# Patient Record
Sex: Male | Born: 1940
Health system: Southern US, Community
[De-identification: ages and names within clinical notes are randomized; demographics above are authoritative.]

## PROBLEM LIST (undated history)

## (undated) DIAGNOSIS — E7800 Pure hypercholesterolemia, unspecified: Secondary | ICD-10-CM

## (undated) DIAGNOSIS — F329 Major depressive disorder, single episode, unspecified: Secondary | ICD-10-CM

## (undated) DIAGNOSIS — E039 Hypothyroidism, unspecified: Secondary | ICD-10-CM

## (undated) DIAGNOSIS — I10 Essential (primary) hypertension: Secondary | ICD-10-CM

## (undated) DIAGNOSIS — K219 Gastro-esophageal reflux disease without esophagitis: Secondary | ICD-10-CM

## (undated) DIAGNOSIS — F32A Depression, unspecified: Secondary | ICD-10-CM

## (undated) HISTORY — PX: HEMORRHOID SURGERY: SHX153

---

## 2011-08-28 ENCOUNTER — Emergency Department (HOSPITAL_COMMUNITY): Payer: Medicare Other

## 2011-08-28 ENCOUNTER — Encounter: Payer: Self-pay | Admitting: Emergency Medicine

## 2011-08-28 ENCOUNTER — Emergency Department (HOSPITAL_COMMUNITY)
Admission: EM | Admit: 2011-08-28 | Discharge: 2011-08-29 | Disposition: A | Discharge status: Home / Self Care | Payer: Medicare Other | Attending: Emergency Medicine | Admitting: Emergency Medicine

## 2011-08-28 DIAGNOSIS — E7800 Pure hypercholesterolemia, unspecified: Secondary | ICD-10-CM | POA: Insufficient documentation

## 2011-08-28 DIAGNOSIS — S27329A Contusion of lung, unspecified, initial encounter: Secondary | ICD-10-CM

## 2011-08-28 DIAGNOSIS — F3289 Other specified depressive episodes: Secondary | ICD-10-CM | POA: Insufficient documentation

## 2011-08-28 DIAGNOSIS — W19XXXA Unspecified fall, initial encounter: Secondary | ICD-10-CM

## 2011-08-28 DIAGNOSIS — Z79899 Other long term (current) drug therapy: Secondary | ICD-10-CM | POA: Insufficient documentation

## 2011-08-28 DIAGNOSIS — I10 Essential (primary) hypertension: Secondary | ICD-10-CM | POA: Insufficient documentation

## 2011-08-28 DIAGNOSIS — F329 Major depressive disorder, single episode, unspecified: Secondary | ICD-10-CM | POA: Insufficient documentation

## 2011-08-28 DIAGNOSIS — R079 Chest pain, unspecified: Secondary | ICD-10-CM | POA: Insufficient documentation

## 2011-08-28 DIAGNOSIS — S0990XA Unspecified injury of head, initial encounter: Secondary | ICD-10-CM | POA: Insufficient documentation

## 2011-08-28 DIAGNOSIS — R61 Generalized hyperhidrosis: Secondary | ICD-10-CM | POA: Insufficient documentation

## 2011-08-28 DIAGNOSIS — M542 Cervicalgia: Secondary | ICD-10-CM | POA: Insufficient documentation

## 2011-08-28 DIAGNOSIS — W11XXXA Fall on and from ladder, initial encounter: Secondary | ICD-10-CM | POA: Insufficient documentation

## 2011-08-28 DIAGNOSIS — R109 Unspecified abdominal pain: Secondary | ICD-10-CM | POA: Insufficient documentation

## 2011-08-28 DIAGNOSIS — E039 Hypothyroidism, unspecified: Secondary | ICD-10-CM | POA: Insufficient documentation

## 2011-08-28 DIAGNOSIS — Y93H2 Activity, gardening and landscaping: Secondary | ICD-10-CM | POA: Insufficient documentation

## 2011-08-28 HISTORY — DX: Pure hypercholesterolemia, unspecified: E78.00

## 2011-08-28 HISTORY — DX: Essential (primary) hypertension: I10

## 2011-08-28 HISTORY — DX: Major depressive disorder, single episode, unspecified: F32.9

## 2011-08-28 HISTORY — DX: Depression, unspecified: F32.A

## 2011-08-28 HISTORY — DX: Hypothyroidism, unspecified: E03.9

## 2011-08-28 LAB — CBC
HCT: 42.8 % (ref 39.0–52.0)
Hemoglobin: 13.2 g/dL (ref 13.0–17.0)
MCH: 27.6 pg (ref 26.0–34.0)
MCHC: 30.8 g/dL (ref 30.0–36.0)
MCV: 89.4 fL (ref 78.0–100.0)
Platelets: 150 10*3/uL (ref 150–400)
RBC: 4.79 MIL/uL (ref 4.22–5.81)
RDW: 13.7 % (ref 11.5–15.5)
WBC: 12.1 10*3/uL — ABNORMAL HIGH (ref 4.0–10.5)

## 2011-08-28 LAB — POCT I-STAT, CHEM 8
BUN: 21 mg/dL (ref 6–23)
Calcium, Ion: 1.14 mmol/L (ref 1.12–1.32)
Chloride: 101 mEq/L (ref 96–112)
Creatinine, Ser: 1.2 mg/dL (ref 0.50–1.35)
Glucose, Bld: 98 mg/dL (ref 70–99)
HCT: 45 % (ref 39.0–52.0)
Hemoglobin: 15.3 g/dL (ref 13.0–17.0)
Potassium: 3.5 mEq/L (ref 3.5–5.1)
Sodium: 139 mEq/L (ref 135–145)
TCO2: 27 mmol/L (ref 0–100)

## 2011-08-28 IMAGING — CR DG CHEST 1V PORT
1 series · 1 of 1 positions shown · non-contrast
Comparison: None.

CLINICAL DATA: Fall.

PORTABLE CHEST - 1 VIEW

[AP]
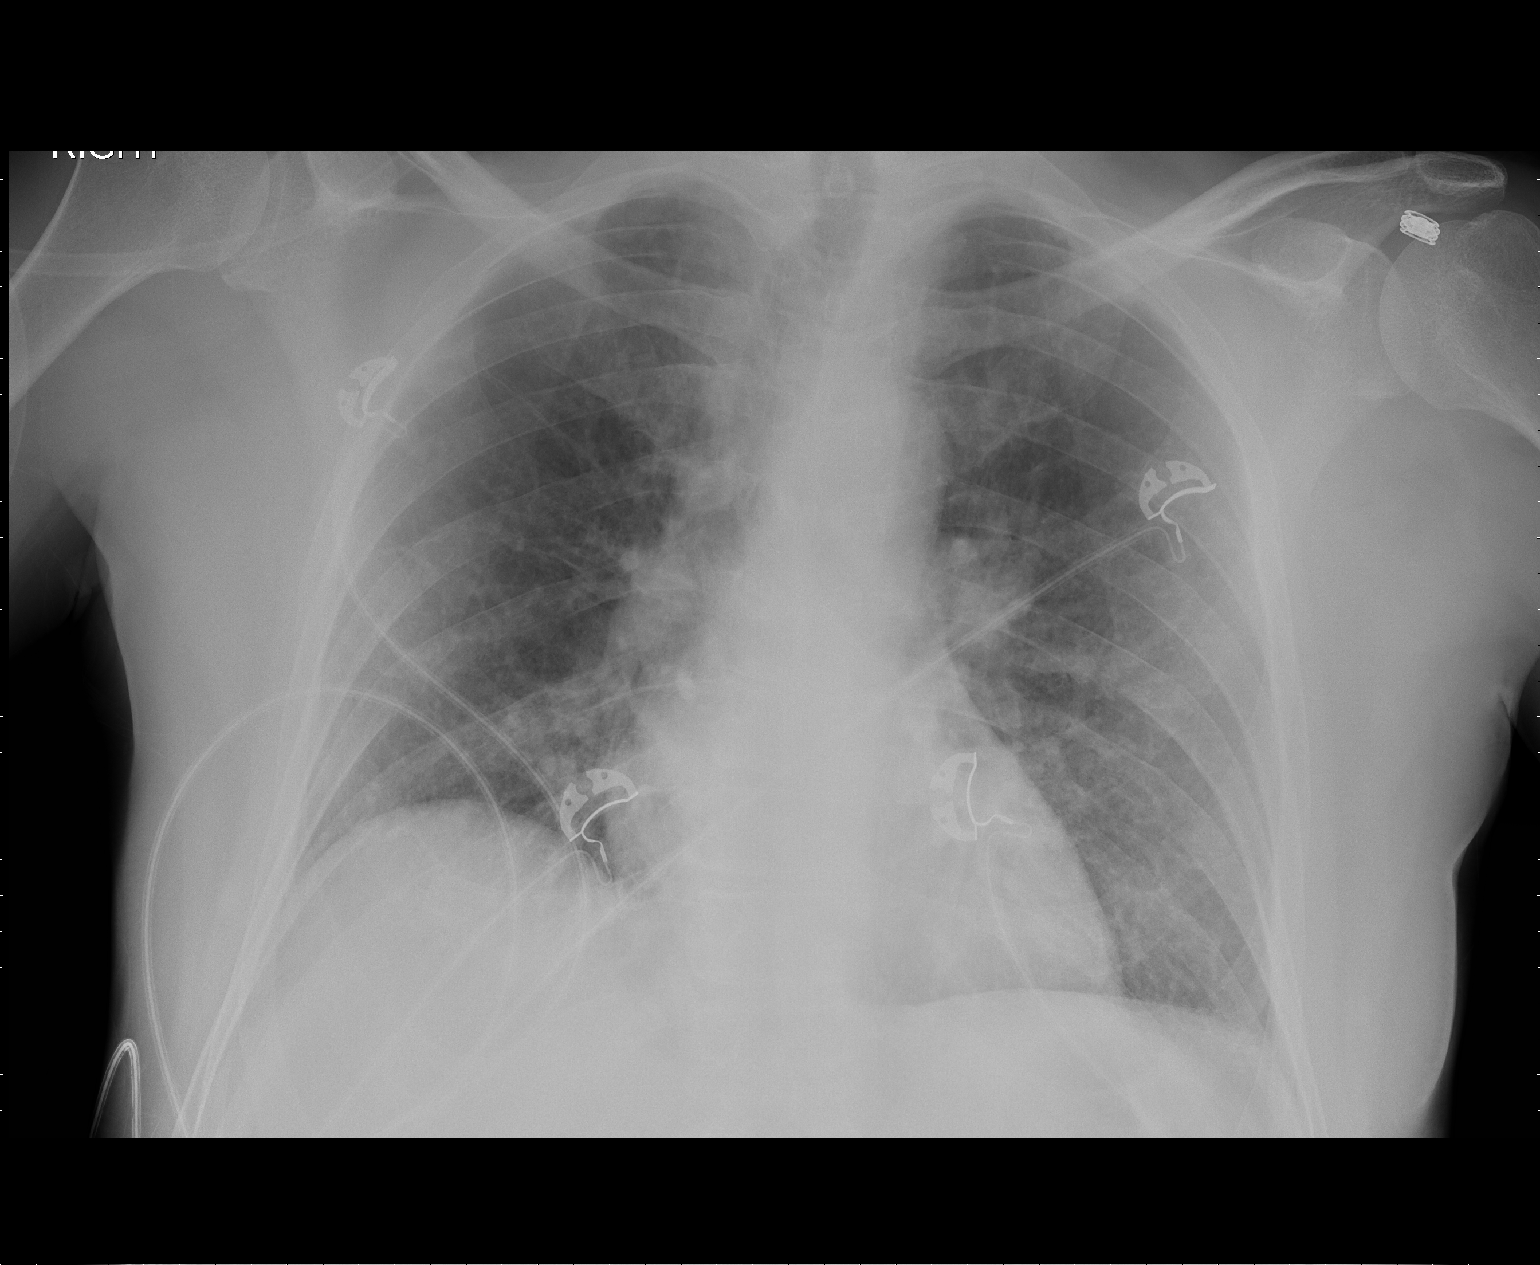

[1 of 1 positions shown; findings below may reference images not displayed]

FINDINGS: Lungs are clear.  Heart size is normal.  No pneumothorax
or effusion.
IMPRESSION: No acute disease.

## 2011-08-28 IMAGING — CT CT HEAD W/O CM
3 of 5 series · 15 of 47 positions shown, 18 images · non-contrast
Comparison: None

CT HEAD

CLINICAL DATA: The patient fell backwards, striking the back and
posterior head.  Possible loss of consciousness.  Back pain.

CT HEAD WITHOUT CONTRAST
CT CERVICAL SPINE WITHOUT CONTRAST
TECHNIQUE: Multidetector CT imaging of the head and cervical spine
was performed following the standard protocol without intravenous
contrast.  Multiplanar CT image reconstructions of the cervical
spine were also generated.

[Series 602: cor · coronal · 0.39mm/px · 3 of 58 slices shown]
[im 20/58  brain]
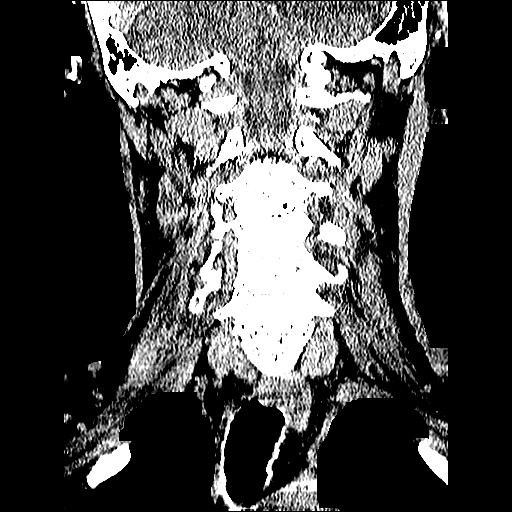
[im 26/58  brain]
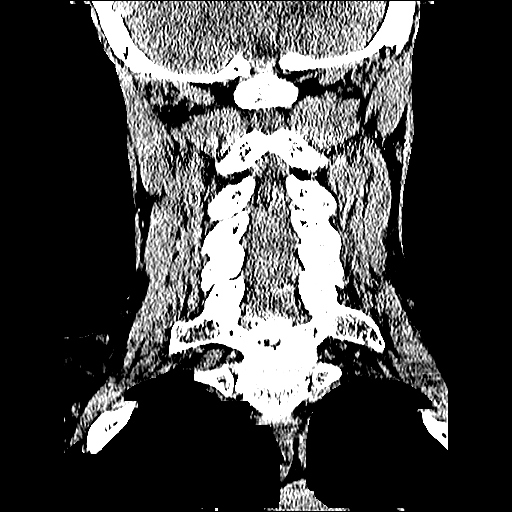
[im 32/58  brain]
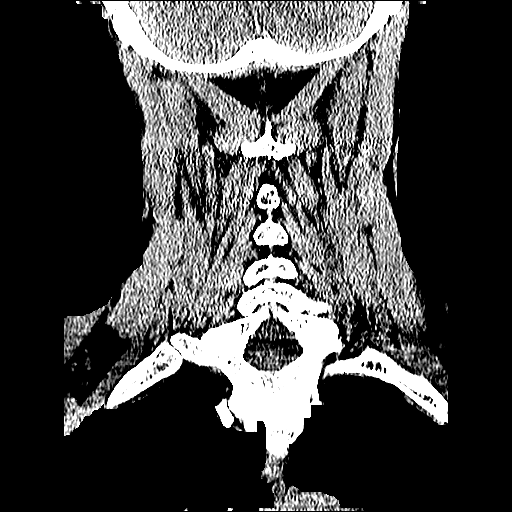

[Series 603: sag · sagittal · 0.39mm/px · 3 of 58 slices shown]
[im 20/58  brain]
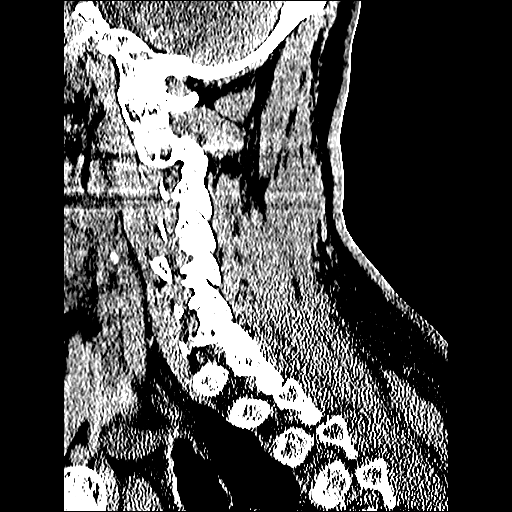
[im 29/58  brain]
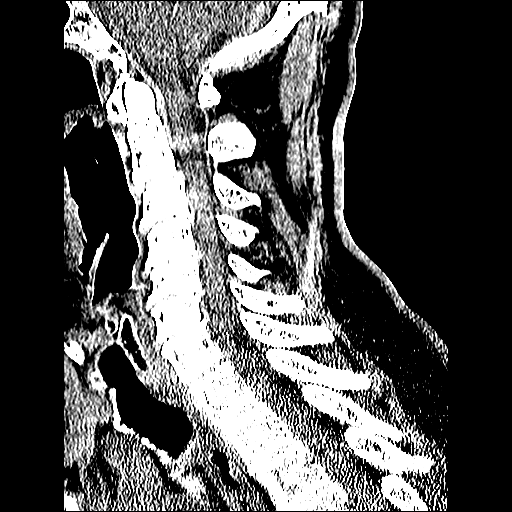
[im 39/58  brain]
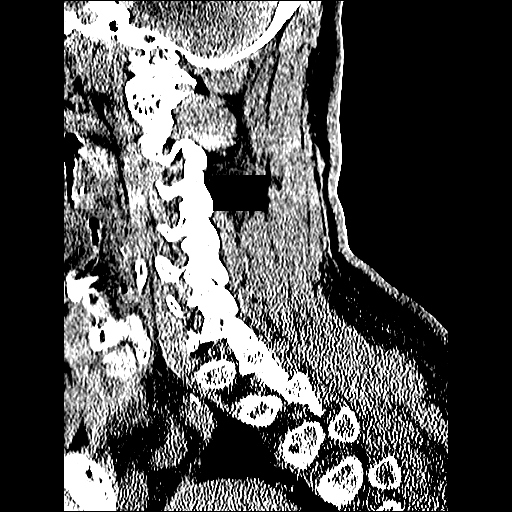

[Series 604: axial · axial · 0.39mm/px · z∈[-73,+66]mm · 9 of 91 slices shown, 12 images]
[im 9/91  brain]
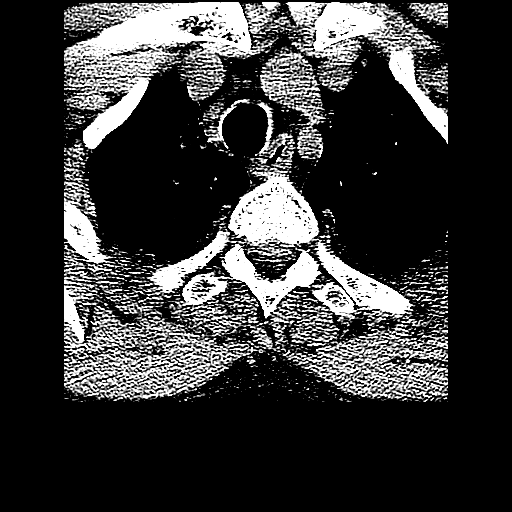
[im 9/91  bone]
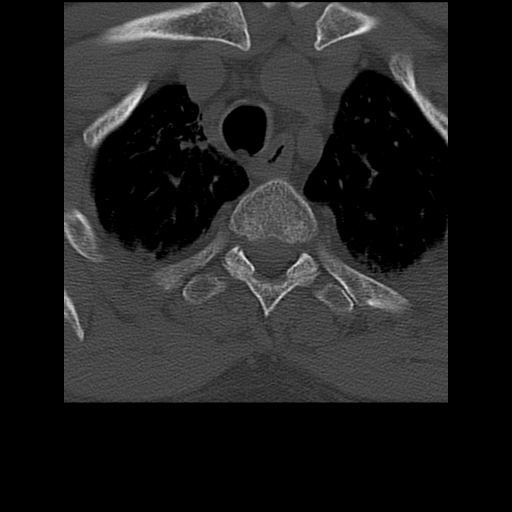
[im 17/91  brain]
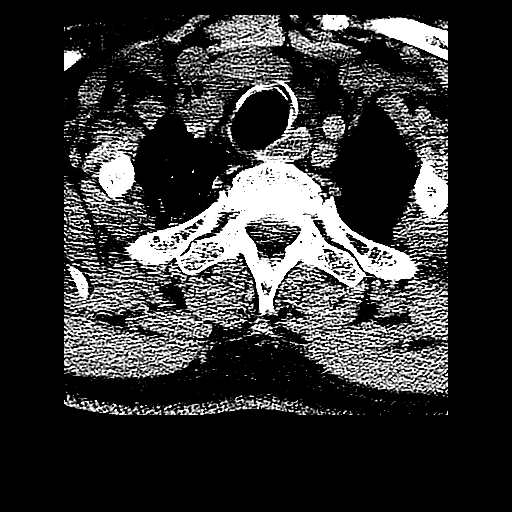
[im 25/91  brain]
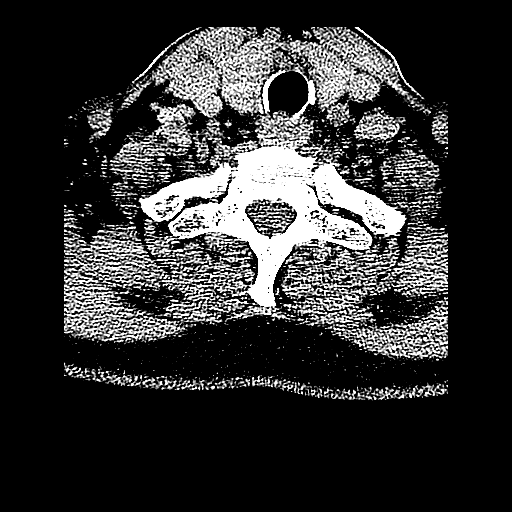
[im 33/91  brain]
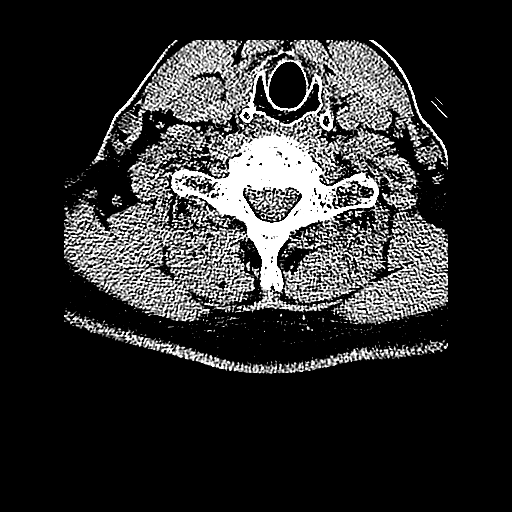
[im 50/91  brain]
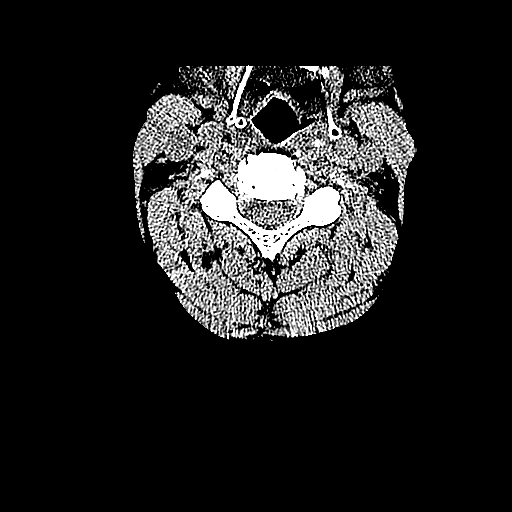
[im 50/91  bone]
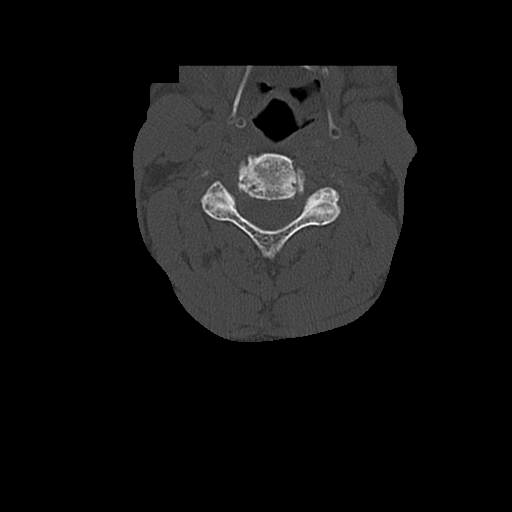
[im 58/91  brain]
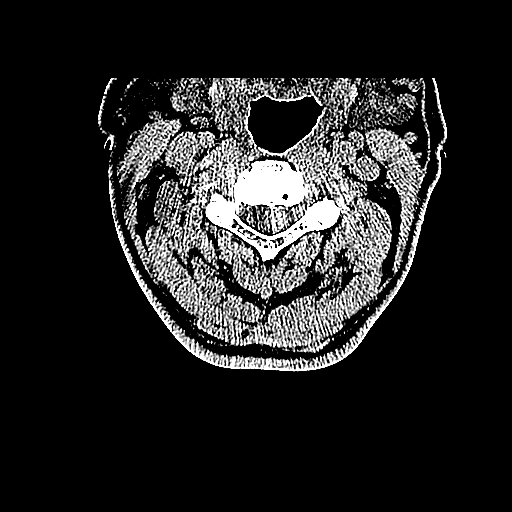
[im 66/91  brain]
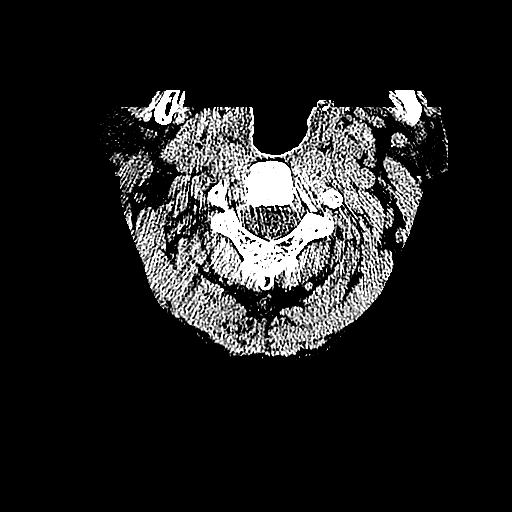
[im 74/91  brain]
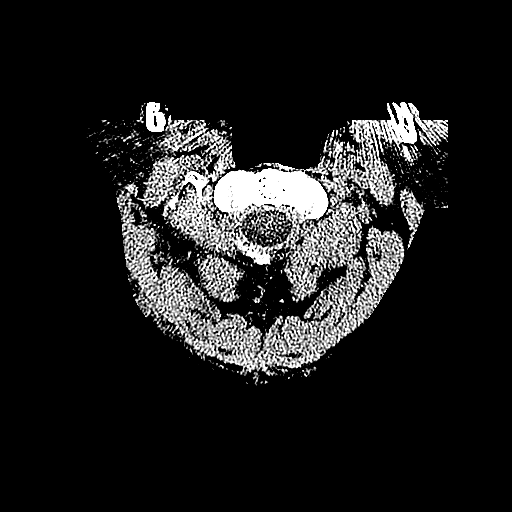
[im 82/91  brain]
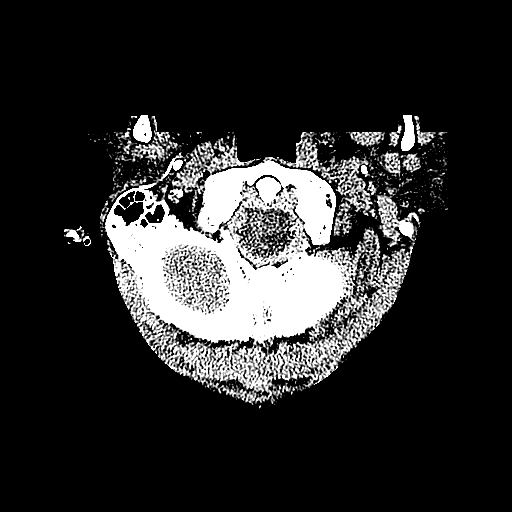
[im 82/91  bone]
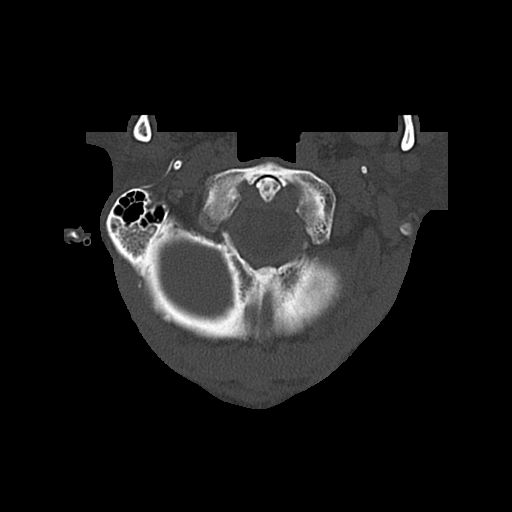

[15 of 47 positions shown; findings below may reference images not displayed]

FINDINGS: Mild cerebral atrophy.  Low attenuation changes in the
deep white matter suggesting small vessel ischemic change.  No
ventricular dilatation.  No mass effect or midline shift.  No
abnormal extra-axial fluid collections.  Gray-white matter
junctions are distinct.  Basal cisterns are not effaced.  No
evidence of acute intracranial hemorrhage.  Calcification in the
intracranial arteries.  No depressed skull fractures visualized.  5
mm calcification along the inner table of the left frontal skull
may represent a small meningioma or bone hypertroph.  Visualized
paranasal sinuses and mastoid air cells are not opacified.
IMPRESSION: Chronic atrophy and small vessel ischemic changes.  No evidence of
acute intracranial hemorrhage, mass lesion, or acute infarct.

CT CERVICAL SPINE
FINDINGS: Degenerative changes in the cervical spine with narrowed
cervical interspaces and endplate hypertrophy at C3-4, C4-5, C5-6,
and C6-7 levels.  Mild loss of cervical lordosis at these levels,
likely degenerative.  No abnormal anterior subluxation.  The
posterior elements and facet joints are well-aligned.  Lateral
masses of C1 are symmetrical.  The odontoid process appears intact.
No vertebral compression deformities.  No prevertebral soft tissue
swelling.  No significant paraspinal soft tissue infiltration.
Bone cortex and trabecular architecture appear intact. Vascular
calcifications in the carotid arteries. Interstitial changes in the
lung apices which may represent scarring or edema.  Prominent
thickening of the wall of the visualized upper esophagus which
might represent changes of esophagitis.
IMPRESSION: Degenerative changes in the cervical spine.  No displaced fractures
identified.  The interstitial fibrosis or edema in the lung apices.
Esophageal wall thickening suggesting esophagitis.

## 2011-08-28 IMAGING — CT CT CHEST W/ CM
2 of 5 series · 16 of 46 positions shown, 18 images · IV contrast (APPLIED)
Comparison: None

CT CHEST

CLINICAL DATA: Trauma.  The patient fell backwards, hitting his
back in the posterior head.  Complains of back pain.

CT CHEST, ABDOMEN AND PELVIS WITH CONTRAST
TECHNIQUE: Multidetector CT imaging of the chest, abdomen and
pelvis was performed following the standard protocol during bolus
administration of intravenous contrast.
Contrast:  1 year mL [RL]

[Series 2: c/a/p 5.0 b31f · axial · 0.70mm/px · z∈[-606,-61]mm · 13 of 123 slices shown, 15 images]
[im 7/123  soft-tissue]
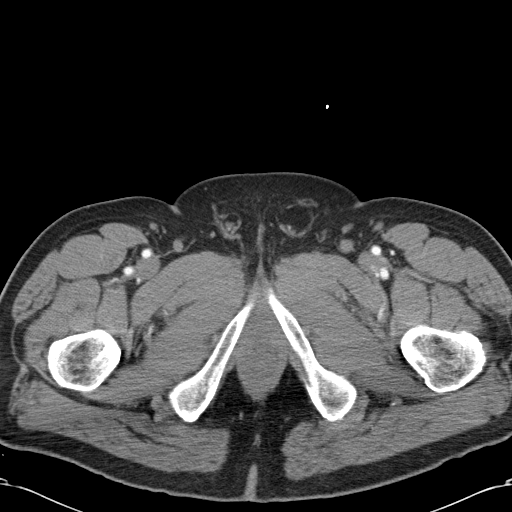
[im 7/123  bone]
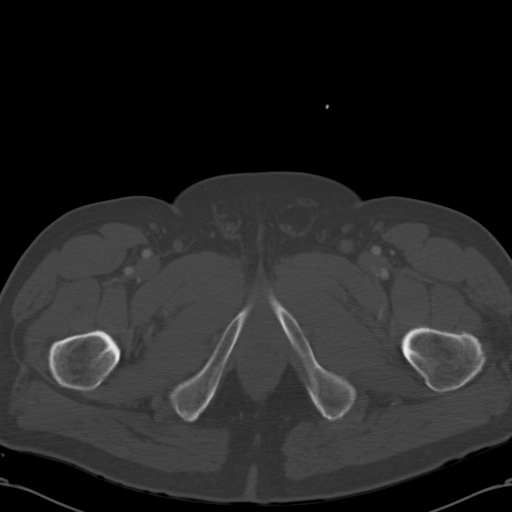
[im 14/123  soft-tissue]
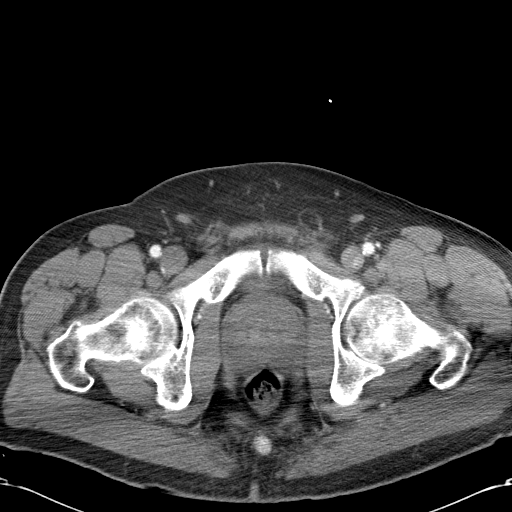
[im 28/123  soft-tissue]
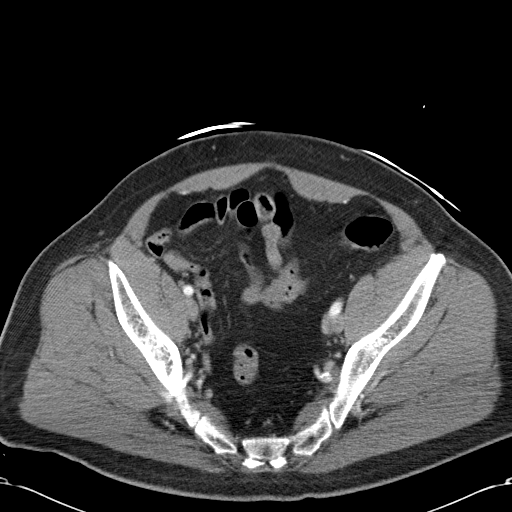
[im 34/123  soft-tissue]
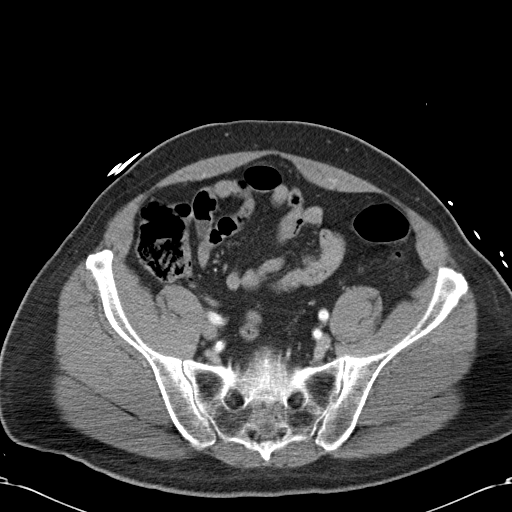
[im 41/123  soft-tissue]
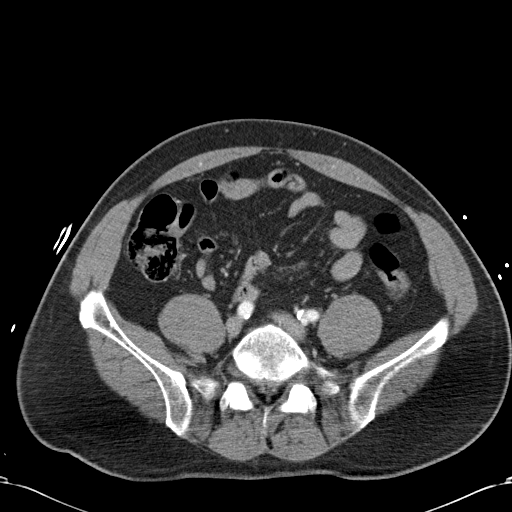
[im 55/123  soft-tissue]
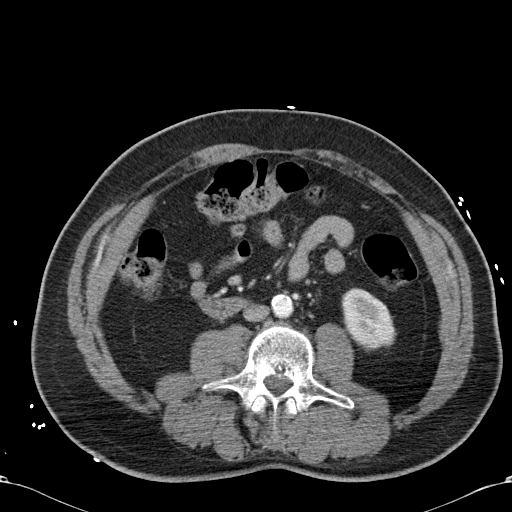
[im 62/123  soft-tissue]
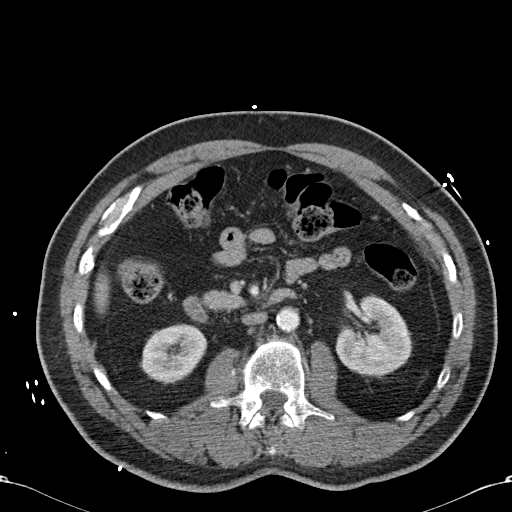
[im 68/123  soft-tissue]
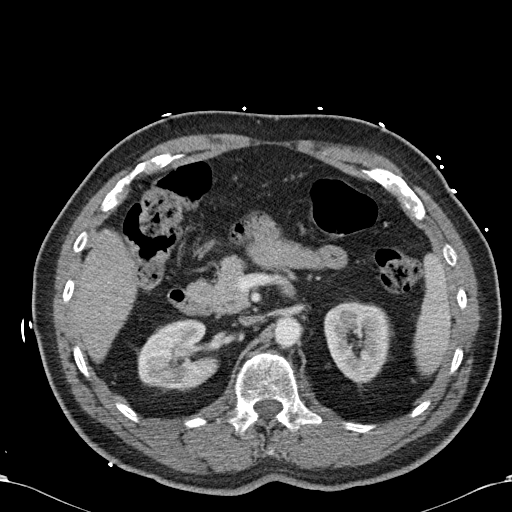
[im 82/123  soft-tissue]
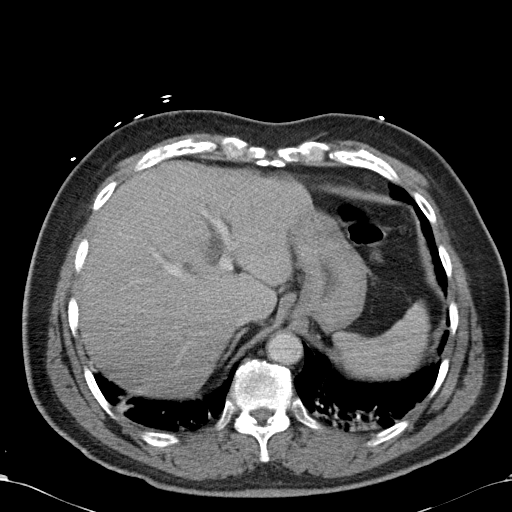
[im 82/123  bone]
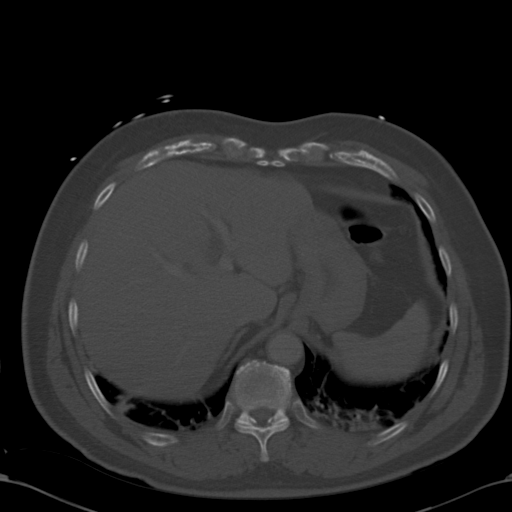
[im 89/123  soft-tissue]
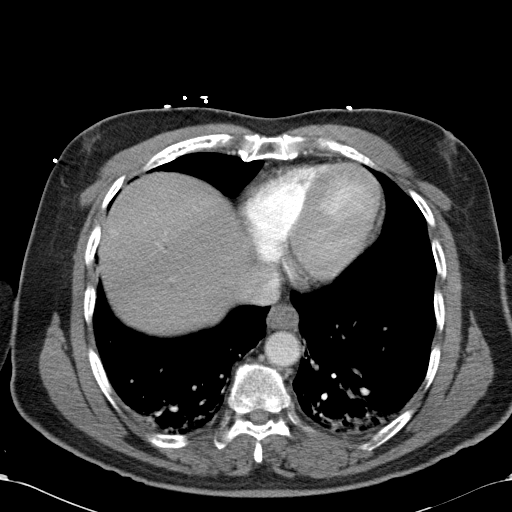
[im 95/123  soft-tissue]
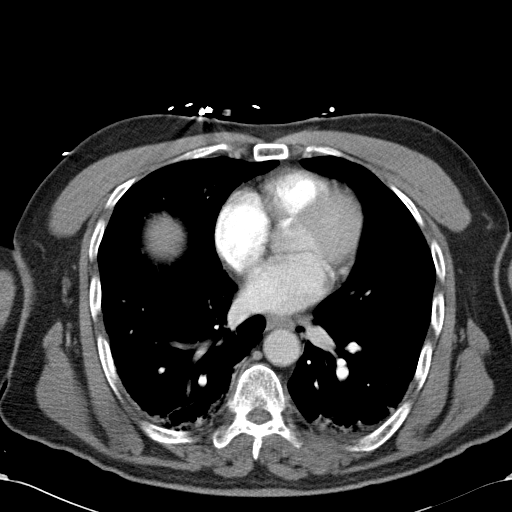
[im 109/123  soft-tissue]
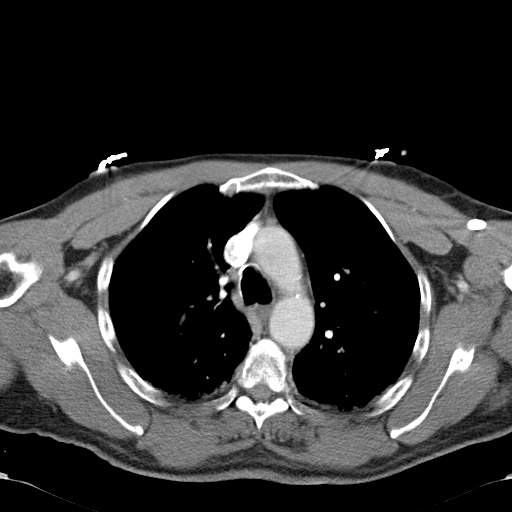
[im 116/123  soft-tissue]
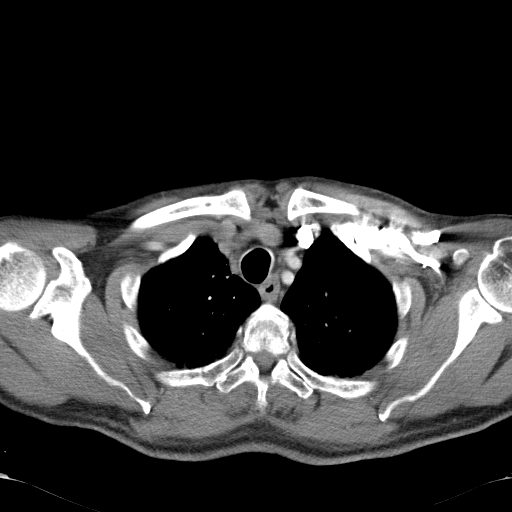

[Series 602: cor · coronal · 1.20mm/px · 3 of 128 slices shown]
[im 43/128  soft-tissue]
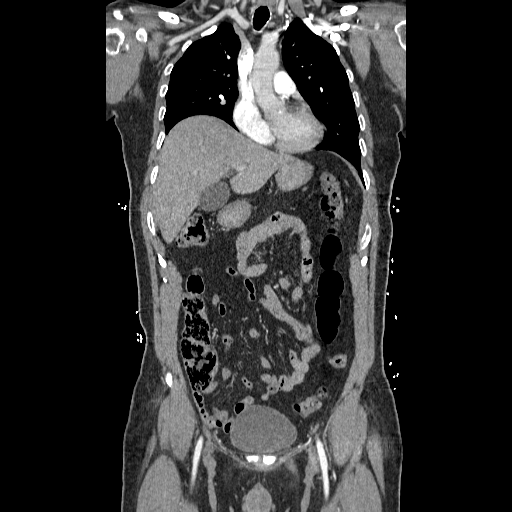
[im 57/128  soft-tissue]
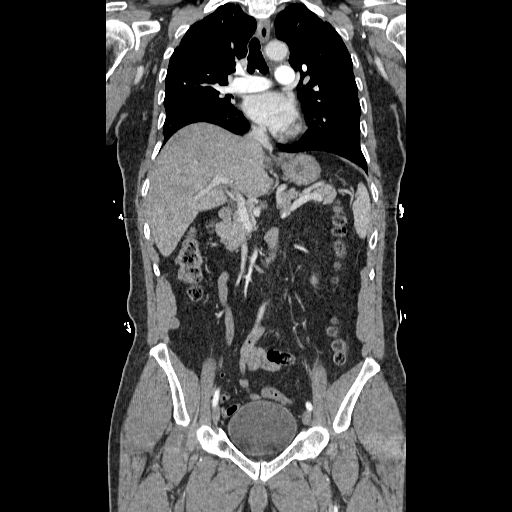
[im 71/128  soft-tissue]
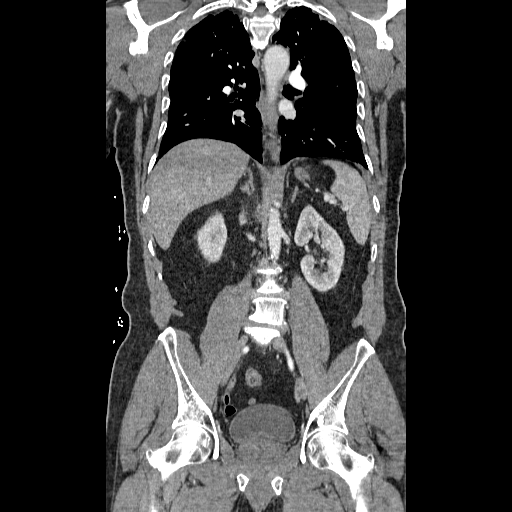

[16 of 46 positions shown; findings below may reference images not displayed]

FINDINGS: Normal caliber thoracic aorta with scattered
calcifications.  No evidence of dissection or aneurysm.  No
abnormal mediastinal fluid collections.  No significant
lymphadenopathy in the chest.  Calcification in the coronary
arteries.  No pleural effusions.  Dependent atelectasis versus
contusions in the posterior lungs.  Scattered emphysematous
changes.  Interstitial fibrosis in the apices and peripheral lungs.
Airways appear patent.  No focal airspace consolidation.  No
pneumothorax.  Normal alignment of the thoracic spine without
vertebral compression.  Mild degenerative hypertrophic changes.  No
sternal depression.  No displaced rib fractures identified. Diffuse
thickening of the esophageal wall, most prominent in the upper
esophagus suggesting esophagitis.
IMPRESSION: Contusion or atelectasis in the posterior lungs.  No pneumothorax.
Normal appearance of the aorta.  Thickening of the esophageal wall
suggesting inflammatory esophagitis.

CT ABDOMEN AND PELVIS
FINDINGS: Degenerative changes in the lumbar spine with normal
alignment.  No compression deformities.  The pelvis, sacrum, and
hips appear intact.

Mild intra and extrahepatic bile duct dilatation, with extrahepatic
bile ducts measuring up to about 12 mm diameter.  No radiopaque
stone or obstructing masses demonstrated.  Consider further
evaluation with non emergent MRCP if clinically indicated.  The
gallbladder is not significantly distended.  The liver, spleen,
pancreas, adrenal glands, kidneys, and retroperitoneal lymph nodes
are unremarkable.  Calcification of the aorta without aneurysm.  No
abnormal retroperitoneal fluid collections.  The stomach and small
bowel are decompressed.  Stool filled colon without distension or
wall thickening.  No free air or free fluid in the abdomen.

Pelvis:  Prostatic enlargement, measuring 4.8 x 5.8 cm.  No bladder
wall thickening.  Is at the appendix is normal.  No inflammatory
changes in the sigmoid colon.  No free or loculated pelvic fluid
collections.  Fat in the inguinal canals.
IMPRESSION: No acute post-traumatic changes demonstrated in the abdomen or
pelvis.  Mild bile duct dilatation of nonspecific etiology.
Consider follow-up with non emergent MRCP.  Prostatic enlargement.

## 2011-08-28 MED ORDER — IOHEXOL 300 MG/ML  SOLN
100.0000 mL | Freq: Once | INTRAMUSCULAR | Status: AC | PRN
  Administered 2011-08-28: 100 mL via INTRAVENOUS

## 2011-08-28 MED ORDER — HYDROMORPHONE HCL PF 1 MG/ML IJ SOLN
1.0000 mg | Freq: Once | INTRAMUSCULAR | Status: AC
  Administered 2011-08-28: 1 mg via INTRAVENOUS
  Filled 2011-08-28: qty 1

## 2011-08-28 MED ORDER — HYDROMORPHONE HCL 2 MG PO TABS: 1.0000 mg | ORAL_TABLET | ORAL | Status: AC | PRN

## 2011-08-28 NOTE — ED Notes (Signed)
Pt taking po contrast - advised to let nurse know when completed

## 2011-08-28 NOTE — ED Notes (Signed)
PO contrast completed -- CT notified

## 2011-08-28 NOTE — ED Notes (Signed)
Pt fell approx 10 feet off ladder while cutting branches from tree around 1630 today.  C/o pain to L side of back, L knee pain, L arm pain, pain to L side of head, and sob.

## 2011-08-28 NOTE — ED Provider Notes (Signed)
Medical screening examination/treatment/procedure(s) were conducted as a shared visit with non-physician practitioner(s) and myself.  I personally evaluated the patient during the encounter Patient with mechanical fall off a ladder and severe tenderness in the left posterior ribs and upper abdomen. Will get scan of head neck, chest abdomen and pelvis. All films negative other than mild pulmonary contusion however patient is taking deep breaths and oxygenating well. He lives with his wife who also to monitor him. He will return for any problems.  Gwyneth Sprout, MD 08/28/11 [phone]

## 2011-08-28 NOTE — ED Notes (Signed)
CT notified nurse that they require lab work for CT with contrast - MD notified and orders placed.

## 2011-08-28 NOTE — ED Provider Notes (Signed)
History     CSN: [id number] Arrival date & time: 08/28/2011  5:55 PM   First MD Initiated Contact with Patient 08/28/11 1828      Chief Complaint  Patient presents with  . Fall    (Consider location/radiation/quality/duration/timing/severity/associated sxs/prior treatment) The history is provided by the patient and the spouse.   the patient is a 70 year old male who presents status post a fall from a 10 foot tall ladder. He was cutting branches this afternoon and when he reached to his left he lost his footing and fell from the ladder, landing on his left side. [name] unsure of whether he lost consciousness at any point. His wife reports that when she came to them he was diaphoretic and groggy but quickly came around to his normal state. She complains of pain to the lower left side and left posterior ribs as well as pain in his neck. The pain does not radiate. It is severe and constant. It is worse with movement and palpation. There are no relieving factors. There has been no prior treatment.  Past Medical History  Diagnosis Date  . High cholesterol   . Hypothyroid   . Hypertension   . Depression     No past surgical history on file.  No family history on file.  History  Substance Use Topics  . Smoking status: Never Smoker   . Smokeless tobacco: Not on file  . Alcohol Use: No      Review of Systems  Constitutional: Positive for diaphoresis. Negative for fever and chills.  HENT: Positive for neck pain. Negative for ear pain, nosebleeds, facial swelling, neck stiffness, dental problem and tinnitus.   Respiratory: Negative for chest tightness and shortness of breath.   Cardiovascular: Negative for chest pain and palpitations.  Gastrointestinal: Negative for nausea, vomiting, abdominal pain and rectal pain.  Genitourinary: Negative for dysuria, hematuria and difficulty urinating.       No incontinence.   Musculoskeletal: Negative for back pain, joint swelling and gait  problem.       Positive left side pain.   Skin: Negative for rash and wound.  Neurological: Negative for dizziness, seizures, syncope, weakness and light-headedness.       Negative for perineum numbness  Hematological: Does not bruise/bleed easily.    Allergies  Oxycodone  Home Medications   Current Outpatient Rx  Name Route Sig Dispense Refill  . ATORVASTATIN CALCIUM 10 MG PO TABS Oral Take 10 mg by mouth daily.      . ENALAPRIL MALEATE PO Oral Take 1 tablet by mouth daily.      Marland Kitchen HYDROCHLOROTHIAZIDE PO Oral Take 1 tablet by mouth daily.      Marland Kitchen LEVOTHYROXINE SODIUM PO Oral Take 1 tablet by mouth daily.        BP 152/66  Pulse 75  Temp(Src) 97.5 F (36.4 C) (Oral)  Resp 16  SpO2 99%  Physical Exam  Nursing note and vitals reviewed. Constitutional: [name] oriented to person, place, and time. He appears well-developed and well-nourished.       Uncomfortable appearance  HENT:  Head: Normocephalic and atraumatic.  Right Ear: External ear normal.  Left Ear: External ear normal.  Mouth/Throat: Oropharynx is clear and moist.  Eyes: Conjunctivae and EOM are normal. Pupils are equal, round, and reactive to light.  Neck: Normal range of motion. Neck supple.       Mild paracervical TTP without bony TTP or deformity  Cardiovascular: Normal rate, regular rhythm, normal heart sounds  and intact distal pulses.   No murmur heard. Pulmonary/Chest: Effort normal and breath sounds normal. No respiratory distress. He has no wheezes. He exhibits tenderness.       Significant TTP to lower left lateral and posterior ribs  Abdominal: Soft. Bowel sounds are normal. He exhibits no distension. There is no guarding.       Mild TTP to upper left side/abdomen  Musculoskeletal: Normal range of motion. He exhibits no edema and no tenderness.       Tenderness to ribs/chest wall as noted above. Pelvis stable without tenderness. Calcaneus without TTP bilaterally.  Lymphadenopathy:    He has no cervical  adenopathy.  Neurological: [name] alert and oriented to person, place, and time. No cranial nerve deficit. Coordination normal.  Skin: Skin is warm and dry. No rash noted.       No laceration or abrasion, no ecchymosis  Psychiatric: His behavior is normal.    ED Course  Procedures (including critical care time)  Labs Reviewed  CBC - Abnormal; Notable for the following:    WBC 12.1 (*)    All other components within normal limits  POCT I-STAT, CHEM 8  I-STAT, CHEM 8   Ct Head Wo Contrast  08/28/2011  *RADIOLOGY REPORT*  Clinical Data:  The patient fell backwards, striking the back and posterior head.  Possible loss of consciousness.  Back pain.  CT HEAD WITHOUT CONTRAST CT CERVICAL SPINE WITHOUT CONTRAST  Technique:  Multidetector CT imaging of the head and cervical spine was performed following the standard protocol without intravenous contrast.  Multiplanar CT image reconstructions of the cervical spine were also generated.  Comparison:  None  CT HEAD  Findings: Mild cerebral atrophy.  Low attenuation changes in the deep white matter suggesting small vessel ischemic change.  No ventricular dilatation.  No mass effect or midline shift.  No abnormal extra-axial fluid collections.  Gray-white matter junctions are distinct.  Basal cisterns are not effaced.  No evidence of acute intracranial hemorrhage.  Calcification in the intracranial arteries.  No depressed skull fractures visualized.  5 mm calcification along the inner table of the left frontal skull may represent a small meningioma or bone hypertroph.  Visualized paranasal sinuses and mastoid air cells are not opacified.  IMPRESSION: Chronic atrophy and small vessel ischemic changes.  No evidence of acute intracranial hemorrhage, mass lesion, or acute infarct.  CT CERVICAL SPINE  Findings: Degenerative changes in the cervical spine with narrowed cervical interspaces and endplate hypertrophy at C3-4, C4-5, C5-6, and C6-7 levels.  Mild loss of  cervical lordosis at these levels, likely degenerative.  No abnormal anterior subluxation.  The posterior elements and facet joints are well-aligned.  Lateral masses of C1 are symmetrical.  The odontoid process appears intact. No vertebral compression deformities.  No prevertebral soft tissue swelling.  No significant paraspinal soft tissue infiltration. Bone cortex and trabecular architecture appear intact. Vascular calcifications in the carotid arteries. Interstitial changes in the lung apices which may represent scarring or edema.  Prominent thickening of the wall of the visualized upper esophagus which might represent changes of esophagitis.  IMPRESSION: Degenerative changes in the cervical spine.  No displaced fractures identified.  The interstitial fibrosis or edema in the lung apices. Esophageal wall thickening suggesting esophagitis.  Original Report Authenticated By: Marlon Pel, M.D.   Ct Chest W Contrast  08/28/2011  *RADIOLOGY REPORT*  Clinical Data:  Trauma.  The patient fell backwards, hitting his back in the posterior head.  Complains of  back pain.  CT CHEST, ABDOMEN AND PELVIS WITH CONTRAST  Technique:  Multidetector CT imaging of the chest, abdomen and pelvis was performed following the standard protocol during bolus administration of intravenous contrast.  Contrast:  1 year mL Omnipaque-300  Comparison:  None  CT CHEST  Findings:  Normal caliber thoracic aorta with scattered calcifications.  No evidence of dissection or aneurysm.  No abnormal mediastinal fluid collections.  No significant lymphadenopathy in the chest.  Calcification in the coronary arteries.  No pleural effusions.  Dependent atelectasis versus contusions in the posterior lungs.  Scattered emphysematous changes.  Interstitial fibrosis in the apices and peripheral lungs. Airways appear patent.  No focal airspace consolidation.  No pneumothorax.  Normal alignment of the thoracic spine without vertebral compression.  Mild  degenerative hypertrophic changes.  No sternal depression.  No displaced rib fractures identified. Diffuse thickening of the esophageal wall, most prominent in the upper esophagus suggesting esophagitis.  IMPRESSION: Contusion or atelectasis in the posterior lungs.  No pneumothorax. Normal appearance of the aorta.  Thickening of the esophageal wall suggesting inflammatory esophagitis.  CT ABDOMEN AND PELVIS  Findings:  Degenerative changes in the lumbar spine with normal alignment.  No compression deformities.  The pelvis, sacrum, and hips appear intact.  Mild intra and extrahepatic bile duct dilatation, with extrahepatic bile ducts measuring up to about 12 mm diameter.  No radiopaque stone or obstructing masses demonstrated.  Consider further evaluation with non emergent MRCP if clinically indicated.  The gallbladder is not significantly distended.  The liver, spleen, pancreas, adrenal glands, kidneys, and retroperitoneal lymph nodes are unremarkable.  Calcification of the aorta without aneurysm.  No abnormal retroperitoneal fluid collections.  The stomach and small bowel are decompressed.  Stool filled colon without distension or wall thickening.  No free air or free fluid in the abdomen.  Pelvis:  Prostatic enlargement, measuring 4.8 x 5.8 cm.  No bladder wall thickening.  Is at the appendix is normal.  No inflammatory changes in the sigmoid colon.  No free or loculated pelvic fluid collections.  Fat in the inguinal canals.  IMPRESSION: No acute post-traumatic changes demonstrated in the abdomen or pelvis.  Mild bile duct dilatation of nonspecific etiology. Consider follow-up with non emergent MRCP.  Prostatic enlargement.  Original Report Authenticated By: Marlon Pel, M.D.   Ct Cervical Spine Wo Contrast  08/28/2011  *RADIOLOGY REPORT*  Clinical Data:  The patient fell backwards, striking the back and posterior head.  Possible loss of consciousness.  Back pain.  CT HEAD WITHOUT CONTRAST CT CERVICAL  SPINE WITHOUT CONTRAST  Technique:  Multidetector CT imaging of the head and cervical spine was performed following the standard protocol without intravenous contrast.  Multiplanar CT image reconstructions of the cervical spine were also generated.  Comparison:  None  CT HEAD  Findings: Mild cerebral atrophy.  Low attenuation changes in the deep white matter suggesting small vessel ischemic change.  No ventricular dilatation.  No mass effect or midline shift.  No abnormal extra-axial fluid collections.  Gray-white matter junctions are distinct.  Basal cisterns are not effaced.  No evidence of acute intracranial hemorrhage.  Calcification in the intracranial arteries.  No depressed skull fractures visualized.  5 mm calcification along the inner table of the left frontal skull may represent a small meningioma or bone hypertroph.  Visualized paranasal sinuses and mastoid air cells are not opacified.  IMPRESSION: Chronic atrophy and small vessel ischemic changes.  No evidence of acute intracranial hemorrhage, mass lesion, or  acute infarct.  CT CERVICAL SPINE  Findings: Degenerative changes in the cervical spine with narrowed cervical interspaces and endplate hypertrophy at C3-4, C4-5, C5-6, and C6-7 levels.  Mild loss of cervical lordosis at these levels, likely degenerative.  No abnormal anterior subluxation.  The posterior elements and facet joints are well-aligned.  Lateral masses of C1 are symmetrical.  The odontoid process appears intact. No vertebral compression deformities.  No prevertebral soft tissue swelling.  No significant paraspinal soft tissue infiltration. Bone cortex and trabecular architecture appear intact. Vascular calcifications in the carotid arteries. Interstitial changes in the lung apices which may represent scarring or edema.  Prominent thickening of the wall of the visualized upper esophagus which might represent changes of esophagitis.  IMPRESSION: Degenerative changes in the cervical spine.   No displaced fractures identified.  The interstitial fibrosis or edema in the lung apices. Esophageal wall thickening suggesting esophagitis.  Original Report Authenticated By: Marlon Pel, M.D.   Ct Abdomen Pelvis W Contrast  08/28/2011  *RADIOLOGY REPORT*  Clinical Data:  Trauma.  The patient fell backwards, hitting his back in the posterior head.  Complains of back pain.  CT CHEST, ABDOMEN AND PELVIS WITH CONTRAST  Technique:  Multidetector CT imaging of the chest, abdomen and pelvis was performed following the standard protocol during bolus administration of intravenous contrast.  Contrast:  1 year mL Omnipaque-300  Comparison:  None  CT CHEST  Findings:  Normal caliber thoracic aorta with scattered calcifications.  No evidence of dissection or aneurysm.  No abnormal mediastinal fluid collections.  No significant lymphadenopathy in the chest.  Calcification in the coronary arteries.  No pleural effusions.  Dependent atelectasis versus contusions in the posterior lungs.  Scattered emphysematous changes.  Interstitial fibrosis in the apices and peripheral lungs. Airways appear patent.  No focal airspace consolidation.  No pneumothorax.  Normal alignment of the thoracic spine without vertebral compression.  Mild degenerative hypertrophic changes.  No sternal depression.  No displaced rib fractures identified. Diffuse thickening of the esophageal wall, most prominent in the upper esophagus suggesting esophagitis.  IMPRESSION: Contusion or atelectasis in the posterior lungs.  No pneumothorax. Normal appearance of the aorta.  Thickening of the esophageal wall suggesting inflammatory esophagitis.  CT ABDOMEN AND PELVIS  Findings:  Degenerative changes in the lumbar spine with normal alignment.  No compression deformities.  The pelvis, sacrum, and hips appear intact.  Mild intra and extrahepatic bile duct dilatation, with extrahepatic bile ducts measuring up to about 12 mm diameter.  No radiopaque stone or  obstructing masses demonstrated.  Consider further evaluation with non emergent MRCP if clinically indicated.  The gallbladder is not significantly distended.  The liver, spleen, pancreas, adrenal glands, kidneys, and retroperitoneal lymph nodes are unremarkable.  Calcification of the aorta without aneurysm.  No abnormal retroperitoneal fluid collections.  The stomach and small bowel are decompressed.  Stool filled colon without distension or wall thickening.  No free air or free fluid in the abdomen.  Pelvis:  Prostatic enlargement, measuring 4.8 x 5.8 cm.  No bladder wall thickening.  Is at the appendix is normal.  No inflammatory changes in the sigmoid colon.  No free or loculated pelvic fluid collections.  Fat in the inguinal canals.  IMPRESSION: No acute post-traumatic changes demonstrated in the abdomen or pelvis.  Mild bile duct dilatation of nonspecific etiology. Consider follow-up with non emergent MRCP.  Prostatic enlargement.  Original Report Authenticated By: Marlon Pel, M.D.   Dg Chest Portable 1 View  08/28/2011  *RADIOLOGY REPORT*  Clinical Data: Fall.  PORTABLE CHEST - 1 VIEW  Comparison: None.  Findings: Lungs are clear.  Heart size is normal.  No pneumothorax or effusion.  IMPRESSION: No acute disease.  Original Report Authenticated By: Bernadene Bell. Maricela Curet, M.D.     Diagnosis #1: Fall Diagnosis #2: Lung contusion   MDM  6:35 PM Patient seen and evaluated. Initial history and physical examination complete. Workup initiated. Will continue to follow closely.    11:38 PM Patient's radiographic studies have been reviewed and show only lung contusions. I have discussed the results with the patient and his wife. He will be discharged home with pain medication. The patient's wife notes that they recently moved to the area and did not have a primary Dr. for him yet; I will put the resource guide with his discharge paperwork and have discussed with him primary care options in  [place].    Elwyn Reach [place], [place] 08/28/11 [phone]

## 2011-08-28 NOTE — ED Notes (Signed)
Pt returned from CT. - no c/o pain or discomfort at this time.

## 2015-03-31 DIAGNOSIS — E291 Testicular hypofunction: Secondary | ICD-10-CM | POA: Diagnosis not present

## 2015-05-04 DIAGNOSIS — E291 Testicular hypofunction: Secondary | ICD-10-CM | POA: Diagnosis not present

## 2015-06-15 DIAGNOSIS — Z23 Encounter for immunization: Secondary | ICD-10-CM | POA: Diagnosis not present

## 2015-06-15 DIAGNOSIS — R05 Cough: Secondary | ICD-10-CM | POA: Diagnosis not present

## 2015-06-15 DIAGNOSIS — E782 Mixed hyperlipidemia: Secondary | ICD-10-CM | POA: Diagnosis not present

## 2015-06-15 DIAGNOSIS — I10 Essential (primary) hypertension: Secondary | ICD-10-CM | POA: Diagnosis not present

## 2015-06-15 DIAGNOSIS — E291 Testicular hypofunction: Secondary | ICD-10-CM | POA: Diagnosis not present

## 2015-07-15 DIAGNOSIS — E291 Testicular hypofunction: Secondary | ICD-10-CM | POA: Diagnosis not present

## 2015-08-17 DIAGNOSIS — E291 Testicular hypofunction: Secondary | ICD-10-CM | POA: Diagnosis not present

## 2015-09-16 DIAGNOSIS — E291 Testicular hypofunction: Secondary | ICD-10-CM | POA: Diagnosis not present

## 2015-10-19 DIAGNOSIS — E291 Testicular hypofunction: Secondary | ICD-10-CM | POA: Diagnosis not present

## 2015-11-17 DIAGNOSIS — K5900 Constipation, unspecified: Secondary | ICD-10-CM | POA: Diagnosis not present

## 2015-11-17 DIAGNOSIS — N419 Inflammatory disease of prostate, unspecified: Secondary | ICD-10-CM | POA: Diagnosis not present

## 2015-11-17 DIAGNOSIS — K649 Unspecified hemorrhoids: Secondary | ICD-10-CM | POA: Diagnosis not present

## 2015-11-17 DIAGNOSIS — E291 Testicular hypofunction: Secondary | ICD-10-CM | POA: Diagnosis not present

## 2015-11-17 DIAGNOSIS — N400 Enlarged prostate without lower urinary tract symptoms: Secondary | ICD-10-CM | POA: Diagnosis not present

## 2015-12-18 DIAGNOSIS — I10 Essential (primary) hypertension: Secondary | ICD-10-CM | POA: Diagnosis not present

## 2015-12-18 DIAGNOSIS — Z0000 Encounter for general adult medical examination without abnormal findings: Secondary | ICD-10-CM | POA: Diagnosis not present

## 2015-12-18 DIAGNOSIS — N400 Enlarged prostate without lower urinary tract symptoms: Secondary | ICD-10-CM | POA: Diagnosis not present

## 2015-12-18 DIAGNOSIS — Z1389 Encounter for screening for other disorder: Secondary | ICD-10-CM | POA: Diagnosis not present

## 2015-12-18 DIAGNOSIS — Z125 Encounter for screening for malignant neoplasm of prostate: Secondary | ICD-10-CM | POA: Diagnosis not present

## 2015-12-18 DIAGNOSIS — F329 Major depressive disorder, single episode, unspecified: Secondary | ICD-10-CM | POA: Diagnosis not present

## 2015-12-18 DIAGNOSIS — E7800 Pure hypercholesterolemia, unspecified: Secondary | ICD-10-CM | POA: Diagnosis not present

## 2015-12-18 DIAGNOSIS — E291 Testicular hypofunction: Secondary | ICD-10-CM | POA: Diagnosis not present

## 2016-01-18 DIAGNOSIS — E291 Testicular hypofunction: Secondary | ICD-10-CM | POA: Diagnosis not present

## 2016-02-13 ENCOUNTER — Emergency Department (HOSPITAL_COMMUNITY)
Admission: EM | Admit: 2016-02-13 | Discharge: 2016-02-13 | Disposition: A | Discharge status: Home / Self Care | Payer: Medicare PPO | Attending: Emergency Medicine | Admitting: Emergency Medicine

## 2016-02-13 ENCOUNTER — Encounter (HOSPITAL_COMMUNITY): Payer: Self-pay

## 2016-02-13 ENCOUNTER — Emergency Department (HOSPITAL_COMMUNITY): Payer: Medicare PPO

## 2016-02-13 DIAGNOSIS — S60141A Contusion of right ring finger with damage to nail, initial encounter: Secondary | ICD-10-CM | POA: Diagnosis not present

## 2016-02-13 DIAGNOSIS — Z8659 Personal history of other mental and behavioral disorders: Secondary | ICD-10-CM | POA: Insufficient documentation

## 2016-02-13 DIAGNOSIS — Y92094 Garage of other non-institutional residence as the place of occurrence of the external cause: Secondary | ICD-10-CM | POA: Insufficient documentation

## 2016-02-13 DIAGNOSIS — Z7982 Long term (current) use of aspirin: Secondary | ICD-10-CM | POA: Insufficient documentation

## 2016-02-13 DIAGNOSIS — S68613A Complete traumatic transphalangeal amputation of left middle finger, initial encounter: Secondary | ICD-10-CM | POA: Insufficient documentation

## 2016-02-13 DIAGNOSIS — S68115A Complete traumatic metacarpophalangeal amputation of left ring finger, initial encounter: Secondary | ICD-10-CM | POA: Diagnosis not present

## 2016-02-13 DIAGNOSIS — S60121A Contusion of right index finger with damage to nail, initial encounter: Secondary | ICD-10-CM | POA: Diagnosis not present

## 2016-02-13 DIAGNOSIS — Y9389 Activity, other specified: Secondary | ICD-10-CM | POA: Insufficient documentation

## 2016-02-13 DIAGNOSIS — S6992XA Unspecified injury of left wrist, hand and finger(s), initial encounter: Secondary | ICD-10-CM | POA: Diagnosis present

## 2016-02-13 DIAGNOSIS — W231XXA Caught, crushed, jammed, or pinched between stationary objects, initial encounter: Secondary | ICD-10-CM | POA: Diagnosis not present

## 2016-02-13 DIAGNOSIS — S60131A Contusion of right middle finger with damage to nail, initial encounter: Secondary | ICD-10-CM | POA: Insufficient documentation

## 2016-02-13 DIAGNOSIS — I10 Essential (primary) hypertension: Secondary | ICD-10-CM | POA: Insufficient documentation

## 2016-02-13 DIAGNOSIS — Y998 Other external cause status: Secondary | ICD-10-CM | POA: Insufficient documentation

## 2016-02-13 DIAGNOSIS — S67194A Crushing injury of right ring finger, initial encounter: Secondary | ICD-10-CM | POA: Diagnosis not present

## 2016-02-13 DIAGNOSIS — E7800 Pure hypercholesterolemia, unspecified: Secondary | ICD-10-CM | POA: Diagnosis not present

## 2016-02-13 DIAGNOSIS — E785 Hyperlipidemia, unspecified: Secondary | ICD-10-CM | POA: Diagnosis not present

## 2016-02-13 DIAGNOSIS — R011 Cardiac murmur, unspecified: Secondary | ICD-10-CM | POA: Diagnosis not present

## 2016-02-13 DIAGNOSIS — M79644 Pain in right finger(s): Secondary | ICD-10-CM | POA: Diagnosis not present

## 2016-02-13 DIAGNOSIS — S67190A Crushing injury of right index finger, initial encounter: Secondary | ICD-10-CM | POA: Insufficient documentation

## 2016-02-13 DIAGNOSIS — E039 Hypothyroidism, unspecified: Secondary | ICD-10-CM | POA: Insufficient documentation

## 2016-02-13 DIAGNOSIS — S68113A Complete traumatic metacarpophalangeal amputation of left middle finger, initial encounter: Secondary | ICD-10-CM | POA: Diagnosis not present

## 2016-02-13 DIAGNOSIS — Z79899 Other long term (current) drug therapy: Secondary | ICD-10-CM | POA: Diagnosis not present

## 2016-02-13 DIAGNOSIS — S68412A Complete traumatic amputation of left hand at wrist level, initial encounter: Secondary | ICD-10-CM | POA: Diagnosis not present

## 2016-02-13 DIAGNOSIS — S68615A Complete traumatic transphalangeal amputation of left ring finger, initial encounter: Secondary | ICD-10-CM | POA: Insufficient documentation

## 2016-02-13 DIAGNOSIS — S67195A Crushing injury of left ring finger, initial encounter: Secondary | ICD-10-CM | POA: Diagnosis not present

## 2016-02-13 DIAGNOSIS — S6720XA Crushing injury of unspecified hand, initial encounter: Secondary | ICD-10-CM

## 2016-02-13 DIAGNOSIS — S67193A Crushing injury of left middle finger, initial encounter: Secondary | ICD-10-CM | POA: Diagnosis not present

## 2016-02-13 IMAGING — CR DG HAND COMPLETE 3+V*L*
1 series · 1 of 1 positions shown · non-contrast
Comparison: None.

CLINICAL DATA: Fingers crushed in garage door, initial encounter

EXAM:
LEFT HAND - COMPLETE 3+ VIEW

[hand obl]
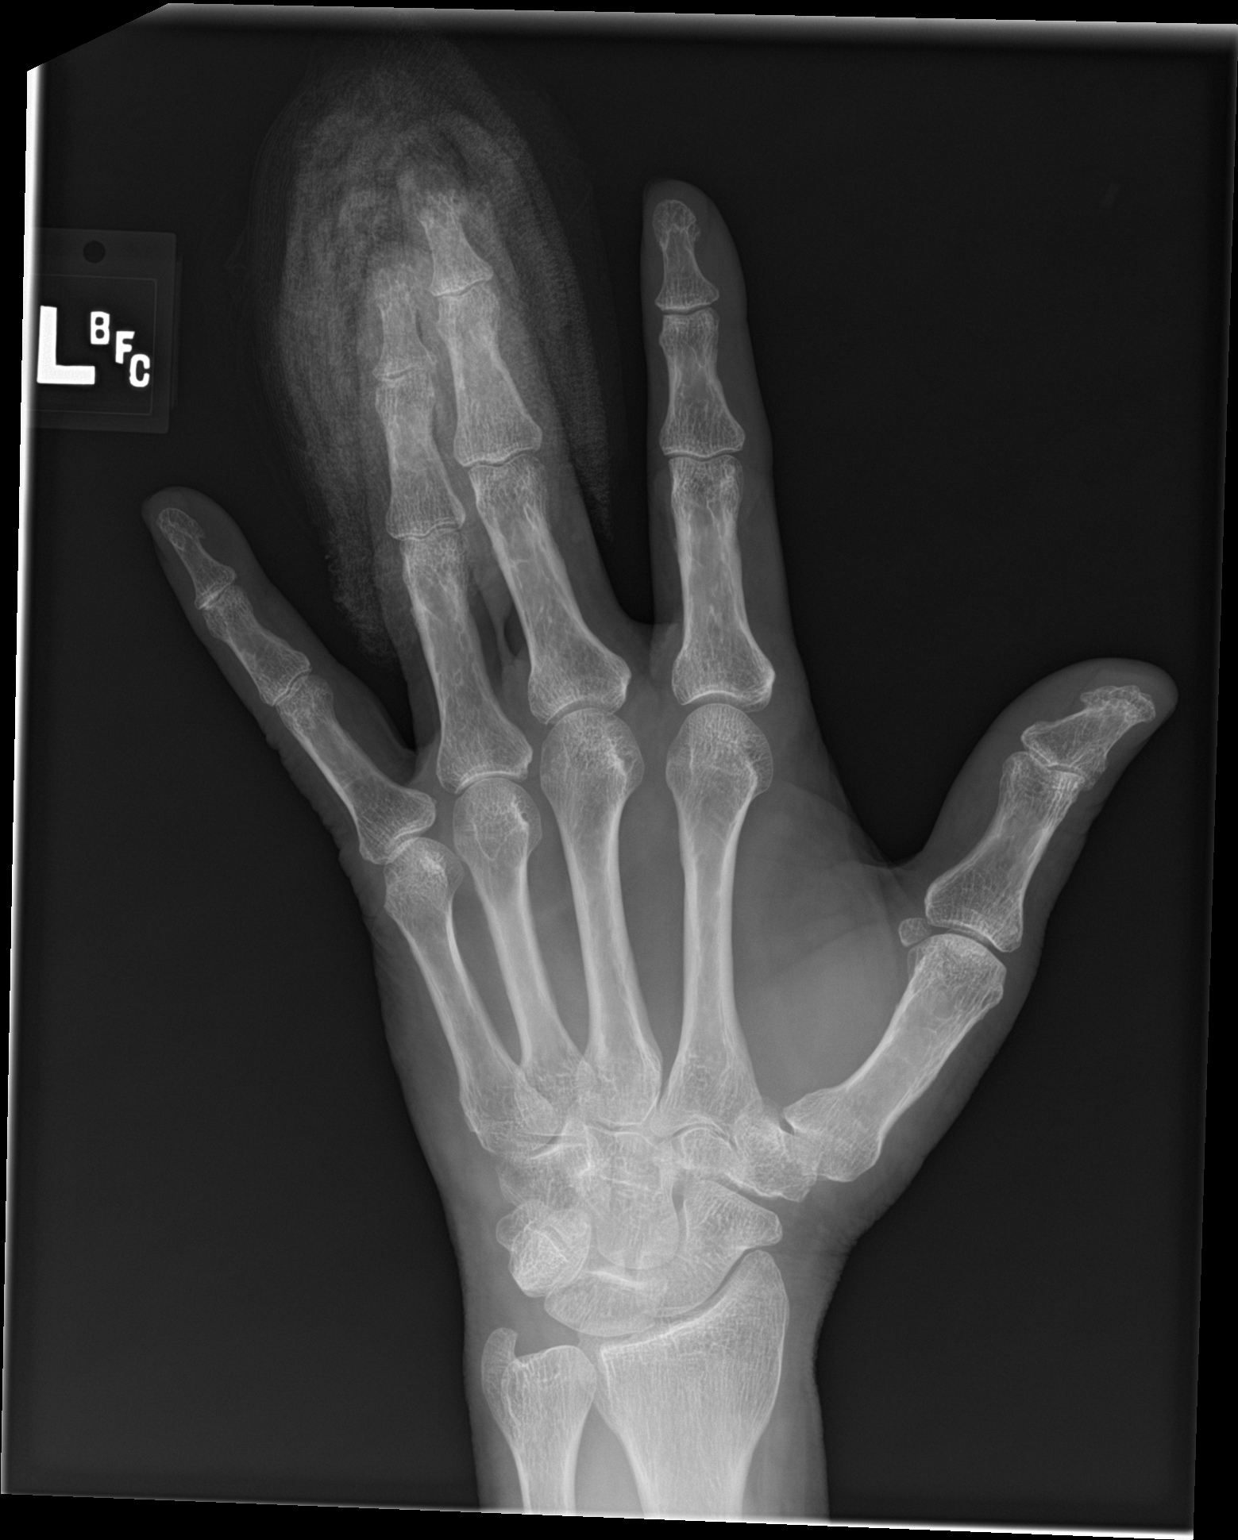

[1 of 1 positions shown; findings below may reference images not displayed]

FINDINGS: Soft tissue amputation is noted the distal aspects of the third and
fourth digits. Evaluation is somewhat limited due to overlying
dressing. Amputation of the third and fourth no other focal
abnormality is seen. Phalangeal [REDACTED] are noted as well
IMPRESSION: Distal amputation in the third and fourth digits.

## 2016-02-13 IMAGING — CR DG HAND COMPLETE 3+V*L*
1 series · 1 of 1 positions shown · non-contrast
Comparison: None.

CLINICAL DATA: Fingers crushed in garage door, initial encounter

EXAM:
LEFT HAND - COMPLETE 3+ VIEW

[hand pa]
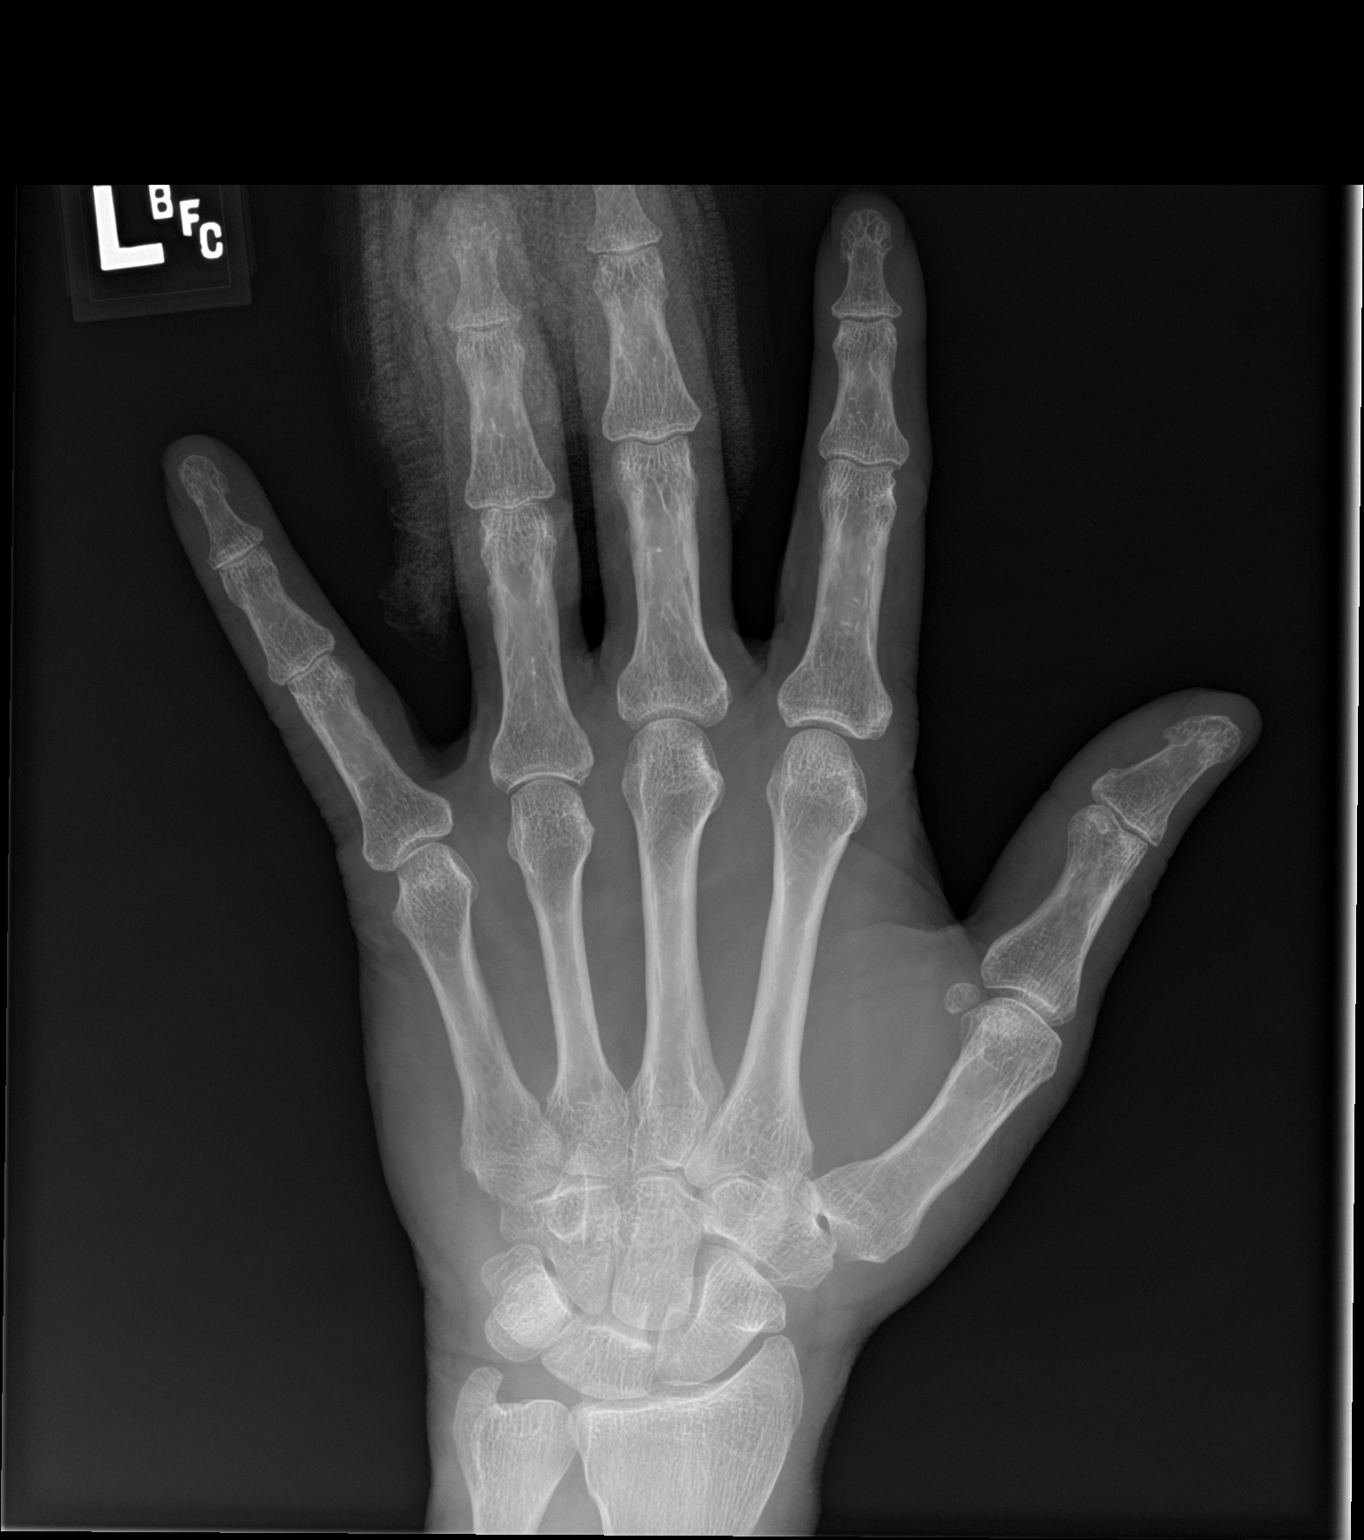

[1 of 1 positions shown; findings below may reference images not displayed]

FINDINGS: Soft tissue amputation is noted the distal aspects of the third and
fourth digits. Evaluation is somewhat limited due to overlying
dressing. Amputation of the third and fourth no other focal
abnormality is seen. Phalangeal [REDACTED] are noted as well
IMPRESSION: Distal amputation in the third and fourth digits.

## 2016-02-13 IMAGING — CR DG HAND COMPLETE 3+V*L*
1 series · 1 of 1 positions shown · non-contrast
Comparison: None.

CLINICAL DATA: Fingers crushed in garage door, initial encounter

EXAM:
LEFT HAND - COMPLETE 3+ VIEW

[hand lat]
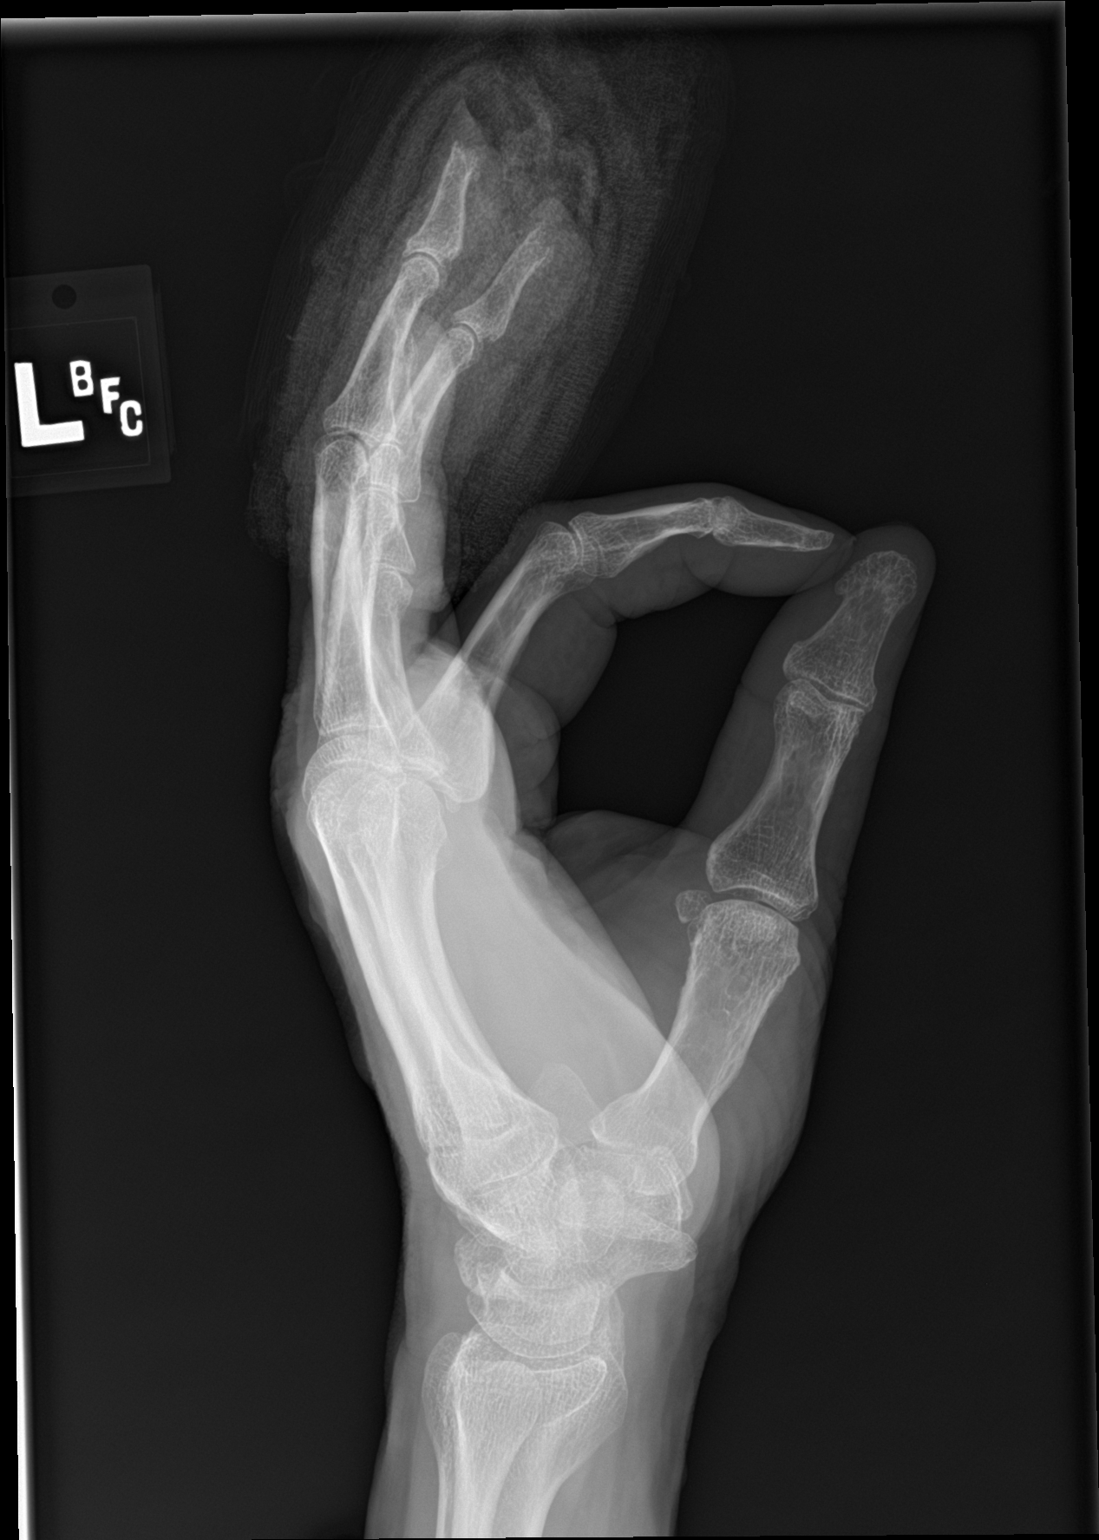

[1 of 1 positions shown; findings below may reference images not displayed]

FINDINGS: Soft tissue amputation is noted the distal aspects of the third and
fourth digits. Evaluation is somewhat limited due to overlying
dressing. Amputation of the third and fourth no other focal
abnormality is seen. Phalangeal [REDACTED] are noted as well
IMPRESSION: Distal amputation in the third and fourth digits.

## 2016-02-13 IMAGING — CR DG HAND COMPLETE 3+V*R*
3 series · 3 of 3 positions shown · non-contrast
Comparison: None.

CLINICAL DATA: Fingers crushing car door bilaterally, pain, initial
encounter

EXAM:
RIGHT HAND - COMPLETE 3+ VIEW

[hand pa]
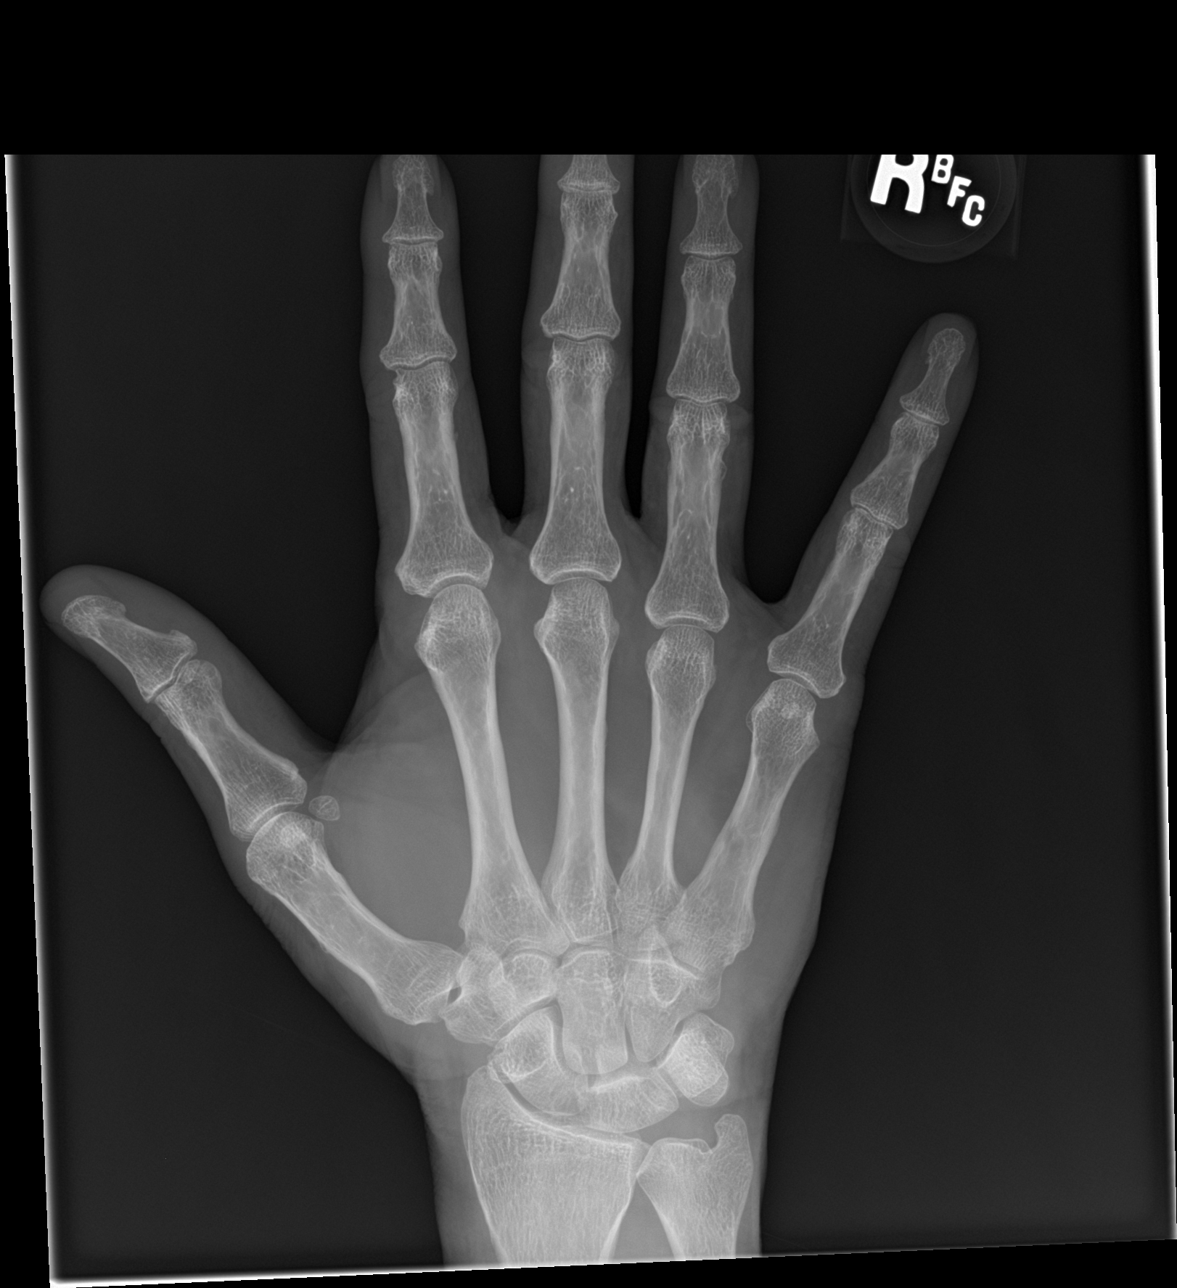

[hand obl]
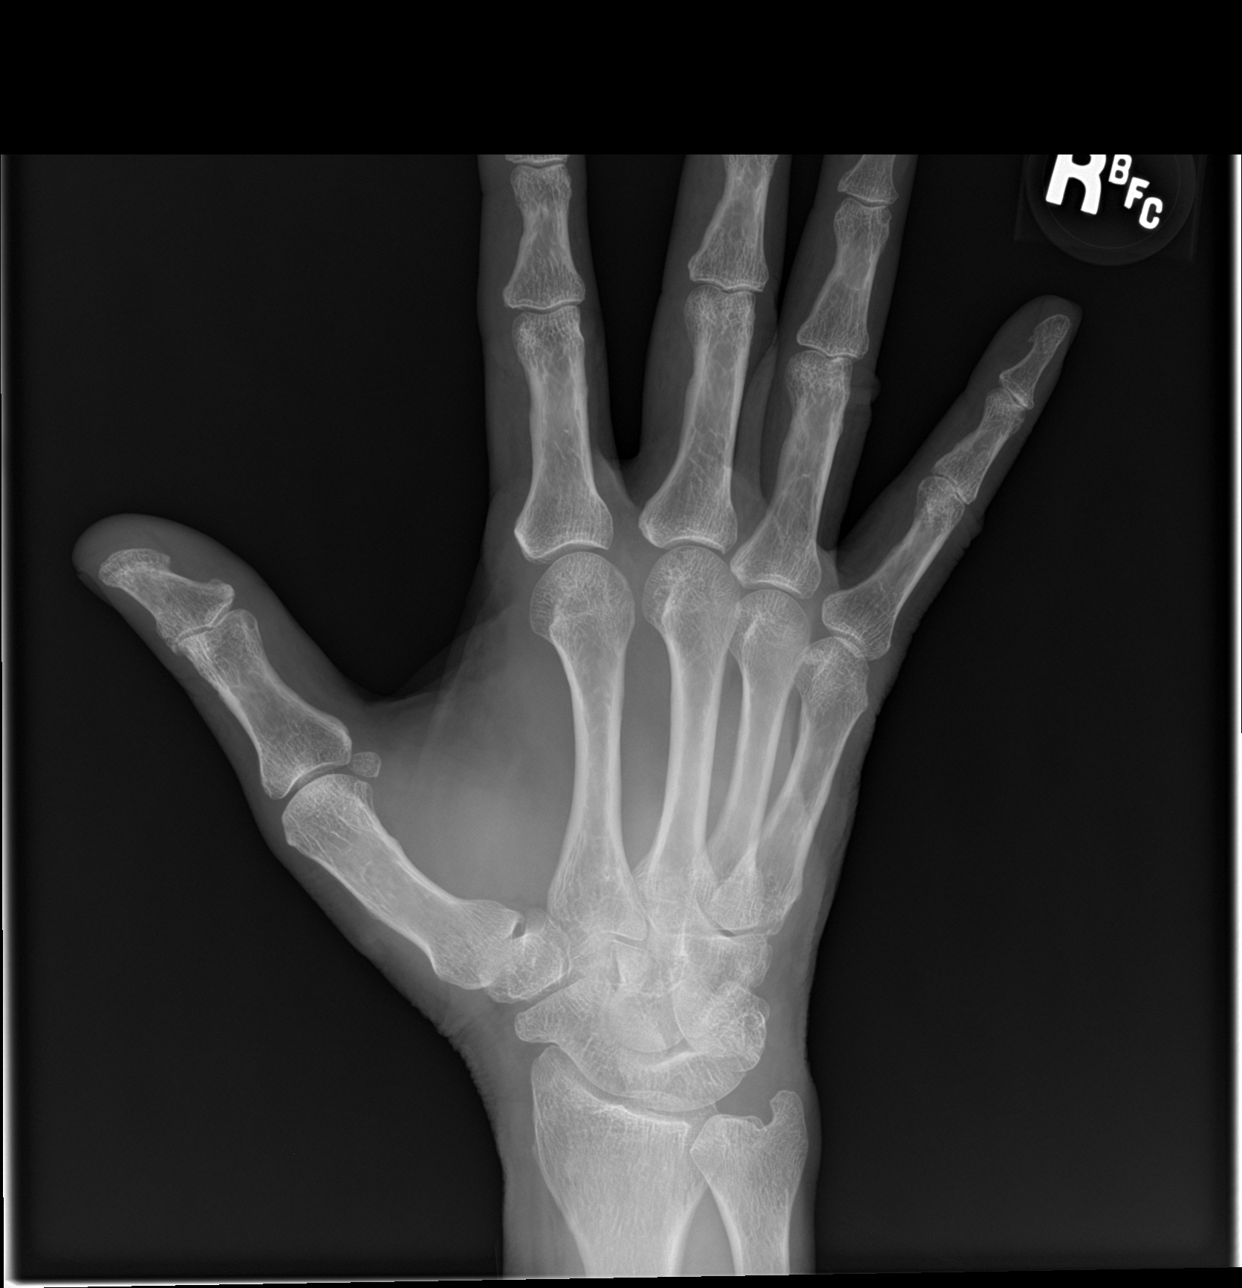

[hand lat]
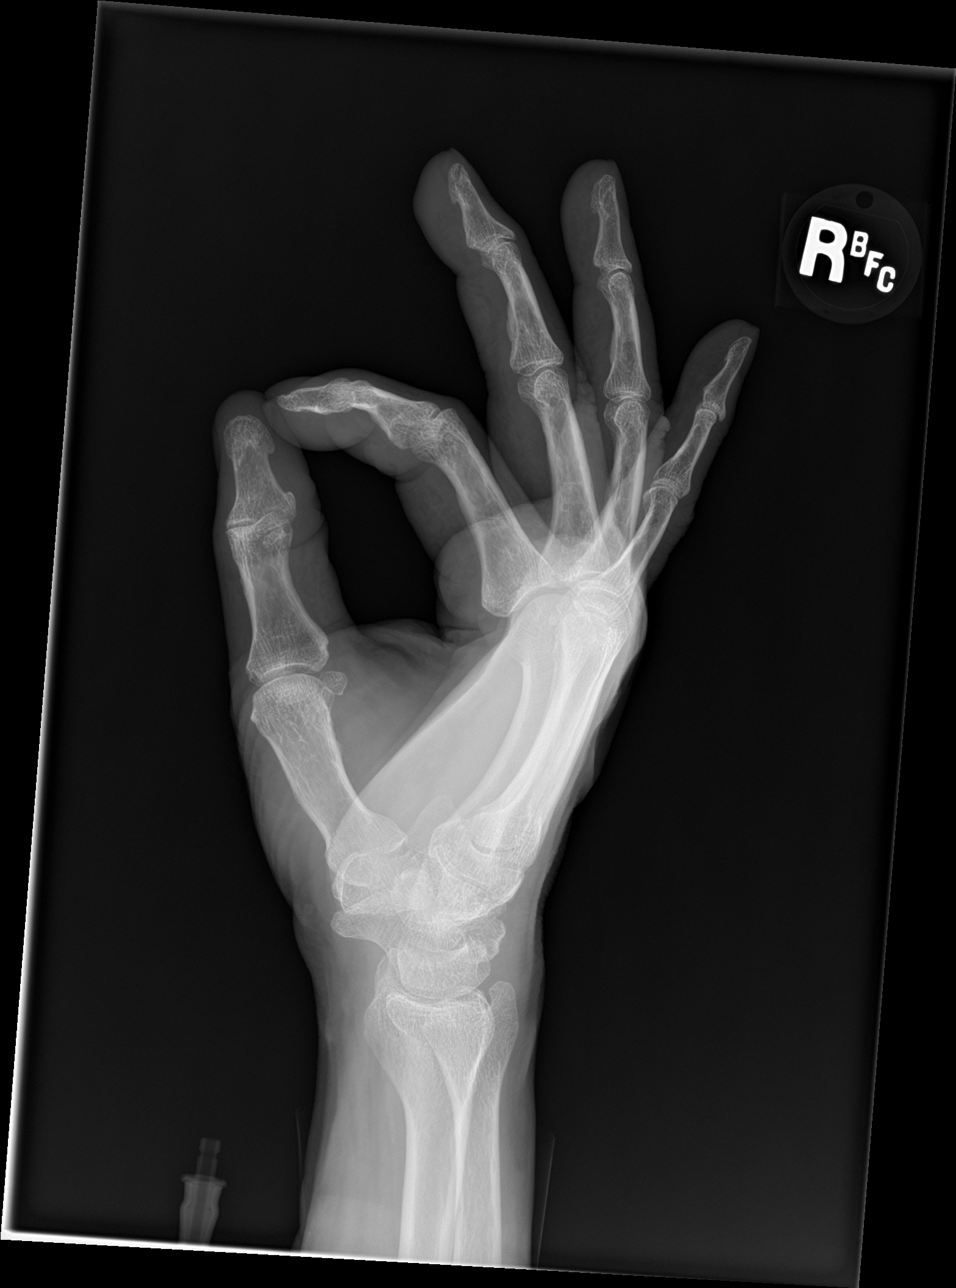

[3 of 3 positions shown; findings below may reference images not displayed]

FINDINGS: There is no evidence of fracture or dislocation. There is no
evidence of arthropathy or other focal bone abnormality. Soft
tissues are unremarkable.
IMPRESSION: No acute abnormality noted.

## 2016-02-13 MED ORDER — HYDROCODONE-ACETAMINOPHEN 5-325 MG PO TABS: 1.0000 | ORAL_TABLET | Freq: Four times a day (QID) | ORAL | Status: DC | PRN

## 2016-02-13 MED ORDER — DOXYCYCLINE HYCLATE 100 MG PO CAPS: 100.0000 mg | ORAL_CAPSULE | Freq: Two times a day (BID) | ORAL | Status: DC

## 2016-02-13 MED ORDER — LIDOCAINE HCL (PF) 1 % IJ SOLN
20.0000 mL | Freq: Once | INTRAMUSCULAR | Status: AC
  Administered 2016-02-13: 20 mL via INTRADERMAL
  Filled 2016-02-13: qty 20

## 2016-02-13 MED ORDER — HYDROCODONE-ACETAMINOPHEN 5-325 MG PO TABS
2.0000 | ORAL_TABLET | Freq: Once | ORAL | Status: AC
  Administered 2016-02-13: 2 via ORAL
  Filled 2016-02-13: qty 2

## 2016-02-13 MED ORDER — DOXYCYCLINE HYCLATE 100 MG PO TABS
100.0000 mg | ORAL_TABLET | Freq: Once | ORAL | Status: AC
  Administered 2016-02-13: 100 mg via ORAL
  Filled 2016-02-13: qty 1

## 2016-02-13 MED ORDER — BUPIVACAINE HCL (PF) 0.25 % IJ SOLN
5.0000 mL | Freq: Once | INTRAMUSCULAR | Status: AC
  Administered 2016-02-13: 5 mL
  Filled 2016-02-13: qty 5

## 2016-02-13 NOTE — Discharge Instructions (Signed)
Crush Injury, Fingers or Toes A crush injury to the fingers or toes means the tissues have been damaged by being squeezed (compressed). There will be bleeding into the tissues and swelling. Often, blood will collect under the skin. When this happens, the skin on the finger often dies and may slough off (shed) 1 week to 10 days later. Usually, new skin is growing underneath. If the injury has been too severe and the tissue does not survive, the damaged tissue may begin to turn black over several days.  Wounds which occur because of the crushing may be stitched (sutured) shut. However, crush injuries are more likely to become infected than other injuries.These wounds may not be closed as tightly as other types of cuts to prevent infection. Nails involved are often lost. These usually grow back over several weeks.  DIAGNOSIS X-rays may be taken to see if there is any injury to the bones. TREATMENT Broken bones (fractures) may be treated with splinting, depending on the fracture. Often, no treatment is required for fractures of the last bone in the fingers or toes. HOME CARE INSTRUCTIONS   The crushed part should be raised (elevated) above the heart or center of the chest as much as possible for the first several days or as directed. This helps with pain and lessens swelling. Less swelling increases the chances that the crushed part will survive.  Put ice on the injured area.  Put ice in a plastic bag.  Place a towel between your skin and the bag.  Leave the ice on for 15-20 minutes, 03-04 times a day for the first 2 days.  Only take over-the-counter or prescription medicines for pain, discomfort, or fever as directed by your caregiver.  Use your injured part only as directed.  Change your bandages (dressings) as directed.  Keep all follow-up appointments as directed by your caregiver. Not keeping your appointment could result in a chronic or permanent injury, pain, and disability. If there is  any problem keeping the appointment, you must call to reschedule. SEEK IMMEDIATE MEDICAL CARE IF:   There is redness, swelling, or increasing pain in the wound area.  Pus is coming from the wound.  You have a fever.  You notice a bad smell coming from the wound or dressing.  The edges of the wound do not stay together after the sutures have been removed.  You are unable to move the injured finger or toe. MAKE SURE YOU:   Understand these instructions.  Will watch your condition.  Will get help right away if you are not doing well or get worse.   This information is not intended to replace advice given to you by your health care provider. Make sure you discuss any questions you have with your health care provider.   Document Released: 09/05/2005 Document Revised: 11/28/2011 Document Reviewed: 01/21/2011 Elsevier Interactive Patient Education [employer].

## 2016-02-13 NOTE — ED Provider Notes (Signed)
CSN: [id number]     Arrival date & time 02/13/16  1732 History   First MD Initiated Contact with Patient 02/13/16 1809     Chief Complaint  Patient presents with  . finger lacerations      (Consider location/radiation/quality/duration/timing/severity/associated sxs/prior Treatment) HPI Comments: 75 year old male with hypertension, hyperlipidemia, hypothyroidism who presents with crush injuries to his fingers. Just prior to arrival, the patient smashed his fingers on both hands in a garage door. He sustained lacerations to the tips of his left third and fourth fingers. He endorses normal sensation in his hands and denies any other injuries. His pain is moderate and constant. [name] up-to-date on tetanus. No medications prior to arrival.  The history is provided by the patient.    Past Medical History  Diagnosis Date  . High cholesterol   . Hypothyroid   . Hypertension   . Depression    History reviewed. No pertinent past surgical history. No family history on file. Social History  Substance Use Topics  . Smoking status: Never Smoker   . Smokeless tobacco: None  . Alcohol Use: No    Review of Systems 10 Systems reviewed and are negative for acute change except as noted in the HPI.    Allergies  Codeine; Hydrocodone; and Oxycodone  Home Medications   Prior to Admission medications   Medication Sig Start Date End Date Taking? Authorizing Provider  aspirin 81 MG tablet Take 81 mg by mouth daily.   Yes Historical Provider, MD  atorvastatin (LIPITOR) 10 MG tablet Take 5 mg by mouth daily.    Yes Historical Provider, MD  BIOTIN PO Take 1 tablet by mouth daily.   Yes Historical Provider, MD  Cholecalciferol (VITAMIN D PO) Take 1 tablet by mouth daily.   Yes Historical Provider, MD  Coenzyme Q10 (COQ10 PO) Take 1 tablet by mouth daily.   Yes Historical Provider, MD  doxycycline (VIBRAMYCIN) 100 MG capsule Take 1 capsule (100 mg total) by mouth 2 (two) times daily. 02/13/16    Sharlett Iles, MD  HYDROcodone-acetaminophen (NORCO) 5-325 MG tablet Take 1-2 tablets by mouth every 6 (six) hours as needed for moderate pain. 02/13/16   Sharlett Iles, MD  levothyroxine (SYNTHROID, LEVOTHROID) 75 MCG tablet Take 75 mcg by mouth daily before breakfast.   Yes Historical Provider, MD   BP 145/64 mmHg  Pulse 60  Temp(Src) 97.5 F (36.4 C) (Oral)  Resp 14  SpO2 98% Physical Exam  Constitutional: [name] oriented to person, place, and time. He appears well-developed and well-nourished. No distress.  HENT:  Head: Normocephalic and atraumatic.  Eyes: Conjunctivae are normal.  Cardiovascular: Normal rate, regular rhythm and intact distal pulses.   Murmur heard.  Systolic murmur is present with a grade of 1/6  Pulmonary/Chest: Effort normal and breath sounds normal. No respiratory distress.  Musculoskeletal: He exhibits edema and tenderness.  Crush injuries to R 2nd-4th fingertips with ecchymoses on pads of fingers and small subungal hematoma 3rd fingernail; normal ROM R fingers; distal fingertip amputations of L 3rd and 4th fingers not involving nails with no obvious bone exposure, normal sensation fingers and normal ROM at DIP and PIP joints; 2+ radial pulses  Neurological: [name] alert and oriented to person, place, and time.  Skin: Skin is warm.  See photo  Psychiatric: He has a normal mood and affect. Judgment normal.        ED Course  Procedures (including critical care time) Labs Review Labs Reviewed - No  data to display  Imaging Review Dg Hand Complete Left  02/13/2016  CLINICAL DATA:  Fingers crushed in garage door, initial encounter EXAM: LEFT HAND - COMPLETE 3+ VIEW COMPARISON:  None. FINDINGS: Soft tissue amputation is noted the distal aspects of the third and fourth digits. Evaluation is somewhat limited due to overlying dressing. Amputation of the third and fourth no other focal abnormality is seen. Phalangeal tufts are noted as well IMPRESSION:  Distal amputation in the third and fourth digits. Electronically Signed   By: Inez Catalina M.D.   On: 02/13/2016 20:01   Dg Hand Complete Right  02/13/2016  CLINICAL DATA:  Fingers crushing car door bilaterally, pain, initial encounter EXAM: RIGHT HAND - COMPLETE 3+ VIEW COMPARISON:  None. FINDINGS: There is no evidence of fracture or dislocation. There is no evidence of arthropathy or other focal bone abnormality. Soft tissues are unremarkable. IMPRESSION: No acute abnormality noted. Electronically Signed   By: Inez Catalina M.D.   On: 02/13/2016 20:00     Medications  HYDROcodone-acetaminophen (NORCO/VICODIN) 5-325 MG per tablet 2 tablet (2 tablets Oral Given 02/13/16 1858)  bupivacaine (PF) (MARCAINE) 0.25 % injection 5 mL (5 mLs Infiltration Given 02/13/16 2317)  lidocaine (PF) (XYLOCAINE) 1 % injection 20 mL (20 mLs Intradermal Given 02/13/16 2318)  doxycycline (VIBRA-TABS) tablet 100 mg (100 mg Oral Given 02/13/16 2317)     MDM   Final diagnoses:  Crushing injury of hand and fingers, initial encounter   Pt p/w crush injuries to fingertips on b/l hands, with distal fingertip amputations involving L 3rd and 4th fingers. Normal ROM at joints. No arterial bleeding. Obtained plain films and gave norco for pain. Plain film showed no fracture of right hand, distal amputation of third and fourth digits. Consulted hand surgeon, Dr. Fredna Dow, appreciate his assistance. He repaired injuries at bedside and discussed home care as well as f/u in his clinic in 1 week. Per his instructions, gave course of doxycyline as well as pain medications to use at home. Supportive care as well as return precautions and patient discharged in satisfactory condition.   Sharlett Iles, MD 02/14/16 [phone]

## 2016-02-13 NOTE — ED Notes (Signed)
Patient here with lacerations to left hand middle and ring fingers after catching same in garage door this evening. Crush injuries to same. Also bruising to right hand middle finger

## 2016-02-13 NOTE — ED Notes (Signed)
Pt ambulates independently and with steady gait at time of discharge. Discharge instructions and follow up information reviewed with patient. No other questions or concerns voiced at this time.  

## 2016-02-13 NOTE — Consult Note (Signed)
Phillip Franklin is an 75 y.o. male.   Chief Complaint: left long and ring fingertip crush injury HPI: 75 yo rhd male present with wife states he pinched tips of left long and ring fingers between panels of garage door as he was closing it.  This occurred ~430 PM today.  Seen at [facility] where XR showed tuft fracture/bone loss.  I was consulted for management of the injury.  He reports an aching type pain of 2/10 severity.  Worsened with movement and relieved with resting.  Case discussed with Theotis Burrow, MD and her note from 02/13/16 reviewed. Xrays viewed and interpreted by me: 3 views left fingers show tuft fractures of long and ring fingertips with some bone loss.  3 views right fingers show no fractures, dislocations, radioopaque foreign bodies. Labs reviewed: none  Allergies:  Allergies  Allergen Reactions  . Codeine Other (See Comments)    hallucinations  . Hydrocodone Other (See Comments)    hallucinations  . Oxycodone Other (See Comments)    hallucinations    Past Medical History  Diagnosis Date  . High cholesterol   . Hypothyroid   . Hypertension   . Depression     History reviewed. No pertinent past surgical history.  Family History: No family history on file.  Social History:   reports that he has never smoked. He does not have any smokeless tobacco history on file. He reports that he does not drink alcohol or use illicit drugs.  Medications:  (Not in a hospital admission)  No results found for this or any previous visit (from the past 48 hour(s)).  Dg Hand Complete Left  02/13/2016  CLINICAL DATA:  Fingers crushed in garage door, initial encounter EXAM: LEFT HAND - COMPLETE 3+ VIEW COMPARISON:  None. FINDINGS: Soft tissue amputation is noted the distal aspects of the third and fourth digits. Evaluation is somewhat limited due to overlying dressing. Amputation of the third and fourth no other focal abnormality is seen. Phalangeal tufts are noted as well IMPRESSION:  Distal amputation in the third and fourth digits. Electronically Signed   By: Inez Catalina M.D.   On: 02/13/2016 20:01   Dg Hand Complete Right  02/13/2016  CLINICAL DATA:  Fingers crushing car door bilaterally, pain, initial encounter EXAM: RIGHT HAND - COMPLETE 3+ VIEW COMPARISON:  None. FINDINGS: There is no evidence of fracture or dislocation. There is no evidence of arthropathy or other focal bone abnormality. Soft tissues are unremarkable. IMPRESSION: No acute abnormality noted. Electronically Signed   By: Inez Catalina M.D.   On: 02/13/2016 20:00     A comprehensive review of systems was negative. Review of Systems: No fevers, chills, night sweats, chest pain, shortness of breath, nausea, vomiting, diarrhea, constipation, easy bleeding or bruising, headaches, dizziness, vision changes, fainting.   Blood pressure 125/68, pulse 59, temperature 97.5 F (36.4 C), temperature source Oral, resp. rate 14, SpO2 98 %.  General appearance: alert, cooperative and appears stated age Head: Normocephalic, without obvious abnormality, atraumatic Neck: supple, symmetrical, trachea midline Extremities: intact sensation and capillary refill all digits.  +epl/fpl/io.  Right long with small subungual hematoma.  No other wounds.  Left long and ring with tip amputations.  Distal aspect of nail bed involved on long, pad tissue and bone on ring.  No gross contamination.  Able to flex and extend at dip joints. Pulses: 2+ and symmetric Skin: Skin color, texture, turgor normal. No rashes or lesions Neurologic: Grossly normal Incision/Wound: As above  Assessment/Plan  Right long and ring fingertip crush injuries with tip amputations through tuft.  Recommend revision amputation of left long and ring fingertips in ED under digital block.  Risks, benefits, and alternatives of surgery were discussed and the patient and his wife voiced understanding and agreement with the plan of care.  PROCEDURE NOTE: PROCEDURE  NOTE:  Digital block to left long and ring fingers performed with 29m half and half 1% plain lidocaine and 0.25% plain marcaine.  Adequate to give digital anesthesia.  Wounds and open fractures copiously irrigated with 10055msterile saline and betadine solution.  Fingers prepped with betadine and sterilely draped.  Penrose used as a tourniquet and was up ~ 25 mintues per finger.  Ring finger addressed first.  There was loss of skin and bone at the tip of the finger with loss of bony support to distal nail bed.  Nail elevated and removed with freer.  Unsupported nail bed sharply removed with scissors.  Pulp of pad of finger mobilized sharply with the scissors and brought over end of bone.  5-0 monocryl suture was used to approximate the pad and nail bed tissues and provide coverage of the bone.  Penrose removed from ring finger and placed on long finger.  Nail elevated and removed with freer elevator.  Pad tissues sharply mobilized with the knife.  Damaged distal aspect of nail bed removed and bone rongeured back to allow soft tissue coverage of the end of the distal phalanx.  6-0 chromic suture used to approximate the pad and nail bed tissues.  Penrose drain removed.  Xeroform placed in nail folds and wounds dressed with sterile xeroform, 4x4s, and wrapped lightly with coban dressing.  Alumafoam splints placed and wrapped lightly with coban dressing.  Patient tolerated procedure well.  Follow up in 1 week.  Pain meds and antibiotic prescribed by ED.   Usha Slager R 02/13/2016, 10:35 PM

## 2016-02-17 DIAGNOSIS — M79645 Pain in left finger(s): Secondary | ICD-10-CM | POA: Diagnosis not present

## 2016-02-17 DIAGNOSIS — S68115A Complete traumatic metacarpophalangeal amputation of left ring finger, initial encounter: Secondary | ICD-10-CM | POA: Diagnosis not present

## 2016-02-17 DIAGNOSIS — S68113A Complete traumatic metacarpophalangeal amputation of left middle finger, initial encounter: Secondary | ICD-10-CM | POA: Diagnosis not present

## 2016-02-18 DIAGNOSIS — E291 Testicular hypofunction: Secondary | ICD-10-CM | POA: Diagnosis not present

## 2016-03-02 DIAGNOSIS — S68113A Complete traumatic metacarpophalangeal amputation of left middle finger, initial encounter: Secondary | ICD-10-CM | POA: Diagnosis not present

## 2016-03-02 DIAGNOSIS — S68115A Complete traumatic metacarpophalangeal amputation of left ring finger, initial encounter: Secondary | ICD-10-CM | POA: Diagnosis not present

## 2016-03-21 DIAGNOSIS — E291 Testicular hypofunction: Secondary | ICD-10-CM | POA: Diagnosis not present

## 2016-04-20 DIAGNOSIS — S68115A Complete traumatic metacarpophalangeal amputation of left ring finger, initial encounter: Secondary | ICD-10-CM | POA: Diagnosis not present

## 2016-04-20 DIAGNOSIS — S68113A Complete traumatic metacarpophalangeal amputation of left middle finger, initial encounter: Secondary | ICD-10-CM | POA: Diagnosis not present

## 2016-04-22 DIAGNOSIS — E291 Testicular hypofunction: Secondary | ICD-10-CM | POA: Diagnosis not present

## 2016-04-25 DIAGNOSIS — L905 Scar conditions and fibrosis of skin: Secondary | ICD-10-CM | POA: Diagnosis not present

## 2016-04-25 DIAGNOSIS — S68115A Complete traumatic metacarpophalangeal amputation of left ring finger, initial encounter: Secondary | ICD-10-CM | POA: Diagnosis not present

## 2016-04-25 DIAGNOSIS — M79645 Pain in left finger(s): Secondary | ICD-10-CM | POA: Diagnosis not present

## 2016-04-25 DIAGNOSIS — R203 Hyperesthesia: Secondary | ICD-10-CM | POA: Diagnosis not present

## 2016-04-25 DIAGNOSIS — S68113A Complete traumatic metacarpophalangeal amputation of left middle finger, initial encounter: Secondary | ICD-10-CM | POA: Diagnosis not present

## 2016-05-24 DIAGNOSIS — E291 Testicular hypofunction: Secondary | ICD-10-CM | POA: Diagnosis not present

## 2016-06-01 DIAGNOSIS — S68115A Complete traumatic metacarpophalangeal amputation of left ring finger, initial encounter: Secondary | ICD-10-CM | POA: Diagnosis not present

## 2016-06-01 DIAGNOSIS — S68113A Complete traumatic metacarpophalangeal amputation of left middle finger, initial encounter: Secondary | ICD-10-CM | POA: Diagnosis not present

## 2016-06-08 DIAGNOSIS — S68113A Complete traumatic metacarpophalangeal amputation of left middle finger, initial encounter: Secondary | ICD-10-CM | POA: Diagnosis not present

## 2016-06-08 DIAGNOSIS — R203 Hyperesthesia: Secondary | ICD-10-CM | POA: Diagnosis not present

## 2016-06-08 DIAGNOSIS — S68115A Complete traumatic metacarpophalangeal amputation of left ring finger, initial encounter: Secondary | ICD-10-CM | POA: Diagnosis not present

## 2016-06-08 DIAGNOSIS — R29898 Other symptoms and signs involving the musculoskeletal system: Secondary | ICD-10-CM | POA: Diagnosis not present

## 2016-06-24 DIAGNOSIS — E7800 Pure hypercholesterolemia, unspecified: Secondary | ICD-10-CM | POA: Diagnosis not present

## 2016-06-24 DIAGNOSIS — N400 Enlarged prostate without lower urinary tract symptoms: Secondary | ICD-10-CM | POA: Diagnosis not present

## 2016-06-24 DIAGNOSIS — E291 Testicular hypofunction: Secondary | ICD-10-CM | POA: Diagnosis not present

## 2016-06-24 DIAGNOSIS — I10 Essential (primary) hypertension: Secondary | ICD-10-CM | POA: Diagnosis not present

## 2016-06-24 DIAGNOSIS — E039 Hypothyroidism, unspecified: Secondary | ICD-10-CM | POA: Diagnosis not present

## 2016-06-24 DIAGNOSIS — Z23 Encounter for immunization: Secondary | ICD-10-CM | POA: Diagnosis not present

## 2016-06-24 DIAGNOSIS — F3341 Major depressive disorder, recurrent, in partial remission: Secondary | ICD-10-CM | POA: Diagnosis not present

## 2016-06-27 DIAGNOSIS — L905 Scar conditions and fibrosis of skin: Secondary | ICD-10-CM | POA: Diagnosis not present

## 2016-06-27 DIAGNOSIS — R29898 Other symptoms and signs involving the musculoskeletal system: Secondary | ICD-10-CM | POA: Diagnosis not present

## 2016-06-27 DIAGNOSIS — R203 Hyperesthesia: Secondary | ICD-10-CM | POA: Diagnosis not present

## 2016-06-27 DIAGNOSIS — S68113A Complete traumatic metacarpophalangeal amputation of left middle finger, initial encounter: Secondary | ICD-10-CM | POA: Diagnosis not present

## 2016-06-27 DIAGNOSIS — S68115A Complete traumatic metacarpophalangeal amputation of left ring finger, initial encounter: Secondary | ICD-10-CM | POA: Diagnosis not present

## 2016-06-27 DIAGNOSIS — M79645 Pain in left finger(s): Secondary | ICD-10-CM | POA: Diagnosis not present

## 2016-07-06 DIAGNOSIS — R29898 Other symptoms and signs involving the musculoskeletal system: Secondary | ICD-10-CM | POA: Diagnosis not present

## 2016-07-06 DIAGNOSIS — S68115A Complete traumatic metacarpophalangeal amputation of left ring finger, initial encounter: Secondary | ICD-10-CM | POA: Diagnosis not present

## 2016-07-06 DIAGNOSIS — S68113A Complete traumatic metacarpophalangeal amputation of left middle finger, initial encounter: Secondary | ICD-10-CM | POA: Diagnosis not present

## 2016-07-06 DIAGNOSIS — M79645 Pain in left finger(s): Secondary | ICD-10-CM | POA: Diagnosis not present

## 2016-07-06 DIAGNOSIS — R203 Hyperesthesia: Secondary | ICD-10-CM | POA: Diagnosis not present

## 2016-07-13 DIAGNOSIS — M79645 Pain in left finger(s): Secondary | ICD-10-CM | POA: Diagnosis not present

## 2016-07-13 DIAGNOSIS — S68115A Complete traumatic metacarpophalangeal amputation of left ring finger, initial encounter: Secondary | ICD-10-CM | POA: Diagnosis not present

## 2016-07-13 DIAGNOSIS — S68113A Complete traumatic metacarpophalangeal amputation of left middle finger, initial encounter: Secondary | ICD-10-CM | POA: Diagnosis not present

## 2016-07-13 DIAGNOSIS — L905 Scar conditions and fibrosis of skin: Secondary | ICD-10-CM | POA: Diagnosis not present

## 2016-07-13 DIAGNOSIS — R203 Hyperesthesia: Secondary | ICD-10-CM | POA: Diagnosis not present

## 2016-07-13 DIAGNOSIS — R29898 Other symptoms and signs involving the musculoskeletal system: Secondary | ICD-10-CM | POA: Diagnosis not present

## 2016-07-20 DIAGNOSIS — S68113A Complete traumatic metacarpophalangeal amputation of left middle finger, initial encounter: Secondary | ICD-10-CM | POA: Diagnosis not present

## 2016-07-20 DIAGNOSIS — S68115A Complete traumatic metacarpophalangeal amputation of left ring finger, initial encounter: Secondary | ICD-10-CM | POA: Diagnosis not present

## 2016-07-20 DIAGNOSIS — M79645 Pain in left finger(s): Secondary | ICD-10-CM | POA: Diagnosis not present

## 2016-07-20 DIAGNOSIS — R203 Hyperesthesia: Secondary | ICD-10-CM | POA: Diagnosis not present

## 2016-07-26 DIAGNOSIS — E291 Testicular hypofunction: Secondary | ICD-10-CM | POA: Diagnosis not present

## 2016-07-27 DIAGNOSIS — S68113A Complete traumatic metacarpophalangeal amputation of left middle finger, initial encounter: Secondary | ICD-10-CM | POA: Diagnosis not present

## 2016-07-27 DIAGNOSIS — S68115A Complete traumatic metacarpophalangeal amputation of left ring finger, initial encounter: Secondary | ICD-10-CM | POA: Diagnosis not present

## 2016-09-01 DIAGNOSIS — E291 Testicular hypofunction: Secondary | ICD-10-CM | POA: Diagnosis not present

## 2016-10-03 DIAGNOSIS — E291 Testicular hypofunction: Secondary | ICD-10-CM | POA: Diagnosis not present

## 2016-11-04 DIAGNOSIS — E291 Testicular hypofunction: Secondary | ICD-10-CM | POA: Diagnosis not present

## 2016-11-13 ENCOUNTER — Encounter (HOSPITAL_COMMUNITY): Payer: Self-pay | Admitting: Emergency Medicine

## 2016-11-13 ENCOUNTER — Emergency Department (HOSPITAL_COMMUNITY)
Admission: EM | Admit: 2016-11-13 | Discharge: 2016-11-13 | Disposition: A | Discharge status: Home / Self Care | Payer: Medicare PPO | Attending: Emergency Medicine | Admitting: Emergency Medicine

## 2016-11-13 ENCOUNTER — Emergency Department (HOSPITAL_COMMUNITY): Payer: Medicare PPO

## 2016-11-13 DIAGNOSIS — Y999 Unspecified external cause status: Secondary | ICD-10-CM | POA: Diagnosis not present

## 2016-11-13 DIAGNOSIS — W1839XA Other fall on same level, initial encounter: Secondary | ICD-10-CM | POA: Insufficient documentation

## 2016-11-13 DIAGNOSIS — Z79899 Other long term (current) drug therapy: Secondary | ICD-10-CM | POA: Diagnosis not present

## 2016-11-13 DIAGNOSIS — R0781 Pleurodynia: Secondary | ICD-10-CM | POA: Insufficient documentation

## 2016-11-13 DIAGNOSIS — Z7982 Long term (current) use of aspirin: Secondary | ICD-10-CM | POA: Insufficient documentation

## 2016-11-13 DIAGNOSIS — R0789 Other chest pain: Secondary | ICD-10-CM

## 2016-11-13 DIAGNOSIS — Y929 Unspecified place or not applicable: Secondary | ICD-10-CM | POA: Insufficient documentation

## 2016-11-13 DIAGNOSIS — S299XXA Unspecified injury of thorax, initial encounter: Secondary | ICD-10-CM | POA: Diagnosis not present

## 2016-11-13 DIAGNOSIS — W19XXXA Unspecified fall, initial encounter: Secondary | ICD-10-CM

## 2016-11-13 DIAGNOSIS — I10 Essential (primary) hypertension: Secondary | ICD-10-CM | POA: Insufficient documentation

## 2016-11-13 DIAGNOSIS — E039 Hypothyroidism, unspecified: Secondary | ICD-10-CM | POA: Insufficient documentation

## 2016-11-13 DIAGNOSIS — Y939 Activity, unspecified: Secondary | ICD-10-CM | POA: Insufficient documentation

## 2016-11-13 LAB — BASIC METABOLIC PANEL
Anion gap: 8 (ref 5–15)
BUN: 12 mg/dL (ref 6–20)
CO2: 26 mmol/L (ref 22–32)
Calcium: 9.6 mg/dL (ref 8.9–10.3)
Chloride: 101 mmol/L (ref 101–111)
Creatinine, Ser: 1.51 mg/dL — ABNORMAL HIGH (ref 0.61–1.24)
GFR calc Af Amer: 50 mL/min — ABNORMAL LOW (ref >60)
GFR calc non Af Amer: 43 mL/min — ABNORMAL LOW (ref >60)
Glucose, Bld: 150 mg/dL — ABNORMAL HIGH (ref 65–99)
Potassium: 3.3 mmol/L — ABNORMAL LOW (ref 3.5–5.1)
Sodium: 135 mmol/L (ref 135–145)

## 2016-11-13 LAB — CBC
HCT: 43.3 % (ref 39.0–52.0)
Hemoglobin: 14.1 g/dL (ref 13.0–17.0)
MCH: 29.3 pg (ref 26.0–34.0)
MCHC: 32.6 g/dL (ref 30.0–36.0)
MCV: 89.8 fL (ref 78.0–100.0)
Platelets: 214 10*3/uL (ref 150–400)
RBC: 4.82 MIL/uL (ref 4.22–5.81)
RDW: 14.4 % (ref 11.5–15.5)
WBC: 9.1 10*3/uL (ref 4.0–10.5)

## 2016-11-13 IMAGING — CR DG RIBS W/ CHEST 3+V*R*
4 series · 4 of 4 positions shown · non-contrast
Comparison: [DATE]

CLINICAL DATA: Syncope.  Fall.

EXAM:
RIGHT RIBS AND CHEST - 3+ VIEW

[chest pa]
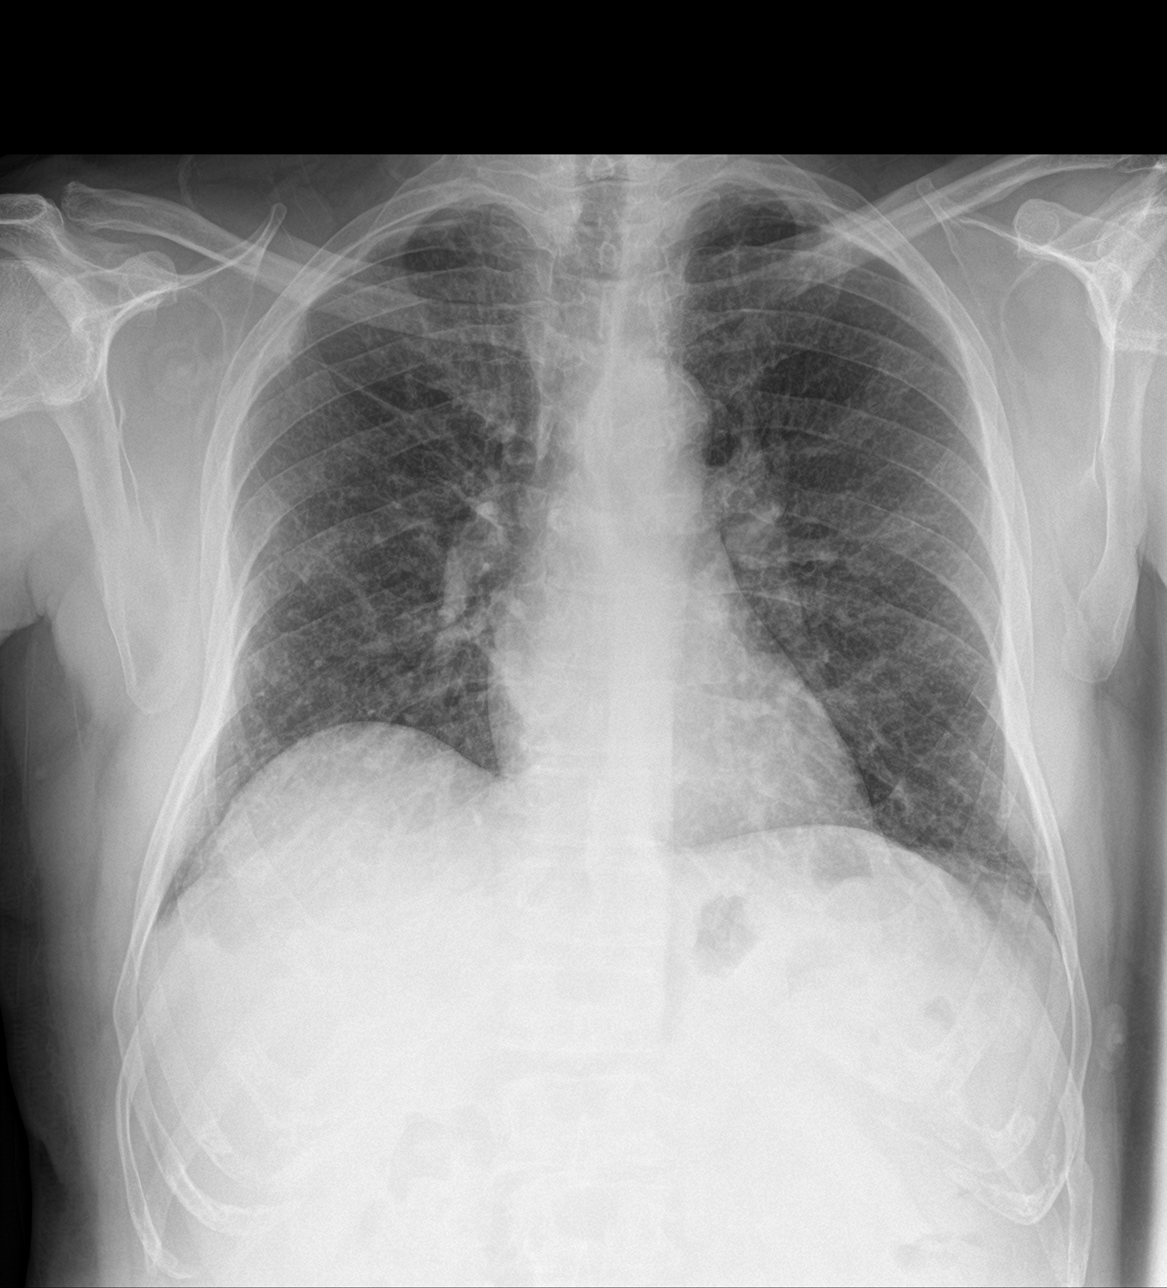

[rib pa]
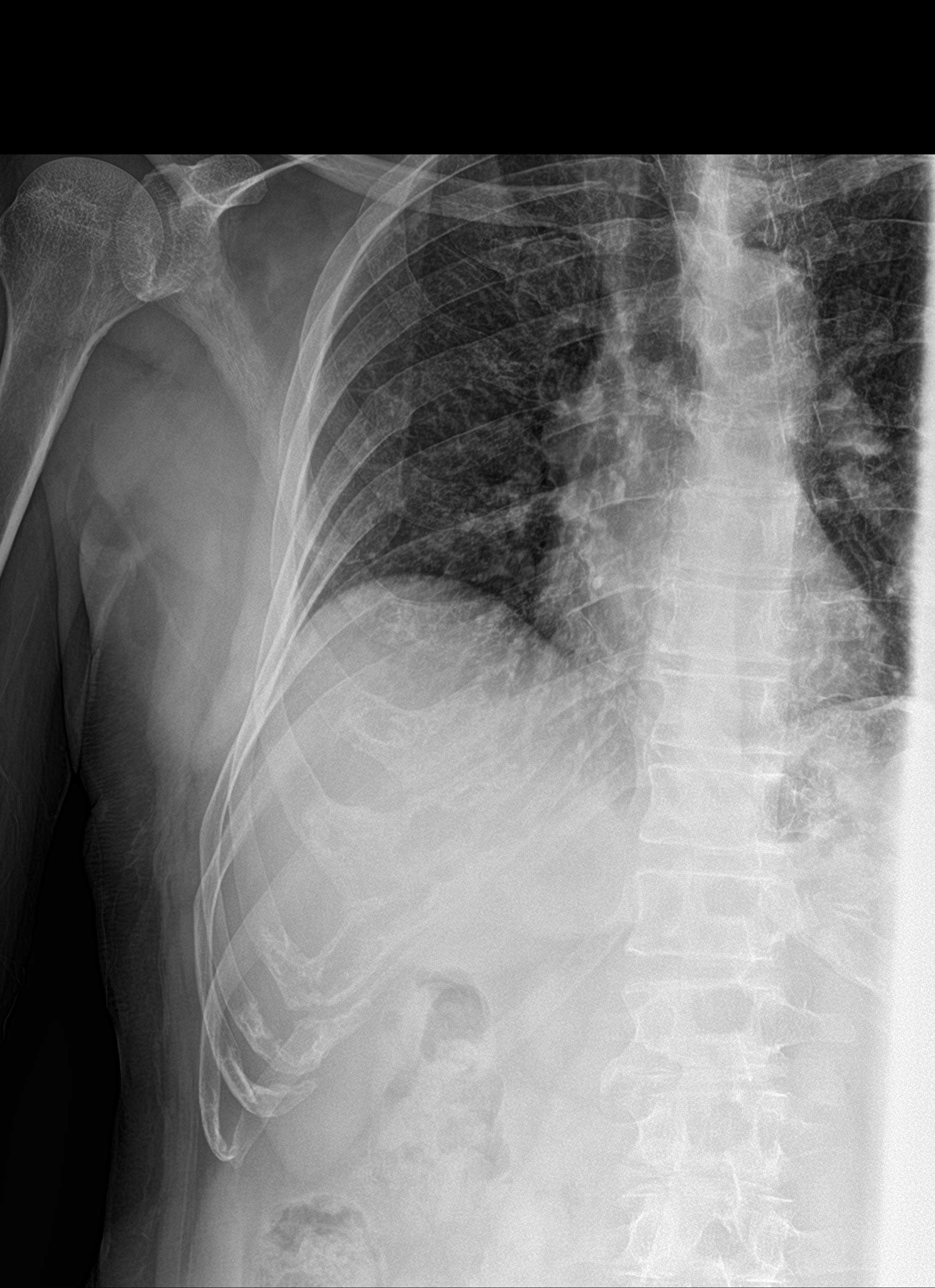

[rib pa obl (1 of 2)]
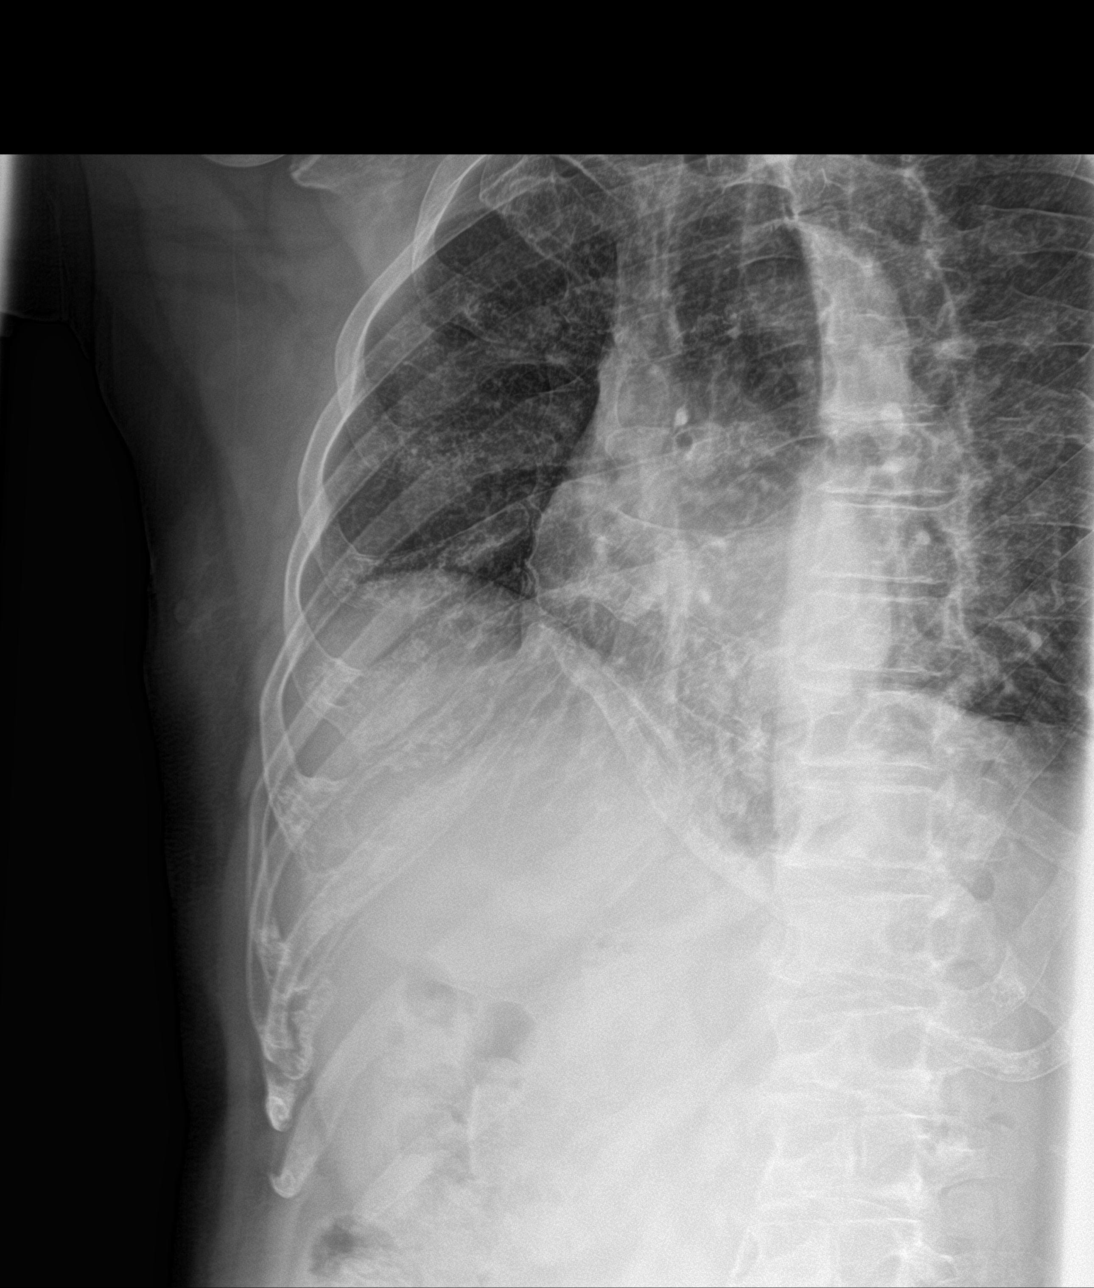

[rib pa obl (2 of 2)]
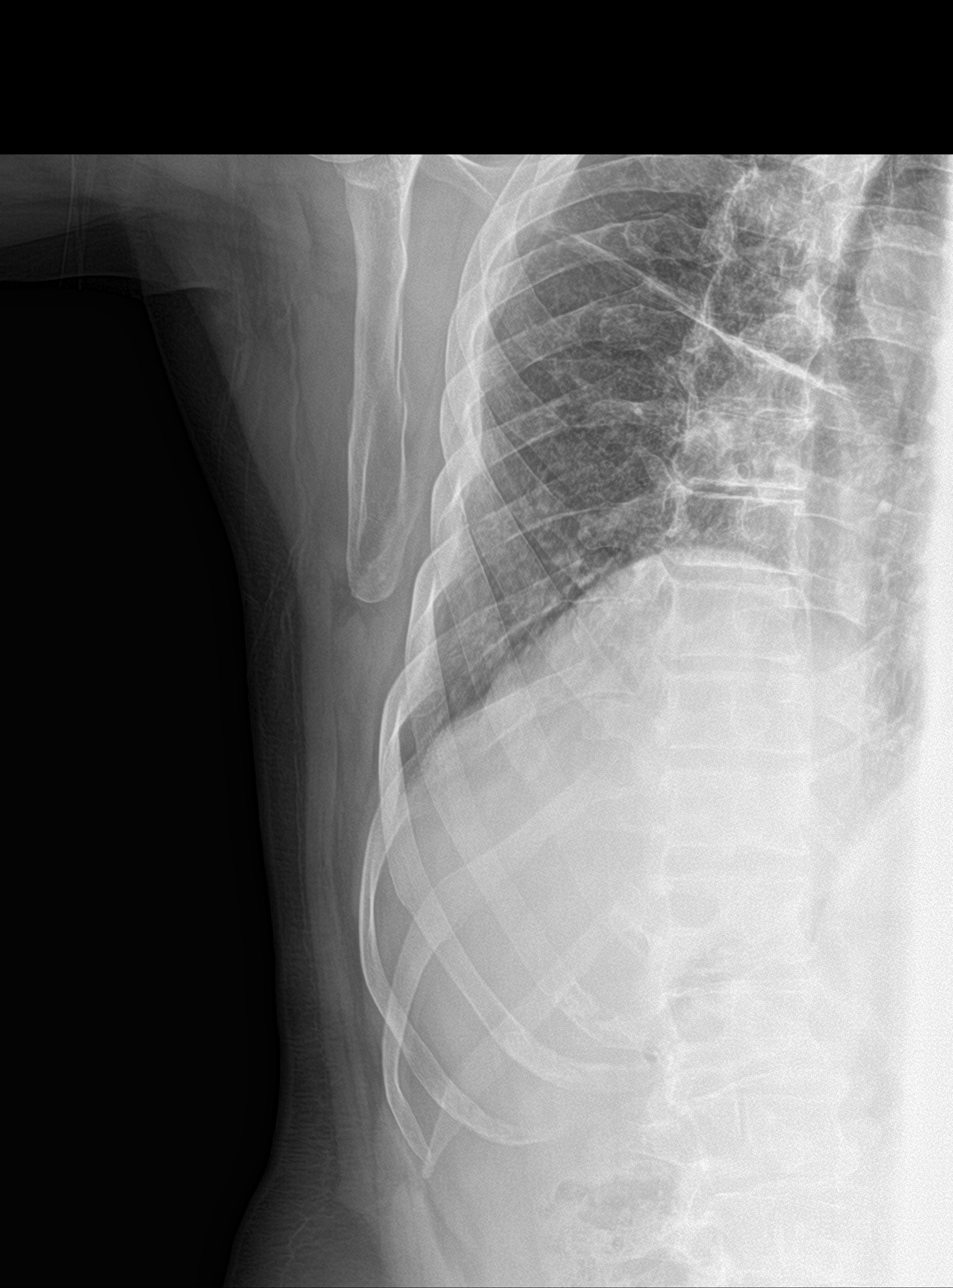

[4 of 4 positions shown; findings below may reference images not displayed]

FINDINGS: No acute fracture or other bone lesions are seen involving the ribs.
There is no evidence of pneumothorax or pleural effusion. Diffuse
chronic interstitial coarsening identified throughout both lungs.
Heart size and mediastinal contours are within normal limits.
IMPRESSION: 1. No displaced rib fractures identified.
2. Chronic interstitial coarsening identified in both lungs.

## 2016-11-13 MED ORDER — SODIUM CHLORIDE 0.9 % IV BOLUS (SEPSIS)
1000.0000 mL | Freq: Once | INTRAVENOUS | Status: AC
  Administered 2016-11-13: 1000 mL via INTRAVENOUS

## 2016-11-13 MED ORDER — POLYETHYLENE GLYCOL 3350 17 G PO PACK: 17.0000 g | PACK | Freq: Every day | ORAL | 0 refills | Status: DC

## 2016-11-13 NOTE — ED Notes (Signed)
Pt. Ambulated with EDP. Pt. Became dizzy. Pt. Returned to his bed.

## 2016-11-13 NOTE — ED Triage Notes (Signed)
Pt sts syncope last week with fall; pt c/o right side pain and nausea; pt sts constipation also per family

## 2016-11-13 NOTE — Discharge Instructions (Signed)
Please use Tylenol or ibuprofen as needed for discomfort.  Please follow-up with primary care for reassessment.  Please return to the emergency room immediately if you have any new or worsening signs or symptoms.

## 2016-11-13 NOTE — ED Notes (Signed)
Pt verbalized understanding discharge instructions and denies any further needs or questions at this time. VS stable, ambulatory and steady gait.   

## 2016-11-13 NOTE — ED Provider Notes (Signed)
[facility] DEPT Provider Note   CSN: [id number] Arrival date & time: 11/13/16  1228     History   Chief Complaint Chief Complaint  Patient presents with  . Loss of Consciousness  . Constipation    HPI Phillip Franklin is a 76 y.o. male.  HPI   76 year old male presents today after fall.  Patient reports 3 days ago after sitting for a prolonged period of time he sat up quickly felt lightheaded and passed out.  He notes neck recollection of the fall and coming to on the floor.  He was able to get himself off the floor without significant difficulty.  He notes pain to the right lateral ribs radiation around to the posterior ribs.  Patient denies any bruising or signs of trauma from the fall.  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  Patient notes that he intermittently since then has had some dizziness, but presently is not having any.  He has not had any presyncopal or syncopal episodes since this incident.  Patient reports pain with deep inspiration, pain with palpation of the right lateral and posterior ribs.  Patient denies any significant cough.  He notes since the incident he has been using tramadol which was previously prescribed.  He notes since the fall he has not had a normal bowel movement, reports that he has been nauseous with no vomiting.  He reports using an enema today that relieved some of the constipation.  Patient reports he is passing gas.  Patient has no headache or signs of trauma to the head or neck, no painful range of motion of the neck.  No other injuries noted from the fall.  History of syncope in the past.  Normal urinations.  Patient has been eating, but has had a decreased appetite over the last several days.  She is not on blood thinners.   Past Medical History:  Diagnosis Date  . Depression   . High cholesterol   . Hypertension   . Hypothyroid     There are no active problems to display  for this patient.   History reviewed. No pertinent surgical history.     Home Medications    Prior to Admission medications   Medication Sig Start Date End Date Taking? Authorizing Provider  aspirin 81 MG tablet Take 81 mg by mouth daily.    Historical Provider, MD  atorvastatin (LIPITOR) 10 MG tablet Take 5 mg by mouth daily.     Historical Provider, MD  BIOTIN PO Take 1 tablet by mouth daily.    Historical Provider, MD  Cholecalciferol (VITAMIN D PO) Take 1 tablet by mouth daily.    Historical Provider, MD  Coenzyme Q10 (COQ10 PO) Take 1 tablet by mouth daily.    Historical Provider, MD  doxycycline (VIBRAMYCIN) 100 MG capsule Take 1 capsule (100 mg total) by mouth 2 (two) times daily. 02/13/16   Sharlett Iles, MD  HYDROcodone-acetaminophen (NORCO) 5-325 MG tablet Take 1-2 tablets by mouth every 6 (six) hours as needed for moderate pain. 02/13/16   Sharlett Iles, MD  levothyroxine (SYNTHROID, LEVOTHROID) 75 MCG tablet Take 75 mcg by mouth daily before breakfast.    Historical Provider, MD  polyethylene glycol (MIRALAX) packet Take 17 g by mouth daily. 11/13/16   Okey Regal, PA-C    Family History History reviewed. No pertinent family history.  Social History Social History  Substance Use Topics  .  Smoking status: Never Smoker  . Smokeless tobacco: Not on file  . Alcohol use No     Allergies   Codeine; Hydrocodone; and Oxycodone   Review of Systems Review of Systems  All other systems reviewed and are negative.    Physical Exam Updated Vital Signs BP 144/79   Pulse 67   Temp 97.7 F (36.5 C) (Oral)   Resp 12   SpO2 97%   Physical Exam  Constitutional: [name] oriented to person, place, and time. He appears well-developed and well-nourished.  HENT:  Head: Normocephalic and atraumatic.  Eyes: Conjunctivae are normal. Pupils are equal, round, and reactive to light. Right eye exhibits no discharge. Left eye exhibits no discharge. No scleral icterus.   Neck: Normal range of motion. No JVD present. No tracheal deviation present.  Cardiovascular: Normal rate, regular rhythm and normal heart sounds.   Pulmonary/Chest: Effort normal and breath sounds normal. No stridor. No respiratory distress. He has no wheezes. He has no rales. He exhibits tenderness.  Chest is atraumatic.  Tenderness to palpation of the right anterior/lateral/and posterior ribs.  No crepitus, lungs clear to auscultation.  Abdominal: Soft. He exhibits no distension and no mass. There is no tenderness. There is no rebound and no guarding. No hernia.  Musculoskeletal:  No CT or L-spine tenderness  Neurological: [name] alert and oriented to person, place, and time. Coordination normal.  Psychiatric: He has a normal mood and affect. His behavior is normal. Judgment and thought content normal.  Nursing note and vitals reviewed.    ED Treatments / Results  Labs (all labs ordered are listed, but only abnormal results are displayed) Labs Reviewed  BASIC METABOLIC PANEL - Abnormal; Notable for the following:       Result Value   Potassium 3.3 (*)    Glucose, Bld 150 (*)    Creatinine, Ser 1.51 (*)    GFR calc non Af Amer 43 (*)    GFR calc Af Amer 50 (*)    All other components within normal limits  CBC    EKG  EKG Interpretation  Date/Time:  Sunday November 13 2016 12:41:08 EST Ventricular Rate:  80 PR Interval:  134 QRS Duration: 84 QT Interval:  378 QTC Calculation: 435 R Axis:   47 Text Interpretation:  Normal sinus rhythm Normal ECG Confirmed by HAVILAND MD, JULIE ([id number]) on 11/13/2016 1:39:06 PM       Radiology Dg Ribs Unilateral W/chest Right  Result Date: 11/13/2016 CLINICAL DATA:  Syncope.  Fall. EXAM: RIGHT RIBS AND CHEST - 3+ VIEW COMPARISON:  08/28/2011 FINDINGS: No acute fracture or other bone lesions are seen involving the ribs. There is no evidence of pneumothorax or pleural effusion. Diffuse chronic interstitial coarsening identified throughout  both lungs. Heart size and mediastinal contours are within normal limits. IMPRESSION: 1. No displaced rib fractures identified. 2. Chronic interstitial coarsening identified in both lungs. Electronically Signed   By: Kerby Moors M.D.   On: 11/13/2016 13:35    Procedures Procedures (including critical care time)  Medications Ordered in ED Medications  sodium chloride 0.9 % bolus 1,000 mL (0 mLs Intravenous Stopped 11/13/16 1649)     Initial Impression / Assessment and Plan / ED Course  I have reviewed the triage vital signs and the nursing notes.  Pertinent labs & imaging results that were available during my care of the patient were reviewed by me and considered in my medical decision making (see chart for details).  Final Clinical Impressions(s) / ED Diagnoses   Final diagnoses:  Fall, initial encounter  Rib pain    Labs: BMP, CBC  Imaging: DG ribs unilateral with chest  Consults:  Therapeutics: Normal saline  Discharge Meds: MiraLAX  Assessment/Plan: 76 year old male presents status post fall.  Patient having rib pain, no acute fractures noted on exam, clear lung sounds.  Patient also reporting constipation, likely secondary to pain medication.  [name] instructed to use ibuprofen or Tylenol, MiraLAX, return immediately if any new or worsening signs or symptoms present.  Both his wife and him verbalized understanding and agreement to today's plan had no further questions or concerns at time of discharge    New Prescriptions New Prescriptions   POLYETHYLENE GLYCOL (MIRALAX) PACKET    Take 17 g by mouth daily.     Okey Regal, PA-C 11/13/16 1722    Isla Pence, MD 11/14/16 1640

## 2016-11-13 NOTE — ED Notes (Signed)
Pt. Refusing to use urinal. Pt. Insists on using restroom. Pt. Informed on how it is unsafe for him to walk to the restroom right now with him feeling dizzy. Pt. Still insisting on walking to restroom. RN 1x assist. Wife with him in restroom.

## 2016-11-13 NOTE — ED Notes (Signed)
EDP at bedside  

## 2016-11-13 NOTE — ED Notes (Signed)
Pt ambulated around the nurses station with no assistance and had a stable gait.

## 2016-11-15 ENCOUNTER — Ambulatory Visit
Admission: RE | Admit: 2016-11-15 | Discharge: 2016-11-15 | Disposition: A | Discharge status: Home / Self Care | Payer: Medicare PPO | Source: Ambulatory Visit | Attending: Internal Medicine | Admitting: Internal Medicine

## 2016-11-15 ENCOUNTER — Other Ambulatory Visit: Payer: Self-pay | Admitting: Internal Medicine

## 2016-11-15 ENCOUNTER — Other Ambulatory Visit (HOSPITAL_COMMUNITY): Payer: Self-pay | Admitting: Internal Medicine

## 2016-11-15 DIAGNOSIS — R55 Syncope and collapse: Secondary | ICD-10-CM | POA: Diagnosis not present

## 2016-11-15 DIAGNOSIS — K5900 Constipation, unspecified: Secondary | ICD-10-CM

## 2016-11-15 DIAGNOSIS — R110 Nausea: Secondary | ICD-10-CM | POA: Diagnosis not present

## 2016-11-15 DIAGNOSIS — E876 Hypokalemia: Secondary | ICD-10-CM | POA: Diagnosis not present

## 2016-11-15 DIAGNOSIS — R109 Unspecified abdominal pain: Secondary | ICD-10-CM | POA: Diagnosis not present

## 2016-11-15 IMAGING — DX DG ABDOMEN 1V
1 series · 1 of 1 positions shown · non-contrast
Comparison: CT [DATE].

CLINICAL DATA: Abdominal pain.

EXAM:
ABDOMEN - 1 VIEW

[dg abd 1 view]
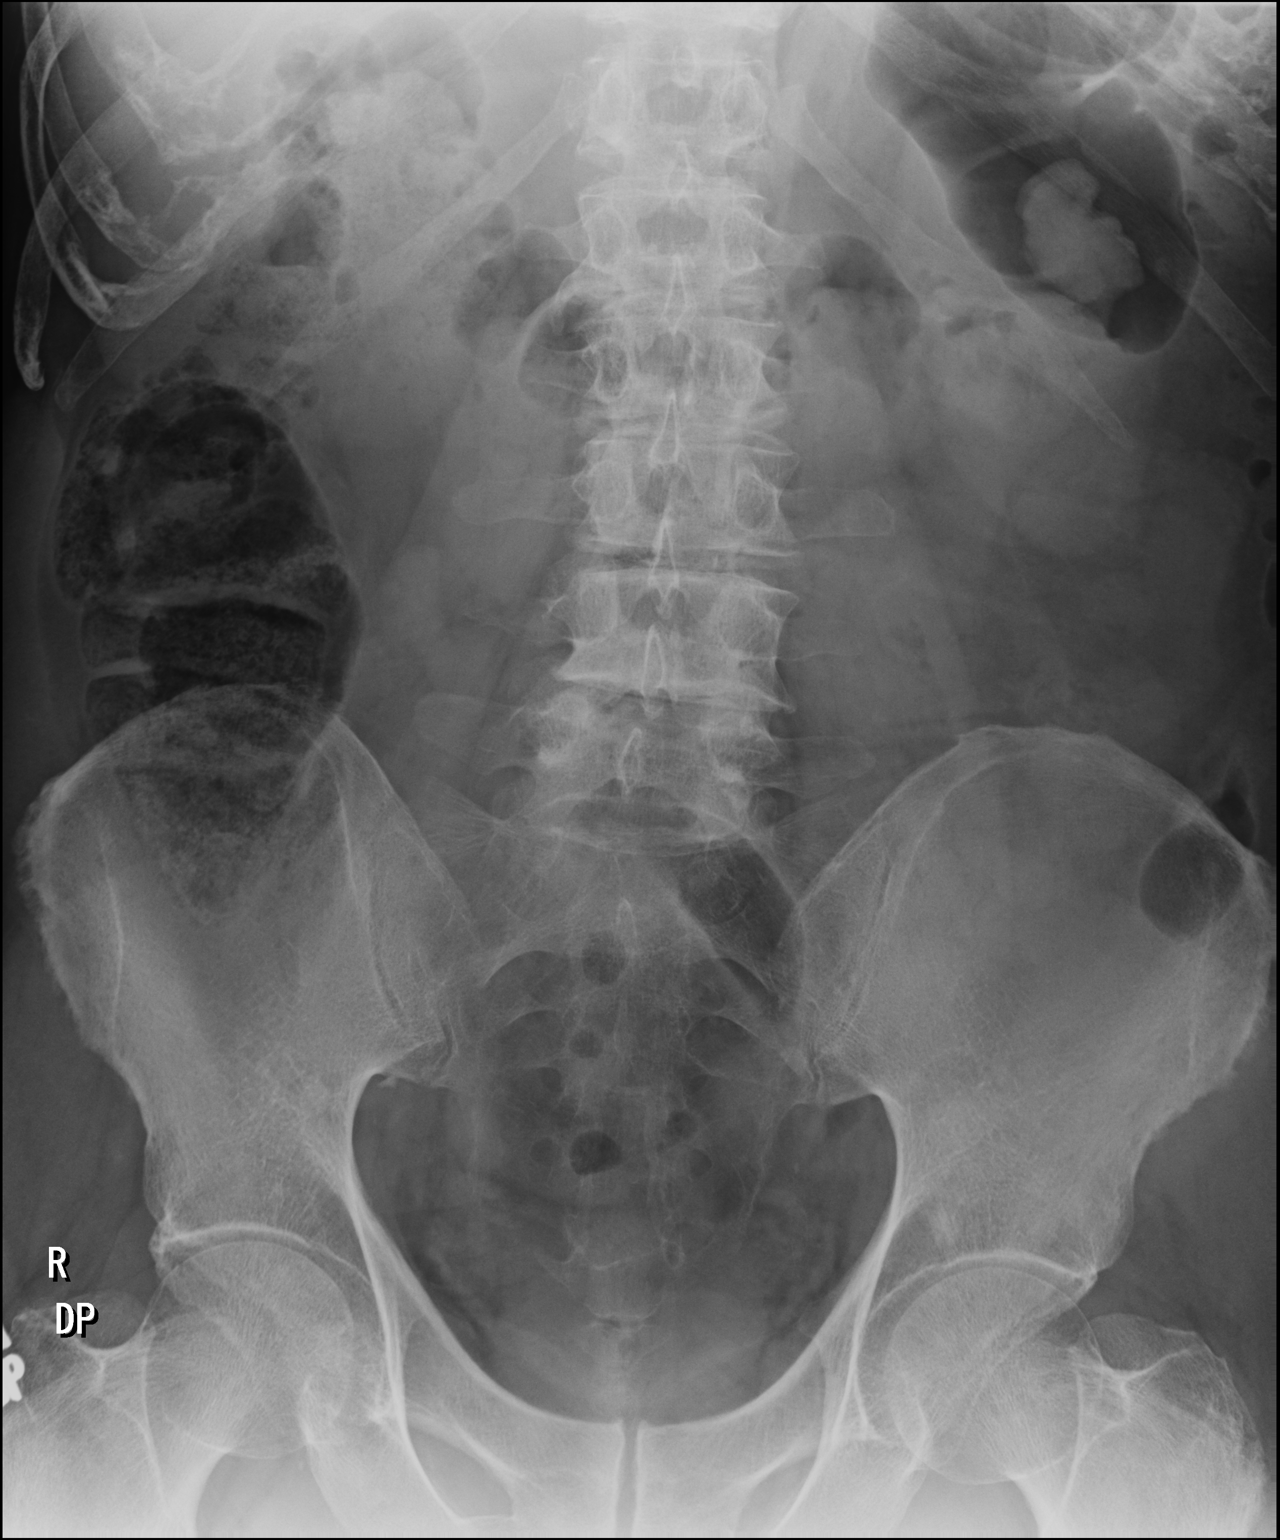

[1 of 1 positions shown; findings below may reference images not displayed]

FINDINGS: Soft tissue structures are unremarkable. No bowel distention. No
free air. Degenerative changes lumbar spine with scoliosis concave
right. Degenerative changes both hips. Stable tiny sclerotic
densities left acetabulum right ilium consistent with bone islands .
No pathologic intra-abdominal calcification.
IMPRESSION: No acute abnormality.

## 2016-11-18 ENCOUNTER — Ambulatory Visit
Admission: RE | Admit: 2016-11-18 | Discharge: 2016-11-18 | Disposition: A | Discharge status: Home / Self Care | Payer: Medicare PPO | Source: Ambulatory Visit | Attending: Internal Medicine | Admitting: Internal Medicine

## 2016-11-18 DIAGNOSIS — I6523 Occlusion and stenosis of bilateral carotid arteries: Secondary | ICD-10-CM | POA: Diagnosis not present

## 2016-11-18 DIAGNOSIS — R55 Syncope and collapse: Secondary | ICD-10-CM

## 2016-11-21 ENCOUNTER — Ambulatory Visit (HOSPITAL_COMMUNITY)
Admission: RE | Admit: 2016-11-21 | Discharge: 2016-11-21 | Disposition: A | Discharge status: Home / Self Care | Payer: Medicare PPO | Source: Ambulatory Visit | Attending: Internal Medicine | Admitting: Internal Medicine

## 2016-11-21 DIAGNOSIS — I340 Nonrheumatic mitral (valve) insufficiency: Secondary | ICD-10-CM | POA: Diagnosis not present

## 2016-11-21 DIAGNOSIS — R0600 Dyspnea, unspecified: Secondary | ICD-10-CM | POA: Insufficient documentation

## 2016-11-21 DIAGNOSIS — R55 Syncope and collapse: Secondary | ICD-10-CM | POA: Insufficient documentation

## 2016-11-21 DIAGNOSIS — I071 Rheumatic tricuspid insufficiency: Secondary | ICD-10-CM | POA: Insufficient documentation

## 2016-11-21 NOTE — Progress Notes (Signed)
  Echocardiogram 2D Echocardiogram has been performed.  Bobbye Charleston 11/21/2016, 10:41 AM

## 2016-11-23 ENCOUNTER — Telehealth: Payer: Self-pay | Admitting: Cardiovascular Disease

## 2016-11-23 NOTE — Telephone Encounter (Signed)
New message      Dr Delfina Redwood want to talk to Dr Oval Linsey about the echo she read on this pt.  Please call his cell at [phone].  Dr Delfina Redwood is only in the office this am until 73

## 2016-11-23 NOTE — Telephone Encounter (Signed)
Spoke with Dr Delfina Redwood He wanted to know given the moderate, mobile atheromatous plaque in the aortic arch should the patient be anticoagulated [name] aware Dr Oval Linsey will not be back in the office until tomorrow

## 2016-11-25 DIAGNOSIS — L989 Disorder of the skin and subcutaneous tissue, unspecified: Secondary | ICD-10-CM | POA: Diagnosis not present

## 2016-11-25 DIAGNOSIS — I6529 Occlusion and stenosis of unspecified carotid artery: Secondary | ICD-10-CM | POA: Diagnosis not present

## 2016-11-25 DIAGNOSIS — I700 Atherosclerosis of aorta: Secondary | ICD-10-CM | POA: Diagnosis not present

## 2016-11-25 DIAGNOSIS — R55 Syncope and collapse: Secondary | ICD-10-CM | POA: Diagnosis not present

## 2016-11-25 DIAGNOSIS — E039 Hypothyroidism, unspecified: Secondary | ICD-10-CM | POA: Diagnosis not present

## 2016-12-01 ENCOUNTER — Other Ambulatory Visit (HOSPITAL_COMMUNITY): Payer: Medicare Other

## 2016-12-02 NOTE — Telephone Encounter (Signed)
Dr Oval Linsey spoke with Dr Delfina Redwood

## 2016-12-05 DIAGNOSIS — E291 Testicular hypofunction: Secondary | ICD-10-CM | POA: Diagnosis not present

## 2016-12-08 DIAGNOSIS — L820 Inflamed seborrheic keratosis: Secondary | ICD-10-CM | POA: Diagnosis not present

## 2017-01-09 DIAGNOSIS — N400 Enlarged prostate without lower urinary tract symptoms: Secondary | ICD-10-CM | POA: Diagnosis not present

## 2017-01-09 DIAGNOSIS — Z0000 Encounter for general adult medical examination without abnormal findings: Secondary | ICD-10-CM | POA: Diagnosis not present

## 2017-01-09 DIAGNOSIS — I350 Nonrheumatic aortic (valve) stenosis: Secondary | ICD-10-CM | POA: Diagnosis not present

## 2017-01-09 DIAGNOSIS — F3341 Major depressive disorder, recurrent, in partial remission: Secondary | ICD-10-CM | POA: Diagnosis not present

## 2017-01-09 DIAGNOSIS — Z1389 Encounter for screening for other disorder: Secondary | ICD-10-CM | POA: Diagnosis not present

## 2017-01-09 DIAGNOSIS — I700 Atherosclerosis of aorta: Secondary | ICD-10-CM | POA: Diagnosis not present

## 2017-01-09 DIAGNOSIS — E7800 Pure hypercholesterolemia, unspecified: Secondary | ICD-10-CM | POA: Diagnosis not present

## 2017-01-09 DIAGNOSIS — E039 Hypothyroidism, unspecified: Secondary | ICD-10-CM | POA: Diagnosis not present

## 2017-01-09 DIAGNOSIS — I6529 Occlusion and stenosis of unspecified carotid artery: Secondary | ICD-10-CM | POA: Diagnosis not present

## 2017-02-09 DIAGNOSIS — E291 Testicular hypofunction: Secondary | ICD-10-CM | POA: Diagnosis not present

## 2017-02-15 DIAGNOSIS — N400 Enlarged prostate without lower urinary tract symptoms: Secondary | ICD-10-CM | POA: Diagnosis not present

## 2017-02-15 DIAGNOSIS — E663 Overweight: Secondary | ICD-10-CM | POA: Diagnosis not present

## 2017-02-15 DIAGNOSIS — E785 Hyperlipidemia, unspecified: Secondary | ICD-10-CM | POA: Diagnosis not present

## 2017-02-15 DIAGNOSIS — F3340 Major depressive disorder, recurrent, in remission, unspecified: Secondary | ICD-10-CM | POA: Diagnosis not present

## 2017-02-15 DIAGNOSIS — H269 Unspecified cataract: Secondary | ICD-10-CM | POA: Diagnosis not present

## 2017-02-15 DIAGNOSIS — I10 Essential (primary) hypertension: Secondary | ICD-10-CM | POA: Diagnosis not present

## 2017-02-15 DIAGNOSIS — M545 Low back pain: Secondary | ICD-10-CM | POA: Diagnosis not present

## 2017-02-15 DIAGNOSIS — H9190 Unspecified hearing loss, unspecified ear: Secondary | ICD-10-CM | POA: Diagnosis not present

## 2017-02-15 DIAGNOSIS — E039 Hypothyroidism, unspecified: Secondary | ICD-10-CM | POA: Diagnosis not present

## 2017-03-10 DIAGNOSIS — E291 Testicular hypofunction: Secondary | ICD-10-CM | POA: Diagnosis not present

## 2017-04-10 DIAGNOSIS — E291 Testicular hypofunction: Secondary | ICD-10-CM | POA: Diagnosis not present

## 2017-05-12 DIAGNOSIS — E291 Testicular hypofunction: Secondary | ICD-10-CM | POA: Diagnosis not present

## 2017-06-13 DIAGNOSIS — E291 Testicular hypofunction: Secondary | ICD-10-CM | POA: Diagnosis not present

## 2017-07-14 DIAGNOSIS — E7800 Pure hypercholesterolemia, unspecified: Secondary | ICD-10-CM | POA: Diagnosis not present

## 2017-07-14 DIAGNOSIS — I6529 Occlusion and stenosis of unspecified carotid artery: Secondary | ICD-10-CM | POA: Diagnosis not present

## 2017-07-14 DIAGNOSIS — I350 Nonrheumatic aortic (valve) stenosis: Secondary | ICD-10-CM | POA: Diagnosis not present

## 2017-07-14 DIAGNOSIS — E291 Testicular hypofunction: Secondary | ICD-10-CM | POA: Diagnosis not present

## 2017-07-14 DIAGNOSIS — E039 Hypothyroidism, unspecified: Secondary | ICD-10-CM | POA: Diagnosis not present

## 2017-07-14 DIAGNOSIS — F3341 Major depressive disorder, recurrent, in partial remission: Secondary | ICD-10-CM | POA: Diagnosis not present

## 2017-07-14 DIAGNOSIS — Z23 Encounter for immunization: Secondary | ICD-10-CM | POA: Diagnosis not present

## 2017-07-14 DIAGNOSIS — I700 Atherosclerosis of aorta: Secondary | ICD-10-CM | POA: Diagnosis not present

## 2017-07-14 DIAGNOSIS — R739 Hyperglycemia, unspecified: Secondary | ICD-10-CM | POA: Diagnosis not present

## 2017-08-14 DIAGNOSIS — E291 Testicular hypofunction: Secondary | ICD-10-CM | POA: Diagnosis not present

## 2017-08-15 IMAGING — US US CAROTID DUPLEX BILAT
1 series · 13 of 24 positions shown · non-contrast
Comparison: None.

CLINICAL DATA: Syncopal episode. History of hypertension and
hyperlipidemia. Former smoker.

EXAM:
BILATERAL CAROTID DUPLEX ULTRASOUND
TECHNIQUE: Gray scale imaging, color Doppler and duplex ultrasound were
performed of bilateral carotid and vertebral arteries in the neck.

[Series 1: us carotid duplex bilat · 0.05mm/px · 13 of 47 slices shown]
[im 1/47]
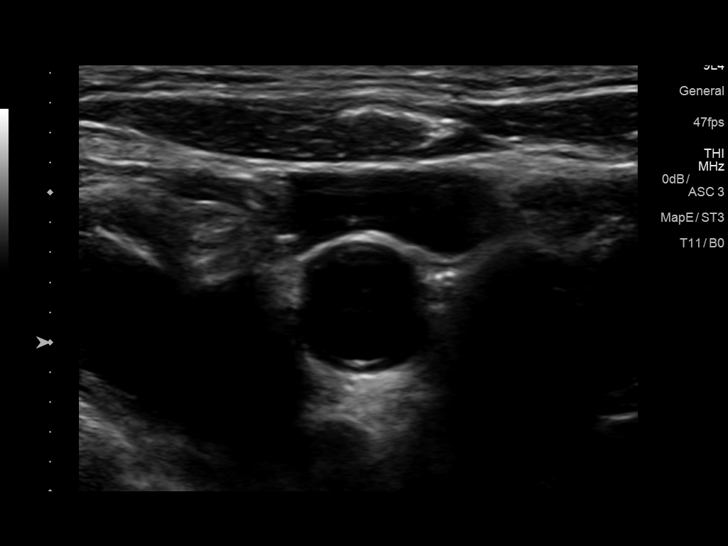
[im 5/47]
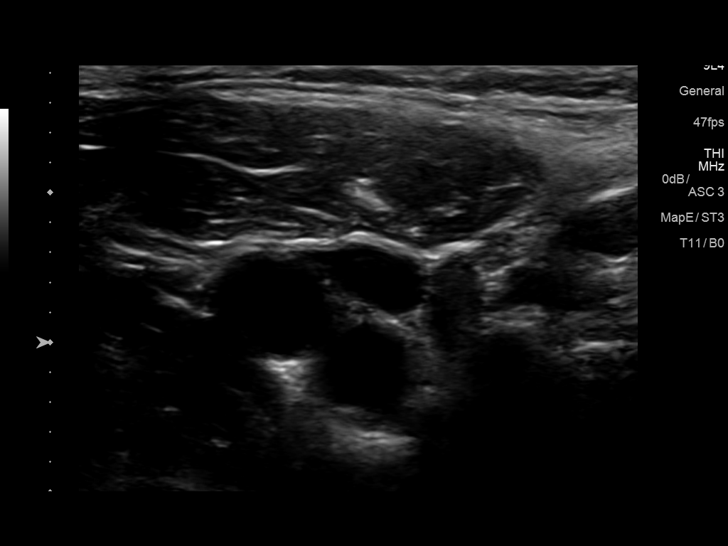
[im 9/47]
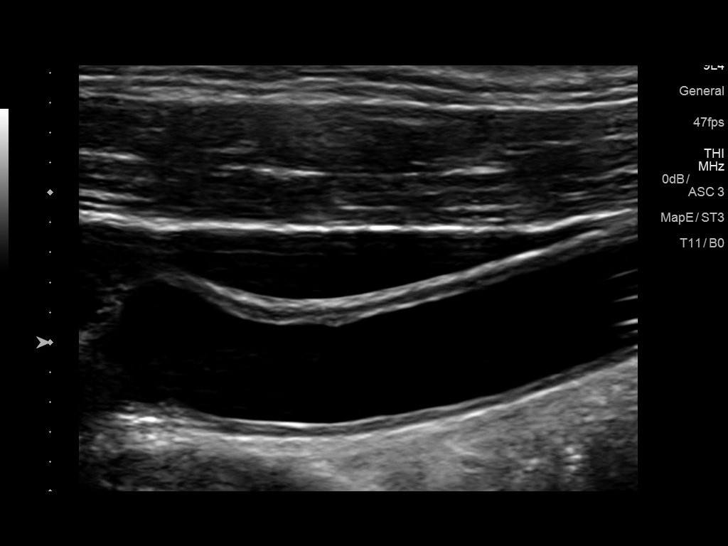
[im 13/47]
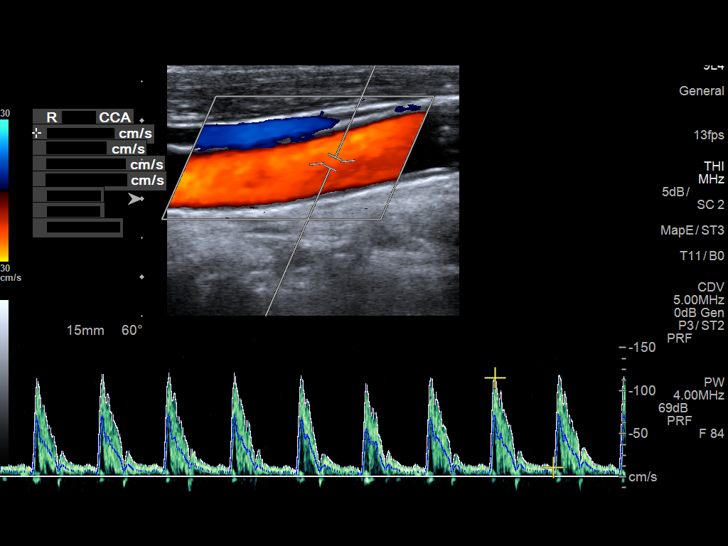
[im 17/47]
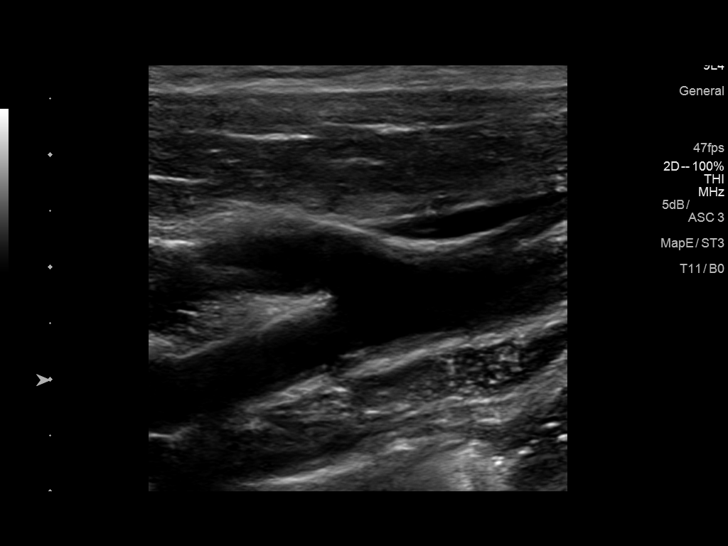
[im 21/47]
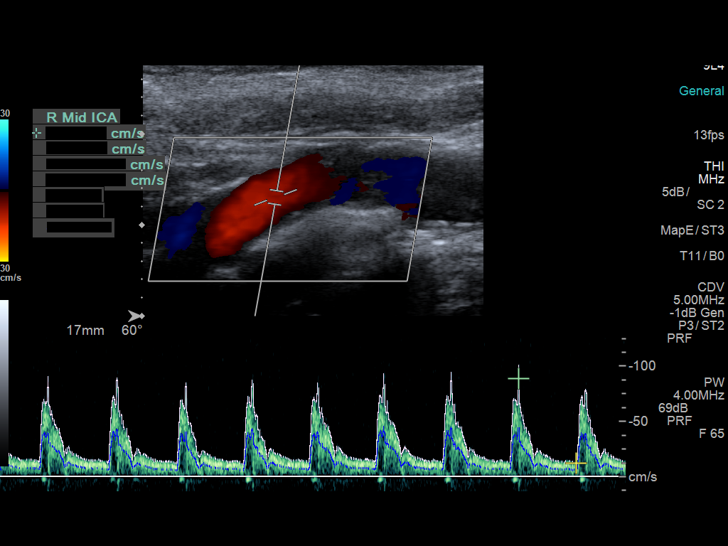
[im 25/47]
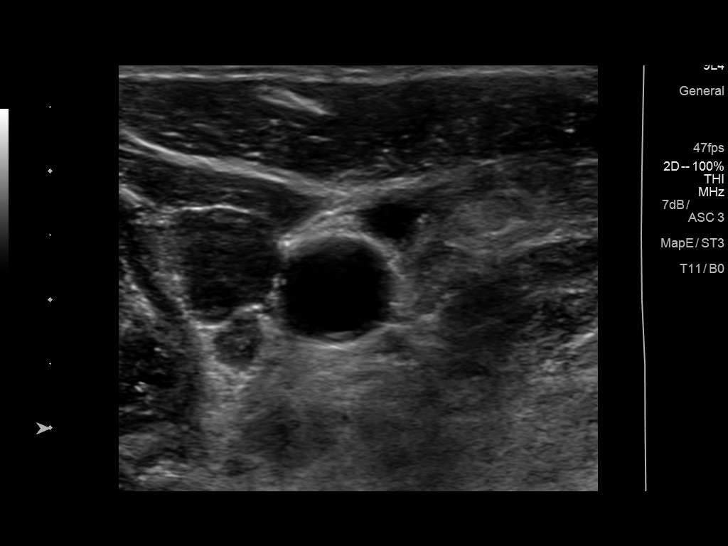
[im 27/47]
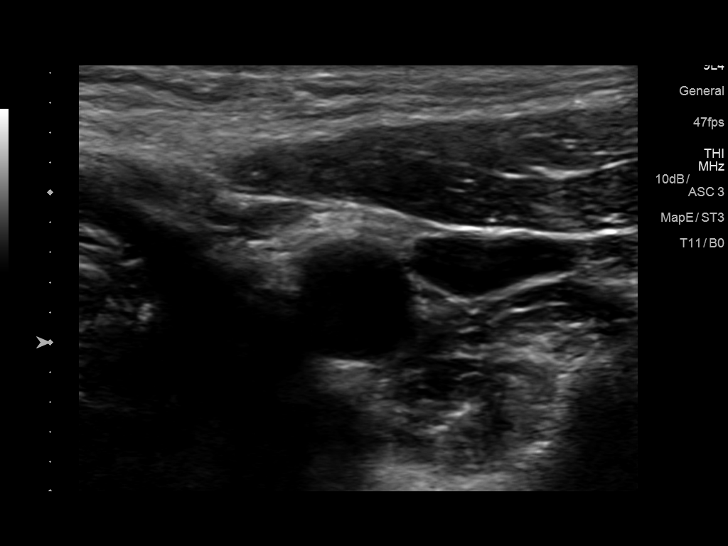
[im 31/47]
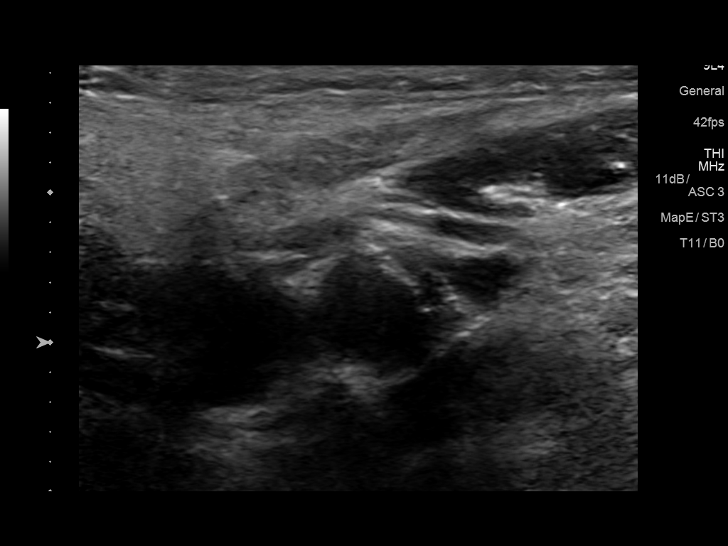
[im 35/47]
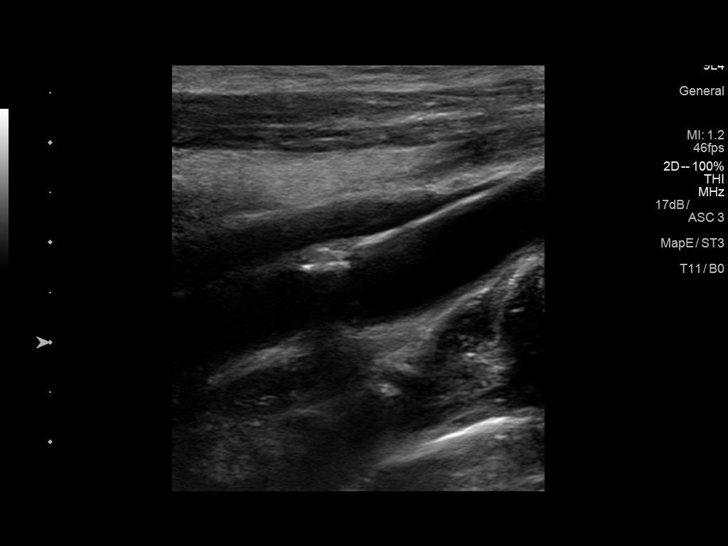
[im 39/47]
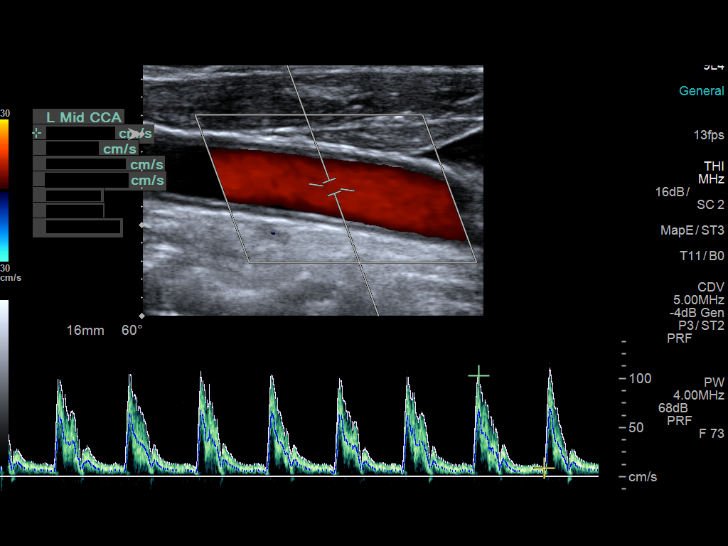
[im 43/47]
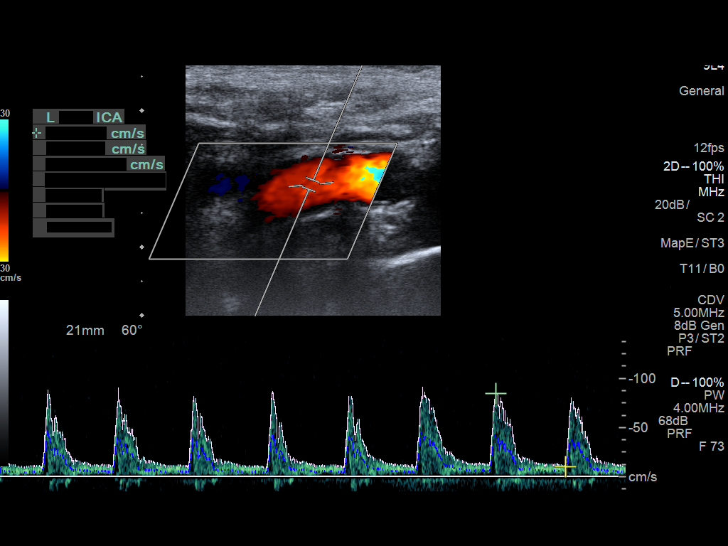
[im 47/47]
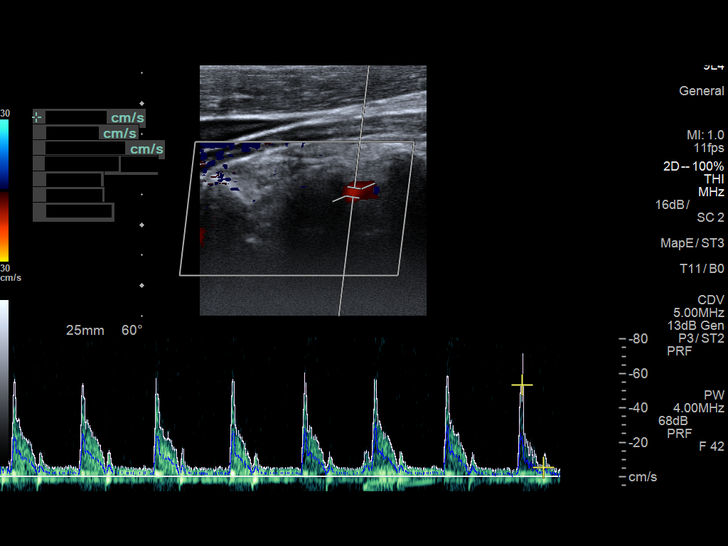

[13 of 24 positions shown; findings below may reference images not displayed]

FINDINGS: Criteria: Quantification of carotid stenosis is based on velocity
parameters that correlate the residual internal carotid diameter
with NASCET-based stenosis levels, using the diameter of the distal
internal carotid lumen as the denominator for stenosis measurement.

The following velocity measurements were obtained:

RIGHT

ICA:  89/12 cm/sec

CCA:  115/11 cm/sec

SYSTOLIC ICA/CCA RATIO:

DIASTOLIC ICA/CCA RATIO:

ECA:  127 cm/sec

LEFT

ICA:  85/10 cm/sec

CCA:  103/9 cm/sec

SYSTOLIC ICA/CCA RATIO:

DIASTOLIC ICA/CCA RATIO:

ECA:  86 cm/sec

RIGHT CAROTID ARTERY: There is a minimal amount of eccentric mixed
echogenic plaque within the right carotid bulb (images 7 and 13),
not resulting in elevated peak systolic velocities within the
interrogated course the right internal carotid artery to suggest a
hemodynamically significant stenosis.

RIGHT VERTEBRAL ARTERY:  Antegrade flow

LEFT CAROTID ARTERY: There is a moderate amount of eccentric mixed
echogenic plaque within the left carotid bulb (images 32 and 38),
extending to involve the origin and proximal aspects of the left
internal carotid artery (image 40), not resulting in elevated peak
systolic velocities within the interrogated course the left internal
carotid artery to suggest a hemodynamically significant stenosis.

LEFT VERTEBRAL ARTERY:  Antegrade flow
IMPRESSION: Minimal to moderate amount of bilateral atherosclerotic plaque,
right greater than left, not resulting in a hemodynamically
significant stenosis within either internal carotid artery.

## 2017-08-24 DIAGNOSIS — K649 Unspecified hemorrhoids: Secondary | ICD-10-CM | POA: Diagnosis not present

## 2017-09-14 DIAGNOSIS — E291 Testicular hypofunction: Secondary | ICD-10-CM | POA: Diagnosis not present

## 2017-10-01 DIAGNOSIS — Z6825 Body mass index (BMI) 25.0-25.9, adult: Secondary | ICD-10-CM | POA: Diagnosis not present

## 2017-10-01 DIAGNOSIS — F418 Other specified anxiety disorders: Secondary | ICD-10-CM | POA: Diagnosis not present

## 2017-10-01 DIAGNOSIS — E785 Hyperlipidemia, unspecified: Secondary | ICD-10-CM | POA: Diagnosis not present

## 2017-10-01 DIAGNOSIS — E039 Hypothyroidism, unspecified: Secondary | ICD-10-CM | POA: Diagnosis not present

## 2017-10-01 DIAGNOSIS — H9190 Unspecified hearing loss, unspecified ear: Secondary | ICD-10-CM | POA: Diagnosis not present

## 2017-10-01 DIAGNOSIS — Z87891 Personal history of nicotine dependence: Secondary | ICD-10-CM | POA: Diagnosis not present

## 2017-10-01 DIAGNOSIS — I10 Essential (primary) hypertension: Secondary | ICD-10-CM | POA: Diagnosis not present

## 2017-10-16 DIAGNOSIS — E291 Testicular hypofunction: Secondary | ICD-10-CM | POA: Diagnosis not present

## 2017-11-16 DIAGNOSIS — E291 Testicular hypofunction: Secondary | ICD-10-CM | POA: Diagnosis not present

## 2017-12-06 DIAGNOSIS — T8789 Other complications of amputation stump: Secondary | ICD-10-CM | POA: Diagnosis not present

## 2017-12-06 DIAGNOSIS — S68113A Complete traumatic metacarpophalangeal amputation of left middle finger, initial encounter: Secondary | ICD-10-CM | POA: Diagnosis not present

## 2017-12-06 DIAGNOSIS — M79609 Pain in unspecified limb: Secondary | ICD-10-CM | POA: Diagnosis not present

## 2017-12-07 ENCOUNTER — Other Ambulatory Visit: Payer: Self-pay | Admitting: Orthopedic Surgery

## 2017-12-15 DIAGNOSIS — E291 Testicular hypofunction: Secondary | ICD-10-CM | POA: Diagnosis not present

## 2017-12-22 DIAGNOSIS — N50819 Testicular pain, unspecified: Secondary | ICD-10-CM | POA: Diagnosis not present

## 2017-12-25 ENCOUNTER — Ambulatory Visit (HOSPITAL_BASED_OUTPATIENT_CLINIC_OR_DEPARTMENT_OTHER): Admit: 2017-12-25 | Payer: Medicare PPO | Admitting: Orthopedic Surgery

## 2017-12-25 ENCOUNTER — Encounter (HOSPITAL_BASED_OUTPATIENT_CLINIC_OR_DEPARTMENT_OTHER): Payer: Self-pay

## 2017-12-25 SURGERY — AMPUTATION DIGIT
Anesthesia: Choice | Laterality: Left

## 2018-01-18 DIAGNOSIS — I700 Atherosclerosis of aorta: Secondary | ICD-10-CM | POA: Diagnosis not present

## 2018-01-18 DIAGNOSIS — R5383 Other fatigue: Secondary | ICD-10-CM | POA: Diagnosis not present

## 2018-01-18 DIAGNOSIS — I6529 Occlusion and stenosis of unspecified carotid artery: Secondary | ICD-10-CM | POA: Diagnosis not present

## 2018-01-18 DIAGNOSIS — E291 Testicular hypofunction: Secondary | ICD-10-CM | POA: Diagnosis not present

## 2018-01-18 DIAGNOSIS — Z1389 Encounter for screening for other disorder: Secondary | ICD-10-CM | POA: Diagnosis not present

## 2018-01-18 DIAGNOSIS — E7800 Pure hypercholesterolemia, unspecified: Secondary | ICD-10-CM | POA: Diagnosis not present

## 2018-01-18 DIAGNOSIS — E039 Hypothyroidism, unspecified: Secondary | ICD-10-CM | POA: Diagnosis not present

## 2018-01-18 DIAGNOSIS — I350 Nonrheumatic aortic (valve) stenosis: Secondary | ICD-10-CM | POA: Diagnosis not present

## 2018-01-18 DIAGNOSIS — Z0000 Encounter for general adult medical examination without abnormal findings: Secondary | ICD-10-CM | POA: Diagnosis not present

## 2018-01-18 DIAGNOSIS — R7989 Other specified abnormal findings of blood chemistry: Secondary | ICD-10-CM | POA: Diagnosis not present

## 2018-01-18 DIAGNOSIS — F3341 Major depressive disorder, recurrent, in partial remission: Secondary | ICD-10-CM | POA: Diagnosis not present

## 2018-01-18 DIAGNOSIS — N400 Enlarged prostate without lower urinary tract symptoms: Secondary | ICD-10-CM | POA: Diagnosis not present

## 2018-02-19 DIAGNOSIS — E291 Testicular hypofunction: Secondary | ICD-10-CM | POA: Diagnosis not present

## 2018-03-01 DIAGNOSIS — D696 Thrombocytopenia, unspecified: Secondary | ICD-10-CM | POA: Diagnosis not present

## 2018-03-01 DIAGNOSIS — E039 Hypothyroidism, unspecified: Secondary | ICD-10-CM | POA: Diagnosis not present

## 2018-03-01 DIAGNOSIS — R739 Hyperglycemia, unspecified: Secondary | ICD-10-CM | POA: Diagnosis not present

## 2018-03-21 DIAGNOSIS — E291 Testicular hypofunction: Secondary | ICD-10-CM | POA: Diagnosis not present

## 2018-04-23 DIAGNOSIS — E291 Testicular hypofunction: Secondary | ICD-10-CM | POA: Diagnosis not present

## 2018-06-12 DIAGNOSIS — E291 Testicular hypofunction: Secondary | ICD-10-CM | POA: Diagnosis not present

## 2018-07-13 DIAGNOSIS — Z23 Encounter for immunization: Secondary | ICD-10-CM | POA: Diagnosis not present

## 2018-07-13 DIAGNOSIS — E291 Testicular hypofunction: Secondary | ICD-10-CM | POA: Diagnosis not present

## 2018-07-24 DIAGNOSIS — N5314 Retrograde ejaculation: Secondary | ICD-10-CM | POA: Diagnosis not present

## 2018-07-24 DIAGNOSIS — E039 Hypothyroidism, unspecified: Secondary | ICD-10-CM | POA: Diagnosis not present

## 2018-07-24 DIAGNOSIS — E291 Testicular hypofunction: Secondary | ICD-10-CM | POA: Diagnosis not present

## 2018-07-24 DIAGNOSIS — I350 Nonrheumatic aortic (valve) stenosis: Secondary | ICD-10-CM | POA: Diagnosis not present

## 2018-07-24 DIAGNOSIS — N3941 Urge incontinence: Secondary | ICD-10-CM | POA: Diagnosis not present

## 2018-07-24 DIAGNOSIS — E7800 Pure hypercholesterolemia, unspecified: Secondary | ICD-10-CM | POA: Diagnosis not present

## 2018-07-24 DIAGNOSIS — F3341 Major depressive disorder, recurrent, in partial remission: Secondary | ICD-10-CM | POA: Diagnosis not present

## 2018-07-24 DIAGNOSIS — I6529 Occlusion and stenosis of unspecified carotid artery: Secondary | ICD-10-CM | POA: Diagnosis not present

## 2018-08-14 DIAGNOSIS — E291 Testicular hypofunction: Secondary | ICD-10-CM | POA: Diagnosis not present

## 2018-09-14 DIAGNOSIS — E291 Testicular hypofunction: Secondary | ICD-10-CM | POA: Diagnosis not present

## 2018-10-15 DIAGNOSIS — E291 Testicular hypofunction: Secondary | ICD-10-CM | POA: Diagnosis not present

## 2018-11-15 DIAGNOSIS — E291 Testicular hypofunction: Secondary | ICD-10-CM | POA: Diagnosis not present

## 2019-01-15 DIAGNOSIS — E291 Testicular hypofunction: Secondary | ICD-10-CM | POA: Diagnosis not present

## 2019-02-14 DIAGNOSIS — Z0000 Encounter for general adult medical examination without abnormal findings: Secondary | ICD-10-CM | POA: Diagnosis not present

## 2019-02-14 DIAGNOSIS — Z1389 Encounter for screening for other disorder: Secondary | ICD-10-CM | POA: Diagnosis not present

## 2019-02-15 DIAGNOSIS — E291 Testicular hypofunction: Secondary | ICD-10-CM | POA: Diagnosis not present

## 2019-02-15 DIAGNOSIS — D696 Thrombocytopenia, unspecified: Secondary | ICD-10-CM | POA: Diagnosis not present

## 2019-02-15 DIAGNOSIS — E7800 Pure hypercholesterolemia, unspecified: Secondary | ICD-10-CM | POA: Diagnosis not present

## 2019-02-15 DIAGNOSIS — I6529 Occlusion and stenosis of unspecified carotid artery: Secondary | ICD-10-CM | POA: Diagnosis not present

## 2019-02-15 DIAGNOSIS — E039 Hypothyroidism, unspecified: Secondary | ICD-10-CM | POA: Diagnosis not present

## 2019-02-15 DIAGNOSIS — I700 Atherosclerosis of aorta: Secondary | ICD-10-CM | POA: Diagnosis not present

## 2019-02-21 DIAGNOSIS — E039 Hypothyroidism, unspecified: Secondary | ICD-10-CM | POA: Diagnosis not present

## 2019-02-21 DIAGNOSIS — I10 Essential (primary) hypertension: Secondary | ICD-10-CM | POA: Diagnosis not present

## 2019-02-21 DIAGNOSIS — F419 Anxiety disorder, unspecified: Secondary | ICD-10-CM | POA: Diagnosis not present

## 2019-02-21 DIAGNOSIS — R7989 Other specified abnormal findings of blood chemistry: Secondary | ICD-10-CM | POA: Diagnosis not present

## 2019-02-21 DIAGNOSIS — N183 Chronic kidney disease, stage 3 (moderate): Secondary | ICD-10-CM | POA: Diagnosis not present

## 2019-03-19 DIAGNOSIS — E291 Testicular hypofunction: Secondary | ICD-10-CM | POA: Diagnosis not present

## 2019-03-19 DIAGNOSIS — R7989 Other specified abnormal findings of blood chemistry: Secondary | ICD-10-CM | POA: Diagnosis not present

## 2019-03-29 ENCOUNTER — Other Ambulatory Visit: Payer: Self-pay | Admitting: Internal Medicine

## 2019-03-29 DIAGNOSIS — N183 Chronic kidney disease, stage 3 unspecified: Secondary | ICD-10-CM

## 2019-03-29 DIAGNOSIS — H9190 Unspecified hearing loss, unspecified ear: Secondary | ICD-10-CM | POA: Diagnosis not present

## 2019-04-01 ENCOUNTER — Ambulatory Visit
Admission: RE | Admit: 2019-04-01 | Discharge: 2019-04-01 | Disposition: A | Discharge status: Home / Self Care | Payer: Medicare PPO | Source: Ambulatory Visit | Attending: Internal Medicine | Admitting: Internal Medicine

## 2019-04-01 DIAGNOSIS — N400 Enlarged prostate without lower urinary tract symptoms: Secondary | ICD-10-CM | POA: Diagnosis not present

## 2019-04-01 DIAGNOSIS — N189 Chronic kidney disease, unspecified: Secondary | ICD-10-CM | POA: Diagnosis not present

## 2019-04-01 DIAGNOSIS — N183 Chronic kidney disease, stage 3 unspecified: Secondary | ICD-10-CM

## 2019-04-01 DIAGNOSIS — I129 Hypertensive chronic kidney disease with stage 1 through stage 4 chronic kidney disease, or unspecified chronic kidney disease: Secondary | ICD-10-CM | POA: Diagnosis not present

## 2019-04-01 IMAGING — US US RENAL
1 series · 14 of 25 positions shown · non-contrast
Comparison: CT, [DATE].

CLINICAL DATA: Chronic kidney disease.  Hypertension.

EXAM:
RENAL / URINARY TRACT ULTRASOUND COMPLETE

[Series 1: us renal · 0.19mm/px · 14 of 42 slices shown]
[im 1/42]
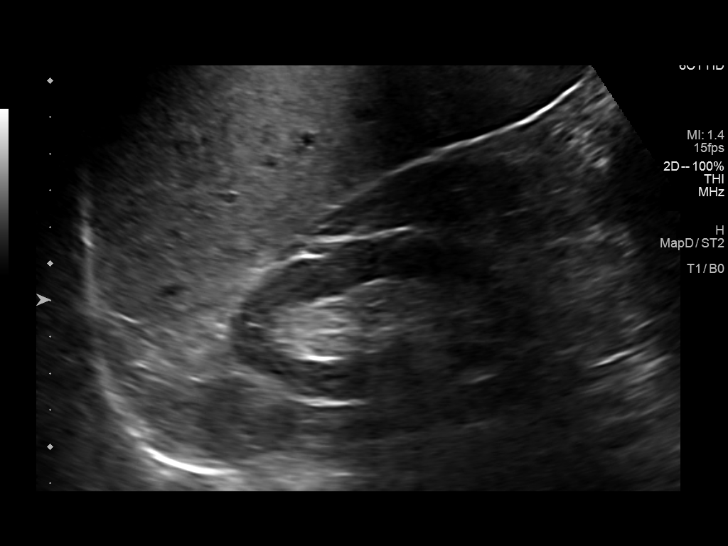
[im 4/42]
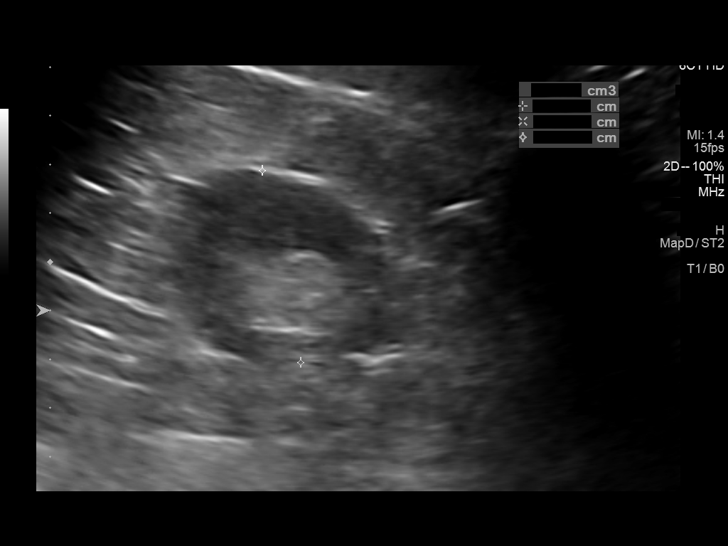
[im 7/42]
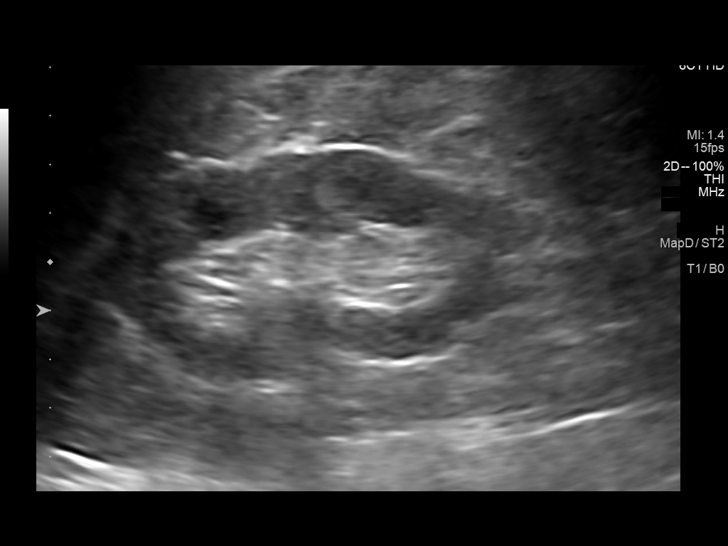
[im 11/42]
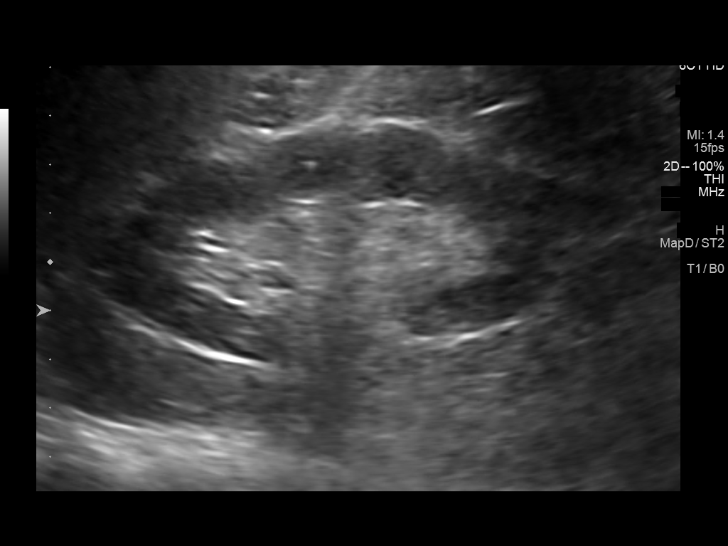
[im 14/42]
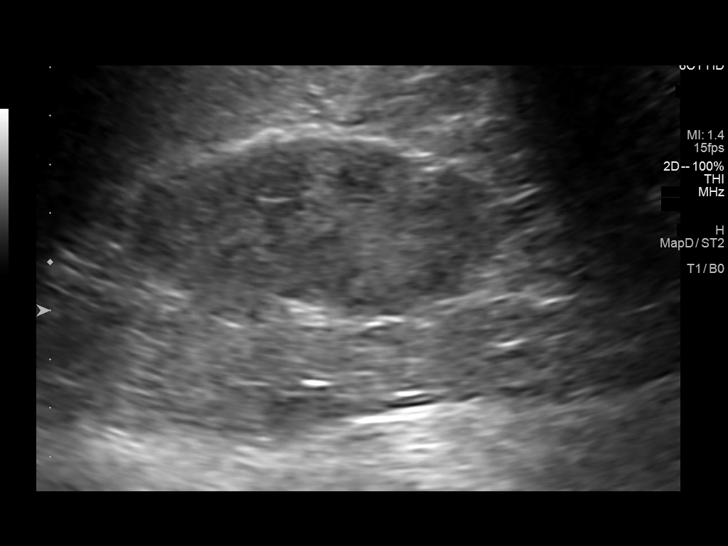
[im 16/42]
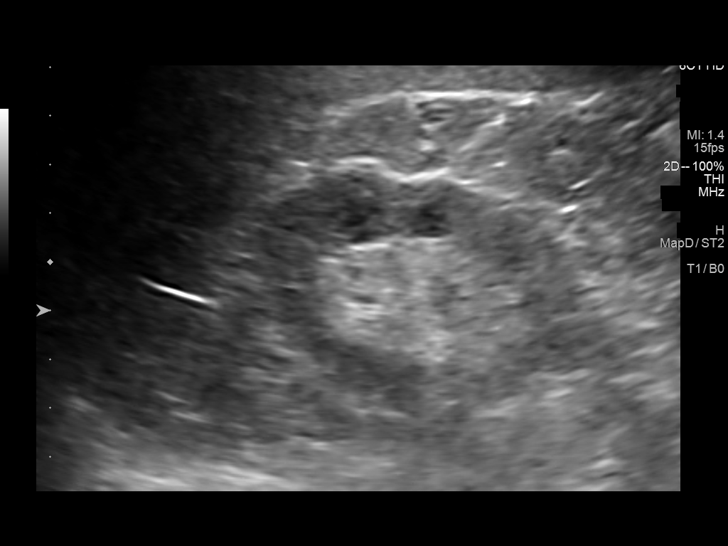
[im 19/42]
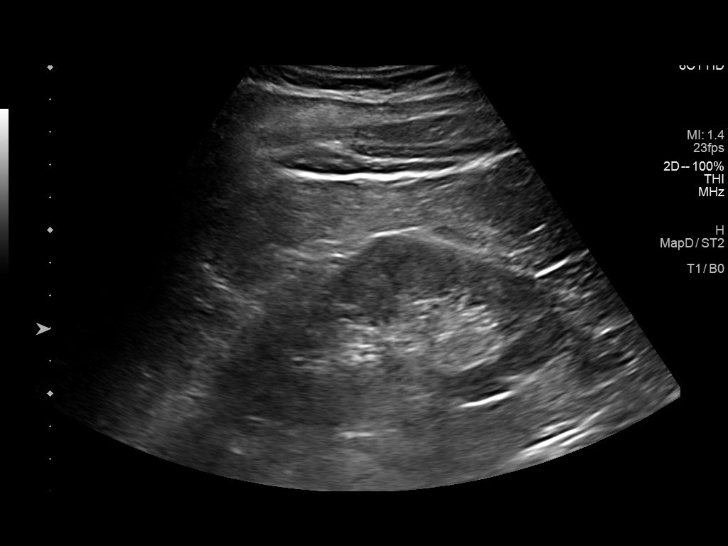
[im 23/42]
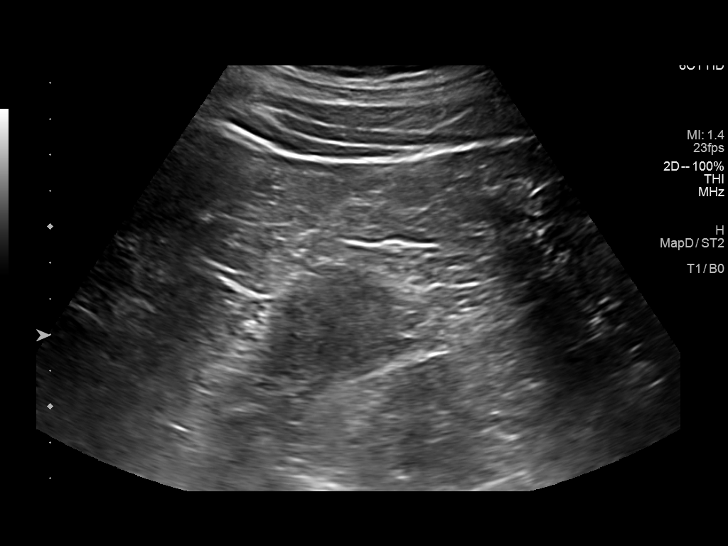
[im 26/42]
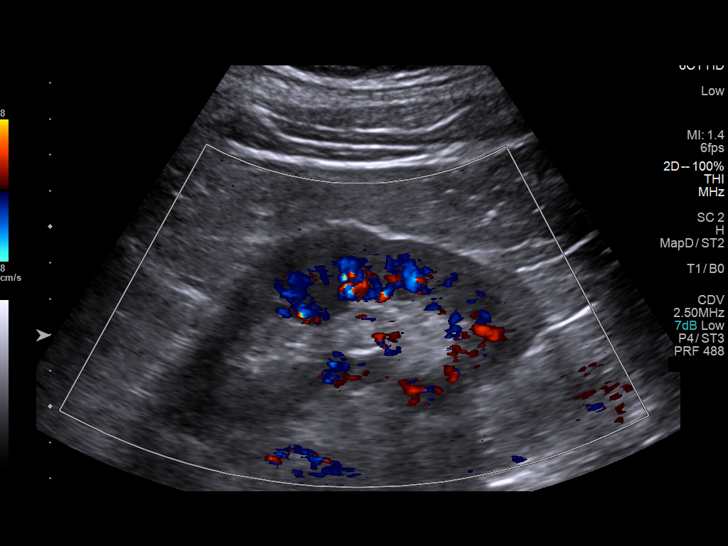
[im 28/42]
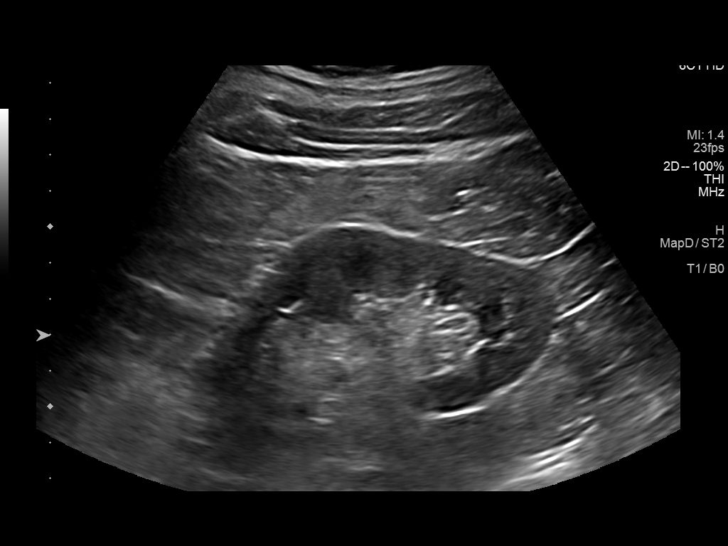
[im 31/42]
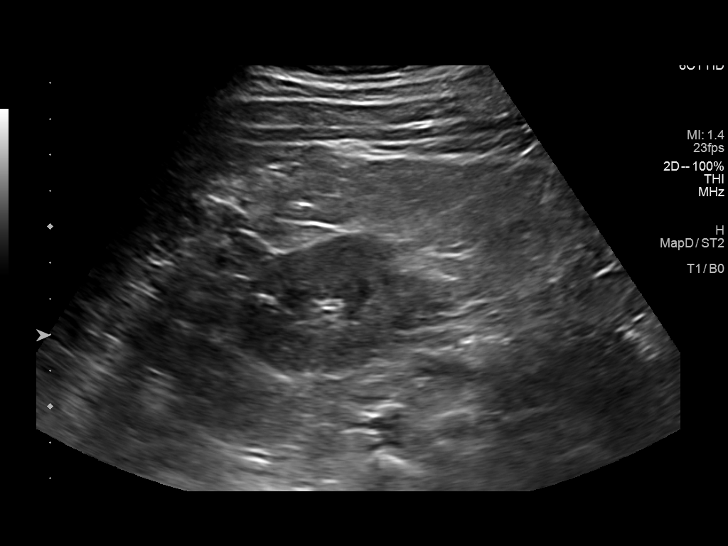
[im 35/42]
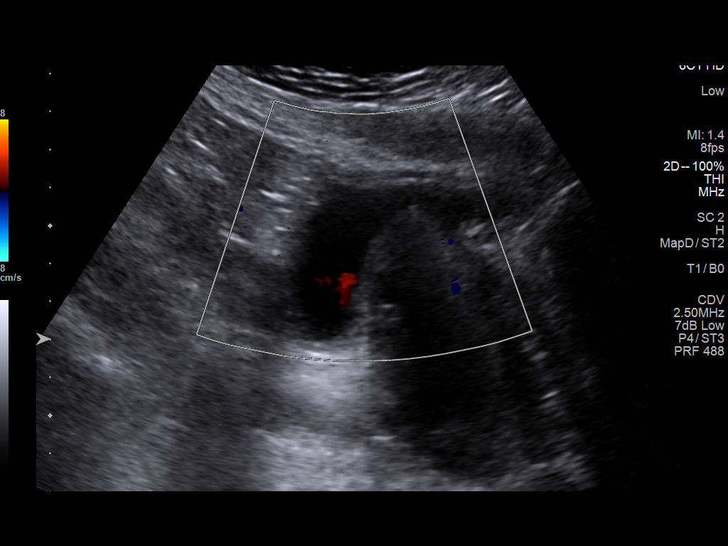
[im 38/42]
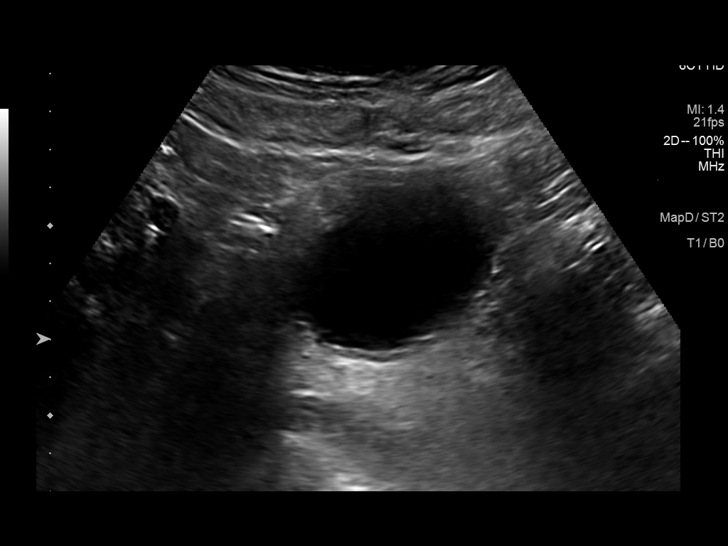
[im 42/42]
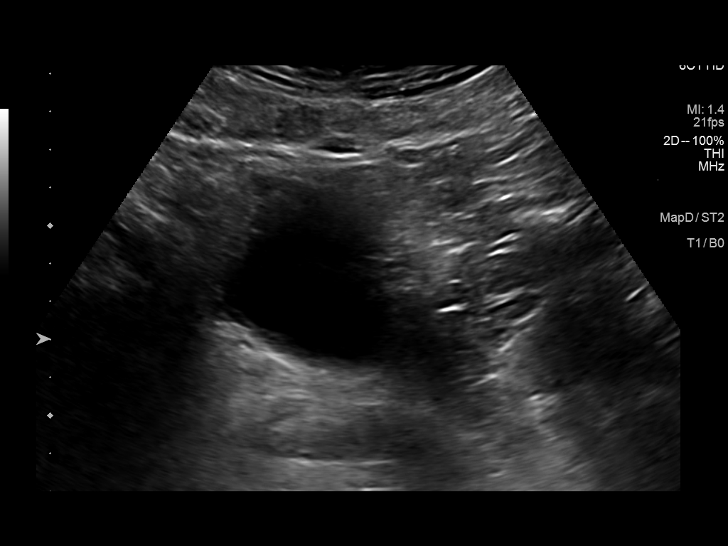

[14 of 25 positions shown; findings below may reference images not displayed]

FINDINGS: Right Kidney:

Renal measurements: 9.3 x 4.3 x 4.0 cm = volume: 84 mL .
Echogenicity within normal limits. No mass or hydronephrosis
visualized.

Left Kidney:

Renal measurements: 11.0 x 5.7 x 5.5 cm = volume: 185 mL.
Echogenicity within normal limits. No mass or hydronephrosis
visualized.

Bladder:

Appears normal for degree of bladder distention.

Prostate is enlarged measuring 4.8 x 5.1 x 5.1 cm
IMPRESSION: 1. No acute findings.  No hydronephrosis.  No renal masses.
2. Right kidney smaller than left.  No other renal abnormality.
3. Enlarged prostate gland.

## 2019-04-19 DIAGNOSIS — E291 Testicular hypofunction: Secondary | ICD-10-CM | POA: Diagnosis not present

## 2019-05-20 DIAGNOSIS — E291 Testicular hypofunction: Secondary | ICD-10-CM | POA: Diagnosis not present

## 2019-06-20 DIAGNOSIS — Z23 Encounter for immunization: Secondary | ICD-10-CM | POA: Diagnosis not present

## 2019-06-20 DIAGNOSIS — E291 Testicular hypofunction: Secondary | ICD-10-CM | POA: Diagnosis not present

## 2019-07-24 DIAGNOSIS — E291 Testicular hypofunction: Secondary | ICD-10-CM | POA: Diagnosis not present

## 2019-08-12 DIAGNOSIS — H9193 Unspecified hearing loss, bilateral: Secondary | ICD-10-CM | POA: Diagnosis not present

## 2019-08-12 DIAGNOSIS — Z87891 Personal history of nicotine dependence: Secondary | ICD-10-CM | POA: Diagnosis not present

## 2019-08-12 DIAGNOSIS — N400 Enlarged prostate without lower urinary tract symptoms: Secondary | ICD-10-CM | POA: Diagnosis not present

## 2019-08-12 DIAGNOSIS — E039 Hypothyroidism, unspecified: Secondary | ICD-10-CM | POA: Diagnosis not present

## 2019-08-12 DIAGNOSIS — Z6826 Body mass index (BMI) 26.0-26.9, adult: Secondary | ICD-10-CM | POA: Diagnosis not present

## 2019-08-12 DIAGNOSIS — E785 Hyperlipidemia, unspecified: Secondary | ICD-10-CM | POA: Diagnosis not present

## 2019-08-12 DIAGNOSIS — F325 Major depressive disorder, single episode, in full remission: Secondary | ICD-10-CM | POA: Diagnosis not present

## 2019-08-12 DIAGNOSIS — I10 Essential (primary) hypertension: Secondary | ICD-10-CM | POA: Diagnosis not present

## 2019-08-23 DIAGNOSIS — E291 Testicular hypofunction: Secondary | ICD-10-CM | POA: Diagnosis not present

## 2019-09-24 DIAGNOSIS — E291 Testicular hypofunction: Secondary | ICD-10-CM | POA: Diagnosis not present

## 2019-10-28 DIAGNOSIS — E291 Testicular hypofunction: Secondary | ICD-10-CM | POA: Diagnosis not present

## 2019-11-07 ENCOUNTER — Ambulatory Visit: Payer: Medicare PPO

## 2019-11-11 ENCOUNTER — Ambulatory Visit: Payer: Medicare PPO | Attending: Family

## 2019-11-11 DIAGNOSIS — Z23 Encounter for immunization: Secondary | ICD-10-CM | POA: Insufficient documentation

## 2019-11-11 NOTE — Progress Notes (Signed)
   Covid-19 Vaccination Clinic  Name:  BRIGHTEN ORNDOFF    MRN: [id number] DOB: 06/01/1941  11/11/2019  Ms. Baus was observed post Covid-19 immunization for 15 minutes without incidence. She was provided with Vaccine Information Sheet and instruction to access the V-Safe system.   Ms. Giangregorio was instructed to call 911 with any severe reactions post vaccine: Marland Kitchen Difficulty breathing  . Swelling of your face and throat  . A fast heartbeat  . A bad rash all over your body  . Dizziness and weakness    Immunizations Administered    Name Date Dose VIS Date Route   Moderna COVID-19 Vaccine 11/11/2019  4:07 PM 0.5 mL 08/20/2019 Intramuscular   Manufacturer: Moderna   Lot: [id number]   [place]: [phone]

## 2019-11-28 DIAGNOSIS — E291 Testicular hypofunction: Secondary | ICD-10-CM | POA: Diagnosis not present

## 2019-12-24 ENCOUNTER — Ambulatory Visit: Payer: Medicare PPO | Attending: Family

## 2019-12-24 DIAGNOSIS — Z23 Encounter for immunization: Secondary | ICD-10-CM

## 2019-12-24 NOTE — Progress Notes (Signed)
   Covid-19 Vaccination Clinic  Name:  Phillip Franklin    MRN: [id number] DOB: 07/08/1941  12/24/2019  [name] was observed post Covid-19 immunization for 15 minutes without incident. He was provided with Vaccine Information Sheet and instruction to access the V-Safe system.   [name] was instructed to call 911 with any severe reactions post vaccine: Marland Kitchen Difficulty breathing  . Swelling of face and throat  . A fast heartbeat  . A bad rash all over body  . Dizziness and weakness   Immunizations Administered    Name Date Dose VIS Date Route   Moderna COVID-19 Vaccine 12/24/2019  4:05 PM 0.5 mL 08/20/2019 Intramuscular   Manufacturer: Moderna   Lot: [id number]   [place]: [phone]

## 2019-12-30 DIAGNOSIS — E291 Testicular hypofunction: Secondary | ICD-10-CM | POA: Diagnosis not present

## 2020-01-30 DIAGNOSIS — E291 Testicular hypofunction: Secondary | ICD-10-CM | POA: Diagnosis not present

## 2020-02-21 ENCOUNTER — Other Ambulatory Visit: Payer: Self-pay | Admitting: Internal Medicine

## 2020-02-21 ENCOUNTER — Ambulatory Visit
Admission: RE | Admit: 2020-02-21 | Discharge: 2020-02-21 | Disposition: A | Discharge status: Home / Self Care | Payer: Medicare PPO | Source: Ambulatory Visit | Attending: Internal Medicine | Admitting: Internal Medicine

## 2020-02-21 DIAGNOSIS — I6529 Occlusion and stenosis of unspecified carotid artery: Secondary | ICD-10-CM | POA: Diagnosis not present

## 2020-02-21 DIAGNOSIS — R634 Abnormal weight loss: Secondary | ICD-10-CM

## 2020-02-21 DIAGNOSIS — E291 Testicular hypofunction: Secondary | ICD-10-CM | POA: Diagnosis not present

## 2020-02-21 DIAGNOSIS — R059 Cough, unspecified: Secondary | ICD-10-CM

## 2020-02-21 DIAGNOSIS — R05 Cough: Secondary | ICD-10-CM | POA: Diagnosis not present

## 2020-02-21 DIAGNOSIS — Z0000 Encounter for general adult medical examination without abnormal findings: Secondary | ICD-10-CM | POA: Diagnosis not present

## 2020-02-21 DIAGNOSIS — N400 Enlarged prostate without lower urinary tract symptoms: Secondary | ICD-10-CM | POA: Diagnosis not present

## 2020-02-21 DIAGNOSIS — E7800 Pure hypercholesterolemia, unspecified: Secondary | ICD-10-CM | POA: Diagnosis not present

## 2020-02-21 DIAGNOSIS — Z1389 Encounter for screening for other disorder: Secondary | ICD-10-CM | POA: Diagnosis not present

## 2020-02-21 DIAGNOSIS — I700 Atherosclerosis of aorta: Secondary | ICD-10-CM | POA: Diagnosis not present

## 2020-02-21 DIAGNOSIS — I10 Essential (primary) hypertension: Secondary | ICD-10-CM | POA: Diagnosis not present

## 2020-02-21 IMAGING — DX DG CHEST 2V
2 series · 2 of 2 positions shown · non-contrast
Comparison: [DATE]

CLINICAL DATA: Weight loss for 6 months, cough, hypertension,
former smoker

EXAM:
CHEST - 2 VIEW

[dg chest 2 view (1 of 2)]
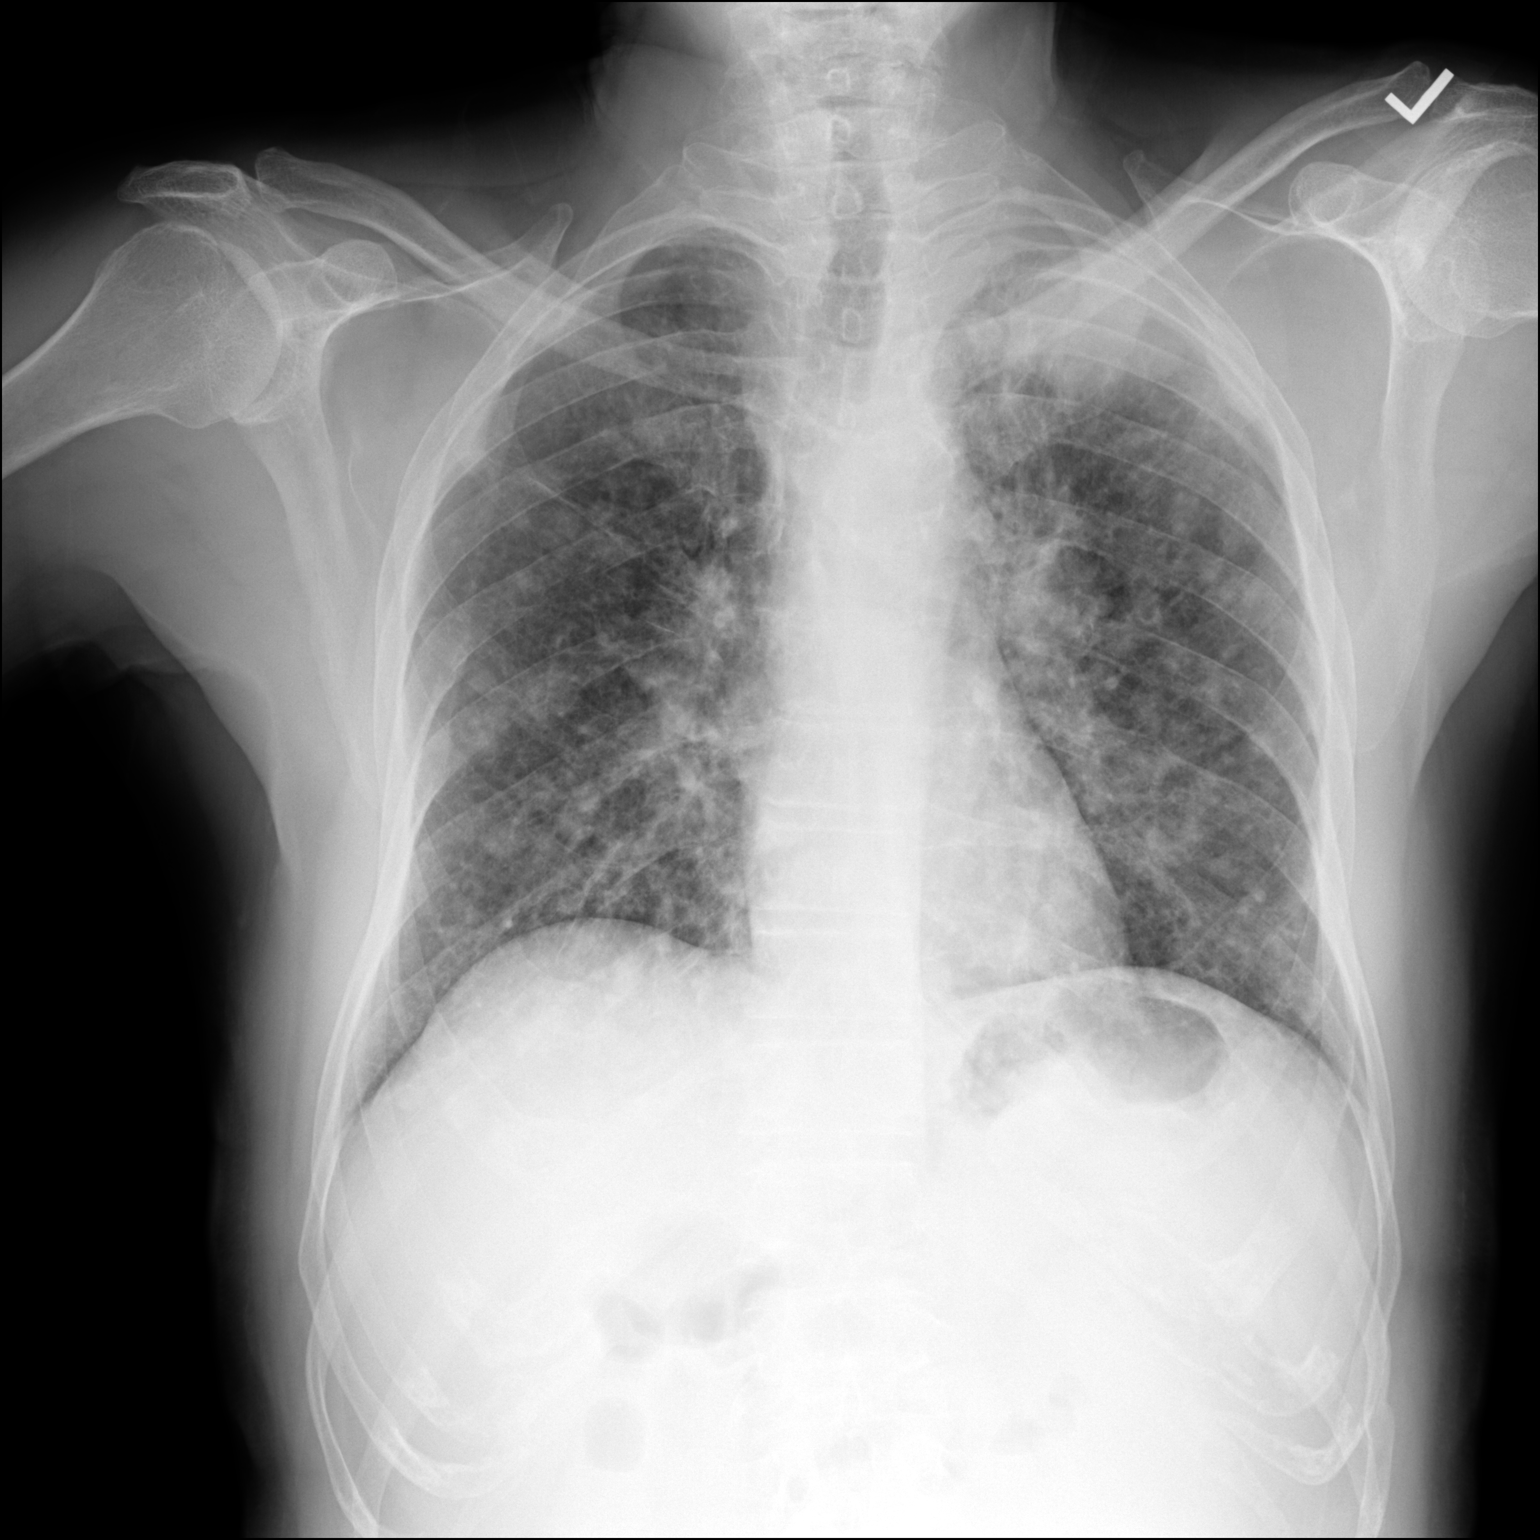

[dg chest 2 view (2 of 2)]
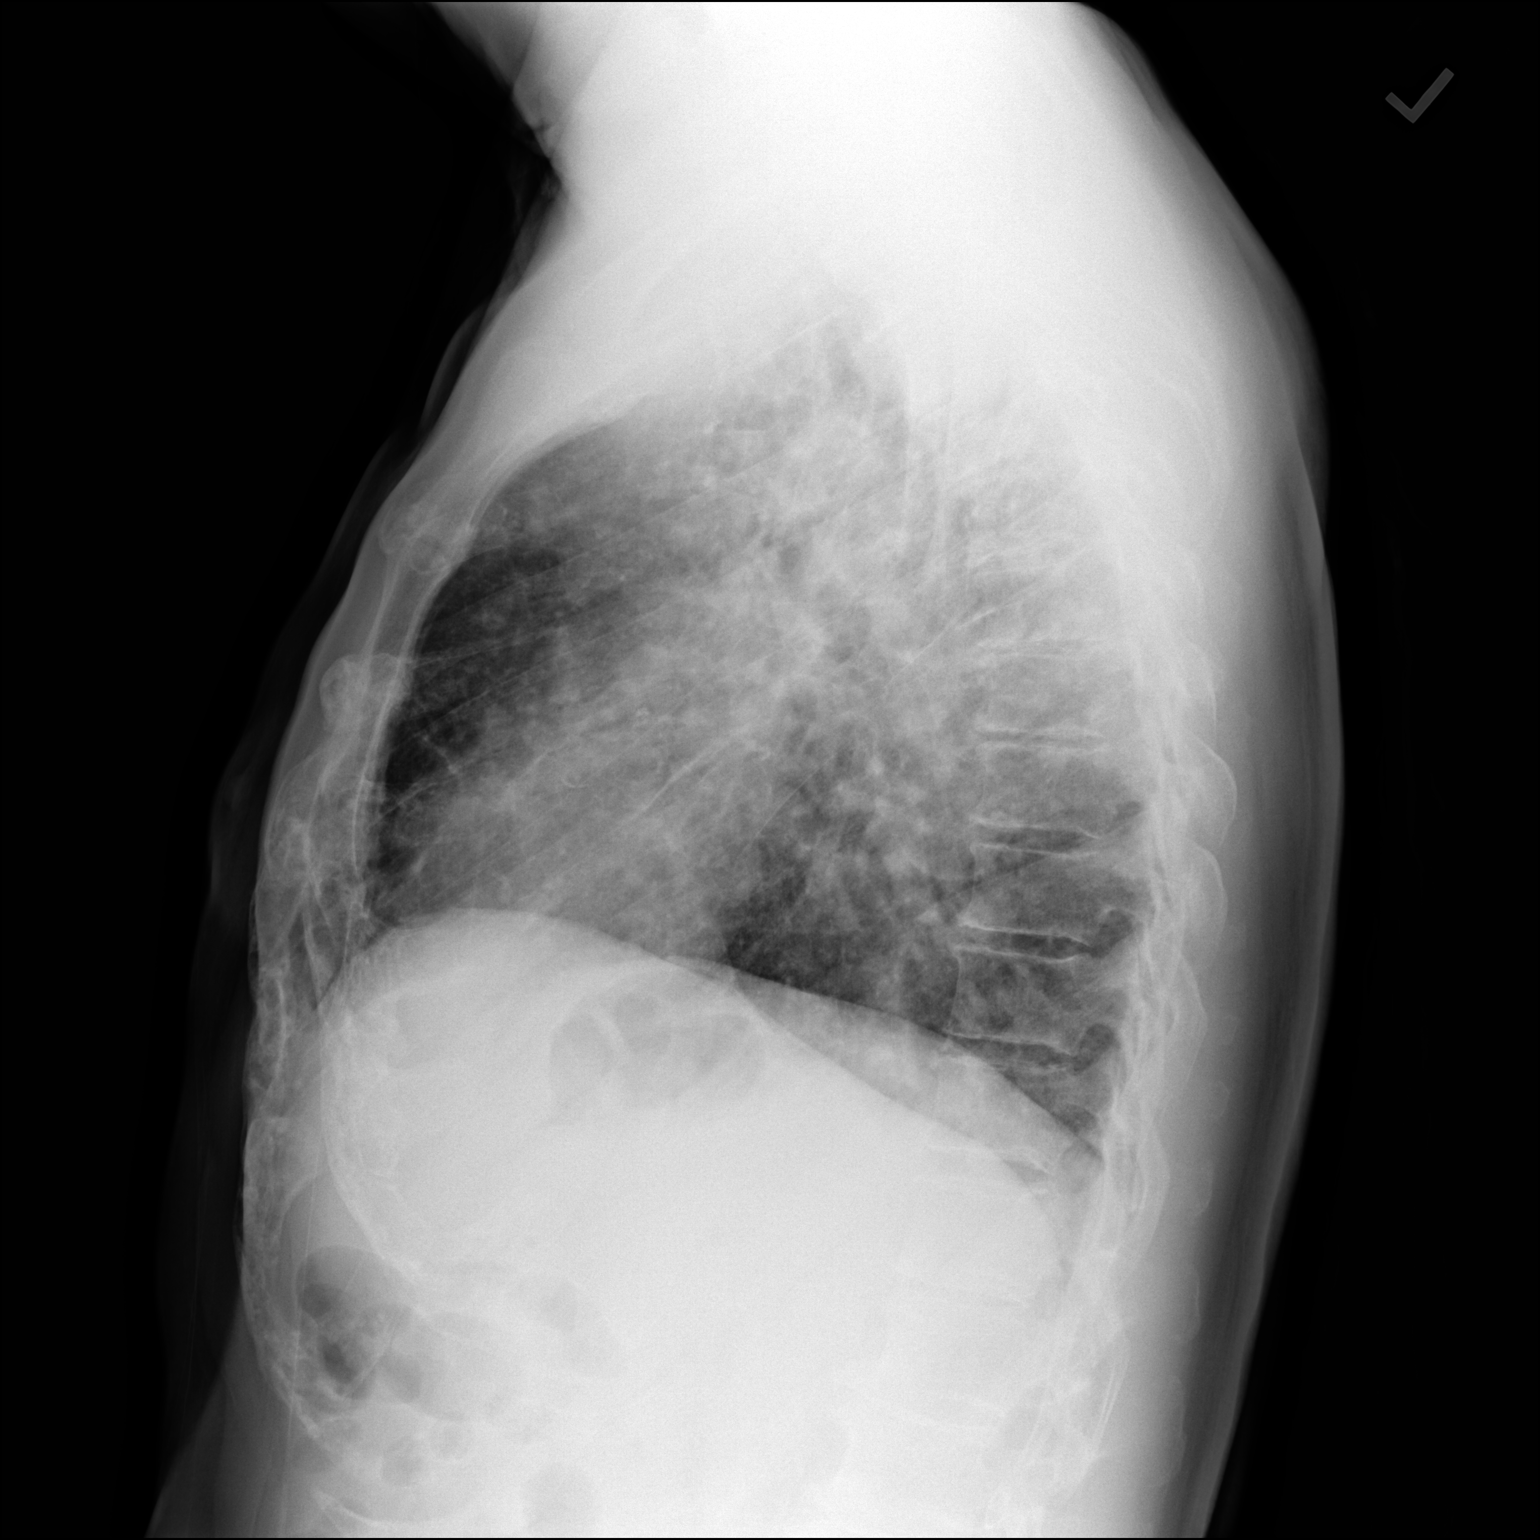

[2 of 2 positions shown; findings below may reference images not displayed]

FINDINGS: Normal heart size and pulmonary vascularity.

Atherosclerotic calcification aorta.

Abnormal enlargement of hila greater on LEFT.

New opacity at LEFT apex suspicious for neoplasm, 5.3 x 3.6 cm.

In addition, multiple small BILATERAL pulmonary nodules highly
suspicious for metastases.

Chronic peribronchial thickening and chronic interstitial changes
throughout both lungs are again identified.

No pleural effusion or pneumothorax.

No osseous lesions.
IMPRESSION: LEFT apex mass 5.3 x 3.6 cm suspicious for pulmonary neoplasm.

Additionally, BILATERAL hilar enlargement question adenopathy and
numerous small nodules throughout both lungs highly suspicious for
metastatic disease.

Underlying chronic bronchitic and interstitial lung disease changes.

Further evaluation by CT chest with contrast recommended.

These results will be called to the ordering clinician or
representative by the Radiologist Assistant, and communication
documented in the PACS or [REDACTED].

## 2020-02-24 ENCOUNTER — Other Ambulatory Visit: Payer: Self-pay | Admitting: Internal Medicine

## 2020-02-24 DIAGNOSIS — R9389 Abnormal findings on diagnostic imaging of other specified body structures: Secondary | ICD-10-CM

## 2020-03-02 DIAGNOSIS — E291 Testicular hypofunction: Secondary | ICD-10-CM | POA: Diagnosis not present

## 2020-03-05 ENCOUNTER — Ambulatory Visit
Admission: RE | Admit: 2020-03-05 | Discharge: 2020-03-05 | Disposition: A | Discharge status: Home / Self Care | Payer: Medicare PPO | Source: Ambulatory Visit | Attending: Internal Medicine | Admitting: Internal Medicine

## 2020-03-05 DIAGNOSIS — R9389 Abnormal findings on diagnostic imaging of other specified body structures: Secondary | ICD-10-CM

## 2020-03-05 DIAGNOSIS — C7800 Secondary malignant neoplasm of unspecified lung: Secondary | ICD-10-CM | POA: Diagnosis not present

## 2020-03-05 DIAGNOSIS — R918 Other nonspecific abnormal finding of lung field: Secondary | ICD-10-CM | POA: Diagnosis not present

## 2020-03-05 IMAGING — CT CT CHEST W/ CM
2 of 4 series · 11 of 36 positions shown, 13 images · IV contrast (iopamidol)
Comparison: CT of the chest [DATE].

CLINICAL DATA: 79-year-old male with 20 pounds of weight loss over
the past 2-3 months. Cough. Former smoker (quit 42 years ago).
Abnormal chest x-ray.

EXAM:
CT CHEST WITH CONTRAST
TECHNIQUE: Multidetector CT imaging of the chest was performed during
intravenous contrast administration.
CONTRAST:  75mL [85] IOPAMIDOL ([85]) INJECTION 61%

[Series 2: chest 2.00 br40 s3 · axial · 0.56mm/px · z∈[+1578,+1820]mm · 8 of 145 slices shown, 10 images (1 of 2)]
[im 12/145  mediastinal]
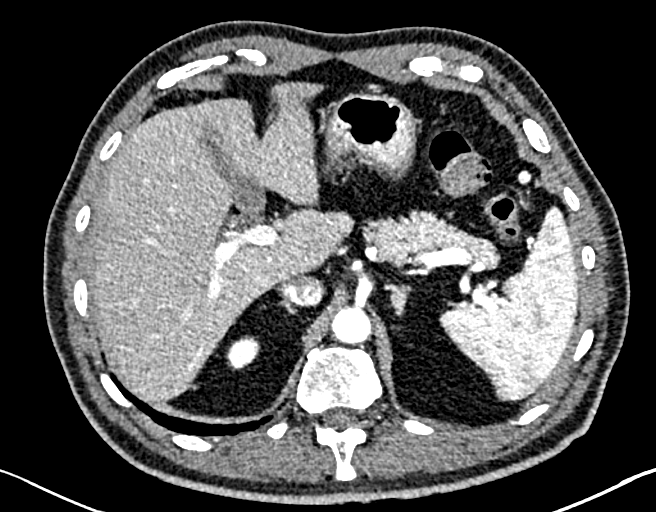
[im 12/145  lung]
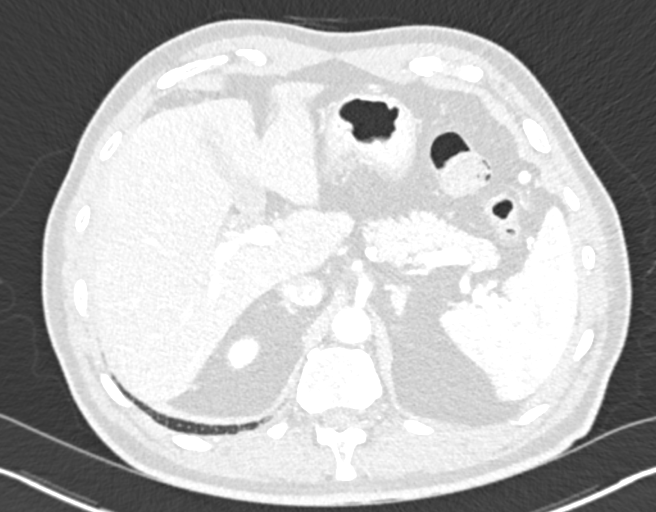
[im 34/145  lung]
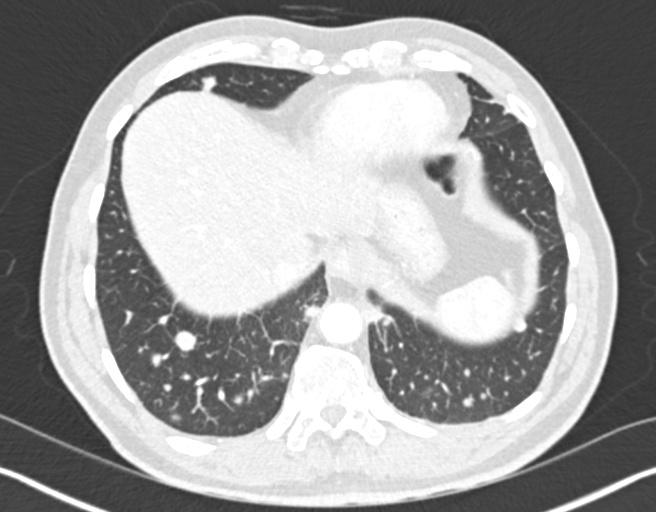
[im 45/145  lung]
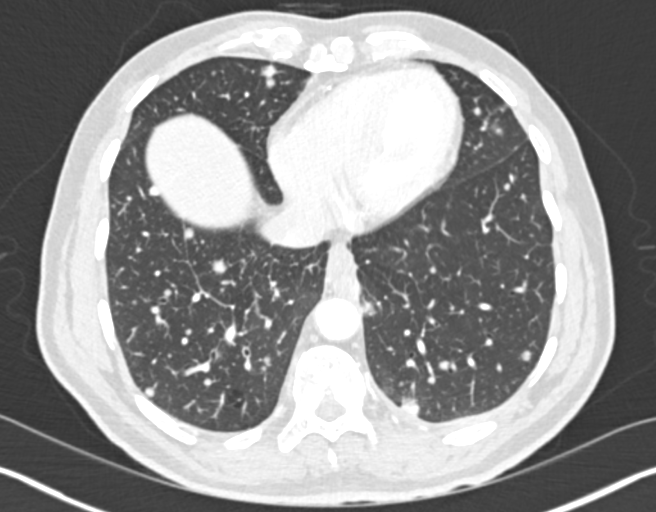
[im 67/145  lung]
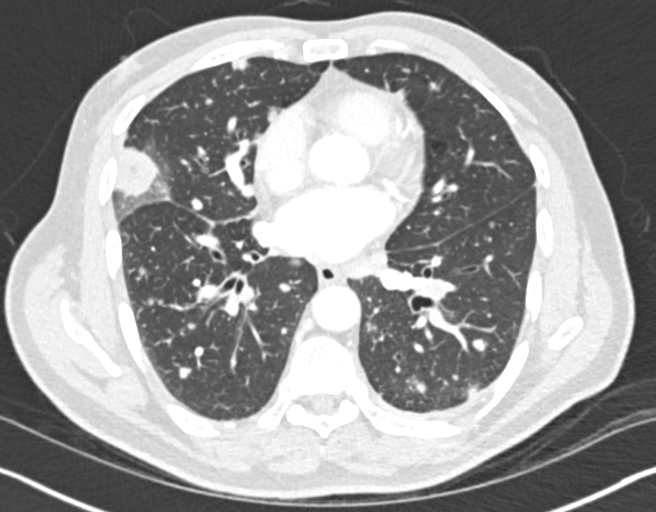
[im 78/145  mediastinal]
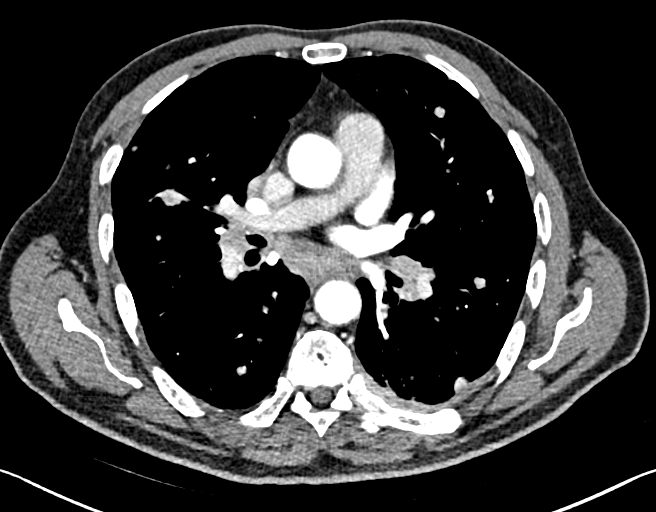
[im 78/145  lung]
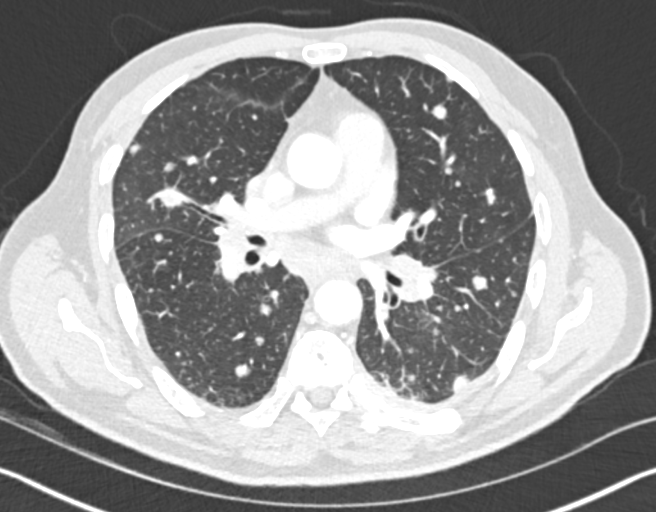
[im 100/145  lung]
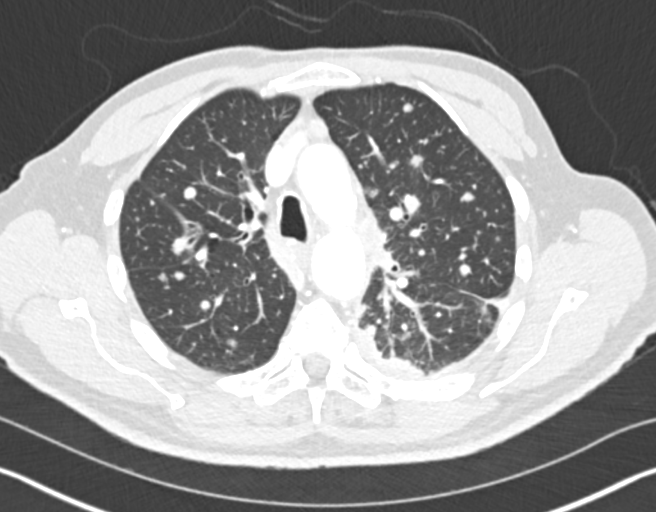
[im 111/145  lung]
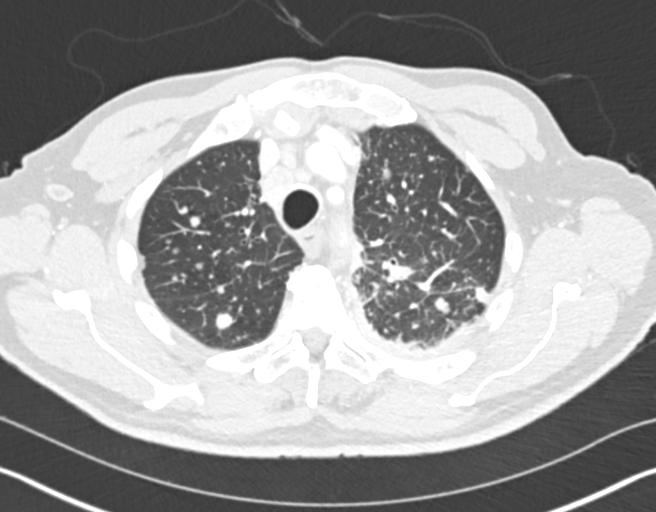
[im 133/145  lung]
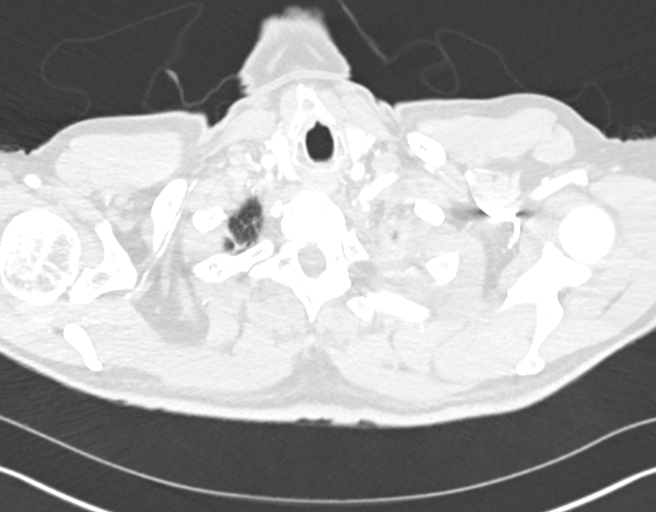

[Series 4: chest 2.00 br40 s3 · coronal · 0.57mm/px · 3 of 142 slices shown (2 of 2)]
[im 29/142  lung]
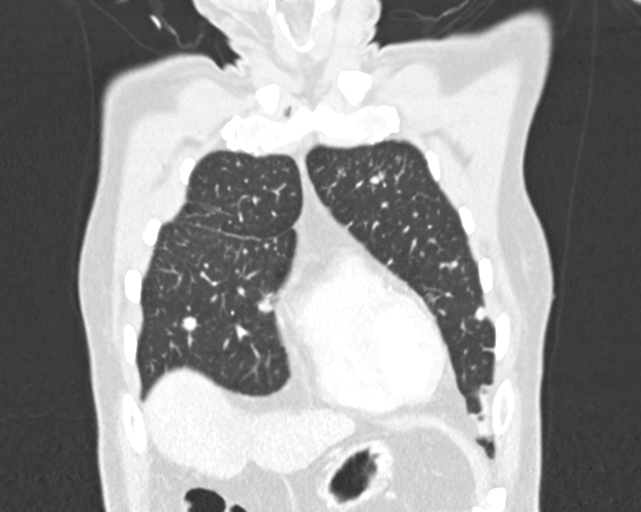
[im 57/142  lung]
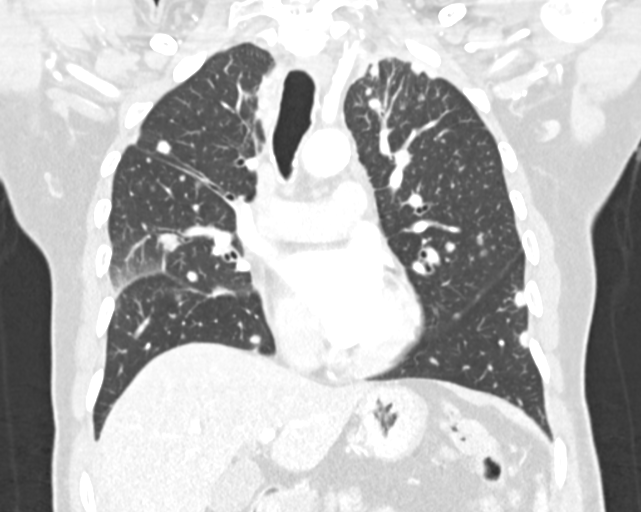
[im 85/142  lung]
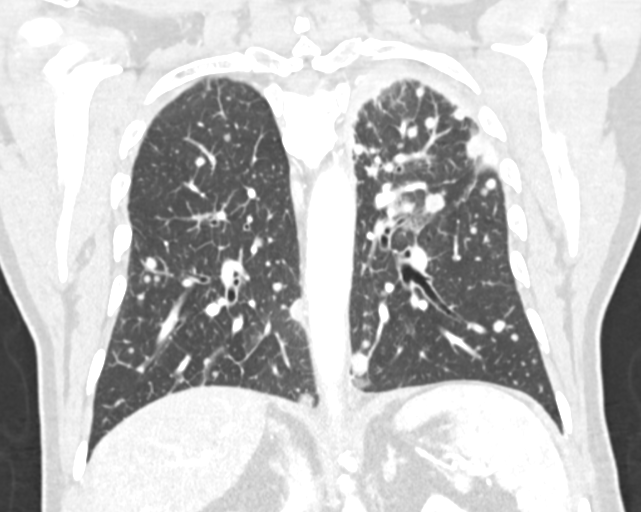

[11 of 36 positions shown; findings below may reference images not displayed]

FINDINGS: Cardiovascular: Heart size is normal. There is no significant
pericardial fluid, thickening or pericardial calcification. There is
aortic atherosclerosis, as well as atherosclerosis of the great
vessels of the mediastinum and the coronary arteries, including
calcified atherosclerotic plaque in the left main, left anterior
descending and left circumflex coronary arteries. Severe thickening
calcification of the aortic valve. Calcifications of the inferior
aspect of the mitral annulus.

Mediastinum/Nodes: Numerous borderline enlarged and enlarged
mediastinal and bilateral hilar lymph nodes, largest of which
include a 2.3 cm short axis subcarinal nodal mass, and a 1.4 cm
short axis left hilar lymph node. Esophagus is unremarkable in
appearance. No axillary lymphadenopathy.

Lungs/Pleura: Large mass in the apex of the left upper lobe (axial
image 21 of series 2 and sagittal image 114 of series 6) measuring
5.5 x 4.9 x 3.5 cm. This mass makes broad contact with the pleura in
the apex of the left hemithorax. Extensive pleural thickening and
nodularity in the upper left hemithorax indicative of widespread
pleural metastatic disease. Innumerable smaller pulmonary nodules
are noted throughout the lungs bilaterally, largest of which is in
the periphery of the lateral segment of the right middle lobe (axial
image 79 of series 8) measuring 2.3 x 2.4 cm. This largest lesion in
the right middle lobe is surrounded by a halo of some ground-glass
attenuation, which could indicate some perilesional hemorrhage. No
right pleural effusion.

Upper Abdomen: 7 mm fatty attenuation lesion in the medial limb of
the right adrenal gland, compatible with a tiny right adrenal
myelolipoma.

Musculoskeletal: There are no aggressive appearing lytic or blastic
lesions noted in the visualized portions of the skeleton.
IMPRESSION: 1. Large mass in the apex of the left upper with evidence of
widespread metastatic disease throughout the lungs bilaterally, as
well as pleural metastases in the left hemithorax, and nodal disease
in mediastinal and hilar regions bilaterally. Findings likely
reflect a primary left upper lobe bronchogenic carcinoma with
metastatic disease (stage IV disease), as detailed above.
2. Aortic atherosclerosis, in addition to left main and 2 vessel
coronary artery disease.
3. There are calcifications of the aortic valve and mitral annulus.
Echocardiographic correlation for evaluation of potential valvular
dysfunction may be warranted if clinically indicated.
4. Tiny right adrenal myelolipoma incidentally noted.

Aortic Atherosclerosis ([85]-[85]).

## 2020-03-05 MED ORDER — IOPAMIDOL (ISOVUE-300) INJECTION 61%
75.0000 mL | Freq: Once | INTRAVENOUS | Status: AC | PRN
  Administered 2020-03-05: 75 mL via INTRAVENOUS

## 2020-03-10 DIAGNOSIS — R9389 Abnormal findings on diagnostic imaging of other specified body structures: Secondary | ICD-10-CM | POA: Diagnosis not present

## 2020-03-10 DIAGNOSIS — I10 Essential (primary) hypertension: Secondary | ICD-10-CM | POA: Diagnosis not present

## 2020-03-10 DIAGNOSIS — R918 Other nonspecific abnormal finding of lung field: Secondary | ICD-10-CM | POA: Diagnosis not present

## 2020-03-11 ENCOUNTER — Other Ambulatory Visit: Payer: Medicare PPO

## 2020-03-11 ENCOUNTER — Encounter: Payer: Self-pay | Admitting: *Deleted

## 2020-03-11 DIAGNOSIS — R918 Other nonspecific abnormal finding of lung field: Secondary | ICD-10-CM

## 2020-03-11 NOTE — Progress Notes (Signed)
I received referral on Phillip Franklin today.  I updated Dr. Julien Nordmann and he will see patient on 03/13/20 with labs. I notified new patient coordinator to call and schedule patient.

## 2020-03-12 ENCOUNTER — Telehealth: Payer: Self-pay | Admitting: Internal Medicine

## 2020-03-12 NOTE — Telephone Encounter (Signed)
Received a new pt referral from Dr. Delfina Redwood for metastatic lung cancer. Phillip Franklin has been scheduled to see Dr. Julien Nordmann on 6/25 at 9am w/labs at 830am. I cld and provided the appt date and time to the pt's wife. Aware to arrive 15 minutes early.

## 2020-03-13 ENCOUNTER — Inpatient Hospital Stay: Payer: Medicare PPO

## 2020-03-13 ENCOUNTER — Inpatient Hospital Stay: Payer: Medicare PPO | Attending: Internal Medicine | Admitting: Internal Medicine

## 2020-03-13 ENCOUNTER — Encounter: Payer: Self-pay | Admitting: Internal Medicine

## 2020-03-13 ENCOUNTER — Other Ambulatory Visit: Payer: Self-pay

## 2020-03-13 DIAGNOSIS — Z87891 Personal history of nicotine dependence: Secondary | ICD-10-CM | POA: Insufficient documentation

## 2020-03-13 DIAGNOSIS — F32A Depression, unspecified: Secondary | ICD-10-CM | POA: Insufficient documentation

## 2020-03-13 DIAGNOSIS — R630 Anorexia: Secondary | ICD-10-CM | POA: Insufficient documentation

## 2020-03-13 DIAGNOSIS — E039 Hypothyroidism, unspecified: Secondary | ICD-10-CM | POA: Diagnosis not present

## 2020-03-13 DIAGNOSIS — E785 Hyperlipidemia, unspecified: Secondary | ICD-10-CM | POA: Diagnosis not present

## 2020-03-13 DIAGNOSIS — R918 Other nonspecific abnormal finding of lung field: Secondary | ICD-10-CM | POA: Diagnosis not present

## 2020-03-13 DIAGNOSIS — C3490 Malignant neoplasm of unspecified part of unspecified bronchus or lung: Secondary | ICD-10-CM | POA: Insufficient documentation

## 2020-03-13 DIAGNOSIS — C7801 Secondary malignant neoplasm of right lung: Secondary | ICD-10-CM | POA: Insufficient documentation

## 2020-03-13 DIAGNOSIS — R634 Abnormal weight loss: Secondary | ICD-10-CM | POA: Insufficient documentation

## 2020-03-13 DIAGNOSIS — R590 Localized enlarged lymph nodes: Secondary | ICD-10-CM | POA: Insufficient documentation

## 2020-03-13 DIAGNOSIS — C7802 Secondary malignant neoplasm of left lung: Secondary | ICD-10-CM | POA: Diagnosis not present

## 2020-03-13 DIAGNOSIS — I10 Essential (primary) hypertension: Secondary | ICD-10-CM | POA: Diagnosis not present

## 2020-03-13 DIAGNOSIS — F329 Major depressive disorder, single episode, unspecified: Secondary | ICD-10-CM | POA: Insufficient documentation

## 2020-03-13 DIAGNOSIS — C782 Secondary malignant neoplasm of pleura: Secondary | ICD-10-CM | POA: Insufficient documentation

## 2020-03-13 LAB — CMP (CANCER CENTER ONLY)
ALT: 26 U/L (ref 0–44)
AST: 23 U/L (ref 15–41)
Albumin: 3.5 g/dL (ref 3.5–5.0)
Alkaline Phosphatase: 124 U/L (ref 38–126)
Anion gap: 9 (ref 5–15)
BUN: 14 mg/dL (ref 8–23)
CO2: 28 mmol/L (ref 22–32)
Calcium: 10.1 mg/dL (ref 8.9–10.3)
Chloride: 98 mmol/L (ref 98–111)
Creatinine: 1.5 mg/dL — ABNORMAL HIGH (ref 0.61–1.24)
GFR, Est AFR Am: 50 mL/min — ABNORMAL LOW (ref >60)
GFR, Estimated: 43 mL/min — ABNORMAL LOW (ref >60)
Glucose, Bld: 100 mg/dL — ABNORMAL HIGH (ref 70–99)
Potassium: 4.3 mmol/L (ref 3.5–5.1)
Sodium: 135 mmol/L (ref 135–145)
Total Bilirubin: 0.5 mg/dL (ref 0.3–1.2)
Total Protein: 7.7 g/dL (ref 6.5–8.1)

## 2020-03-13 LAB — CBC WITH DIFFERENTIAL (CANCER CENTER ONLY)
Abs Immature Granulocytes: 0.05 10*3/uL (ref 0.00–0.07)
Basophils Absolute: 0 10*3/uL (ref 0.0–0.1)
Basophils Relative: 0 %
Eosinophils Absolute: 0.2 10*3/uL (ref 0.0–0.5)
Eosinophils Relative: 1 %
HCT: 44.1 % (ref 39.0–52.0)
Hemoglobin: 14 g/dL (ref 13.0–17.0)
Immature Granulocytes: 1 %
Lymphocytes Relative: 18 %
Lymphs Abs: 2 10*3/uL (ref 0.7–4.0)
MCH: 28.7 pg (ref 26.0–34.0)
MCHC: 31.7 g/dL (ref 30.0–36.0)
MCV: 90.4 fL (ref 80.0–100.0)
Monocytes Absolute: 1 10*3/uL (ref 0.1–1.0)
Monocytes Relative: 9 %
Neutro Abs: 7.9 10*3/uL — ABNORMAL HIGH (ref 1.7–7.7)
Neutrophils Relative %: 71 %
Platelet Count: 288 10*3/uL (ref 150–400)
RBC: 4.88 MIL/uL (ref 4.22–5.81)
RDW: 14.1 % (ref 11.5–15.5)
WBC Count: 11.1 10*3/uL — ABNORMAL HIGH (ref 4.0–10.5)
nRBC: 0 % (ref 0.0–0.2)

## 2020-03-13 MED ORDER — METHYLPREDNISOLONE 4 MG PO TBPK
ORAL_TABLET | ORAL | 0 refills | Status: DC
Start: 2020-03-13 — End: 2020-03-24

## 2020-03-13 MED ORDER — MIRTAZAPINE 30 MG PO TABS
30.0000 mg | ORAL_TABLET | Freq: Every day | ORAL | 2 refills | Status: DC
Start: 2020-03-13 — End: 2020-05-11

## 2020-03-13 NOTE — Progress Notes (Signed)
[place] [phone]   [phone]  CONSULT NOTE  REFERRING PHYSICIAN: Dr. Seward Carol  REASON FOR CONSULTATION:  79 years old white male with highly suspicious metastatic lung cancer  HPI Phillip Franklin is a 79 y.o. male with past medical history significant for hypertension, hypothyroidism, dyslipidemia as well as depression.  The patient also has remote history of smoking but quit 42 years ago.  He presented to his primary care physician Dr. Edythe Franklin complaining of weight loss over 20 pounds in the last 3 months with persistent cough.  Chest x-ray was performed on 02/21/2020 and it showed left apex mass measuring 5.3 x 3.6 cm suspicious for pulmonary neoplasm.  There was also bilateral hilar enlargement questionable for adenopathy and numerous small nodules throughout both lungs highly suspicious for metastatic disease.  This was followed by CT scan of the chest with contrast on 03/05/2020 and it showed large mass in the apex of the left upper lobe measuring 5.5 x 4.9 x 3.5 cm.  This mass makes broad contact with the pleura in the apex of the left hemithorax.  There was also extensive pleural thickening and nodularity in the upper left hemithorax indicative of widespread pleural metastatic disease.  There was also innumerable smaller pulmonary nodules noted throughout the lungs bilaterally the largest of which is in the periphery of the lateral segment of the right middle lobe measuring 2.3 x 2.4 cm.  The scan also showed numerous borderline enlargement mediastinal and bilateral hilar lymph nodes the largest of which include 2.3 cm short axis subcarinal nodal mass and 1.4 cm short axis left hilar lymph node. Dr. Delfina Franklin kindly referred the patient to me today for evaluation and recommendation regarding treatment of his condition. When seen today the patient is feeling fine except for lack of appetite and energy.  [name] sleeping most of the time he also has lack  of interest in doing things and more depressed.  He has mild cough but no significant chest pain shortness of breath or hemoptysis.  He lost around 20 pounds in the last 3 months.  He denied having any headache or visual changes. Family history significant for father with heart disease.  Mother died from old age. The patient is married and has 2 children a son and daughter.  His daughter, Phillip Franklin is a primary care physician in [place] and his son, Phillip Franklin lives in [place].  He was accompanied by his wife Phillip Franklin.  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  He has a history of smoking up to 1.5 pack/day for around 20 years but quit 42 years ago.  He has no history of alcohol or drug abuse.  HPI  Past Medical History:  Diagnosis Date  . Depression   . High cholesterol   . Hypertension   . Hypothyroid     No past surgical history on file.  Family History  Problem Relation Age of Onset  . Heart disease Father     Social History Social History   Tobacco Use  . Smoking status: Former Smoker    Types: Cigars    Quit date: 11/13/1977    Years since quitting: 42.3  . Smokeless tobacco: Never Used  Vaping Use  . Vaping Use: Never used  Substance Use Topics  . Alcohol use: No  . Drug use: No    Allergies  Allergen Reactions  . Codeine Other (See Comments)  hallucinations  . Hydrocodone Other (See Comments)    hallucinations  . Oxycodone Other (See Comments)    hallucinations    Current Outpatient Medications  Medication Sig Dispense Refill  . aspirin 81 MG tablet Take 81 mg by mouth daily.    Marland Kitchen atorvastatin (LIPITOR) 10 MG tablet Take 5 mg by mouth daily.     Marland Kitchen BIOTIN PO Take 1 tablet by mouth daily.    . Cholecalciferol (VITAMIN D PO) Take 1 tablet by mouth daily.    . Coenzyme Q10 (COQ10 PO) Take 1 tablet by mouth daily.    Marland Kitchen doxycycline (VIBRAMYCIN) 100 MG capsule Take 1 capsule (100 mg total) by mouth 2 (two) times daily. 14  capsule 0  . HYDROcodone-acetaminophen (NORCO) 5-325 MG tablet Take 1-2 tablets by mouth every 6 (six) hours as needed for moderate pain. 12 tablet 0  . levothyroxine (SYNTHROID, LEVOTHROID) 75 MCG tablet Take 75 mcg by mouth daily before breakfast.    . polyethylene glycol (MIRALAX) packet Take 17 g by mouth daily. 14 each 0   No current facility-administered medications for this visit.    Review of Systems  Constitutional: positive for anorexia, fatigue and weight loss Eyes: negative Ears, nose, mouth, throat, and face: negative Respiratory: positive for cough Cardiovascular: negative Gastrointestinal: negative Genitourinary:negative Integument/breast: negative Hematologic/lymphatic: negative Musculoskeletal:negative Neurological: negative Behavioral/Psych: negative Endocrine: negative Allergic/Immunologic: negative  Physical Exam  IOX:BDZHG, healthy, no distress, well nourished, well developed and anxious SKIN: skin color, texture, turgor are normal, no rashes or significant lesions HEAD: Normocephalic, No masses, lesions, tenderness or abnormalities EYES: normal, PERRLA, Conjunctiva are pink and non-injected EARS: External ears normal, Canals clear OROPHARYNX:no exudate, no erythema and lips, buccal mucosa, and tongue normal  NECK: supple, no adenopathy, no JVD LYMPH:  no palpable lymphadenopathy, no hepatosplenomegaly LUNGS: clear to auscultation , and palpation HEART: regular rate & rhythm, no murmurs and no gallops ABDOMEN:abdomen soft, non-tender, normal bowel sounds and no masses or organomegaly BACK: No CVA tenderness, Range of motion is normal EXTREMITIES:no joint deformities, effusion, or inflammation, no edema  NEURO: alert & oriented x 3 with fluent speech, no focal motor/sensory deficits  PERFORMANCE STATUS: ECOG 1  LABORATORY DATA: Lab Results  Component Value Date   WBC 11.1 (H) 03/13/2020   HGB 14.0 03/13/2020   HCT 44.1 03/13/2020   MCV 90.4  03/13/2020   PLT 288 03/13/2020      Chemistry      Component Value Date/Time   NA 135 11/13/2016 1237   K 3.3 (L) 11/13/2016 1237   CL 101 11/13/2016 1237   CO2 26 11/13/2016 1237   BUN 12 11/13/2016 1237   CREATININE 1.51 (H) 11/13/2016 1237      Component Value Date/Time   CALCIUM 9.6 11/13/2016 1237       RADIOGRAPHIC STUDIES: DG Chest 2 View  Result Date: 02/22/2020 CLINICAL DATA:  Weight loss for 6 months, cough, hypertension, former smoker EXAM: CHEST - 2 VIEW COMPARISON:  11/13/2016 FINDINGS: Normal heart size and pulmonary vascularity. Atherosclerotic calcification aorta. Abnormal enlargement of hila greater on LEFT. New opacity at LEFT apex suspicious for neoplasm, 5.3 x 3.6 cm. In addition, multiple small BILATERAL pulmonary nodules highly suspicious for metastases. Chronic peribronchial thickening and chronic interstitial changes throughout both lungs are again identified. No pleural effusion or pneumothorax. No osseous lesions. IMPRESSION: LEFT apex mass 5.3 x 3.6 cm suspicious for pulmonary neoplasm. Additionally, BILATERAL hilar enlargement question adenopathy and numerous small nodules throughout both lungs highly suspicious  for metastatic disease. Underlying chronic bronchitic and interstitial lung disease changes. Further evaluation by CT chest with contrast recommended. These results will be called to the ordering clinician or representative by the Radiologist Assistant, and communication documented in the PACS or [employer]. Electronically Signed   By: Lavonia Dana M.D.   On: 02/22/2020 20:33   CT CHEST W CONTRAST  Result Date: 03/06/2020 CLINICAL DATA:  79 year old male with 20 pounds of weight loss over the past 2-3 months. Cough. Former smoker (quit 42 years ago). Abnormal chest x-ray. EXAM: CT CHEST WITH CONTRAST TECHNIQUE: Multidetector CT imaging of the chest was performed during intravenous contrast administration. CONTRAST:  29m ISOVUE-300 IOPAMIDOL  (ISOVUE-300) INJECTION 61% COMPARISON:  CT of the chest 08/28/2011. FINDINGS: Cardiovascular: Heart size is normal. There is no significant pericardial fluid, thickening or pericardial calcification. There is aortic atherosclerosis, as well as atherosclerosis of the great vessels of the mediastinum and the coronary arteries, including calcified atherosclerotic plaque in the left main, left anterior descending and left circumflex coronary arteries. Severe thickening calcification of the aortic valve. Calcifications of the inferior aspect of the mitral annulus. Mediastinum/Nodes: Numerous borderline enlarged and enlarged mediastinal and bilateral hilar lymph nodes, largest of which include a 2.3 cm short axis subcarinal nodal mass, and a 1.4 cm short axis left hilar lymph node. Esophagus is unremarkable in appearance. No axillary lymphadenopathy. Lungs/Pleura: Large mass in the apex of the left upper lobe (axial image 21 of series 2 and sagittal image 114 of series 6) measuring 5.5 x 4.9 x 3.5 cm. This mass makes broad contact with the pleura in the apex of the left hemithorax. Extensive pleural thickening and nodularity in the upper left hemithorax indicative of widespread pleural metastatic disease. Innumerable smaller pulmonary nodules are noted throughout the lungs bilaterally, largest of which is in the periphery of the lateral segment of the right middle lobe (axial image 79 of series 8) measuring 2.3 x 2.4 cm. This largest lesion in the right middle lobe is surrounded by a halo of some ground-glass attenuation, which could indicate some perilesional hemorrhage. No right pleural effusion. Upper Abdomen: 7 mm fatty attenuation lesion in the medial limb of the right adrenal gland, compatible with a tiny right adrenal myelolipoma. Musculoskeletal: There are no aggressive appearing lytic or blastic lesions noted in the visualized portions of the skeleton. IMPRESSION: 1. Large mass in the apex of the left upper with  evidence of widespread metastatic disease throughout the lungs bilaterally, as well as pleural metastases in the left hemithorax, and nodal disease in mediastinal and hilar regions bilaterally. Findings likely reflect a primary left upper lobe bronchogenic carcinoma with metastatic disease (stage IV disease), as detailed above. 2. Aortic atherosclerosis, in addition to left main and 2 vessel coronary artery disease. 3. There are calcifications of the aortic valve and mitral annulus. Echocardiographic correlation for evaluation of potential valvular dysfunction may be warranted if clinically indicated. 4. Tiny right adrenal myelolipoma incidentally noted. Aortic Atherosclerosis (ICD10-I70.0). Electronically Signed   By: DVinnie LangtonM.D.   On: 03/06/2020 09:52    ASSESSMENT: This is a very pleasant 79years old white male with likely stage IV (T3, N2, M1 a) lung cancer probably non-small cell adenocarcinoma presented with large left upper lobe lung mass in addition to mediastinal lymphadenopathy and innumerable bilateral pulmonary nodules.  PLAN: I had a lengthy discussion with the patient and his wife today about his current disease status and further investigation to confirm his diagnosis as well as  potential treatment options. I personally and independently reviewed the scan images and discussed the results and showed the images to the patient and his wife. I recommended for the patient to have a PET scan as well as MRI of the brain to complete the staging work-up. I will also refer the patient to Dr. Valeta Harms with pulmonary medicine for consideration of bronchoscopy with endobronchial ultrasound and biopsy for tissue diagnosis. If the tissue diagnosis is consistent with non-small cell carcinoma, we will send the tissue block for molecular studies and PD-L1 by foundation 1. For the lack of appetite and depression, I will change his antidepressant to Remeron 30 mg p.o. nightly. For the weight loss, I  will start the patient on Medrol Dosepak. He will come back for follow-up visit in around 2 weeks for more detailed discussion of his treatment options based on the final pathology and staging work-up. The patient was advised to call immediately if he has any concerning symptoms in the interval. The patient voices understanding of current disease status and treatment options and is in agreement with the current care plan.  All questions were answered. The patient knows to call the clinic with any problems, questions or concerns. We can certainly see the patient much sooner if necessary.  Thank you so much for allowing me to participate in the care of Phillip Franklin. I will continue to follow up the patient with you and assist in his care.  The total time spent in the appointment was 90 minutes.  Disclaimer: This note was dictated with voice recognition software. Similar sounding words can inadvertently be transcribed and may not be corrected upon review.   Eilleen Kempf March 13, 2020, 8:30 AM

## 2020-03-16 ENCOUNTER — Ambulatory Visit: Payer: Medicare PPO | Admitting: Primary Care

## 2020-03-18 ENCOUNTER — Telehealth: Payer: Self-pay | Admitting: Internal Medicine

## 2020-03-18 NOTE — Telephone Encounter (Signed)
Scheduled per los. Called and spoke with wife. Confirmed appt

## 2020-03-19 ENCOUNTER — Encounter: Payer: Self-pay | Admitting: *Deleted

## 2020-03-19 ENCOUNTER — Telehealth: Payer: Self-pay | Admitting: Pulmonary Disease

## 2020-03-19 DIAGNOSIS — R918 Other nonspecific abnormal finding of lung field: Secondary | ICD-10-CM

## 2020-03-19 NOTE — Progress Notes (Signed)
I followed up on Mr. Sudduth schedule and did not see him having an appt with pulmonary yet. I reached out to Dr. Valeta Harms about patient.

## 2020-03-19 NOTE — Telephone Encounter (Signed)
PCCM:  Orders placed for EBUS bronchoscopy. Patient to have consultation with Dr. Tamala Julian or myself day of procedure in pre-op if patient willing to speed up access to bx.   Can be schedule July 5th, 6th or 7th in the afternoon if possible.   [facility] 03/19/2020 5:13 PM

## 2020-03-20 ENCOUNTER — Encounter (HOSPITAL_COMMUNITY): Payer: Self-pay | Admitting: Pulmonary Disease

## 2020-03-20 ENCOUNTER — Ambulatory Visit (HOSPITAL_COMMUNITY)
Admission: RE | Admit: 2020-03-20 | Discharge: 2020-03-20 | Disposition: A | Discharge status: Home / Self Care | Payer: Medicare PPO | Source: Ambulatory Visit | Attending: Internal Medicine | Admitting: Internal Medicine

## 2020-03-20 ENCOUNTER — Other Ambulatory Visit: Payer: Self-pay

## 2020-03-20 ENCOUNTER — Other Ambulatory Visit (HOSPITAL_COMMUNITY)
Admission: RE | Admit: 2020-03-20 | Discharge: 2020-03-20 | Disposition: A | Discharge status: Home / Self Care | Payer: Medicare PPO | Source: Ambulatory Visit | Attending: Pulmonary Disease | Admitting: Pulmonary Disease

## 2020-03-20 ENCOUNTER — Telehealth: Payer: Self-pay | Admitting: Pulmonary Disease

## 2020-03-20 DIAGNOSIS — C3490 Malignant neoplasm of unspecified part of unspecified bronchus or lung: Secondary | ICD-10-CM | POA: Insufficient documentation

## 2020-03-20 DIAGNOSIS — C7951 Secondary malignant neoplasm of bone: Secondary | ICD-10-CM | POA: Insufficient documentation

## 2020-03-20 DIAGNOSIS — I2510 Atherosclerotic heart disease of native coronary artery without angina pectoris: Secondary | ICD-10-CM | POA: Diagnosis not present

## 2020-03-20 DIAGNOSIS — I700 Atherosclerosis of aorta: Secondary | ICD-10-CM | POA: Insufficient documentation

## 2020-03-20 DIAGNOSIS — Z20822 Contact with and (suspected) exposure to covid-19: Secondary | ICD-10-CM | POA: Diagnosis not present

## 2020-03-20 LAB — GLUCOSE, CAPILLARY: Glucose-Capillary: 96 mg/dL (ref 70–99)

## 2020-03-20 LAB — SARS CORONAVIRUS 2 (TAT 6-24 HRS): SARS Coronavirus 2: NEGATIVE

## 2020-03-20 IMAGING — PT NM PET TUM IMG INITIAL (PI) SKULL BASE T - THIGH
7 of 8 series · 19 of 25 positions shown · non-contrast
Comparison: Chest CT from [DATE]

CLINICAL DATA: Initial treatment strategy for non-small cell lung
cancer staging.

EXAM:
NUCLEAR MEDICINE PET SKULL BASE TO THIGH
TECHNIQUE: 8.86 mCi F-18 FDG was injected intravenously. Full-ring PET imaging
was performed from the skull base to thigh after the radiotracer. CT
data was obtained and used for attenuation correction and anatomic
localization.
Fasting blood glucose: 96 mg/dl

[Series 3: pet sk_thigh ac · axial · 5.0mm · 4.07mm/px · z∈[-930,-6]mm · 5 of 232 slices shown]
[im 1/232]
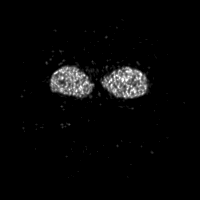
[im 47/232]
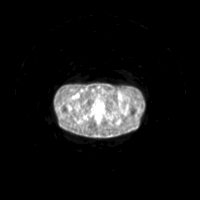
[im 139/232]
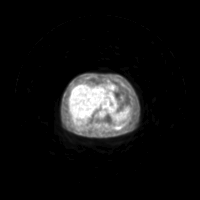
[im 185/232]
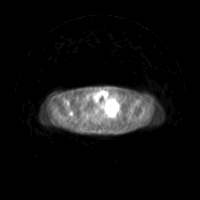
[im 232/232]
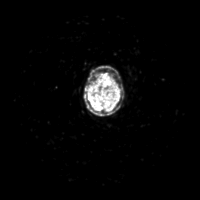

[Series 4: ct sk_thigh 5.0 b31f · axial · 5.0mm · 0.98mm/px · z∈[-702,-238]mm · 3 of 232 slices shown]
[im 58/232]
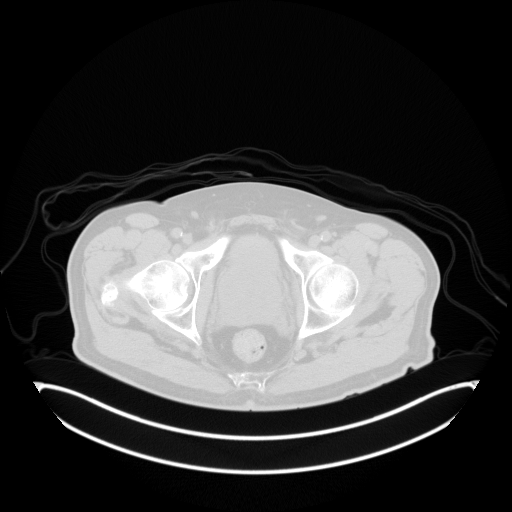
[im 116/232]
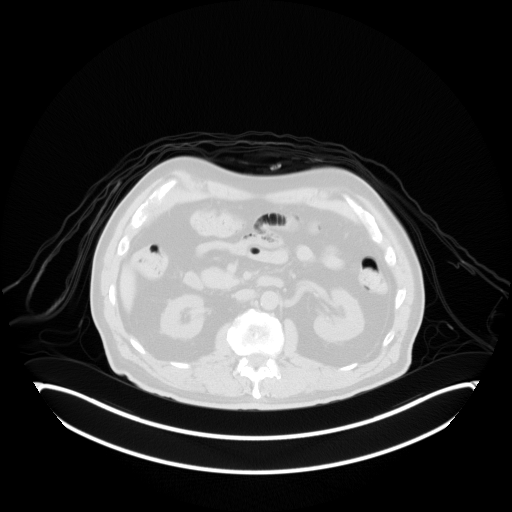
[im 174/232]
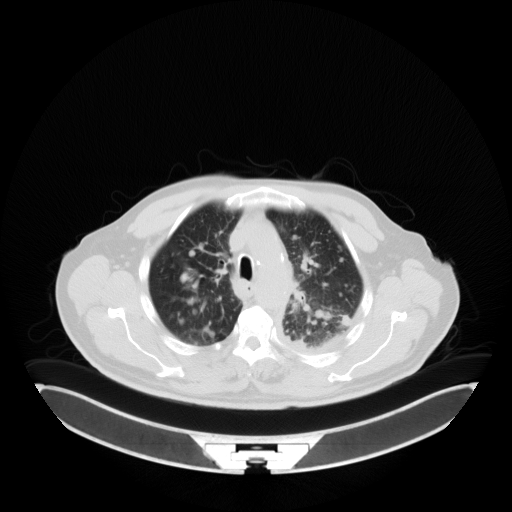

[Series 5: pet sk_thigh nac · axial · 5.0mm · 4.07mm/px · z∈[-930,-6]mm · 4 of 232 slices shown]
[im 1/232]
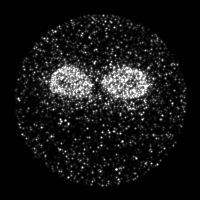
[im 58/232]
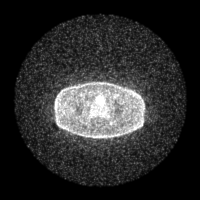
[im 116/232]
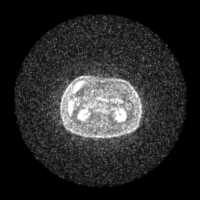
[im 232/232]
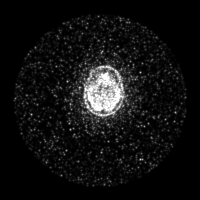

[Series 8: ct sk_thigh 5.0 b70f lung_bone · axial · 5.0mm · 0.60mm/px · 1 of 62 slices shown]
[im 1/62  bone]
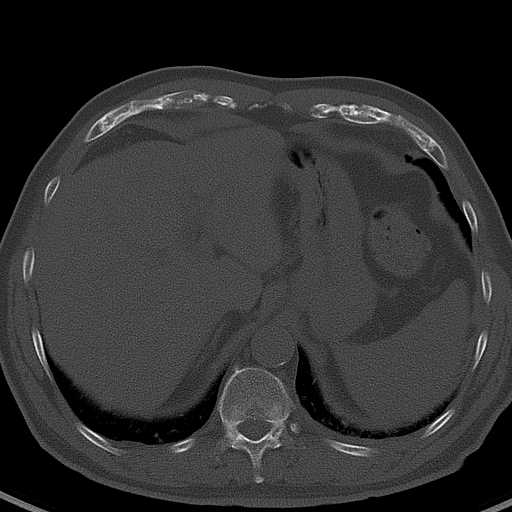

[Series 603: range-ct sk_thigh 5.0 (id)<alpha range> · 1 of 62 slices shown (1 of 2)]
[im 1/62]
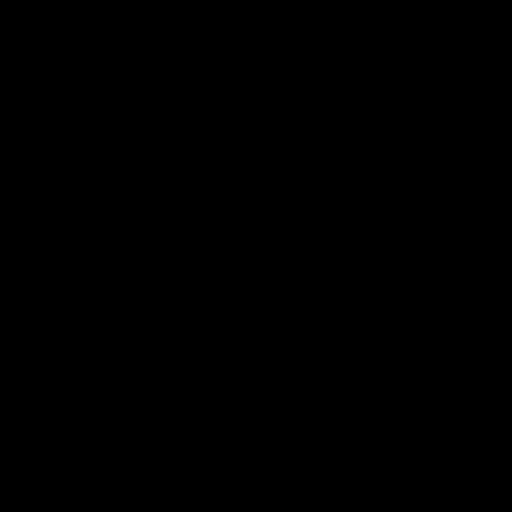

[Series 605: range-ct sk_thigh 5.0 (id)<alpha range> · 4 of 221 slices shown (2 of 2)]
[im 1/221]
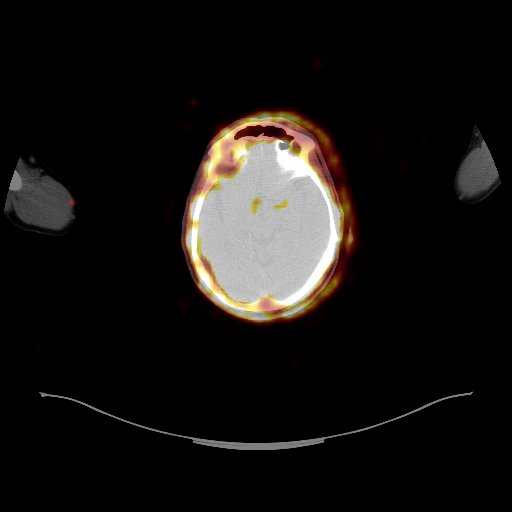
[im 56/221]
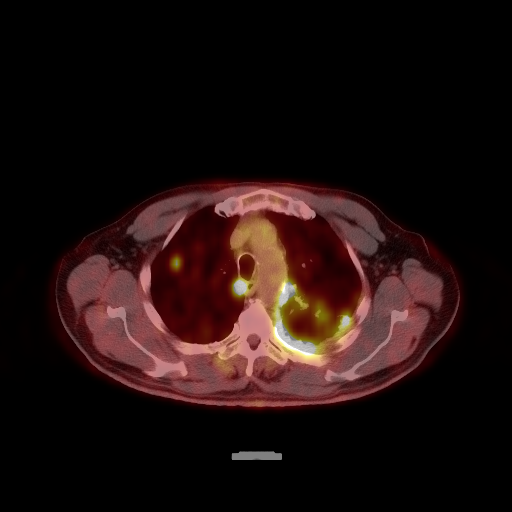
[im 111/221]
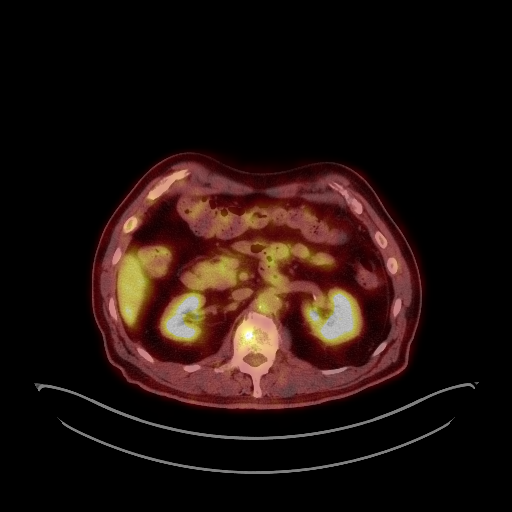
[im 221/221]
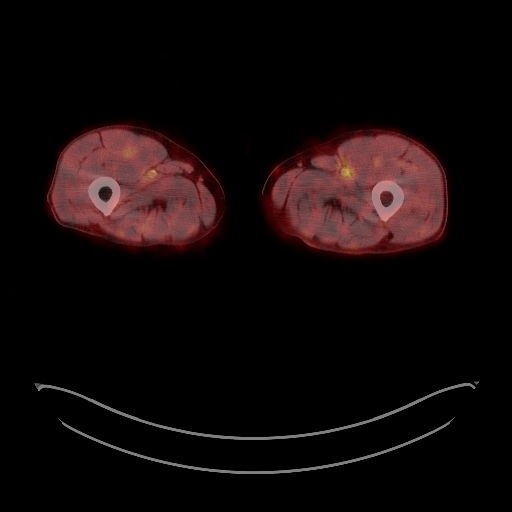

[Series 1091: results mm oncology reading · 1.0mm · 0.50mm/px · 1 of 13 slices shown]
[im 1/13]
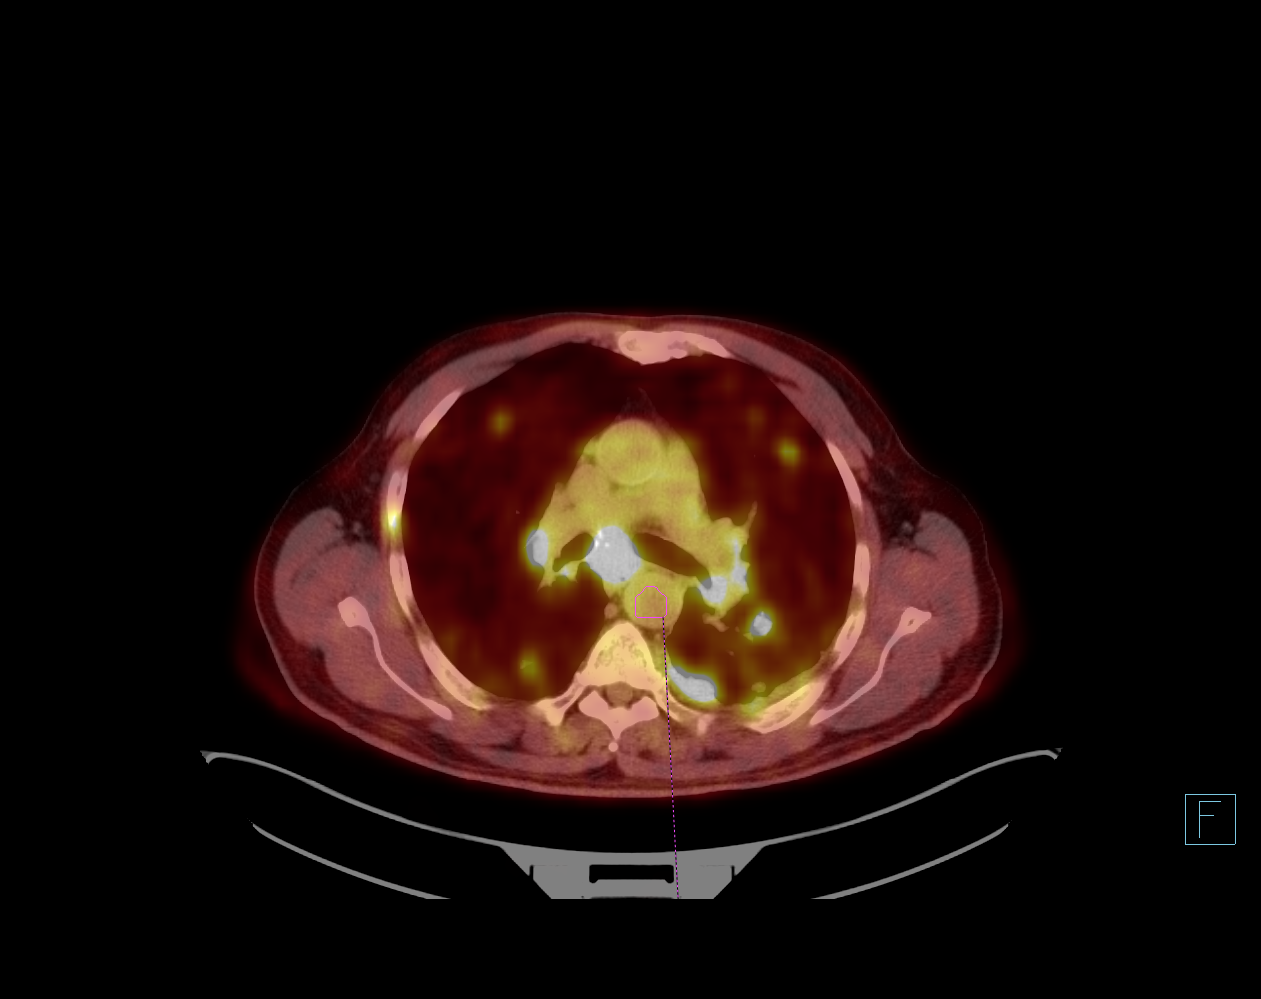

[19 of 25 positions shown; findings below may reference images not displayed]

FINDINGS: Mediastinal blood pool activity: SUV max

Liver activity: SUV max NA

NECK: Diffuse increased metabolic activity at the base of tongue is
relatively symmetric. Slightly asymmetric towards the LEFT in the
submandibular gland some mild fullness but without discrete mass in
this location

(SUVmax =

Diffusely hypermetabolic thyroid gland (SUVmax = 12.4)

Diffuse hypermetabolism at level 2 bilaterally adjacent to carotid
vasculature particularly on the LEFT.)

Incidental CT findings: none

CHEST: Diffusely abnormal exam with respect to the chest with
numerous areas of increased metabolic activity.

(Image 52, series 4) 14 mm RIGHT paratracheal lymph node (SUVmax =
14.4)

Dominant mass in the LEFT upper lobe measuring approximately 4.6 x
4.6 cm (SUVmax = 25. Mass invades the extrapleural fat in the LEFT
upper lobe in shows pleural involvement with circumferential
metabolic activity tracking to the LEFT lung apex as exhibited on
the recent diagnostic CT.

Bi lateral hilar, anterior mediastinal and subcarinal adenopathy
along with bilateral pulmonary nodules compatible with metastatic
disease.

Dominant lesion in the RIGHT chest measures 2.5 x 2.2 cm this shows
some central cavitation suggesting some necrosis and displays less
activity with some peripheral activity than other areas within the
chest. Diffuse pleural disease and signs of chest wall involvement
track through the LEFT chest particularly posteriorly on image 74 of
series 4 where there is overt chest wall and rib involvement of the
LEFT posterior eighth rib (SUVmax = 16.5)

Incidental CT findings: Calcified atheromatous plaque in the
thoracic aorta. Three-vessel coronary artery disease. Heart size is
normal. No pericardial fluid.

ABDOMEN/PELVIS: Heterogeneity of hepatic uptake without discrete,
focal hepatic lesion. Some areas on altered window wing suggested in
the dome of the liver (image 89 of series 3 (SUVmax = 6.4)

Focal antral uptake in the stomach with some uptake also in the mid
stomach without PET correlate. Adrenal glands are normal. Perianal
uptake, nonspecific.

Incidental CT findings: No acute gastrointestinal process marked
prostate heterogeneity and enlargement with variable uptake,
nonspecific finding. (SUVmax = 5.3 but with diffusely variable
uptake throughout the prostate. Calcified atheromatous plaque of the
abdominal aorta extending in the iliac vessels.)

SKELETON: Is signs of multifocal bony metastatic disease involving
the sacrum, cervical, thoracic and lumbar spine as well as the RIGHT
scapula. Multifocal rib lesions also demonstrated in addition to
direct involvement from pleural and chest wall disease in the upper
LEFT chest. Bilateral pelvic metastatic lesions. These involve iliac
pubic bones and sacral ala as discussed.

C2 lesion (SUVmax = 7.1)

T8 lesion (SUVmax = 7.5)

L1 lesion (SUVmax = 11.5)

Example of sacral involvement in the RIGHT sacrum (SUVmax =
(image 155, series 4) many of these lesions without definitive CT
correlates.

LEFT pubic bone, anteriorly in the inferior pubic ramus (SUVmax =
9.5)

Example of rib lesion away from extensive pleural disease in the
anterior LEFT chest involving the LEFT anterior second rib (SUVmax =
7.1 (image 63, series 4) multifocal rib lesions are seen bilaterally
with similar appearance.

Similar appearance of RIGHT scapular metastatic disease.

Involvement of the cervical spine may be more extensive than
suggested there is some motion which does limit assessment and there
is variable uptake in the head in general as compared to the
remainder of the exam. )

Incidental CT findings: Uptake along the costochondral margin (image
118, series 4) (SUVmax = 6.6 with potential costochondral fracture
in this location)
IMPRESSION: 1. Widespread metastatic disease in the chest and 2 multifocal areas
within the spine, ribs and pelvis as well as RIGHT scapula.
2. Increased metabolic activity about the base of tongue bilaterally
and potentially in cervical lymph nodes though discrete nodes are
not well demonstrated. Some of this activity in the neck could be
related to acquisition artifact as there is diffuse activity about
the face as well which has no CT correlate. Primary neoplasm is also
considered in this location. Direct visualization may be warranted.
3. Diffuse thyroid uptake correlate with thyroid function and
sonographic assessment.
4. Direct chest wall extension and multifocal rib involvement from
direct extension and metastases.
5. Heterogeneous prostate is a nonspecific finding but with
increased uptake would consider correlation with PSA for further
assessment.
6. Nonspecific perianal uptake and gastric uptake, dedicated imaging
of the abdomen and pelvis with diagnostic CT could potentially be of
benefit to assess for any focal lesion, none seen on current PET
though there is mild antral thickening.
7. Potential nondisplaced fracture of the costochondral junction
inferiorly along the costal margin as described.

These results will be called to the ordering clinician or
representative by the Radiologist Assistant, and communication
documented in the PACS or [REDACTED].

## 2020-03-20 MED ORDER — FLUDEOXYGLUCOSE F - 18 (FDG) INJECTION
8.8600 | Freq: Once | INTRAVENOUS | Status: AC | PRN
  Administered 2020-03-20: 8.86 via INTRAVENOUS

## 2020-03-20 NOTE — Telephone Encounter (Signed)
Spoke with the pt  [name] asking about risk involved with bronch  Advised per SG- very rare risk of bleeding or pneumothorax  Pt verbalized understanding

## 2020-03-20 NOTE — Progress Notes (Signed)
Spoke with pt's wife for pre-op call after getting permission from Mr. Alcock. She states pt does not have a cardiac history and is not diabetic. States he has been treated for HTN in the past but no longer takes medications because it is under control.   Covid test done today and Mrs. Vecchio states pt is in quarantine and understands that he stays in quarantine until he comes to the hospital on Tuesday.

## 2020-03-20 NOTE — Telephone Encounter (Signed)
lmom to call back 

## 2020-03-20 NOTE — Telephone Encounter (Signed)
Pt's wife returning call.  [phone]

## 2020-03-24 ENCOUNTER — Encounter (HOSPITAL_COMMUNITY)
Admission: RE | Disposition: A | Discharge status: Home / Self Care | Payer: Self-pay | Source: Home / Self Care | Attending: Internal Medicine

## 2020-03-24 ENCOUNTER — Ambulatory Visit (HOSPITAL_COMMUNITY): Payer: Medicare PPO | Admitting: Anesthesiology

## 2020-03-24 ENCOUNTER — Other Ambulatory Visit: Payer: Self-pay

## 2020-03-24 ENCOUNTER — Encounter: Payer: Self-pay | Admitting: *Deleted

## 2020-03-24 ENCOUNTER — Encounter (HOSPITAL_COMMUNITY): Payer: Self-pay | Admitting: Internal Medicine

## 2020-03-24 ENCOUNTER — Ambulatory Visit (HOSPITAL_COMMUNITY)
Admission: RE | Admit: 2020-03-24 | Discharge: 2020-03-24 | Disposition: A | Discharge status: Home / Self Care | Payer: Medicare PPO | Attending: Internal Medicine | Admitting: Internal Medicine

## 2020-03-24 DIAGNOSIS — Z87891 Personal history of nicotine dependence: Secondary | ICD-10-CM | POA: Insufficient documentation

## 2020-03-24 DIAGNOSIS — I10 Essential (primary) hypertension: Secondary | ICD-10-CM | POA: Insufficient documentation

## 2020-03-24 DIAGNOSIS — C7800 Secondary malignant neoplasm of unspecified lung: Secondary | ICD-10-CM | POA: Insufficient documentation

## 2020-03-24 DIAGNOSIS — C771 Secondary and unspecified malignant neoplasm of intrathoracic lymph nodes: Secondary | ICD-10-CM | POA: Diagnosis not present

## 2020-03-24 DIAGNOSIS — C801 Malignant (primary) neoplasm, unspecified: Secondary | ICD-10-CM | POA: Diagnosis not present

## 2020-03-24 DIAGNOSIS — R590 Localized enlarged lymph nodes: Secondary | ICD-10-CM | POA: Insufficient documentation

## 2020-03-24 DIAGNOSIS — R918 Other nonspecific abnormal finding of lung field: Secondary | ICD-10-CM

## 2020-03-24 HISTORY — PX: BRONCHIAL NEEDLE ASPIRATION BIOPSY: SHX5106

## 2020-03-24 HISTORY — DX: Gastro-esophageal reflux disease without esophagitis: K21.9

## 2020-03-24 HISTORY — PX: VIDEO BRONCHOSCOPY WITH ENDOBRONCHIAL ULTRASOUND: SHX6177

## 2020-03-24 LAB — APTT: aPTT: 31 seconds (ref 24–36)

## 2020-03-24 LAB — PROTIME-INR
INR: 1.1 (ref 0.8–1.2)
Prothrombin Time: 13.7 seconds (ref 11.4–15.2)

## 2020-03-24 LAB — TYPE AND SCREEN
ABO/RH(D): O POS
Antibody Screen: NEGATIVE

## 2020-03-24 LAB — ABO/RH: ABO/RH(D): O POS

## 2020-03-24 SURGERY — BRONCHOSCOPY, WITH EBUS
Anesthesia: General

## 2020-03-24 MED ORDER — CHLORHEXIDINE GLUCONATE 0.12 % MT SOLN
OROMUCOSAL | Status: AC
  Administered 2020-03-24: 15 mL
  Filled 2020-03-24: qty 15

## 2020-03-24 MED ORDER — LIDOCAINE 2% (20 MG/ML) 5 ML SYRINGE
INTRAMUSCULAR | Status: DC | PRN
  Administered 2020-03-24: 60 mg via INTRAVENOUS

## 2020-03-24 MED ORDER — ACETAMINOPHEN 500 MG PO TABS
1000.0000 mg | ORAL_TABLET | Freq: Once | ORAL | Status: AC
  Administered 2020-03-24: 1000 mg via ORAL
  Filled 2020-03-24: qty 2

## 2020-03-24 MED ORDER — PROPOFOL 10 MG/ML IV BOLUS
INTRAVENOUS | Status: DC | PRN
  Administered 2020-03-24: 120 mg via INTRAVENOUS

## 2020-03-24 MED ORDER — SUGAMMADEX SODIUM 200 MG/2ML IV SOLN
INTRAVENOUS | Status: DC | PRN
  Administered 2020-03-24 (×2): 100 mg via INTRAVENOUS

## 2020-03-24 MED ORDER — PHENYLEPHRINE 40 MCG/ML (10ML) SYRINGE FOR IV PUSH (FOR BLOOD PRESSURE SUPPORT)
PREFILLED_SYRINGE | INTRAVENOUS | Status: DC | PRN
  Administered 2020-03-24 (×2): 80 ug via INTRAVENOUS

## 2020-03-24 MED ORDER — LACTATED RINGERS IV SOLN: INTRAVENOUS | Status: DC

## 2020-03-24 MED ORDER — DEXAMETHASONE SODIUM PHOSPHATE 10 MG/ML IJ SOLN
INTRAMUSCULAR | Status: DC | PRN
  Administered 2020-03-24: 5 mg via INTRAVENOUS

## 2020-03-24 MED ORDER — ROCURONIUM BROMIDE 10 MG/ML (PF) SYRINGE
PREFILLED_SYRINGE | INTRAVENOUS | Status: DC | PRN
  Administered 2020-03-24: 30 mg via INTRAVENOUS

## 2020-03-24 MED ORDER — ONDANSETRON HCL 4 MG/2ML IJ SOLN
INTRAMUSCULAR | Status: DC | PRN
  Administered 2020-03-24: 4 mg via INTRAVENOUS

## 2020-03-24 MED ORDER — FENTANYL CITRATE (PF) 250 MCG/5ML IJ SOLN
INTRAMUSCULAR | Status: DC | PRN
  Administered 2020-03-24: 50 ug via INTRAVENOUS

## 2020-03-24 MED ORDER — SUCCINYLCHOLINE CHLORIDE 200 MG/10ML IV SOSY
PREFILLED_SYRINGE | INTRAVENOUS | Status: DC | PRN
  Administered 2020-03-24: 100 mg via INTRAVENOUS

## 2020-03-24 SURGICAL SUPPLY — 33 items
ADAPTER VALVE BIOPSY EBUS (MISCELLANEOUS) IMPLANT
ADPTR VALVE BIOPSY EBUS (MISCELLANEOUS)
BRUSH CYTOL CELLEBRITY 1.5X140 (MISCELLANEOUS) IMPLANT
CANISTER SUCT 3000ML PPV (MISCELLANEOUS) ×3 IMPLANT
CONT SPEC 4OZ CLIKSEAL STRL BL (MISCELLANEOUS) ×3 IMPLANT
COVER BACK TABLE 60X90IN (DRAPES) ×3 IMPLANT
FORCEPS BIOP RJ4 1.8 (CUTTING FORCEPS) IMPLANT
GAUZE SPONGE 4X4 12PLY STRL (GAUZE/BANDAGES/DRESSINGS) ×3 IMPLANT
GLOVE BIO SURGEON STRL SZ7.5 (GLOVE) ×3 IMPLANT
GOWN STRL REUS W/ TWL LRG LVL3 (GOWN DISPOSABLE) ×2 IMPLANT
GOWN STRL REUS W/ TWL XL LVL3 (GOWN DISPOSABLE) ×2 IMPLANT
GOWN STRL REUS W/TWL LRG LVL3 (GOWN DISPOSABLE) ×1
GOWN STRL REUS W/TWL XL LVL3 (GOWN DISPOSABLE) ×1
KIT CLEAN ENDO COMPLIANCE (KITS) ×6 IMPLANT
KIT TURNOVER KIT B (KITS) ×3 IMPLANT
MARKER SKIN DUAL TIP RULER LAB (MISCELLANEOUS) ×3 IMPLANT
NEEDLE ASPIRATION VIZISHOT 19G (NEEDLE) IMPLANT
NEEDLE ASPIRATION VIZISHOT 21G (NEEDLE) IMPLANT
NS IRRIG 1000ML POUR BTL (IV SOLUTION) ×3 IMPLANT
OIL SILICONE PENTAX (PARTS (SERVICE/REPAIRS)) ×3 IMPLANT
PAD ARMBOARD 7.5X6 YLW CONV (MISCELLANEOUS) ×6 IMPLANT
SYR 20ML ECCENTRIC (SYRINGE) ×6 IMPLANT
SYR 20ML LL LF (SYRINGE) ×6 IMPLANT
SYR 50ML SLIP (SYRINGE) IMPLANT
SYR 5ML LUER SLIP (SYRINGE) ×3 IMPLANT
TOWEL GREEN STERILE FF (TOWEL DISPOSABLE) ×3 IMPLANT
TRAP SPECIMEN MUCOUS 40CC (MISCELLANEOUS) IMPLANT
TUBE CONNECTING 20X1/4 (TUBING) ×6 IMPLANT
UNDERPAD 30X30 (UNDERPADS AND DIAPERS) ×3 IMPLANT
VALVE BIOPSY  SINGLE USE (MISCELLANEOUS) ×1
VALVE BIOPSY SINGLE USE (MISCELLANEOUS) ×2 IMPLANT
VALVE SUCTION BRONCHIO DISP (MISCELLANEOUS) ×3 IMPLANT
WATER STERILE IRR 1000ML POUR (IV SOLUTION) ×3 IMPLANT

## 2020-03-24 NOTE — Anesthesia Postprocedure Evaluation (Signed)
Anesthesia Post Note  Patient: Phillip Franklin  Procedure(s) Performed: VIDEO BRONCHOSCOPY WITH ENDOBRONCHIAL ULTRASOUND (N/A ) BRONCHIAL NEEDLE ASPIRATION BIOPSIES     Patient location during evaluation: PACU Anesthesia Type: General Level of consciousness: awake and alert Pain management: pain level controlled Vital Signs Assessment: post-procedure vital signs reviewed and stable Respiratory status: spontaneous breathing, nonlabored ventilation, respiratory function stable and patient connected to nasal cannula oxygen Cardiovascular status: blood pressure returned to baseline and stable Postop Assessment: no apparent nausea or vomiting Anesthetic complications: no   No complications documented.  Last Vitals:  Vitals:   03/24/20 1045 03/24/20 1100  BP: (!) 152/72 (!) 149/75  Pulse: 87 80  Resp: 17 16  Temp:  36.6 C  SpO2: 97% 96%    Last Pain:  Vitals:   03/24/20 1105  TempSrc:   PainSc: 0-No pain                 Tiajuana Amass

## 2020-03-24 NOTE — Interval H&P Note (Signed)
History and Physical Interval Note:  03/24/2020 8:16 AM  Phillip Franklin  has presented today for surgery, with the diagnosis of LUNG MASS.  The various methods of treatment have been discussed with the patient and family. After consideration of risks, benefits and other options for treatment, the patient has consented to  Procedure(s): [place] (N/A) as a surgical intervention.  The patient's history has been reviewed, patient examined, no change in status, stable for surgery.  I have reviewed the patient's chart and labs.  Questions were answered to the patient's satisfaction.     Candee Furbish

## 2020-03-24 NOTE — Op Note (Signed)
Flexible and EBUS Bronchoscopy Procedure Note  EVANGELOS PAULINO  [id number]  1941/05/02  Date:03/24/20  Time:10:17 AM   Provider Performing:Elton Heid C Tamala Julian   Procedure: Flexible bronchoscopy and EBUS Bronchoscopy  Indication(s) Adenopathy  Consent Risks of the procedure as well as the alternatives and risks of each were explained to the patient and/or caregiver.  Consent for the procedure was obtained.  Anesthesia General Anesthesia   Time Out Verified patient identification, verified procedure, site/side was marked, verified correct patient position, special equipment/implants available, medications/allergies/relevant history reviewed, required imaging and test results available.   Sterile Technique Usual hand hygiene, masks, gowns, and gloves were used   Procedure Description Diagnostic bronchoscope advanced through endotracheal tube and into airway.  Airways were examined down to subsegmental level with findings noted below.  The diagnostic bronchoscope was then removed and the EBUS bronchoscope was advanced into airway with station 7 biopsied and sent for slide, cell block, and/or culture.  The EBUS bronchoscope was removed after assuring no active bleeding from biopsy site.  Findings: No endobronchial lesions.  Enlarged subcarinal lesion sampled for slide x 1, cell block x 6.   Complications/Tolerance None; patient tolerated the procedure well. Chest X-ray is not needed post procedure.   EBL Minimal   Specimen(s) Station 7 slide and cell block

## 2020-03-24 NOTE — Progress Notes (Signed)
Per Dr. Julien Nordmann, I called patient and set them up to have labs completed tomorrow.  Patient and wife verbalized understanding of appt.

## 2020-03-24 NOTE — Consult Note (Signed)
Synopsis: Mediastinal adenopathy  Assessment & Plan:  Problem 1 Metastatic cancer with mediastinal adenopathy in patient with distant hx smoking- warrants tissue biopsy - Bronch EBUS today - Further care per Dr. Julien Nordmann pending path results  MDM . I reviewed prior external note(s) from Dr. Julien Nordmann on  . I reviewed the result(s) of COVID test on 7/2 which is negative . I have ordered EBUS bronchoscopy today  Review of patient's CT chest 6/18 images revealed left upper lobe mass with bilateral pulmonary metastases, mediastinal adenopathy and left pleural mets. The patient's images have been independently reviewed by me.    End of visit medications:  Current Facility-Administered Medications:  .  acetaminophen (TYLENOL) tablet 1,000 mg, 1,000 mg, Oral, Once, Suzette Battiest, MD   Candee Furbish, MD [facility] 03/24/2020 8:14 AM    Subjective:   PATIENT ID: Phillip Franklin GENDER: male DOB: 1940-11-10, MRN: [id number]  No chief complaint on file.   HPI 79 year old man presenting with remote history of smoking (quit over 30 years ago, 35 pack years) presenting with dry cough and unintentional weight loss for past 3 months.  Complaints on ROS include anorexia, fatigue, dry cough.  Ancillary information including prior medications, full medical/surgical/family/social histoies, and PFTs (when available) are listed below and have been reviewed.   ROS + symptoms in bold Fevers, chills, weight loss Nausea, vomiting, diarrhea Shortness of breath, wheezing, cough Chest pain, palpitations, lower ext edema   Objective:   Vitals:   03/24/20 0812 03/24/20 0814  BP: (!) 172/70   Pulse: 85   Resp: 18   TempSrc: Oral   SpO2: 95%   Weight:  72.6 kg  Height: 5\' 8"  (1.727 m)    95% on RA BMI Readings from Last 3 Encounters:  03/24/20 24.33 kg/m   Wt Readings from Last 3 Encounters:  03/24/20 72.6 kg  03/13/20 74.3 kg    GEN: elderly man in no acute  distress HEENT: trachea midline, mucus membranes moist CV: Regular rate and rhythm, extremities are warm PULM: Clear, no wheezing GI: Soft, +BS EXT: no edema NEURO: Moves all 4 extremities   Ancillary Information    Past Medical History:  Diagnosis Date  . Depression   . GERD (gastroesophageal reflux disease)    occasional, diet controlled  . High cholesterol   . Hypertension    no longer taking medication - states it's undercontrol  . Hypothyroid      Family History  Problem Relation Age of Onset  . Heart disease Father      Past Surgical History:  Procedure Laterality Date  . HEMORRHOID SURGERY      Social History   Socioeconomic History  . Marital status: Married    Spouse name: Not on file  . Number of children: Not on file  . Years of education: Not on file  . Highest education level: Not on file  Occupational History  . Not on file  Tobacco Use  . Smoking status: Former Smoker    Types: Cigarettes, Pipe    Quit date: 11/13/1977    Years since quitting: 42.3  . Smokeless tobacco: Never Used  Vaping Use  . Vaping Use: Never used  Substance and Sexual Activity  . Alcohol use: No  . Drug use: No  . Sexual activity: Not on file  Other Topics Concern  . Not on file  Social History Narrative  . Not on file   Social Determinants of Health  Financial Resource Strain:   . Difficulty of Paying Living Expenses:   Food Insecurity:   . Worried About Charity fundraiser in the Last Year:   . Arboriculturist in the Last Year:   Transportation Needs:   . Film/video editor (Medical):   Marland Kitchen Lack of Transportation (Non-Medical):   Physical Activity:   . Days of Exercise per Week:   . Minutes of Exercise per Session:   Stress:   . Feeling of Stress :   Social Connections:   . Frequency of Communication with Friends and Family:   . Frequency of Social Gatherings with Friends and Family:   . Attends Religious Services:   . Active Member of Clubs or  Organizations:   . Attends Archivist Meetings:   Marland Kitchen Marital Status:   Intimate Partner Violence:   . Fear of Current or Ex-Partner:   . Emotionally Abused:   Marland Kitchen Physically Abused:   . Sexually Abused:      Allergies  Allergen Reactions  . Codeine Other (See Comments)    hallucinations  . Hydrocodone Other (See Comments)    hallucinations  . Oxycodone Other (See Comments)    hallucinations  . Penicillins Nausea And Vomiting     CBC    Component Value Date/Time   WBC 11.1 (H) 03/13/2020 0810   WBC 9.1 11/13/2016 1237   RBC 4.88 03/13/2020 0810   HGB 14.0 03/13/2020 0810   HCT 44.1 03/13/2020 0810   PLT 288 03/13/2020 0810   MCV 90.4 03/13/2020 0810   MCH 28.7 03/13/2020 0810   MCHC 31.7 03/13/2020 0810   RDW 14.1 03/13/2020 0810   LYMPHSABS 2.0 03/13/2020 0810   MONOABS 1.0 03/13/2020 0810   EOSABS 0.2 03/13/2020 0810   BASOSABS 0.0 03/13/2020 0810    Pulmonary Functions Testing Results: No flowsheet data found.

## 2020-03-24 NOTE — Discharge Instructions (Signed)
Flexible Bronchoscopy, Care After This sheet gives you information about how to care for yourself after your test. Your doctor may also give you more specific instructions. If you have problems or questions, contact your doctor. Follow these instructions at home: Driving  Do not drive for 24 hours if you were given a medicine to help you relax (sedative).  Do not drive or use heavy machinery while taking prescription pain medicine. General instructions   Take over-the-counter and prescription medicines only as told by your doctor.  Return to your normal activities as told. Ask what activities are safe for you.  Do not use any products that have nicotine or tobacco in them. This includes cigarettes and e-cigarettes. If you need help quitting, ask your doctor.  Keep all follow-up visits as told by your doctor. This is important. It is very important if you had a tissue sample (biopsy) taken. Get help right away if:  You have shortness of breath that gets worse.  You get light-headed.  You feel like you are going to pass out (faint).  You have chest pain.  You cough up: ? More than a little blood. ? More blood than before. Summary  Do not eat or drink anything (not even water) for 2 hours after your test, or until your numbing medicine wears off.  Do not use cigarettes. Do not use e-cigarettes.  Get help right away if you have chest pain. This information is not intended to replace advice given to you by your health care provider. Make sure you discuss any questions you have with your health care provider. Document Revised: 08/18/2017 Document Reviewed: 09/23/2016 Elsevier Patient Education  2020 [employer].

## 2020-03-24 NOTE — Transfer of Care (Signed)
Immediate Anesthesia Transfer of Care Note  Patient: Phillip Franklin  Procedure(s) Performed: VIDEO BRONCHOSCOPY WITH ENDOBRONCHIAL ULTRASOUND (N/A ) BRONCHIAL NEEDLE ASPIRATION BIOPSIES  Patient Location: PACU  Anesthesia Type:General  Level of Consciousness: awake, alert  and oriented  Airway & Oxygen Therapy: Patient Spontanous Breathing and Patient connected to face mask oxygen  Post-op Assessment: Report given to RN and Post -op Vital signs reviewed and stable  Post vital signs: Reviewed and stable  Last Vitals:  Vitals Value Taken Time  BP 156/71 03/24/20 1035  Temp 36.2 C 03/24/20 1030  Pulse 88 03/24/20 1043  Resp 16 03/24/20 1043  SpO2 97 % 03/24/20 1043  Vitals shown include unvalidated device data.  Last Pain:  Vitals:   03/24/20 1030  TempSrc:   PainSc: 0-No pain         Complications: No complications documented.

## 2020-03-24 NOTE — H&P (View-Only) (Signed)
Synopsis: Mediastinal adenopathy  Assessment & Plan:  Problem 1 Metastatic cancer with mediastinal adenopathy in patient with distant hx smoking- warrants tissue biopsy - Bronch EBUS today - Further care per Dr. Julien Nordmann pending path results  MDM . I reviewed prior external note(s) from Dr. Julien Nordmann on  . I reviewed the result(s) of COVID test on 7/2 which is negative . I have ordered EBUS bronchoscopy today  Review of patient's CT chest 6/18 images revealed left upper lobe mass with bilateral pulmonary metastases, mediastinal adenopathy and left pleural mets. The patient's images have been independently reviewed by me.    End of visit medications:  Current Facility-Administered Medications:  .  acetaminophen (TYLENOL) tablet 1,000 mg, 1,000 mg, Oral, Once, Suzette Battiest, MD   Candee Furbish, MD [facility] 03/24/2020 8:14 AM    Subjective:   PATIENT ID: Phillip Franklin GENDER: male DOB: Jun 02, 1941, MRN: [id number]  No chief complaint on file.   HPI 79 year old man presenting with remote history of smoking (quit over 30 years ago, 35 pack years) presenting with dry cough and unintentional weight loss for past 3 months.  Complaints on ROS include anorexia, fatigue, dry cough.  Ancillary information including prior medications, full medical/surgical/family/social histoies, and PFTs (when available) are listed below and have been reviewed.   ROS + symptoms in bold Fevers, chills, weight loss Nausea, vomiting, diarrhea Shortness of breath, wheezing, cough Chest pain, palpitations, lower ext edema   Objective:   Vitals:   03/24/20 0812 03/24/20 0814  BP: (!) 172/70   Pulse: 85   Resp: 18   TempSrc: Oral   SpO2: 95%   Weight:  72.6 kg  Height: 5\' 8"  (1.727 m)    95% on RA BMI Readings from Last 3 Encounters:  03/24/20 24.33 kg/m   Wt Readings from Last 3 Encounters:  03/24/20 72.6 kg  03/13/20 74.3 kg    GEN: elderly man in no acute  distress HEENT: trachea midline, mucus membranes moist CV: Regular rate and rhythm, extremities are warm PULM: Clear, no wheezing GI: Soft, +BS EXT: no edema NEURO: Moves all 4 extremities   Ancillary Information    Past Medical History:  Diagnosis Date  . Depression   . GERD (gastroesophageal reflux disease)    occasional, diet controlled  . High cholesterol   . Hypertension    no longer taking medication - states it's undercontrol  . Hypothyroid      Family History  Problem Relation Age of Onset  . Heart disease Father      Past Surgical History:  Procedure Laterality Date  . HEMORRHOID SURGERY      Social History   Socioeconomic History  . Marital status: Married    Spouse name: Not on file  . Number of children: Not on file  . Years of education: Not on file  . Highest education level: Not on file  Occupational History  . Not on file  Tobacco Use  . Smoking status: Former Smoker    Types: Cigarettes, Pipe    Quit date: 11/13/1977    Years since quitting: 42.3  . Smokeless tobacco: Never Used  Vaping Use  . Vaping Use: Never used  Substance and Sexual Activity  . Alcohol use: No  . Drug use: No  . Sexual activity: Not on file  Other Topics Concern  . Not on file  Social History Narrative  . Not on file   Social Determinants of Health  Financial Resource Strain:   . Difficulty of Paying Living Expenses:   Food Insecurity:   . Worried About Charity fundraiser in the Last Year:   . Arboriculturist in the Last Year:   Transportation Needs:   . Film/video editor (Medical):   Marland Kitchen Lack of Transportation (Non-Medical):   Physical Activity:   . Days of Exercise per Week:   . Minutes of Exercise per Session:   Stress:   . Feeling of Stress :   Social Connections:   . Frequency of Communication with Friends and Family:   . Frequency of Social Gatherings with Friends and Family:   . Attends Religious Services:   . Active Member of Clubs or  Organizations:   . Attends Archivist Meetings:   Marland Kitchen Marital Status:   Intimate Partner Violence:   . Fear of Current or Ex-Partner:   . Emotionally Abused:   Marland Kitchen Physically Abused:   . Sexually Abused:      Allergies  Allergen Reactions  . Codeine Other (See Comments)    hallucinations  . Hydrocodone Other (See Comments)    hallucinations  . Oxycodone Other (See Comments)    hallucinations  . Penicillins Nausea And Vomiting     CBC    Component Value Date/Time   WBC 11.1 (H) 03/13/2020 0810   WBC 9.1 11/13/2016 1237   RBC 4.88 03/13/2020 0810   HGB 14.0 03/13/2020 0810   HCT 44.1 03/13/2020 0810   PLT 288 03/13/2020 0810   MCV 90.4 03/13/2020 0810   MCH 28.7 03/13/2020 0810   MCHC 31.7 03/13/2020 0810   RDW 14.1 03/13/2020 0810   LYMPHSABS 2.0 03/13/2020 0810   MONOABS 1.0 03/13/2020 0810   EOSABS 0.2 03/13/2020 0810   BASOSABS 0.0 03/13/2020 0810    Pulmonary Functions Testing Results: No flowsheet data found.

## 2020-03-24 NOTE — Anesthesia Preprocedure Evaluation (Signed)
Anesthesia Evaluation  Patient identified by MRN, date of birth, ID band Patient awake    Reviewed: Allergy & Precautions, NPO status , Patient's Chart, lab work & pertinent test results  Airway Mallampati: II   Neck ROM: Full    Dental  (+) Dental Advisory Given   Pulmonary former smoker,    breath sounds clear to auscultation       Cardiovascular hypertension, Pt. on medications  Rhythm:Regular Rate:Normal     Neuro/Psych negative neurological ROS     GI/Hepatic Neg liver ROS, GERD  ,  Endo/Other  Hypothyroidism   Renal/GU negative Renal ROS     Musculoskeletal   Abdominal   Peds  Hematology negative hematology ROS (+)   Anesthesia Other Findings   Reproductive/Obstetrics                             Anesthesia Physical Anesthesia Plan  ASA: III  Anesthesia Plan: General   Post-op Pain Management:    Induction: Intravenous  PONV Risk Score and Plan: 2 and Dexamethasone, Ondansetron and Treatment may vary due to age or medical condition  Airway Management Planned: Oral ETT  Additional Equipment: None  Intra-op Plan:   Post-operative Plan: Extubation in OR  Informed Consent: I have reviewed the patients History and Physical, chart, labs and discussed the procedure including the risks, benefits and alternatives for the proposed anesthesia with the patient or authorized representative who has indicated his/her understanding and acceptance.     Dental advisory given  Plan Discussed with: CRNA  Anesthesia Plan Comments:         Anesthesia Quick Evaluation

## 2020-03-24 NOTE — Anesthesia Procedure Notes (Signed)
Procedure Name: Intubation Date/Time: 03/24/2020 9:34 AM Performed by: Trinna Post., CRNA Pre-anesthesia Checklist: Patient identified, Emergency Drugs available, Suction available, Patient being monitored and Timeout performed Patient Re-evaluated:Patient Re-evaluated prior to induction Oxygen Delivery Method: Circle system utilized Preoxygenation: Pre-oxygenation with 100% oxygen Induction Type: IV induction and Rapid sequence Ventilation: Mask ventilation without difficulty Laryngoscope Size: Glidescope and 3 Grade View: Grade I Tube type: Oral Tube size: 8.5 mm Number of attempts: 1 Airway Equipment and Method: Rigid stylet and Video-laryngoscopy Placement Confirmation: ETT inserted through vocal cords under direct vision,  positive ETCO2 and breath sounds checked- equal and bilateral Secured at: 22 cm Tube secured with: Tape Dental Injury: Teeth and Oropharynx as per pre-operative assessment

## 2020-03-25 ENCOUNTER — Encounter: Payer: Self-pay | Admitting: Internal Medicine

## 2020-03-25 ENCOUNTER — Other Ambulatory Visit: Payer: Self-pay

## 2020-03-25 ENCOUNTER — Inpatient Hospital Stay: Payer: Medicare PPO | Attending: Internal Medicine

## 2020-03-25 ENCOUNTER — Telehealth: Payer: Self-pay | Admitting: Internal Medicine

## 2020-03-25 DIAGNOSIS — R918 Other nonspecific abnormal finding of lung field: Secondary | ICD-10-CM

## 2020-03-25 DIAGNOSIS — C3490 Malignant neoplasm of unspecified part of unspecified bronchus or lung: Secondary | ICD-10-CM | POA: Insufficient documentation

## 2020-03-25 DIAGNOSIS — Z5112 Encounter for antineoplastic immunotherapy: Secondary | ICD-10-CM | POA: Insufficient documentation

## 2020-03-25 DIAGNOSIS — Z87891 Personal history of nicotine dependence: Secondary | ICD-10-CM | POA: Insufficient documentation

## 2020-03-25 DIAGNOSIS — R634 Abnormal weight loss: Secondary | ICD-10-CM | POA: Insufficient documentation

## 2020-03-25 DIAGNOSIS — R05 Cough: Secondary | ICD-10-CM | POA: Insufficient documentation

## 2020-03-25 DIAGNOSIS — C7951 Secondary malignant neoplasm of bone: Secondary | ICD-10-CM | POA: Insufficient documentation

## 2020-03-25 DIAGNOSIS — C7931 Secondary malignant neoplasm of brain: Secondary | ICD-10-CM | POA: Insufficient documentation

## 2020-03-25 LAB — CYTOLOGY - NON PAP

## 2020-03-25 NOTE — Telephone Encounter (Signed)
Called and spoke with pt and [name] who stated that pt had bruising at site where labwork was performed and pt was not too happy about this. Stated to them that pt can try warm compresses at injection site to see if that would help with the bruising and also stated that they could contact PCP for further recommendations if needed and they both verbalized understanding. Nothing further needed.

## 2020-03-26 ENCOUNTER — Encounter (HOSPITAL_COMMUNITY): Payer: Self-pay | Admitting: Internal Medicine

## 2020-03-26 ENCOUNTER — Telehealth: Payer: Self-pay | Admitting: Medical Oncology

## 2020-03-26 NOTE — Telephone Encounter (Signed)
Cough- Yesterday coughing spasm exhausted him . Today has pink tinged sputum .   I instructed son to get Delysm for pt and to let us know if it does not help with his cough.

## 2020-03-27 ENCOUNTER — Ambulatory Visit (HOSPITAL_COMMUNITY)
Admission: RE | Admit: 2020-03-27 | Discharge: 2020-03-27 | Disposition: A | Discharge status: Home / Self Care | Payer: Medicare PPO | Source: Ambulatory Visit | Attending: Internal Medicine | Admitting: Internal Medicine

## 2020-03-27 ENCOUNTER — Other Ambulatory Visit: Payer: Self-pay

## 2020-03-27 DIAGNOSIS — C3490 Malignant neoplasm of unspecified part of unspecified bronchus or lung: Secondary | ICD-10-CM | POA: Diagnosis not present

## 2020-03-27 DIAGNOSIS — C7931 Secondary malignant neoplasm of brain: Secondary | ICD-10-CM | POA: Diagnosis not present

## 2020-03-27 DIAGNOSIS — G9389 Other specified disorders of brain: Secondary | ICD-10-CM | POA: Diagnosis not present

## 2020-03-27 IMAGING — MR MR HEAD WO/W CM
14 series · 48 of 48 positions shown · IV contrast (gadavist)
Comparison: Head CT [DATE]

CLINICAL DATA: Staging non-small cell lung cancer.

EXAM:
MRI HEAD WITHOUT AND WITH CONTRAST
TECHNIQUE: Multiplanar, multiecho pulse sequences of the brain and surrounding
structures were obtained without and with intravenous contrast.
CONTRAST:  7mL GADAVIST GADOBUTROL 1 MMOL/ML IV SOLN

[Series 5: DWI · axial · 3.0mm · 1.36mm/px · z∈[-29,+108]mm · 5 of 100 slices shown (1 of 4)]
[im 1/100]
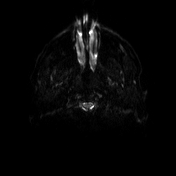
[im 25/100]
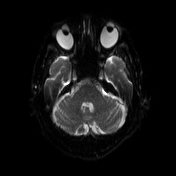
[im 50/100]
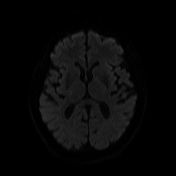
[im 75/100]
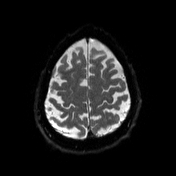
[im 100/100]
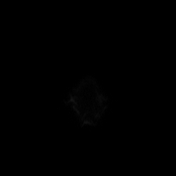

[Series 6: DWI · axial · 3.0mm · 1.36mm/px · z∈[-29,+108]mm · 3 of 50 slices shown (2 of 4)]
[im 1/50]
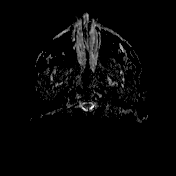
[im 25/50]
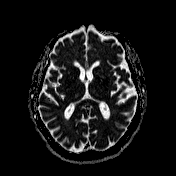
[im 50/50]
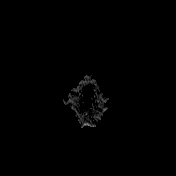

[Series 7: T1 · sagittal · 5.0mm · 0.75mm/px · 1 of 25 slices shown (1 of 2)]
[im 1/25]
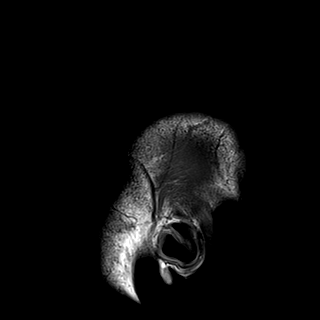

[Series 8: T2 · axial · 5.0mm · 0.62mm/px · 1 of 25 slices shown]
[im 1/25]
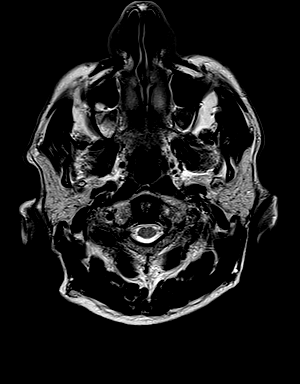

[Series 9: mip_images(sw) · axial · 24.0mm · 0.75mm/px · z∈[-24,+100]mm · 3 of 45 slices shown]
[im 1/45]
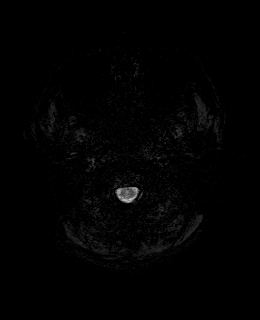
[im 23/45]
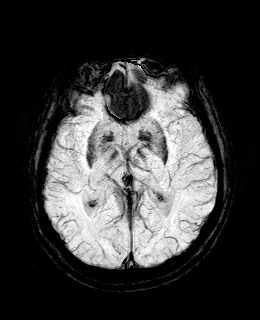
[im 45/45]
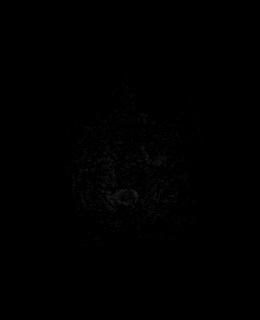

[Series 10: swi_images · axial · 3.0mm · 0.75mm/px · z∈[-34,+110]mm · 3 of 52 slices shown]
[im 1/52]
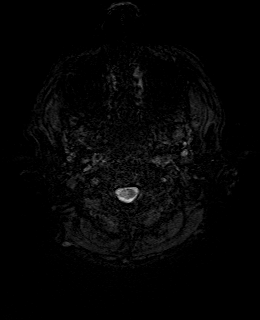
[im 26/52]
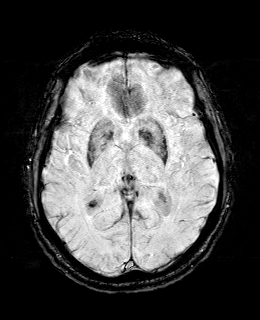
[im 52/52]
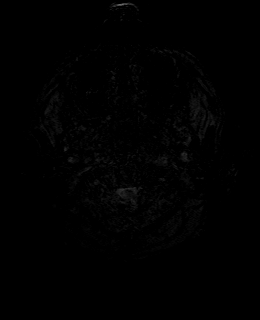

[Series 11: FLAIR · axial · 3.0mm · 0.75mm/px · z∈[-33,+110]mm · 3 of 52 slices shown]
[im 1/52]
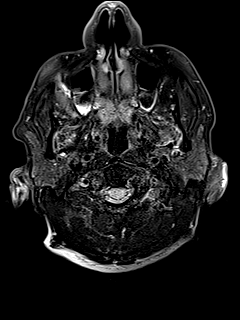
[im 26/52]
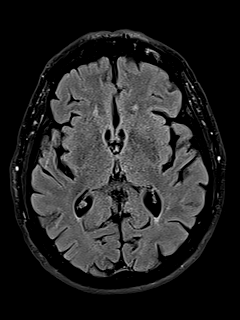
[im 52/52]
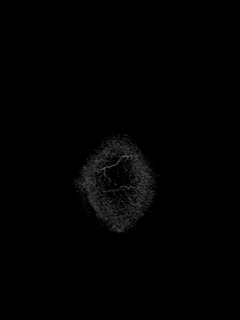

[Series 12: T1 · axial · 1.0mm · 0.94mm/px · z∈[-37,+112]mm · 9 of 160 slices shown (2 of 2)]
[im 1/160]
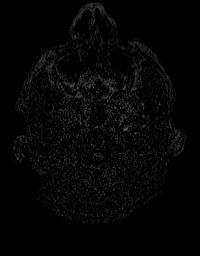
[im 20/160]
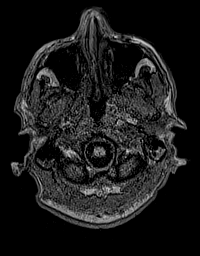
[im 40/160]
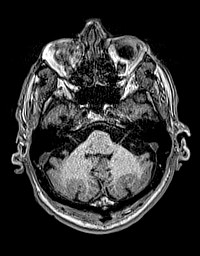
[im 60/160]
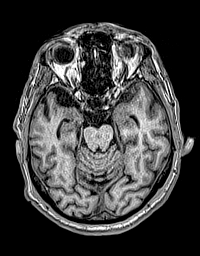
[im 80/160]
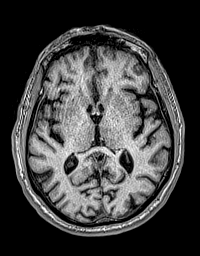
[im 100/160]
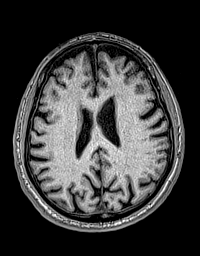
[im 120/160]
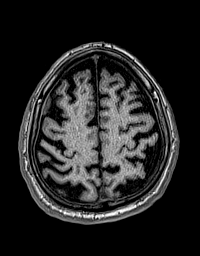
[im 140/160]
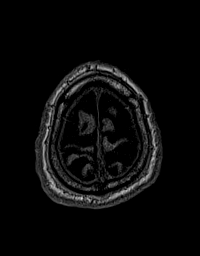
[im 160/160]
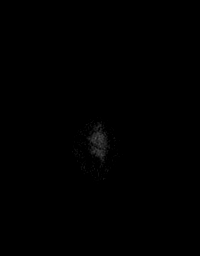

[Series 13: DWI · coronal · 5.0mm · 1.31mm/px · 4 of 72 slices shown (3 of 4)]
[im 1/72]
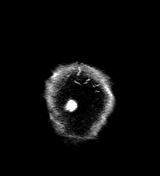
[im 24/72]
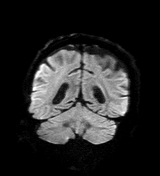
[im 48/72]
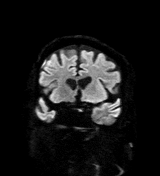
[im 72/72]
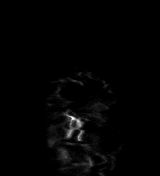

[Series 14: DWI · coronal · 5.0mm · 1.31mm/px · 2 of 36 slices shown (4 of 4)]
[im 1/36]
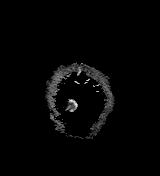
[im 36/36]
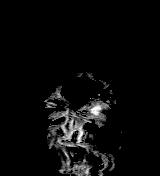

[Series 19: T2 post-contrast · coronal · 5.0mm · 0.57mm/px · 2 of 29 slices shown]
[im 1/29]
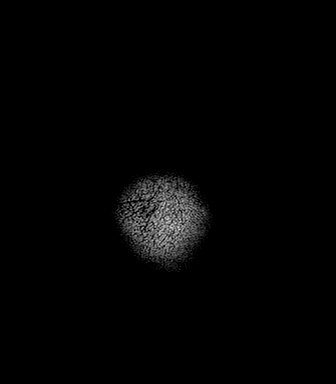
[im 29/29]
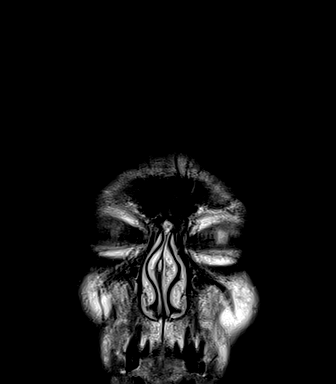

[Series 20: T1 post-contrast · axial · 1.0mm · 0.94mm/px · z∈[+11,+160]mm · 9 of 160 slices shown (1 of 3)]
[im 1/160]
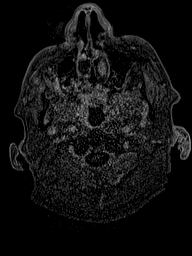
[im 20/160]
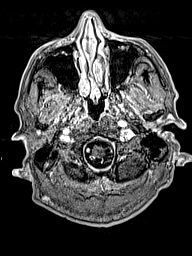
[im 40/160]
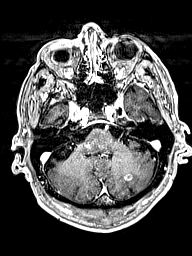
[im 60/160]
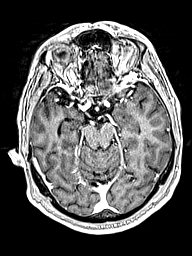
[im 80/160]
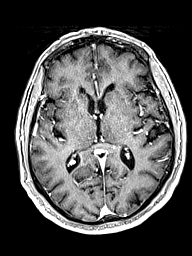
[im 100/160]
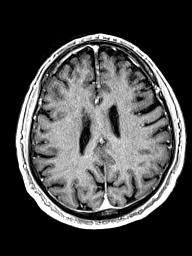
[im 120/160]
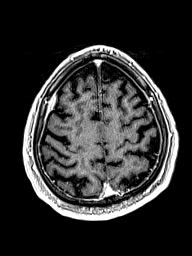
[im 140/160]
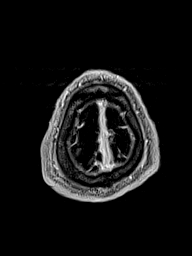
[im 160/160]
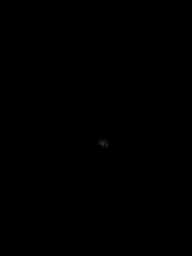

[Series 21: T1 post-contrast · sagittal · 5.0mm · 0.75mm/px · 1 of 25 slices shown (2 of 3)]
[im 1/25]
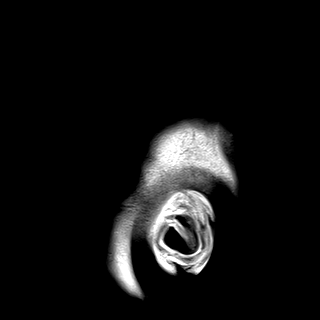

[Series 22: T1 post-contrast · coronal · 5.0mm · 0.43mm/px · 2 of 29 slices shown (3 of 3)]
[im 1/29]
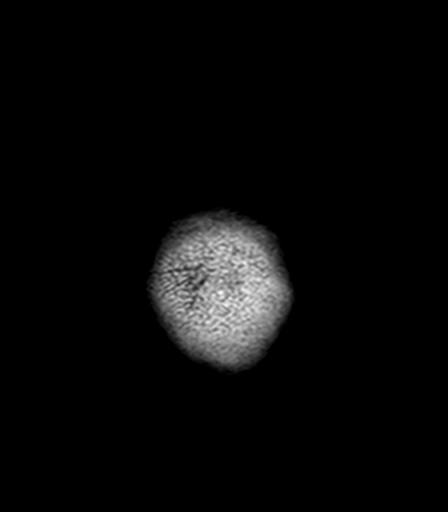
[im 29/29]
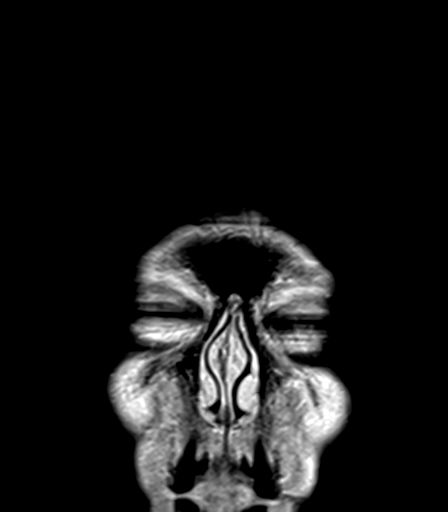

[48 of 48 positions shown; findings below may reference images not displayed]

FINDINGS: Brain: Diffusion imaging does not show any acute or subacute
infarction or other cause of restricted diffusion.

3 mm metastasis right cerebellum axial image 49. 3.5 mm metastasis
left cerebellum axial image 33. 8 mm metastasis left cerebellum
axial image 39. Possible punctate metastasis posterior left
cerebellum axial image 41. Punctate metastasis left vermis axial
image 44.

No metastases identified in either cerebral hemisphere.

Meningioma at the right frontal lateral convexity with a diameter of
2 cm and a thickness of 9 mm. No mass-effect upon the brain.

Minimal small vessel change of the hemispheric white matter. No
cortical or large vessel territory infarction. No hydrocephalus or
extra-axial collection.

Vascular: Major vessels at the base of the brain show flow.

Skull and upper cervical spine: Negative

Sinuses/Orbits: Clear/normal

Other: None
IMPRESSION: Five cerebellar metastases identified as described above. The
largest is in the left cerebellum measuring 8 mm in diameter. No
mass effect or hemorrhage.

No metastases identified in either cerebral hemisphere.

Incidental meningioma at the right lateral frontal convexity with a
diameter of 2 cm and a thickness of 9 mm. No mass-effect upon the
brain.

## 2020-03-27 MED ORDER — GADOBUTROL 1 MMOL/ML IV SOLN
7.0000 mL | Freq: Once | INTRAVENOUS | Status: AC | PRN
  Administered 2020-03-27: 7 mL via INTRAVENOUS

## 2020-03-30 ENCOUNTER — Encounter: Payer: Self-pay | Admitting: Internal Medicine

## 2020-03-30 ENCOUNTER — Inpatient Hospital Stay: Payer: Medicare PPO | Admitting: Internal Medicine

## 2020-03-30 ENCOUNTER — Telehealth: Payer: Self-pay | Admitting: Internal Medicine

## 2020-03-30 ENCOUNTER — Other Ambulatory Visit: Payer: Self-pay | Admitting: Medical Oncology

## 2020-03-30 ENCOUNTER — Other Ambulatory Visit: Payer: Self-pay

## 2020-03-30 VITALS — BP 146/69 | HR 107 | Temp 99.3°F | Resp 19 | Ht 68.0 in | Wt 159.5 lb

## 2020-03-30 DIAGNOSIS — E039 Hypothyroidism, unspecified: Secondary | ICD-10-CM

## 2020-03-30 DIAGNOSIS — C7951 Secondary malignant neoplasm of bone: Secondary | ICD-10-CM | POA: Diagnosis not present

## 2020-03-30 DIAGNOSIS — Z5112 Encounter for antineoplastic immunotherapy: Secondary | ICD-10-CM

## 2020-03-30 DIAGNOSIS — Z7189 Other specified counseling: Secondary | ICD-10-CM

## 2020-03-30 DIAGNOSIS — C7931 Secondary malignant neoplasm of brain: Secondary | ICD-10-CM | POA: Diagnosis not present

## 2020-03-30 DIAGNOSIS — Z5111 Encounter for antineoplastic chemotherapy: Secondary | ICD-10-CM

## 2020-03-30 DIAGNOSIS — C3490 Malignant neoplasm of unspecified part of unspecified bronchus or lung: Secondary | ICD-10-CM | POA: Diagnosis not present

## 2020-03-30 DIAGNOSIS — R05 Cough: Secondary | ICD-10-CM | POA: Diagnosis not present

## 2020-03-30 DIAGNOSIS — C3492 Malignant neoplasm of unspecified part of left bronchus or lung: Secondary | ICD-10-CM | POA: Diagnosis not present

## 2020-03-30 DIAGNOSIS — R634 Abnormal weight loss: Secondary | ICD-10-CM

## 2020-03-30 DIAGNOSIS — Z87891 Personal history of nicotine dependence: Secondary | ICD-10-CM | POA: Diagnosis not present

## 2020-03-30 MED ORDER — DRONABINOL 2.5 MG PO CAPS
2.5000 mg | ORAL_CAPSULE | Freq: Two times a day (BID) | ORAL | 0 refills | Status: DC
Start: 2020-03-30 — End: 2020-04-27

## 2020-03-30 MED ORDER — PROCHLORPERAZINE MALEATE 10 MG PO TABS
10.0000 mg | ORAL_TABLET | Freq: Four times a day (QID) | ORAL | 0 refills | Status: DC | PRN
Start: 2020-03-30 — End: 2021-01-29

## 2020-03-30 MED ORDER — BENZONATATE 200 MG PO CAPS
200.0000 mg | ORAL_CAPSULE | Freq: Three times a day (TID) | ORAL | 0 refills | Status: DC | PRN
Start: 2020-03-30 — End: 2020-04-07

## 2020-03-30 MED ORDER — CYANOCOBALAMIN 1000 MCG/ML IJ SOLN
1000.0000 ug | Freq: Once | INTRAMUSCULAR | Status: AC
  Administered 2020-03-30: 1000 ug via INTRAMUSCULAR

## 2020-03-30 MED ORDER — CYANOCOBALAMIN 1000 MCG/ML IJ SOLN
INTRAMUSCULAR | Status: AC
  Filled 2020-03-30: qty 1

## 2020-03-30 MED ORDER — FOLIC ACID 1 MG PO TABS
1.0000 mg | ORAL_TABLET | Freq: Every day | ORAL | 4 refills | Status: DC
Start: 2020-03-30 — End: 2020-07-26

## 2020-03-30 NOTE — Patient Instructions (Signed)
Pembrolizumab injection What is this medicine? PEMBROLIZUMAB (pem broe liz ue mab) is a monoclonal antibody. It is used to treat certain types of cancer. This medicine may be used for other purposes; ask your health care provider or pharmacist if you have questions. COMMON BRAND NAME(S): Keytruda What should I tell my health care provider before I take this medicine? They need to know if you have any of these conditions:  diabetes  immune system problems  inflammatory bowel disease  liver disease  lung or breathing disease  lupus  received or scheduled to receive an organ transplant or a stem-cell transplant that uses donor stem cells  an unusual or allergic reaction to pembrolizumab, other medicines, foods, dyes, or preservatives  pregnant or trying to get pregnant  breast-feeding How should I use this medicine? This medicine is for infusion into a vein. It is given by a health care professional in a hospital or clinic setting. A special MedGuide will be given to you before each treatment. Be sure to read this information carefully each time. Talk to your pediatrician regarding the use of this medicine in children. While this drug may be prescribed for children as young as 6 months for selected conditions, precautions do apply. Overdosage: If you think you have taken too much of this medicine contact a poison control center or emergency room at once. NOTE: This medicine is only for you. Do not share this medicine with others. What if I miss a dose? It is important not to miss your dose. Call your doctor or health care professional if you are unable to keep an appointment. What may interact with this medicine? Interactions have not been studied. Give your health care provider a list of all the medicines, herbs, non-prescription drugs, or dietary supplements you use. Also tell them if you smoke, drink alcohol, or use illegal drugs. Some items may interact with your medicine. This  list may not describe all possible interactions. Give your health care provider a list of all the medicines, herbs, non-prescription drugs, or dietary supplements you use. Also tell them if you smoke, drink alcohol, or use illegal drugs. Some items may interact with your medicine. What should I watch for while using this medicine? Your condition will be monitored carefully while you are receiving this medicine. You may need blood work done while you are taking this medicine. Do not become pregnant while taking this medicine or for 4 months after stopping it. Women should inform their doctor if they wish to become pregnant or think they might be pregnant. There is a potential for serious side effects to an unborn child. Talk to your health care professional or pharmacist for more information. Do not breast-feed an infant while taking this medicine or for 4 months after the last dose. What side effects may I notice from receiving this medicine? Side effects that you should report to your doctor or health care professional as soon as possible:  allergic reactions like skin rash, itching or hives, swelling of the face, lips, or tongue  bloody or black, tarry  breathing problems  changes in vision  chest pain  chills  confusion  constipation  cough  diarrhea  dizziness or feeling faint or lightheaded  fast or irregular heartbeat  fever  flushing  joint pain  low blood counts - this medicine may decrease the number of white blood cells, red blood cells and platelets. You may be at increased risk for infections and bleeding.  muscle pain  muscle  weakness  pain, tingling, numbness in the hands or feet  persistent headache  redness, blistering, peeling or loosening of the skin, including inside the mouth  signs and symptoms of high blood sugar such as dizziness; dry mouth; dry skin; fruity breath; nausea; stomach pain; increased hunger or thirst; increased urination  signs  and symptoms of kidney injury like trouble passing urine or change in the amount of urine  signs and symptoms of liver injury like dark urine, light-colored stools, loss of appetite, nausea, right upper belly pain, yellowing of the eyes or skin  sweating  swollen lymph nodes  weight loss Side effects that usually do not require medical attention (report to your doctor or health care professional if they continue or are bothersome):  decreased appetite  hair loss  muscle pain  tiredness This list may not describe all possible side effects. Call your doctor for medical advice about side effects. You may report side effects to FDA at 1-800-FDA-1088. Where should I keep my medicine? This drug is given in a hospital or clinic and will not be stored at home. NOTE: This sheet is a summary. It may not cover all possible information. If you have questions about this medicine, talk to your doctor, pharmacist, or health care provider.  2020 Elsevier/Gold Standard (2019-07-12 18:07:58) Pemetrexed injection What is this medicine? PEMETREXED (PEM e TREX ed) is a chemotherapy drug used to treat lung cancers like non-small cell lung cancer and mesothelioma. It may also be used to treat other cancers. This medicine may be used for other purposes; ask your health care provider or pharmacist if you have questions. COMMON BRAND NAME(S): Alimta What should I tell my health care provider before I take this medicine? They need to know if you have any of these conditions:  infection (especially a virus infection such as chickenpox, cold sores, or herpes)  kidney disease  low blood counts, like low white cell, platelet, or red cell counts  lung or breathing disease, like asthma  radiation therapy  an unusual or allergic reaction to pemetrexed, other medicines, foods, dyes, or preservative  pregnant or trying to get pregnant  breast-feeding How should I use this medicine? This drug is given as  an infusion into a vein. It is administered in a hospital or clinic by a specially trained health care professional. Talk to your pediatrician regarding the use of this medicine in children. Special care may be needed. Overdosage: If you think you have taken too much of this medicine contact a poison control center or emergency room at once. NOTE: This medicine is only for you. Do not share this medicine with others. What if I miss a dose? It is important not to miss your dose. Call your doctor or health care professional if you are unable to keep an appointment. What may interact with this medicine? This medicine may interact with the following medications:  Ibuprofen This list may not describe all possible interactions. Give your health care provider a list of all the medicines, herbs, non-prescription drugs, or dietary supplements you use. Also tell them if you smoke, drink alcohol, or use illegal drugs. Some items may interact with your medicine. What should I watch for while using this medicine? Visit your doctor for checks on your progress. This drug may make you feel generally unwell. This is not uncommon, as chemotherapy can affect healthy cells as well as cancer cells. Report any side effects. Continue your course of treatment even though you feel ill unless  your doctor tells you to stop. In some cases, you may be given additional medicines to help with side effects. Follow all directions for their use. Call your doctor or health care professional for advice if you get a fever, chills or sore throat, or other symptoms of a cold or flu. Do not treat yourself. This drug decreases your body's ability to fight infections. Try to avoid being around people who are sick. This medicine may increase your risk to bruise or bleed. Call your doctor or health care professional if you notice any unusual bleeding. Be careful brushing and flossing your teeth or using a toothpick because you may get an  infection or bleed more easily. If you have any dental work done, tell your dentist you are receiving this medicine. Avoid taking products that contain aspirin, acetaminophen, ibuprofen, naproxen, or ketoprofen unless instructed by your doctor. These medicines may hide a fever. Call your doctor or health care professional if you get diarrhea or mouth sores. Do not treat yourself. To protect your kidneys, drink water or other fluids as directed while you are taking this medicine. Do not become pregnant while taking this medicine or for 6 months after stopping it. Women should inform their doctor if they wish to become pregnant or think they might be pregnant. Men should not father a child while taking this medicine and for 3 months after stopping it. This may interfere with the ability to father a child. You should talk to your doctor or health care professional if you are concerned about your fertility. There is a potential for serious side effects to an unborn child. Talk to your health care professional or pharmacist for more information. Do not breast-feed an infant while taking this medicine or for 1 week after stopping it. What side effects may I notice from receiving this medicine? Side effects that you should report to your doctor or health care professional as soon as possible:  allergic reactions like skin rash, itching or hives, swelling of the face, lips, or tongue  breathing problems  redness, blistering, peeling or loosening of the skin, including inside the mouth  signs and symptoms of bleeding such as bloody or black, tarry stools; red or dark-brown urine; spitting up blood or brown material that looks like coffee grounds; red spots on the skin; unusual bruising or bleeding from the eye, gums, or nose  signs and symptoms of infection like fever or chills; cough; sore throat; pain or trouble passing urine  signs and symptoms of kidney injury like trouble passing urine or change in the  amount of urine  signs and symptoms of liver injury like dark yellow or brown urine; general ill feeling or flu-like symptoms; light-colored stools; loss of appetite; nausea; right upper belly pain; unusually weak or tired; yellowing of the eyes or skin Side effects that usually do not require medical attention (report to your doctor or health care professional if they continue or are bothersome):  constipation  mouth sores  nausea, vomiting  unusually weak or tired This list may not describe all possible side effects. Call your doctor for medical advice about side effects. You may report side effects to FDA at 1-800-FDA-1088. Where should I keep my medicine? This drug is given in a hospital or clinic and will not be stored at home. NOTE: This sheet is a summary. It may not cover all possible information. If you have questions about this medicine, talk to your doctor, pharmacist, or health care provider.  2020  Elsevier/Gold Standard (2017-10-25 16:11:33) Carboplatin injection What is this medicine? CARBOPLATIN (KAR boe pla tin) is a chemotherapy drug. It targets fast dividing cells, like cancer cells, and causes these cells to die. This medicine is used to treat ovarian cancer and many other cancers. This medicine may be used for other purposes; ask your health care provider or pharmacist if you have questions. COMMON BRAND NAME(S): Paraplatin What should I tell my health care provider before I take this medicine? They need to know if you have any of these conditions:  blood disorders  hearing problems  kidney disease  recent or ongoing radiation therapy  an unusual or allergic reaction to carboplatin, cisplatin, other chemotherapy, other medicines, foods, dyes, or preservatives  pregnant or trying to get pregnant  breast-feeding How should I use this medicine? This drug is usually given as an infusion into a vein. It is administered in a hospital or clinic by a specially  trained health care professional. Talk to your pediatrician regarding the use of this medicine in children. Special care may be needed. Overdosage: If you think you have taken too much of this medicine contact a poison control center or emergency room at once. NOTE: This medicine is only for you. Do not share this medicine with others. What if I miss a dose? It is important not to miss a dose. Call your doctor or health care professional if you are unable to keep an appointment. What may interact with this medicine?  medicines for seizures  medicines to increase blood counts like filgrastim, pegfilgrastim, sargramostim  some antibiotics like amikacin, gentamicin, neomycin, streptomycin, tobramycin  vaccines Talk to your doctor or health care professional before taking any of these medicines:  acetaminophen  aspirin  ibuprofen  ketoprofen  naproxen This list may not describe all possible interactions. Give your health care provider a list of all the medicines, herbs, non-prescription drugs, or dietary supplements you use. Also tell them if you smoke, drink alcohol, or use illegal drugs. Some items may interact with your medicine. What should I watch for while using this medicine? Your condition will be monitored carefully while you are receiving this medicine. You will need important blood work done while you are taking this medicine. This drug may make you feel generally unwell. This is not uncommon, as chemotherapy can affect healthy cells as well as cancer cells. Report any side effects. Continue your course of treatment even though you feel ill unless your doctor tells you to stop. In some cases, you may be given additional medicines to help with side effects. Follow all directions for their use. Call your doctor or health care professional for advice if you get a fever, chills or sore throat, or other symptoms of a cold or flu. Do not treat yourself. This drug decreases your body's  ability to fight infections. Try to avoid being around people who are sick. This medicine may increase your risk to bruise or bleed. Call your doctor or health care professional if you notice any unusual bleeding. Be careful brushing and flossing your teeth or using a toothpick because you may get an infection or bleed more easily. If you have any dental work done, tell your dentist you are receiving this medicine. Avoid taking products that contain aspirin, acetaminophen, ibuprofen, naproxen, or ketoprofen unless instructed by your doctor. These medicines may hide a fever. Do not become pregnant while taking this medicine. Women should inform their doctor if they wish to become pregnant or think they might  be pregnant. There is a potential for serious side effects to an unborn child. Talk to your health care professional or pharmacist for more information. Do not breast-feed an infant while taking this medicine. What side effects may I notice from receiving this medicine? Side effects that you should report to your doctor or health care professional as soon as possible:  allergic reactions like skin rash, itching or hives, swelling of the face, lips, or tongue  signs of infection - fever or chills, cough, sore throat, pain or difficulty passing urine  signs of decreased platelets or bleeding - bruising, pinpoint red spots on the skin, black, tarry stools, nosebleeds  signs of decreased red blood cells - unusually weak or tired, fainting spells, lightheadedness  breathing problems  changes in hearing  changes in vision  chest pain  high blood pressure  low blood counts - This drug may decrease the number of white blood cells, red blood cells and platelets. You may be at increased risk for infections and bleeding.  nausea and vomiting  pain, swelling, redness or irritation at the injection site  pain, tingling, numbness in the hands or feet  problems with balance, talking,  walking  trouble passing urine or change in the amount of urine Side effects that usually do not require medical attention (report to your doctor or health care professional if they continue or are bothersome):  hair loss  loss of appetite  metallic taste in the mouth or changes in taste This list may not describe all possible side effects. Call your doctor for medical advice about side effects. You may report side effects to FDA at 1-800-FDA-1088. Where should I keep my medicine? This drug is given in a hospital or clinic and will not be stored at home. NOTE: This sheet is a summary. It may not cover all possible information. If you have questions about this medicine, talk to your doctor, pharmacist, or health care provider.  2020 Elsevier/Gold Standard (2007-12-11 14:38:05) Lung Cancer Lung cancer is an abnormal growth of cancerous cells that forms a mass (malignant tumor) in a lung. There are several types of lung cancer. The types are based on the appearance of the tumor cells. The two most common types are:  Non-small cell lung cancer. This type of lung cancer is the most common type. Non-small cell lung cancers include squamous cell carcinoma, adenocarcinoma, and large cell carcinoma.  Small cell lung cancer. In this type of lung cancer, abnormal cells are smaller than those of non-small cell lung cancer. Small cell lung cancer gets worse (progresses) faster than non-small cell lung cancer. What are the causes? The most common cause of lung cancer is smoking tobacco. The second most common cause is exposure to a chemical called radon. What increases the risk? You are more likely to develop this condition if:  You smoke tobacco.  You have been exposed to: ? Secondhand tobacco smoke. ? Radon gas. ? Uranium. ? Asbestos. ? Arsenic in drinking water. ? Air pollution.  You have a family or personal history of lung cancer.  You have had lung radiation therapy in the past.  You  are older than age 55. What are the signs or symptoms? In the early stages, you may not have any symptoms. As the cancer progresses, symptoms may include:  A lasting cough, possibly with blood.  Fatigue.  Unexplained weight loss.  Shortness of breath.  Loud breathing (wheezing).  Chest pain.  Loss of appetite. Symptoms of advanced lung cancer  include:  Hoarseness.  Bone or joint pain.  Weakness.  Change in the structure of the fingernails (clubbing), so that the nail looks like an upside-down spoon.  Swelling of the face or arms.  Inability to move the face (paralysis).  Drooping eyelids. How is this diagnosed? This condition may be diagnosed based on:  Your symptoms and medical history.  A physical exam.  A chest X-ray.  A CT scan.  Blood tests.  Sputum tests.  Removal of a sample of lung tissue (lung biopsy) for testing. Your cancer will be assessed (staged) to determine how severe it is and how much it has spread (metastasized). How is this treated? Treatment depends on the type and stage of your cancer. Treatment may include one or more of the following:  Surgery to remove as much of the cancer as possible. Lymph nodes in the area may be removed and tested for cancer as well.  Medicines that kill cancer cells (chemotherapy).  High-energy rays that kill cancer cells (radiation therapy).  Chemotherapy. This treatment uses medicines to destroy cancer cells.  Targeted therapy. This targets specific parts of cancer cells and the area around them to block the growth and spread of the cancer. Targeted therapy can help limit the damage to healthy cells. Follow these instructions at home: Eating and drinking  Some of your treatments might affect your appetite. If you are having problems eating, or if you do not have an appetite, meet with a dietitian.  If you have side effects that affect your appetite, it may help to: ? Eat smaller meals and snacks  often. ? Drink high-nutrition and high-calorie shakes or supplements. ? Eat bland and soft foods that are easy to eat. ? Avoid eating foods that are hot, spicy, or hard to swallow. General instructions   Do not use any products that contain nicotine or tobacco, such as cigarettes and e-cigarettes. If you need help quitting, ask your health care provider.  Do not drink alcohol.  If you are admitted to the hospital, make sure your cancer specialist (oncologist) is aware. Your cancer may affect your treatment for other conditions.  Take over-the-counter and prescription medicines only as told by your health care provider.  Consider joining a support group for people who have been diagnosed with lung cancer.  Work with your health care provider to manage any side effects of treatment.  Keep all follow-up visits as told by your health care provider. This is important. Where to find more information  American Cancer Society: https://www.cancer.[place]): https://www.cancer.gov Contact a health care provider if you:  Lose weight without trying.  Have a persistent cough and wheezing.  Feel short of breath.  Get tired easily.  Have bone or joint pain.  Have difficulty swallowing.  Notice that your voice is changing or getting hoarse.  Have pain that does not get better with medicine. Get help right away if you:  Cough up blood.  Have new breathing problems.  Have chest pain.  Have a fever.  Have swelling in an ankle, leg, or arm, or the face or neck.  Have paralysis in your face.  Are very confused.  Have a drooping eyelid. Summary  Lung cancer is an abnormal growth of cancerous cells that forms a mass (malignant tumor) in a lung.  There are several types of lung cancer. The types are based on the appearance of the tumor cells. The two most common types are non-small cell and small cell.  The most common cause of lung cancer is  smoking tobacco.  Early symptoms include a lasting cough, possibly with blood, and fatigue, unexplained weight loss, and shortness of breath.  After diagnosis, treatment depends on the type and stage of your cancer. This information is not intended to replace advice given to you by your health care provider. Make sure you discuss any questions you have with your health care provider. Document Revised: 08/18/2017 Document Reviewed: 07/13/2017 Elsevier Patient Education  2020 [employer].

## 2020-03-30 NOTE — Telephone Encounter (Signed)
Scheduled appt per 7/12 los -gave patient avs and calender

## 2020-03-30 NOTE — Progress Notes (Signed)
Received referral from RN regarding prescription assistance.  Called and spoke with patient's spouse and introduced myself as Arboriculturist.  Listened to her concerns. Discussed one-time $1000 Radio broadcast assistant to assist with medication and personal expenses while going through treatment. Based on income guidelines, patient verbally states they are over income for the grant.  Advised available assistance would depend on specific drugs and treatment plan. There may be assistance available through drug assistance. I have reached out to [email or web address]  to inquire and review per plan in epic.  Advised if oral chemo is given which she mentioned, Benjamine Mola in oral chemo clinic would handle. She states they are waiting on testing to determine what will be given.  Gave her my direct name and number for any additional financial questions or concerns.

## 2020-03-30 NOTE — Progress Notes (Signed)
[place] [phone]   [phone]  OFFICE PROGRESS NOTE  Seward Carol, MD [address] [place] [address]  DIAGNOSIS: Stage IV (T4, N3, M1c) non-small cell lung cancer, adenocarcinoma presented with left upper lobe lung mass in addition to bilateral mediastinal lymphadenopathy as well as bilateral pulmonary nodules and metastatic disease to the bones and brain diagnosed in July 2021.  PRIOR THERAPY: None  CURRENT THERAPY: Systemic chemotherapy with carboplatin for AUC of 5, Alimta 500 mg/M2 and Keytruda 200 mg IV every 3 weeks.  First dose 04/06/2020.  The treatment will start as planned next week after the molecular study shows no actionable mutations.  INTERVAL HISTORY: Phillip Franklin 79 y.o. male returns to the clinic today for follow-up visit accompanied by his wife.  His daughter [name] who is a primary care physician was on FaceTime during the visit.  The patient is feeling fine today with no concerning complaints except for the bilateral rib cage pain after cough.  He denied having any shortness of breath or hemoptysis.  He lost around 5 pounds since his last visit secondary to poor appetite and oral intake.  He denied having any headache or visual changes.  He has no nausea, vomiting, diarrhea but has intermittent constipation.  The patient has no fever or chills.  He underwent several studies since his last visit including a PET scan, MRI brain as well as bronchoscopy with endobronchial ultrasound and biopsy.  The final pathology was consistent with adenocarcinoma.  The patient is here today for evaluation and discussion of his treatment options.  MEDICAL HISTORY: Past Medical History:  Diagnosis Date  . Depression   . GERD (gastroesophageal reflux disease)    occasional, diet controlled  . High cholesterol   . Hypertension    no longer taking medication - states it's undercontrol  . Hypothyroid     ALLERGIES:  is  allergic to codeine, hydrocodone, oxycodone, and penicillins.  MEDICATIONS:  Current Outpatient Medications  Medication Sig Dispense Refill  . acetaminophen (TYLENOL) 325 MG tablet Take 325-650 mg by mouth every 6 (six) hours as needed for moderate pain or headache.    . Ascorbic Acid (VITAMIN C ADULT GUMMIES PO) Take 500-1,000 mg by mouth daily.    Marland Kitchen aspirin 81 MG tablet Take 81 mg by mouth every other day.     Marland Kitchen atorvastatin (LIPITOR) 20 MG tablet Take 20 mg by mouth daily.    . Biotin (BIOTIN 5000) 5 MG CAPS Take 5 mg by mouth every evening.    . Camphor-Eucalyptus-Menthol (VICKS VAPORUB EX) Apply 1 application topically daily as needed (congestion).    . Cholecalciferol (DIALYVITE VITAMIN D 5000) 125 MCG (5000 UT) capsule Take 5,000 Units by mouth daily.    . Coenzyme Q10 (COQ-10) 100 MG CAPS Take 100 mg by mouth every evening.    Marland Kitchen esomeprazole (NEXIUM) 20 MG capsule Take 20 mg by mouth daily as needed (acid reflux).    Marland Kitchen levothyroxine (SYNTHROID) 88 MCG tablet Take 88 mcg by mouth daily before breakfast.    . LUTEIN PO Take 25 mg by mouth every evening.    . Magnesium 400 MG CAPS Take 400 mg by mouth every evening.    . Menthol, Topical Analgesic, (BENGAY EX) Apply 1 application topically daily as needed (pain).    . Menthol-Camphor (SOMBRA NATURAL PAIN RELIEVING EX) Apply 1 application topically daily as needed (pain).    . mirtazapine (REMERON) 30  MG tablet Take 1 tablet (30 mg total) by mouth at bedtime. 30 tablet 2  . OVER THE COUNTER MEDICATION Take 15 mLs by mouth at bedtime. Colsys oral rinse    . polyethylene glycol (MIRALAX) packet Take 17 g by mouth daily. (Patient taking differently: Take 17 g by mouth daily as needed for mild constipation. ) 14 each 0  . Saw Palmetto, Serenoa repens, (SAW PALMETTO PO) Take 1 capsule by mouth every evening.    . tamsulosin (FLOMAX) 0.4 MG CAPS capsule Take 0.4 mg by mouth every evening.      No current facility-administered medications for  this visit.    SURGICAL HISTORY:  Past Surgical History:  Procedure Laterality Date  . BRONCHIAL NEEDLE ASPIRATION BIOPSY  03/24/2020   Procedure: BRONCHIAL NEEDLE ASPIRATION BIOPSIES;  Surgeon: Candee Furbish, MD;  Location: [facility] ENDOSCOPY;  Service: Pulmonary;;  . HEMORRHOID SURGERY    . VIDEO BRONCHOSCOPY WITH ENDOBRONCHIAL ULTRASOUND N/A 03/24/2020   Procedure: VIDEO BRONCHOSCOPY WITH ENDOBRONCHIAL ULTRASOUND;  Surgeon: Candee Furbish, MD;  Location: [facility] ENDOSCOPY;  Service: Pulmonary;  Laterality: N/A;    REVIEW OF SYSTEMS:  Constitutional: positive for anorexia, fatigue and weight loss Eyes: negative Ears, nose, mouth, throat, and face: negative Respiratory: positive for cough and pleurisy/chest pain Cardiovascular: negative Gastrointestinal: positive for constipation Genitourinary:negative Integument/breast: negative Hematologic/lymphatic: negative Musculoskeletal:negative Neurological: negative Behavioral/Psych: negative Endocrine: negative Allergic/Immunologic: negative   PHYSICAL EXAMINATION: General appearance: alert, cooperative, fatigued and no distress Head: Normocephalic, without obvious abnormality, atraumatic Neck: no adenopathy, no JVD, supple, symmetrical, trachea midline and thyroid not enlarged, symmetric, no tenderness/mass/nodules Lymph nodes: Cervical, supraclavicular, and axillary nodes normal. Resp: clear to auscultation bilaterally Back: symmetric, no curvature. ROM normal. No CVA tenderness. Cardio: regular rate and rhythm, S1, S2 normal, no murmur, click, rub or gallop GI: soft, non-tender; bowel sounds normal; no masses,  no organomegaly Extremities: extremities normal, atraumatic, no cyanosis or edema Neurologic: Alert and oriented X 3, normal strength and tone. Normal symmetric reflexes. Normal coordination and gait  ECOG PERFORMANCE STATUS: 1 - Symptomatic but completely ambulatory  Blood pressure (!) 146/69, pulse (!) 107, temperature 99.3 F  (37.4 C), temperature source Temporal, resp. rate 19, height 5\' 8"  (1.727 m), weight 159 lb 8 oz (72.3 kg), SpO2 97 %.  LABORATORY DATA: Lab Results  Component Value Date   WBC 11.1 (H) 03/13/2020   HGB 14.0 03/13/2020   HCT 44.1 03/13/2020   MCV 90.4 03/13/2020   PLT 288 03/13/2020      Chemistry      Component Value Date/Time   NA 135 03/13/2020 0810   K 4.3 03/13/2020 0810   CL 98 03/13/2020 0810   CO2 28 03/13/2020 0810   BUN 14 03/13/2020 0810   CREATININE 1.50 (H) 03/13/2020 0810      Component Value Date/Time   CALCIUM 10.1 03/13/2020 0810   ALKPHOS 124 03/13/2020 0810   AST 23 03/13/2020 0810   ALT 26 03/13/2020 0810   BILITOT 0.5 03/13/2020 0810       RADIOGRAPHIC STUDIES: CT CHEST W CONTRAST  Result Date: 03/06/2020 CLINICAL DATA:  79 year old male with 20 pounds of weight loss over the past 2-3 months. Cough. Former smoker (quit 42 years ago). Abnormal chest x-ray. EXAM: CT CHEST WITH CONTRAST TECHNIQUE: Multidetector CT imaging of the chest was performed during intravenous contrast administration. CONTRAST:  75mL ISOVUE-300 IOPAMIDOL (ISOVUE-300) INJECTION 61% COMPARISON:  CT of the chest 08/28/2011. FINDINGS: Cardiovascular: Heart size is normal. There is no significant  pericardial fluid, thickening or pericardial calcification. There is aortic atherosclerosis, as well as atherosclerosis of the great vessels of the mediastinum and the coronary arteries, including calcified atherosclerotic plaque in the left main, left anterior descending and left circumflex coronary arteries. Severe thickening calcification of the aortic valve. Calcifications of the inferior aspect of the mitral annulus. Mediastinum/Nodes: Numerous borderline enlarged and enlarged mediastinal and bilateral hilar lymph nodes, largest of which include a 2.3 cm short axis subcarinal nodal mass, and a 1.4 cm short axis left hilar lymph node. Esophagus is unremarkable in appearance. No axillary  lymphadenopathy. Lungs/Pleura: Large mass in the apex of the left upper lobe (axial image 21 of series 2 and sagittal image 114 of series 6) measuring 5.5 x 4.9 x 3.5 cm. This mass makes broad contact with the pleura in the apex of the left hemithorax. Extensive pleural thickening and nodularity in the upper left hemithorax indicative of widespread pleural metastatic disease. Innumerable smaller pulmonary nodules are noted throughout the lungs bilaterally, largest of which is in the periphery of the lateral segment of the right middle lobe (axial image 79 of series 8) measuring 2.3 x 2.4 cm. This largest lesion in the right middle lobe is surrounded by a halo of some ground-glass attenuation, which could indicate some perilesional hemorrhage. No right pleural effusion. Upper Abdomen: 7 mm fatty attenuation lesion in the medial limb of the right adrenal gland, compatible with a tiny right adrenal myelolipoma. Musculoskeletal: There are no aggressive appearing lytic or blastic lesions noted in the visualized portions of the skeleton. IMPRESSION: 1. Large mass in the apex of the left upper with evidence of widespread metastatic disease throughout the lungs bilaterally, as well as pleural metastases in the left hemithorax, and nodal disease in mediastinal and hilar regions bilaterally. Findings likely reflect a primary left upper lobe bronchogenic carcinoma with metastatic disease (stage IV disease), as detailed above. 2. Aortic atherosclerosis, in addition to left main and 2 vessel coronary artery disease. 3. There are calcifications of the aortic valve and mitral annulus. Echocardiographic correlation for evaluation of potential valvular dysfunction may be warranted if clinically indicated. 4. Tiny right adrenal myelolipoma incidentally noted. Aortic Atherosclerosis (ICD10-I70.0). Electronically Signed   By: Vinnie Langton M.D.   On: 03/06/2020 09:52   MR BRAIN W WO CONTRAST  Result Date: 03/29/2020 CLINICAL  DATA:  Staging non-small cell lung cancer. EXAM: MRI HEAD WITHOUT AND WITH CONTRAST TECHNIQUE: Multiplanar, multiecho pulse sequences of the brain and surrounding structures were obtained without and with intravenous contrast. CONTRAST:  21mL GADAVIST GADOBUTROL 1 MMOL/ML IV SOLN COMPARISON:  Head CT 08/29/2011 FINDINGS: Brain: Diffusion imaging does not show any acute or subacute infarction or other cause of restricted diffusion. 3 mm metastasis right cerebellum axial image 49. 3.5 mm metastasis left cerebellum axial image 33. 8 mm metastasis left cerebellum axial image 39. Possible punctate metastasis posterior left cerebellum axial image 41. Punctate metastasis left vermis axial image 44. No metastases identified in either cerebral hemisphere. Meningioma at the right frontal lateral convexity with a diameter of 2 cm and a thickness of 9 mm. No mass-effect upon the brain. Minimal small vessel change of the hemispheric white matter. No cortical or large vessel territory infarction. No hydrocephalus or extra-axial collection. Vascular: Major vessels at the base of the brain show flow. Skull and upper cervical spine: Negative Sinuses/Orbits: Clear/normal Other: None IMPRESSION: Five cerebellar metastases identified as described above. The largest is in the left cerebellum measuring 8 mm in diameter. No mass  effect or hemorrhage. No metastases identified in either cerebral hemisphere. Incidental meningioma at the right lateral frontal convexity with a diameter of 2 cm and a thickness of 9 mm. No mass-effect upon the brain. Electronically Signed   By: Nelson Chimes M.D.   On: 03/29/2020 14:46   NM PET Image Initial (PI) Skull Base To Thigh  Result Date: 03/22/2020 CLINICAL DATA:  Initial treatment strategy for non-small cell lung cancer staging. EXAM: NUCLEAR MEDICINE PET SKULL BASE TO THIGH TECHNIQUE: 8.86 mCi F-18 FDG was injected intravenously. Full-ring PET imaging was performed from the skull base to thigh after  the radiotracer. CT data was obtained and used for attenuation correction and anatomic localization. Fasting blood glucose: 96 mg/dl COMPARISON:  Chest CT from March 06, 2020 FINDINGS: Mediastinal blood pool activity: SUV max 3.86 Liver activity: SUV max NA NECK: Diffuse increased metabolic activity at the base of tongue is relatively symmetric. Slightly asymmetric towards the LEFT in the submandibular gland some mild fullness but without discrete mass in this location (SUVmax = 11.5 Diffusely hypermetabolic thyroid gland (SUVmax = 12.4) Diffuse hypermetabolism at level 2 bilaterally adjacent to carotid vasculature particularly on the LEFT.) Incidental CT findings: none CHEST: Diffusely abnormal exam with respect to the chest with numerous areas of increased metabolic activity. (Image 52, series 4) 14 mm RIGHT paratracheal lymph node (SUVmax = 14.4) Dominant mass in the LEFT upper lobe measuring approximately 4.6 x 4.6 cm (SUVmax = 25. Mass invades the extrapleural fat in the LEFT upper lobe in shows pleural involvement with circumferential metabolic activity tracking to the LEFT lung apex as exhibited on the recent diagnostic CT. Bi lateral hilar, anterior mediastinal and subcarinal adenopathy along with bilateral pulmonary nodules compatible with metastatic disease. Dominant lesion in the RIGHT chest measures 2.5 x 2.2 cm this shows some central cavitation suggesting some necrosis and displays less activity with some peripheral activity than other areas within the chest. Diffuse pleural disease and signs of chest wall involvement track through the LEFT chest particularly posteriorly on image 74 of series 4 where there is overt chest wall and rib involvement of the LEFT posterior eighth rib (SUVmax = 16.5) Incidental CT findings: Calcified atheromatous plaque in the thoracic aorta. Three-vessel coronary artery disease. Heart size is normal. No pericardial fluid. ABDOMEN/PELVIS: Heterogeneity of hepatic uptake  without discrete, focal hepatic lesion. Some areas on altered window wing suggested in the dome of the liver (image 89 of series 3 (SUVmax = 6.4) Focal antral uptake in the stomach with some uptake also in the mid stomach without PET correlate. Adrenal glands are normal. Perianal uptake, nonspecific. Incidental CT findings: No acute gastrointestinal process marked prostate heterogeneity and enlargement with variable uptake, nonspecific finding. (SUVmax = 5.3 but with diffusely variable uptake throughout the prostate. Calcified atheromatous plaque of the abdominal aorta extending in the iliac vessels.) SKELETON: Is signs of multifocal bony metastatic disease involving the sacrum, cervical, thoracic and lumbar spine as well as the RIGHT scapula. Multifocal rib lesions also demonstrated in addition to direct involvement from pleural and chest wall disease in the upper LEFT chest. Bilateral pelvic metastatic lesions. These involve iliac pubic bones and sacral ala as discussed. C2 lesion (SUVmax = 7.1) T8 lesion (SUVmax = 7.5) L1 lesion (SUVmax = 11.5) Example of sacral involvement in the RIGHT sacrum (SUVmax = 12.2 (image 155, series 4) many of these lesions without definitive CT correlates. LEFT pubic bone, anteriorly in the inferior pubic ramus (SUVmax = 9.5) Example of rib lesion away  from extensive pleural disease in the anterior LEFT chest involving the LEFT anterior second rib (SUVmax = 7.1 (image 63, series 4) multifocal rib lesions are seen bilaterally with similar appearance. Similar appearance of RIGHT scapular metastatic disease. Involvement of the cervical spine may be more extensive than suggested there is some motion which does limit assessment and there is variable uptake in the head in general as compared to the remainder of the exam. ) Incidental CT findings: Uptake along the costochondral margin (image 118, series 4) (SUVmax = 6.6 with potential costochondral fracture in this location) IMPRESSION: 1.  Widespread metastatic disease in the chest and 2 multifocal areas within the spine, ribs and pelvis as well as RIGHT scapula. 2. Increased metabolic activity about the base of tongue bilaterally and potentially in cervical lymph nodes though discrete nodes are not well demonstrated. Some of this activity in the neck could be related to acquisition artifact as there is diffuse activity about the face as well which has no CT correlate. Primary neoplasm is also considered in this location. Direct visualization may be warranted. 3. Diffuse thyroid uptake correlate with thyroid function and sonographic assessment. 4. Direct chest wall extension and multifocal rib involvement from direct extension and metastases. 5. Heterogeneous prostate is a nonspecific finding but with increased uptake would consider correlation with PSA for further assessment. 6. Nonspecific perianal uptake and gastric uptake, dedicated imaging of the abdomen and pelvis with diagnostic CT could potentially be of benefit to assess for any focal lesion, none seen on current PET though there is mild antral thickening. 7. Potential nondisplaced fracture of the costochondral junction inferiorly along the costal margin as described. These results will be called to the ordering clinician or representative by the Radiologist Assistant, and communication documented in the PACS or [employer]. Electronically Signed   By: Zetta Bills M.D.   On: 03/22/2020 16:41    ASSESSMENT AND PLAN: This is a very pleasant 79 years old white male recently diagnosed with a stage IV (T4, N3, M1C) non-small cell lung cancer, adenocarcinoma presented with left upper lobe lung mass in addition to bilateral pulmonary nodules as well as bilateral hilar and mediastinal lymphadenopathy and metastatic disease to the bones and brain diagnosed in July 2021. I had a lengthy discussion with the patient and his family about his current disease stage, prognosis and treatment  options. I personally and independently reviewed the scan images and discussed the result and showed the images to the patient and his family. I also requested a blood test to be sent to [facility] for molecular studies to identify any actionable mutations before starting his systemic therapy. The patient was giving the option of palliative care and hospice referral versus palliative systemic chemotherapy with carboplatin, Alimta and Keytruda if he has no actionable mutations.  If the patient has an actionable mutation, he will be treated with targeted therapy. The patient and his family are interested in treatment. I will arrange for the patient to have the first cycle of his systemic chemotherapy with carboplatin for AUC of 5, Alimta 500 mg/M2 and Keytruda 200 mg IV every 3 weeks starting next week if the molecular studies are negative for actionable mutation. I discussed with the patient the adverse effect of this treatment including but not limited to alopecia, myelosuppression, nausea and vomiting, peripheral neuropathy, liver or renal dysfunction as well as immunotherapy adverse effects.  I would also give him handout about his disease and treatment drugs. The patient will have a chemotherapy  education class before starting the first dose of his treatment. The patient will come back for follow-up visit in 2 weeks for evaluation and management of any adverse effect of his treatment. If there is no actionable mutations, I will refer the patient to radiation oncology for consideration of SRS to the new brain metastasis as well as the painful bone lesions. The patient will receive vitamin B12 injection today. I will send prescription to his pharmacy for folic acid 1 mg p.o. daily in addition to Compazine 10 mg p.o. every 6 hours as needed for nausea. For the lack of appetite and weight loss, I will start the patient on Marinol 2.5 mg p.o. twice daily. For the persistent cough, I will give the  patient prescription for Tessalon pearls.  He has hallucination with codeine products in the past. The patient was advised to call immediately if he has any other concerning symptoms in the interval. The patient voices understanding of current disease status and treatment options and is in agreement with the current care plan. All questions were answered. The patient knows to call the clinic with any problems, questions or concerns. We can certainly see the patient much sooner if necessary.  The total time spent in the appointment was 50 minutes.  Disclaimer: This note was dictated with voice recognition software. Similar sounding words can inadvertently be transcribed and may not be corrected upon review.

## 2020-03-31 ENCOUNTER — Telehealth: Payer: Self-pay | Admitting: *Deleted

## 2020-03-31 ENCOUNTER — Encounter: Payer: Self-pay | Admitting: *Deleted

## 2020-03-31 ENCOUNTER — Ambulatory Visit: Payer: Medicare PPO | Admitting: Internal Medicine

## 2020-03-31 NOTE — Progress Notes (Signed)
Per Dr. Julien Nordmann, I requested foundation one and PDL 1 testing on recent pathology

## 2020-03-31 NOTE — Telephone Encounter (Signed)
[facility] blood test completed on 03/25/20 per Dr. Julien Nordmann.

## 2020-03-31 NOTE — Progress Notes (Signed)
[place] Work  Clinical Social Work received referral from medical oncology for caregiver support.  CSW contacted patients wife at home to offer support and assess for needs.  Patients wife was appreciative of CSW call, but was leaving for an appointment.  Patients wife stated they were all "doing ok today".  Patients wife stated she planned to call CSW back this afternoon, and was also open to CSW contacting by phone tomorrow.   Johnnye Lana, MSW, LCSW, OSW-C Clinical Social Worker [facility] [phone]

## 2020-03-31 NOTE — Telephone Encounter (Signed)
Foundation one and PDL 1 requested

## 2020-04-01 ENCOUNTER — Telehealth: Payer: Self-pay | Admitting: Medical Oncology

## 2020-04-01 ENCOUNTER — Encounter: Payer: Self-pay | Admitting: *Deleted

## 2020-04-01 NOTE — Progress Notes (Signed)
[place] Work  Holiday representative contacted patients wife at home to follow up on caregiver needs and offer support.  Patients wife had multiple questions regarding home health and equipment/supplies that would be helpful.  CSW and patients wife discussed home health and the services they provide.  Patients wife also had questions about patients medications and "stayting organized".  CSW shared patients wife's questions and concerns with RN and RN navigator.  CSW provided education on CSW role and support services and programs at [facility].  Patients wife requested to be added to the monthly support calendar mailing.  CSW also emailed current months programs.  CSW provided information on transportation resources and possible transportation benefits through patients [employer].  CSW and patients wife discussed the importance of caregiver support, and available resources.  Patients wife was appreciative of CSW contact, and knows to contact CSW with needs or concerns.     Johnnye Lana, MSW, LCSW, OSW-C Clinical Social Worker [facility] [phone]

## 2020-04-01 NOTE — Progress Notes (Signed)
Oncology Nurse Navigator Documentation  Oncology Nurse Navigator Flowsheets 04/01/2020  Abnormal Finding Date 02/21/2020  Confirmed Diagnosis Date 03/24/2020  Diagnosis Status Pending Molecular Studies  Planned Course of Treatment Chemotherapy;Targeted Therapy  Navigator Follow Up Date: 04/13/2020  Navigator Follow Up Reason: Follow-up Appointment  Navigator Location [facility]  Navigator Encounter Type Other: I followed up on Phillip Franklin plan of care.  [name] setup for his plan at this time.   Treatment Initiated Date 04/06/2020  Treatment Phase Pre-Tx/Tx Discussion  Barriers/Navigation Needs Coordination of Care  Interventions Other  Acuity Level 2-Minimal Needs (1-2 Barriers Identified)  Time Spent with Patient 15

## 2020-04-01 NOTE — Telephone Encounter (Signed)
Equipment. Wife requests bsc, urinal and waterproof mattress. rx for [facility] given to [facility]   I told Abigal to tell wife to get urinal and mattress pad from [place] supply store or similar store.

## 2020-04-02 ENCOUNTER — Telehealth: Payer: Self-pay | Admitting: *Deleted

## 2020-04-02 ENCOUNTER — Other Ambulatory Visit: Payer: Self-pay

## 2020-04-02 ENCOUNTER — Inpatient Hospital Stay: Payer: Medicare PPO

## 2020-04-02 NOTE — Telephone Encounter (Signed)
Faxed prescription for [facility] to [place].

## 2020-04-03 ENCOUNTER — Other Ambulatory Visit: Payer: Self-pay

## 2020-04-03 DIAGNOSIS — C3491 Malignant neoplasm of unspecified part of right bronchus or lung: Secondary | ICD-10-CM | POA: Diagnosis not present

## 2020-04-03 DIAGNOSIS — C3492 Malignant neoplasm of unspecified part of left bronchus or lung: Secondary | ICD-10-CM

## 2020-04-04 ENCOUNTER — Telehealth: Payer: Self-pay | Admitting: Internal Medicine

## 2020-04-04 NOTE — Telephone Encounter (Signed)
I called the patient and his wife today.  I updated them about the results from the molecular studies by [facility] [employer] that showed no actionable mutations. We will proceed with systemic chemotherapy as planned on 04/06/2020.  The patient and his wife are in agreement with the current plan.  I will also check with pathology to see if there is any sufficient tissue biopsy material to send to [facility] for molecular studies and PD-L1 expression in case there is any missed mutations from the blood test.

## 2020-04-06 ENCOUNTER — Inpatient Hospital Stay: Payer: Medicare PPO

## 2020-04-06 ENCOUNTER — Other Ambulatory Visit: Payer: Self-pay

## 2020-04-06 ENCOUNTER — Other Ambulatory Visit: Payer: Self-pay | Admitting: Internal Medicine

## 2020-04-06 VITALS — BP 148/77 | HR 93 | Temp 97.8°F | Resp 18 | Wt 156.5 lb

## 2020-04-06 DIAGNOSIS — R05 Cough: Secondary | ICD-10-CM | POA: Diagnosis not present

## 2020-04-06 DIAGNOSIS — C7951 Secondary malignant neoplasm of bone: Secondary | ICD-10-CM | POA: Diagnosis not present

## 2020-04-06 DIAGNOSIS — C3492 Malignant neoplasm of unspecified part of left bronchus or lung: Secondary | ICD-10-CM

## 2020-04-06 DIAGNOSIS — Z5112 Encounter for antineoplastic immunotherapy: Secondary | ICD-10-CM | POA: Diagnosis not present

## 2020-04-06 DIAGNOSIS — R634 Abnormal weight loss: Secondary | ICD-10-CM | POA: Diagnosis not present

## 2020-04-06 DIAGNOSIS — Z87891 Personal history of nicotine dependence: Secondary | ICD-10-CM | POA: Diagnosis not present

## 2020-04-06 DIAGNOSIS — C3490 Malignant neoplasm of unspecified part of unspecified bronchus or lung: Secondary | ICD-10-CM | POA: Diagnosis not present

## 2020-04-06 DIAGNOSIS — C7931 Secondary malignant neoplasm of brain: Secondary | ICD-10-CM | POA: Diagnosis not present

## 2020-04-06 LAB — CMP (CANCER CENTER ONLY)
ALT: 41 U/L (ref 0–44)
AST: 31 U/L (ref 15–41)
Albumin: 3.1 g/dL — ABNORMAL LOW (ref 3.5–5.0)
Alkaline Phosphatase: 175 U/L — ABNORMAL HIGH (ref 38–126)
Anion gap: 11 (ref 5–15)
BUN: 14 mg/dL (ref 8–23)
CO2: 22 mmol/L (ref 22–32)
Calcium: 10.4 mg/dL — ABNORMAL HIGH (ref 8.9–10.3)
Chloride: 108 mmol/L (ref 98–111)
Creatinine: 1.48 mg/dL — ABNORMAL HIGH (ref 0.61–1.24)
GFR, Est AFR Am: 51 mL/min — ABNORMAL LOW (ref >60)
GFR, Estimated: 44 mL/min — ABNORMAL LOW (ref >60)
Glucose, Bld: 176 mg/dL — ABNORMAL HIGH (ref 70–99)
Potassium: 3.5 mmol/L (ref 3.5–5.1)
Sodium: 141 mmol/L (ref 135–145)
Total Bilirubin: 0.5 mg/dL (ref 0.3–1.2)
Total Protein: 7.8 g/dL (ref 6.5–8.1)

## 2020-04-06 LAB — CBC WITH DIFFERENTIAL (CANCER CENTER ONLY)
Abs Immature Granulocytes: 0.03 10*3/uL (ref 0.00–0.07)
Basophils Absolute: 0 10*3/uL (ref 0.0–0.1)
Basophils Relative: 1 %
Eosinophils Absolute: 0.2 10*3/uL (ref 0.0–0.5)
Eosinophils Relative: 3 %
HCT: 45.3 % (ref 39.0–52.0)
Hemoglobin: 14.3 g/dL (ref 13.0–17.0)
Immature Granulocytes: 0 %
Lymphocytes Relative: 23 %
Lymphs Abs: 2 10*3/uL (ref 0.7–4.0)
MCH: 28 pg (ref 26.0–34.0)
MCHC: 31.6 g/dL (ref 30.0–36.0)
MCV: 88.8 fL (ref 80.0–100.0)
Monocytes Absolute: 0.4 10*3/uL (ref 0.1–1.0)
Monocytes Relative: 5 %
Neutro Abs: 6.1 10*3/uL (ref 1.7–7.7)
Neutrophils Relative %: 68 %
Platelet Count: 275 10*3/uL (ref 150–400)
RBC: 5.1 MIL/uL (ref 4.22–5.81)
RDW: 14.8 % (ref 11.5–15.5)
WBC Count: 8.8 10*3/uL (ref 4.0–10.5)
nRBC: 0 % (ref 0.0–0.2)

## 2020-04-06 MED ORDER — SODIUM CHLORIDE 0.9 % IV SOLN
326.0000 mg | Freq: Once | INTRAVENOUS | Status: AC
  Administered 2020-04-06: 330 mg via INTRAVENOUS
  Filled 2020-04-06: qty 33

## 2020-04-06 MED ORDER — SODIUM CHLORIDE 0.9 % IV SOLN
500.0000 mg/m2 | Freq: Once | INTRAVENOUS | Status: AC
  Administered 2020-04-06: 900 mg via INTRAVENOUS
  Filled 2020-04-06: qty 20

## 2020-04-06 MED ORDER — SODIUM CHLORIDE 0.9 % IV SOLN
10.0000 mg | Freq: Once | INTRAVENOUS | Status: AC
  Administered 2020-04-06: 10 mg via INTRAVENOUS
  Filled 2020-04-06: qty 10

## 2020-04-06 MED ORDER — SODIUM CHLORIDE 0.9 % IV SOLN
Freq: Once | INTRAVENOUS | Status: AC
  Filled 2020-04-06: qty 250

## 2020-04-06 MED ORDER — PALONOSETRON HCL INJECTION 0.25 MG/5ML
INTRAVENOUS | Status: AC
  Filled 2020-04-06: qty 5

## 2020-04-06 MED ORDER — SODIUM CHLORIDE 0.9 % IV SOLN
200.0000 mg | Freq: Once | INTRAVENOUS | Status: AC
  Administered 2020-04-06: 200 mg via INTRAVENOUS
  Filled 2020-04-06: qty 8

## 2020-04-06 MED ORDER — PALONOSETRON HCL INJECTION 0.25 MG/5ML
0.2500 mg | Freq: Once | INTRAVENOUS | Status: AC
  Administered 2020-04-06: 0.25 mg via INTRAVENOUS

## 2020-04-06 MED ORDER — SODIUM CHLORIDE 0.9 % IV SOLN
150.0000 mg | Freq: Once | INTRAVENOUS | Status: AC
  Administered 2020-04-06: 150 mg via INTRAVENOUS
  Filled 2020-04-06: qty 150

## 2020-04-06 NOTE — Patient Instructions (Signed)
Pembrolizumab injection What is this medicine? PEMBROLIZUMAB (pem broe liz ue mab) is a monoclonal antibody. It is used to treat certain types of cancer. This medicine may be used for other purposes; ask your health care provider or pharmacist if you have questions. COMMON BRAND NAME(S): Keytruda What should I tell my health care provider before I take this medicine? They need to know if you have any of these conditions:  diabetes  immune system problems  inflammatory bowel disease  liver disease  lung or breathing disease  lupus  received or scheduled to receive an organ transplant or a stem-cell transplant that uses donor stem cells  an unusual or allergic reaction to pembrolizumab, other medicines, foods, dyes, or preservatives  pregnant or trying to get pregnant  breast-feeding How should I use this medicine? This medicine is for infusion into a vein. It is given by a health care professional in a hospital or clinic setting. A special MedGuide will be given to you before each treatment. Be sure to read this information carefully each time. Talk to your pediatrician regarding the use of this medicine in children. While this drug may be prescribed for children as young as 6 months for selected conditions, precautions do apply. Overdosage: If you think you have taken too much of this medicine contact a poison control center or emergency room at once. NOTE: This medicine is only for you. Do not share this medicine with others. What if I miss a dose? It is important not to miss your dose. Call your doctor or health care professional if you are unable to keep an appointment. What may interact with this medicine? Interactions have not been studied. Give your health care provider a list of all the medicines, herbs, non-prescription drugs, or dietary supplements you use. Also tell them if you smoke, drink alcohol, or use illegal drugs. Some items may interact with your medicine. This  list may not describe all possible interactions. Give your health care provider a list of all the medicines, herbs, non-prescription drugs, or dietary supplements you use. Also tell them if you smoke, drink alcohol, or use illegal drugs. Some items may interact with your medicine. What should I watch for while using this medicine? Your condition will be monitored carefully while you are receiving this medicine. You may need blood work done while you are taking this medicine. Do not become pregnant while taking this medicine or for 4 months after stopping it. Women should inform their doctor if they wish to become pregnant or think they might be pregnant. There is a potential for serious side effects to an unborn child. Talk to your health care professional or pharmacist for more information. Do not breast-feed an infant while taking this medicine or for 4 months after the last dose. What side effects may I notice from receiving this medicine? Side effects that you should report to your doctor or health care professional as soon as possible:  allergic reactions like skin rash, itching or hives, swelling of the face, lips, or tongue  bloody or black, tarry  breathing problems  changes in vision  chest pain  chills  confusion  constipation  cough  diarrhea  dizziness or feeling faint or lightheaded  fast or irregular heartbeat  fever  flushing  joint pain  low blood counts - this medicine may decrease the number of white blood cells, red blood cells and platelets. You may be at increased risk for infections and bleeding.  muscle pain  muscle   weakness  pain, tingling, numbness in the hands or feet  persistent headache  redness, blistering, peeling or loosening of the skin, including inside the mouth  signs and symptoms of high blood sugar such as dizziness; dry mouth; dry skin; fruity breath; nausea; stomach pain; increased hunger or thirst; increased urination  signs  and symptoms of kidney injury like trouble passing urine or change in the amount of urine  signs and symptoms of liver injury like dark urine, light-colored stools, loss of appetite, nausea, right upper belly pain, yellowing of the eyes or skin  sweating  swollen lymph nodes  weight loss Side effects that usually do not require medical attention (report to your doctor or health care professional if they continue or are bothersome):  decreased appetite  hair loss  muscle pain  tiredness This list may not describe all possible side effects. Call your doctor for medical advice about side effects. You may report side effects to FDA at 1-800-FDA-1088. Where should I keep my medicine? This drug is given in a hospital or clinic and will not be stored at home. NOTE: This sheet is a summary. It may not cover all possible information. If you have questions about this medicine, talk to your doctor, pharmacist, or health care provider.  2020 Elsevier/Gold Standard (2019-07-12 18:07:58) Pemetrexed injection What is this medicine? PEMETREXED (PEM e TREX ed) is a chemotherapy drug used to treat lung cancers like non-small cell lung cancer and mesothelioma. It may also be used to treat other cancers. This medicine may be used for other purposes; ask your health care provider or pharmacist if you have questions. COMMON BRAND NAME(S): Alimta What should I tell my health care provider before I take this medicine? They need to know if you have any of these conditions:  infection (especially a virus infection such as chickenpox, cold sores, or herpes)  kidney disease  low blood counts, like low white cell, platelet, or red cell counts  lung or breathing disease, like asthma  radiation therapy  an unusual or allergic reaction to pemetrexed, other medicines, foods, dyes, or preservative  pregnant or trying to get pregnant  breast-feeding How should I use this medicine? This drug is given as  an infusion into a vein. It is administered in a hospital or clinic by a specially trained health care professional. Talk to your pediatrician regarding the use of this medicine in children. Special care may be needed. Overdosage: If you think you have taken too much of this medicine contact a poison control center or emergency room at once. NOTE: This medicine is only for you. Do not share this medicine with others. What if I miss a dose? It is important not to miss your dose. Call your doctor or health care professional if you are unable to keep an appointment. What may interact with this medicine? This medicine may interact with the following medications:  Ibuprofen This list may not describe all possible interactions. Give your health care provider a list of all the medicines, herbs, non-prescription drugs, or dietary supplements you use. Also tell them if you smoke, drink alcohol, or use illegal drugs. Some items may interact with your medicine. What should I watch for while using this medicine? Visit your doctor for checks on your progress. This drug may make you feel generally unwell. This is not uncommon, as chemotherapy can affect healthy cells as well as cancer cells. Report any side effects. Continue your course of treatment even though you feel ill unless   your doctor tells you to stop. In some cases, you may be given additional medicines to help with side effects. Follow all directions for their use. Call your doctor or health care professional for advice if you get a fever, chills or sore throat, or other symptoms of a cold or flu. Do not treat yourself. This drug decreases your body's ability to fight infections. Try to avoid being around people who are sick. This medicine may increase your risk to bruise or bleed. Call your doctor or health care professional if you notice any unusual bleeding. Be careful brushing and flossing your teeth or using a toothpick because you may get an  infection or bleed more easily. If you have any dental work done, tell your dentist you are receiving this medicine. Avoid taking products that contain aspirin, acetaminophen, ibuprofen, naproxen, or ketoprofen unless instructed by your doctor. These medicines may hide a fever. Call your doctor or health care professional if you get diarrhea or mouth sores. Do not treat yourself. To protect your kidneys, drink water or other fluids as directed while you are taking this medicine. Do not become pregnant while taking this medicine or for 6 months after stopping it. Women should inform their doctor if they wish to become pregnant or think they might be pregnant. Men should not father a child while taking this medicine and for 3 months after stopping it. This may interfere with the ability to father a child. You should talk to your doctor or health care professional if you are concerned about your fertility. There is a potential for serious side effects to an unborn child. Talk to your health care professional or pharmacist for more information. Do not breast-feed an infant while taking this medicine or for 1 week after stopping it. What side effects may I notice from receiving this medicine? Side effects that you should report to your doctor or health care professional as soon as possible:  allergic reactions like skin rash, itching or hives, swelling of the face, lips, or tongue  breathing problems  redness, blistering, peeling or loosening of the skin, including inside the mouth  signs and symptoms of bleeding such as bloody or black, tarry stools; red or dark-brown urine; spitting up blood or brown material that looks like coffee grounds; red spots on the skin; unusual bruising or bleeding from the eye, gums, or nose  signs and symptoms of infection like fever or chills; cough; sore throat; pain or trouble passing urine  signs and symptoms of kidney injury like trouble passing urine or change in the  amount of urine  signs and symptoms of liver injury like dark yellow or brown urine; general ill feeling or flu-like symptoms; light-colored stools; loss of appetite; nausea; right upper belly pain; unusually weak or tired; yellowing of the eyes or skin Side effects that usually do not require medical attention (report to your doctor or health care professional if they continue or are bothersome):  constipation  mouth sores  nausea, vomiting  unusually weak or tired This list may not describe all possible side effects. Call your doctor for medical advice about side effects. You may report side effects to FDA at 1-800-FDA-1088. Where should I keep my medicine? This drug is given in a hospital or clinic and will not be stored at home. NOTE: This sheet is a summary. It may not cover all possible information. If you have questions about this medicine, talk to your doctor, pharmacist, or health care provider.  2020   Elsevier/Gold Standard (2017-10-25 16:11:33) Carboplatin injection What is this medicine? CARBOPLATIN (KAR boe pla tin) is a chemotherapy drug. It targets fast dividing cells, like cancer cells, and causes these cells to die. This medicine is used to treat ovarian cancer and many other cancers. This medicine may be used for other purposes; ask your health care provider or pharmacist if you have questions. COMMON BRAND NAME(S): Paraplatin What should I tell my health care provider before I take this medicine? They need to know if you have any of these conditions:  blood disorders  hearing problems  kidney disease  recent or ongoing radiation therapy  an unusual or allergic reaction to carboplatin, cisplatin, other chemotherapy, other medicines, foods, dyes, or preservatives  pregnant or trying to get pregnant  breast-feeding How should I use this medicine? This drug is usually given as an infusion into a vein. It is administered in a hospital or clinic by a specially  trained health care professional. Talk to your pediatrician regarding the use of this medicine in children. Special care may be needed. Overdosage: If you think you have taken too much of this medicine contact a poison control center or emergency room at once. NOTE: This medicine is only for you. Do not share this medicine with others. What if I miss a dose? It is important not to miss a dose. Call your doctor or health care professional if you are unable to keep an appointment. What may interact with this medicine?  medicines for seizures  medicines to increase blood counts like filgrastim, pegfilgrastim, sargramostim  some antibiotics like amikacin, gentamicin, neomycin, streptomycin, tobramycin  vaccines Talk to your doctor or health care professional before taking any of these medicines:  acetaminophen  aspirin  ibuprofen  ketoprofen  naproxen This list may not describe all possible interactions. Give your health care provider a list of all the medicines, herbs, non-prescription drugs, or dietary supplements you use. Also tell them if you smoke, drink alcohol, or use illegal drugs. Some items may interact with your medicine. What should I watch for while using this medicine? Your condition will be monitored carefully while you are receiving this medicine. You will need important blood work done while you are taking this medicine. This drug may make you feel generally unwell. This is not uncommon, as chemotherapy can affect healthy cells as well as cancer cells. Report any side effects. Continue your course of treatment even though you feel ill unless your doctor tells you to stop. In some cases, you may be given additional medicines to help with side effects. Follow all directions for their use. Call your doctor or health care professional for advice if you get a fever, chills or sore throat, or other symptoms of a cold or flu. Do not treat yourself. This drug decreases your body's  ability to fight infections. Try to avoid being around people who are sick. This medicine may increase your risk to bruise or bleed. Call your doctor or health care professional if you notice any unusual bleeding. Be careful brushing and flossing your teeth or using a toothpick because you may get an infection or bleed more easily. If you have any dental work done, tell your dentist you are receiving this medicine. Avoid taking products that contain aspirin, acetaminophen, ibuprofen, naproxen, or ketoprofen unless instructed by your doctor. These medicines may hide a fever. Do not become pregnant while taking this medicine. Women should inform their doctor if they wish to become pregnant or think they might   be pregnant. There is a potential for serious side effects to an unborn child. Talk to your health care professional or pharmacist for more information. Do not breast-feed an infant while taking this medicine. What side effects may I notice from receiving this medicine? Side effects that you should report to your doctor or health care professional as soon as possible:  allergic reactions like skin rash, itching or hives, swelling of the face, lips, or tongue  signs of infection - fever or chills, cough, sore throat, pain or difficulty passing urine  signs of decreased platelets or bleeding - bruising, pinpoint red spots on the skin, black, tarry stools, nosebleeds  signs of decreased red blood cells - unusually weak or tired, fainting spells, lightheadedness  breathing problems  changes in hearing  changes in vision  chest pain  high blood pressure  low blood counts - This drug may decrease the number of white blood cells, red blood cells and platelets. You may be at increased risk for infections and bleeding.  nausea and vomiting  pain, swelling, redness or irritation at the injection site  pain, tingling, numbness in the hands or feet  problems with balance, talking,  walking  trouble passing urine or change in the amount of urine Side effects that usually do not require medical attention (report to your doctor or health care professional if they continue or are bothersome):  hair loss  loss of appetite  metallic taste in the mouth or changes in taste This list may not describe all possible side effects. Call your doctor for medical advice about side effects. You may report side effects to FDA at 1-800-FDA-1088. Where should I keep my medicine? This drug is given in a hospital or clinic and will not be stored at home. NOTE: This sheet is a summary. It may not cover all possible information. If you have questions about this medicine, talk to your doctor, pharmacist, or health care provider.  2020 Elsevier/Gold Standard (2007-12-11 14:38:05)  

## 2020-04-06 NOTE — Progress Notes (Signed)
SCr = 1.48, CrCl = 40. Okay to treat with Alimta per Dr. Julien Nordmann.

## 2020-04-07 ENCOUNTER — Other Ambulatory Visit: Payer: Self-pay | Admitting: Internal Medicine

## 2020-04-07 NOTE — Progress Notes (Signed)
Radiation Oncology         [phone] ________________________________  Initial Outpatient Consultation  Name: Phillip Franklin MRN: [id number]  Date: 04/09/2020  DOB: 03-Dec-1940  MG:[id number], Jori Moll, MD  Curt Bears, MD   REFERRING PHYSICIAN: Curt Bears, MD  DIAGNOSIS: The encounter diagnosis was Adenocarcinoma of left lung, stage 4 ([place]).  Stage IV (T4, N3, M1c) non-small cell lung cancer, adenocarcinoma of the left upper lobe  HISTORY OF PRESENT ILLNESS::Phillip Franklin is a 79 y.o. male who is seen as a courtesy of Dr Julien Nordmann for an opinion concerning radiation therapy as part of management for his recently diagnosed metastatic lung cancer. Today, he is accompanied by wife. The patient presented to Dr. Delfina Redwood, PCP, on 02/21/2020 with complaints of weight loss and cough. Chest x-ray at that time showed a 5.3 x 3.6 cm left apex mass that was suspicious for pulmonary neoplasm. Additionally, bilateral hilar enlargement raised the question of adenopathy along with the numerous small nodules throughout both lungs, highly suspicious for metastatic disease. Finally, it showed underlying chronic bronchitic and interstitial lung disease changes.  CT scan of chest on 03/05/2020 showed a large mass in the apex of the left upper lobe with evidence of widespread metastatic disease throughout the lungs bilaterally, as well as pleural metastases in the left hemithorax and nodal disease in the mediastinal and hilar regions bilaterally. Findings likely reflected a primary left upper lobe bronchogenic carcinoma with metastatic disease.  The patient was referred to Dr. Julien Nordmann and was seen in consultation on 03/13/2020. At that time, it was recommended that the patient proceed with PET scan and brain MRI to complete the staging work-up.  PET scan on 03/20/2020 showed widespread metastatic disease in the chest and two multifocal areas within the spine, ribs, pelvis, right scapula. There was increased  metabolic activity about the base of the tongue bilaterally and potentially in cervical lymph nodes, though discrete nodes were not demonstrated. Some of the activity in the neck could be related to acquisition artifact, as there was diffuse activity about the face as well, which had no CT correlate. Primary neoplasm was also considered in that location. Additionally, there was diffuse thyroid uptake, direct chest wall extension and multifocal rib involvement from direct extension and metastases, an indeterminate heterogeneous prostate with increased uptake, non-specific perianal uptake and gastric uptake, mild antral thickening, and a potential non-displaced fracture of the costochondral junction inferiorly along the costal margin.  The patient underwent a flexible and EBUS bronchoscopy on 03/24/2020 that was performed by Dr. Tamala Julian. Cytology from the procedure revealed malignant cells consistent with adenocarcinoma.  MRI of brain on 03/27/2020 showed five cerebellar metastases, the largest being in the left cerebellum and measuring 8 mm in diameter. No mass effect or hemorrhage. There was also noted to be an incidental meningioma at the right lateral frontal convexity with a diameter of 2 cm and a thickness of 9 mm; no mass-effect upon the brain. There were no metastases identified in either cerebral hemispheres.   The patient was last seen by Dr. Julien Nordmann on 03/30/2020, during which time they discussed different options including palliative care and hospice referral versus palliative systemic chemotherapy. The patient opted for systemic chemotherapy with Carboplatin, Alimta, and Keytruda. First dose was given on 04/06/2020. Molecular studies were negative for actionable mutation. The patient was then referred to radiation oncology for consideration of stereotactic radiosurgery to the new brain metastasis as well as the painful bone lesions.  PREVIOUS RADIATION THERAPY: No  PAST MEDICAL  HISTORY:  Past  Medical History:  Diagnosis Date  . Depression   . GERD (gastroesophageal reflux disease)    occasional, diet controlled  . High cholesterol   . Hypertension    no longer taking medication - states it's undercontrol  . Hypothyroid     PAST SURGICAL HISTORY: Past Surgical History:  Procedure Laterality Date  . BRONCHIAL NEEDLE ASPIRATION BIOPSY  03/24/2020   Procedure: BRONCHIAL NEEDLE ASPIRATION BIOPSIES;  Surgeon: Candee Furbish, MD;  Location: [facility] ENDOSCOPY;  Service: Pulmonary;;  . HEMORRHOID SURGERY    . VIDEO BRONCHOSCOPY WITH ENDOBRONCHIAL ULTRASOUND N/A 03/24/2020   Procedure: VIDEO BRONCHOSCOPY WITH ENDOBRONCHIAL ULTRASOUND;  Surgeon: Candee Furbish, MD;  Location: [facility] ENDOSCOPY;  Service: Pulmonary;  Laterality: N/A;    FAMILY HISTORY:  Family History  Problem Relation Age of Onset  . Heart disease Father     SOCIAL HISTORY:  Social History   Tobacco Use  . Smoking status: Former Smoker    Types: Cigarettes, Pipe    Quit date: 11/13/1977    Years since quitting: 42.4  . Smokeless tobacco: Never Used  Vaping Use  . Vaping Use: Never used  Substance Use Topics  . Alcohol use: No  . Drug use: No    ALLERGIES:  Allergies  Allergen Reactions  . Codeine Other (See Comments)    hallucinations  . Hydrocodone Other (See Comments)    hallucinations  . Oxycodone Other (See Comments)    hallucinations  . Penicillins Nausea And Vomiting    MEDICATIONS:  Current Outpatient Medications  Medication Sig Dispense Refill  . acetaminophen (TYLENOL) 325 MG tablet Take 325-650 mg by mouth every 6 (six) hours as needed for moderate pain or headache.    . Ascorbic Acid (VITAMIN C ADULT GUMMIES PO) Take 500-1,000 mg by mouth daily.    Marland Kitchen aspirin 81 MG tablet Take 81 mg by mouth every other day.     Marland Kitchen atorvastatin (LIPITOR) 20 MG tablet Take 20 mg by mouth daily.    . benzonatate (TESSALON) 200 MG capsule Take 1 capsule by mouth three times daily as needed for cough 20 capsule  0  . Biotin (BIOTIN 5000) 5 MG CAPS Take 5 mg by mouth every evening.    . Camphor-Eucalyptus-Menthol (VICKS VAPORUB EX) Apply 1 application topically daily as needed (congestion).    . Cholecalciferol (DIALYVITE VITAMIN D 5000) 125 MCG (5000 UT) capsule Take 5,000 Units by mouth daily.    . Coenzyme Q10 (COQ-10) 100 MG CAPS Take 100 mg by mouth every evening.    . dronabinol (MARINOL) 2.5 MG capsule Take 1 capsule (2.5 mg total) by mouth 2 (two) times daily before lunch and supper. 60 capsule 0  . esomeprazole (NEXIUM) 20 MG capsule Take 20 mg by mouth daily as needed (acid reflux).    . folic acid (FOLVITE) 1 MG tablet Take 1 tablet (1 mg total) by mouth daily. 30 tablet 4  . levothyroxine (SYNTHROID) 88 MCG tablet Take 88 mcg by mouth daily before breakfast.    . LUTEIN PO Take 25 mg by mouth every evening.    . Magnesium 400 MG CAPS Take 400 mg by mouth every evening.    . Menthol, Topical Analgesic, (BENGAY EX) Apply 1 application topically daily as needed (pain).    . Menthol-Camphor (SOMBRA NATURAL PAIN RELIEVING EX) Apply 1 application topically daily as needed (pain).    . mirtazapine (REMERON) 30 MG tablet Take 1 tablet (30 mg total) by mouth at  bedtime. 30 tablet 2  . OVER THE COUNTER MEDICATION Take 15 mLs by mouth at bedtime. Colsys oral rinse    . polyethylene glycol (MIRALAX) packet Take 17 g by mouth daily. (Patient taking differently: Take 17 g by mouth daily as needed for mild constipation. ) 14 each 0  . prochlorperazine (COMPAZINE) 10 MG tablet Take 1 tablet (10 mg total) by mouth every 6 (six) hours as needed for nausea or vomiting. 30 tablet 0  . Saw Palmetto, Serenoa repens, (SAW PALMETTO PO) Take 1 capsule by mouth every evening.    . tamsulosin (FLOMAX) 0.4 MG CAPS capsule Take 0.4 mg by mouth every evening.      No current facility-administered medications for this encounter.    REVIEW OF SYSTEMS:  A 10+ POINT REVIEW OF SYSTEMS WAS OBTAINED including neurology,  dermatology, psychiatry, cardiac, respiratory, lymph, extremities, GI, GU, musculoskeletal, constitutional, reproductive, HEENT.  Patient denies any pain along his rib cage area at this time.  This was related to excessive coughing described the pain is in the anterior bilateral lower rib cage area.  Denies any other areas of pain along the spine or pelvis region.  He denies any headaches or visual problems.  He does report occasional unsteadiness when quickly standing.  He denies any balance issues.  He reports tolerating his chemotherapy well thus far   PHYSICAL EXAM:  height is 5\' 8"  (1.727 m) and weight is 164 lb 6.4 oz (74.6 kg). His temperature is 98.7 F (37.1 C). His blood pressure is 167/75 (abnormal) and his pulse is 106 (abnormal). His respiration is 24 (abnormal) and oxygen saturation is 100%.   General: Alert and oriented, in no acute distress, somewhat hard of hearing HEENT: Head is normocephalic. Extraocular movements are intact. Oropharynx is clear. Neck: Neck is supple, no palpable cervical or supraclavicular lymphadenopathy. Heart: Regular in rate and rhythm with no murmurs, rubs, or gallops. Chest: Clear to auscultation bilaterally, with no rhonchi, wheezes, or rales.  No areas of discomfort with palpation along the rib cage area or spine Abdomen: Soft, nontender, nondistended, with no rigidity or guarding. Extremities: No cyanosis or edema. Lymphatics: see Neck Exam Skin: No concerning lesions. Musculoskeletal: symmetric strength and muscle tone throughout. Neurologic: Cranial nerves II through XII are grossly intact. No obvious focalities. Speech is fluent. Coordination is intact. Psychiatric: Judgment and insight are intact. Affect is appropriate.   ECOG = 1  0 - Asymptomatic (Fully active, able to carry on all predisease activities without restriction)  1 - Symptomatic but completely ambulatory (Restricted in physically strenuous activity but ambulatory and able to carry  out work of a light or sedentary nature. For example, light housework, office work)  2 - Symptomatic, <50% in bed during the day (Ambulatory and capable of all self care but unable to carry out any work activities. Up and about more than 50% of waking hours)  3 - Symptomatic, >50% in bed, but not bedbound (Capable of only limited self-care, confined to bed or chair 50% or more of waking hours)  4 - Bedbound (Completely disabled. Cannot carry on any self-care. Totally confined to bed or chair)  5 - Death   Eustace Pen MM, Creech RH, Tormey DC, et al. [phone]). "Toxicity and response criteria of the [facility]". [place] Oncol. 5 (6): 649-55  LABORATORY DATA:  Lab Results  Component Value Date   WBC 8.8 04/06/2020   HGB 14.3 04/06/2020   HCT 45.3 04/06/2020   MCV 88.8  04/06/2020   PLT 275 04/06/2020   NEUTROABS 6.1 04/06/2020   Lab Results  Component Value Date   NA 141 04/06/2020   K 3.5 04/06/2020   CL 108 04/06/2020   CO2 22 04/06/2020   GLUCOSE 176 (H) 04/06/2020   CREATININE 1.48 (H) 04/06/2020   CALCIUM 10.4 (H) 04/06/2020      RADIOGRAPHY: MR BRAIN W WO CONTRAST  Result Date: 03/29/2020 CLINICAL DATA:  Staging non-small cell lung cancer. EXAM: MRI HEAD WITHOUT AND WITH CONTRAST TECHNIQUE: Multiplanar, multiecho pulse sequences of the brain and surrounding structures were obtained without and with intravenous contrast. CONTRAST:  57mL GADAVIST GADOBUTROL 1 MMOL/ML IV SOLN COMPARISON:  Head CT 08/29/2011 FINDINGS: Brain: Diffusion imaging does not show any acute or subacute infarction or other cause of restricted diffusion. 3 mm metastasis right cerebellum axial image 49. 3.5 mm metastasis left cerebellum axial image 33. 8 mm metastasis left cerebellum axial image 39. Possible punctate metastasis posterior left cerebellum axial image 41. Punctate metastasis left vermis axial image 44. No metastases identified in either cerebral hemisphere. Meningioma at the  right frontal lateral convexity with a diameter of 2 cm and a thickness of 9 mm. No mass-effect upon the brain. Minimal small vessel change of the hemispheric white matter. No cortical or large vessel territory infarction. No hydrocephalus or extra-axial collection. Vascular: Major vessels at the base of the brain show flow. Skull and upper cervical spine: Negative Sinuses/Orbits: Clear/normal Other: None IMPRESSION: Five cerebellar metastases identified as described above. The largest is in the left cerebellum measuring 8 mm in diameter. No mass effect or hemorrhage. No metastases identified in either cerebral hemisphere. Incidental meningioma at the right lateral frontal convexity with a diameter of 2 cm and a thickness of 9 mm. No mass-effect upon the brain. Electronically Signed   By: Nelson Chimes M.D.   On: 03/29/2020 14:46   NM PET Image Initial (PI) Skull Base To Thigh  Result Date: 03/22/2020 CLINICAL DATA:  Initial treatment strategy for non-small cell lung cancer staging. EXAM: NUCLEAR MEDICINE PET SKULL BASE TO THIGH TECHNIQUE: 8.86 mCi F-18 FDG was injected intravenously. Full-ring PET imaging was performed from the skull base to thigh after the radiotracer. CT data was obtained and used for attenuation correction and anatomic localization. Fasting blood glucose: 96 mg/dl COMPARISON:  Chest CT from March 06, 2020 FINDINGS: Mediastinal blood pool activity: SUV max 3.86 Liver activity: SUV max NA NECK: Diffuse increased metabolic activity at the base of tongue is relatively symmetric. Slightly asymmetric towards the LEFT in the submandibular gland some mild fullness but without discrete mass in this location (SUVmax = 11.5 Diffusely hypermetabolic thyroid gland (SUVmax = 12.4) Diffuse hypermetabolism at level 2 bilaterally adjacent to carotid vasculature particularly on the LEFT.) Incidental CT findings: none CHEST: Diffusely abnormal exam with respect to the chest with numerous areas of increased  metabolic activity. (Image 52, series 4) 14 mm RIGHT paratracheal lymph node (SUVmax = 14.4) Dominant mass in the LEFT upper lobe measuring approximately 4.6 x 4.6 cm (SUVmax = 25. Mass invades the extrapleural fat in the LEFT upper lobe in shows pleural involvement with circumferential metabolic activity tracking to the LEFT lung apex as exhibited on the recent diagnostic CT. Bi lateral hilar, anterior mediastinal and subcarinal adenopathy along with bilateral pulmonary nodules compatible with metastatic disease. Dominant lesion in the RIGHT chest measures 2.5 x 2.2 cm this shows some central cavitation suggesting some necrosis and displays less activity with some peripheral activity than other  areas within the chest. Diffuse pleural disease and signs of chest wall involvement track through the LEFT chest particularly posteriorly on image 74 of series 4 where there is overt chest wall and rib involvement of the LEFT posterior eighth rib (SUVmax = 16.5) Incidental CT findings: Calcified atheromatous plaque in the thoracic aorta. Three-vessel coronary artery disease. Heart size is normal. No pericardial fluid. ABDOMEN/PELVIS: Heterogeneity of hepatic uptake without discrete, focal hepatic lesion. Some areas on altered window wing suggested in the dome of the liver (image 89 of series 3 (SUVmax = 6.4) Focal antral uptake in the stomach with some uptake also in the mid stomach without PET correlate. Adrenal glands are normal. Perianal uptake, nonspecific. Incidental CT findings: No acute gastrointestinal process marked prostate heterogeneity and enlargement with variable uptake, nonspecific finding. (SUVmax = 5.3 but with diffusely variable uptake throughout the prostate. Calcified atheromatous plaque of the abdominal aorta extending in the iliac vessels.) SKELETON: Is signs of multifocal bony metastatic disease involving the sacrum, cervical, thoracic and lumbar spine as well as the RIGHT scapula. Multifocal rib  lesions also demonstrated in addition to direct involvement from pleural and chest wall disease in the upper LEFT chest. Bilateral pelvic metastatic lesions. These involve iliac pubic bones and sacral ala as discussed. C2 lesion (SUVmax = 7.1) T8 lesion (SUVmax = 7.5) L1 lesion (SUVmax = 11.5) Example of sacral involvement in the RIGHT sacrum (SUVmax = 12.2 (image 155, series 4) many of these lesions without definitive CT correlates. LEFT pubic bone, anteriorly in the inferior pubic ramus (SUVmax = 9.5) Example of rib lesion away from extensive pleural disease in the anterior LEFT chest involving the LEFT anterior second rib (SUVmax = 7.1 (image 63, series 4) multifocal rib lesions are seen bilaterally with similar appearance. Similar appearance of RIGHT scapular metastatic disease. Involvement of the cervical spine may be more extensive than suggested there is some motion which does limit assessment and there is variable uptake in the head in general as compared to the remainder of the exam. ) Incidental CT findings: Uptake along the costochondral margin (image 118, series 4) (SUVmax = 6.6 with potential costochondral fracture in this location) IMPRESSION: 1. Widespread metastatic disease in the chest and 2 multifocal areas within the spine, ribs and pelvis as well as RIGHT scapula. 2. Increased metabolic activity about the base of tongue bilaterally and potentially in cervical lymph nodes though discrete nodes are not well demonstrated. Some of this activity in the neck could be related to acquisition artifact as there is diffuse activity about the face as well which has no CT correlate. Primary neoplasm is also considered in this location. Direct visualization may be warranted. 3. Diffuse thyroid uptake correlate with thyroid function and sonographic assessment. 4. Direct chest wall extension and multifocal rib involvement from direct extension and metastases. 5. Heterogeneous prostate is a nonspecific finding  but with increased uptake would consider correlation with PSA for further assessment. 6. Nonspecific perianal uptake and gastric uptake, dedicated imaging of the abdomen and pelvis with diagnostic CT could potentially be of benefit to assess for any focal lesion, none seen on current PET though there is mild antral thickening. 7. Potential nondisplaced fracture of the costochondral junction inferiorly along the costal margin as described. These results will be called to the ordering clinician or representative by the Radiologist Assistant, and communication documented in the PACS or [employer]. Electronically Signed   By: Zetta Bills M.D.   On: 03/22/2020 16:41  IMPRESSION: Stage IV (T4, N3, M1c) non-small cell lung cancer, adenocarcinoma of the left upper lobe  Patient's brain MRI and PET scan were reviewed in detail.  The patient did not wish to see his images today.  At this time the patient denies any pain along the chest or rib cage area and therefore would not recommend palliative treatments to this region.  He also denies any pain along the lumbar spine or pelvic region where osseous metastasis are noted.  Patient would appear to be a good candidate for stereotactic radiosurgery for his 5 small brain lesions in the cerebellar area.  We discussed that the patient will require a 3T MRI prior to proceeding with this therapy.  Patient did meet with Mont Dutton the [facility] coordinator today and the patient will be scheduled for consultation with one of the [facility] physicians in the near future after his 3T MRI is complete    PLAN: Patient will proceed with his 3T MRI on Wednesday, July 28 at 940.  Scan is scheduled at [place].  He will then be evaluated for [facility] treatment for his cerebellar lesions.  Total time spent in this encounter was 50 minutes which included reviewing the patient's most recent imaging (chest x-ray, CT scan, PET scan, brain MRI), consultations, follow-ups,  chemotherapy, bronchoscopy, cytology report, physical examination, and documentation.  ------------------------------------------------  Blair Promise, PhD, MD  This document serves as a record of services personally performed by Gery Pray, MD. It was created on his behalf by Clerance Lav, a trained medical scribe. The creation of this record is based on the scribe's personal observations and the provider's statements to them. This document has been checked and approved by the attending provider.

## 2020-04-08 ENCOUNTER — Ambulatory Visit: Payer: Medicare PPO | Admitting: Radiation Oncology

## 2020-04-08 ENCOUNTER — Encounter: Payer: Self-pay | Admitting: *Deleted

## 2020-04-08 NOTE — Progress Notes (Addendum)
Initial Outpatient Consultation  Name: Phillip Franklin          MRN: [id number]         Date: 04/09/2020                      DOB: 12/23/1940  WU:[id number], Jori Moll, MD  Curt Bears, MD   REFERRING PHYSICIAN: Curt Bears, MD  DIAGNOSIS: There were no encounter diagnoses.  Stage IV (T4,N3, M1c)non-small cell lung cancer, adenocarcinoma of the left upper lobe  HISTORY OF PRESENT ILLNESS::Phillip Franklin is a 79 y.o. male who is seen as a courtesy of Dr Julien Nordmann for an opinion concerning radiation therapy as part of management for his recently diagnosed metastatic lung cancer. The patient presented to Dr. Delfina Redwood, PCP, on 02/21/2020 with complaints of weight loss and cough. Chest x-ray at that time showed a 5.3 x 3.6 cm left apex mass that was suspicious for pulmonary neoplasm. Additionally, bilateral hilar enlargement raised the question of adenopathy along with the numerous small nodules throughout both lungs, highly suspicious for metastatic disease. Finally, it showed underlying chronic bronchitic and interstitial lung disease changes.  CT scan of chest on 03/05/2020 showed a large mass in the apex of the left upper lobe with evidence of widespread metastatic disease throughout the lungs bilaterally, as well as pleural metastases in the left hemithorax and nodal disease in the mediastinal and hilar regions bilaterally. Findings likely reflected a primary left upper lobe bronchogenic carcinoma with metastatic disease.  The patient was referred to Dr. Julien Nordmann and was seen in consultation on 03/13/2020. At that time, it was recommended that the patient proceed with PET scan and brain MRI to complete the staging work-up.  PET scan on 03/20/2020 showed widespread metastatic disease in the chest and two multifocal areas within the spine, ribs, pelvis, right scapula. There was increased metabolic activity about the base of the tongue bilaterally and potentially in cervical lymph nodes, though  discrete nodes were not demonstrated. Some of the activity in the neck could be related to acquisition artifact, as there was diffuse activity about the face as well, which had no CT correlate. Primary neoplasm was also considered in that location. Additionally, there was diffuse thyroid uptake, direct chest wall extension and multifocal rib involvement from direct extension and metastases, an indeterminate heterogeneous prostate with increased uptake, non-specific perianal uptake and gastric uptake, mild antral thickening, and a potential non-displaced fracture of the costochondral junction inferiorly along the costal margin.  The patient underwent a flexible and EBUS bronchoscopy on 03/24/2020 that was performed by Dr. Tamala Julian. Cytology from the procedure revealed malignant cells consistent with adenocarcinoma.  MRI of brain on 03/27/2020 showed five cerebellar metastases, the largest being in the left cerebellum and measuring 8 mm in diameter. No mass effect or hemorrhage. There was also noted to be an incidental meningioma at the right lateral frontal convexity with a diameter of 2 cm and a thickness of 9 mm; no mass-effect upon the brain. There were no metastases identified in either cerebral hemispheres.   The patient was last seen by Dr. Julien Nordmann on 03/30/2020, during which time they discussed different options including palliative care and hospice referral versus palliative systemic chemotherapy. The patient opted for systemic chemotherapy with Carboplatin, Alimta, and Keytruda. First dose was given on 04/06/2020. Molecular studies were negative for actionable mutation. The patient was then referred to radiation oncology for consideration of stereotactic radiosurgery to the new brain metastasis as well as the painful bone lesions.  PREVIOUS RADIATION THERAPY: No  PAST MEDICAL HISTORY:      Past Medical History:  Diagnosis Date  . Depression   . GERD (gastroesophageal reflux disease)     occasional, diet controlled  . High cholesterol   . Hypertension    no longer taking medication - states it's undercontrol  . Hypothyroid     PAST SURGICAL HISTORY:      Past Surgical History:  Procedure Laterality Date  . BRONCHIAL NEEDLE ASPIRATION BIOPSY  03/24/2020   Procedure: BRONCHIAL NEEDLE ASPIRATION BIOPSIES;  Surgeon: Candee Furbish, MD;  Location: [facility] ENDOSCOPY;  Service: Pulmonary;;  . HEMORRHOID SURGERY    . VIDEO BRONCHOSCOPY WITH ENDOBRONCHIAL ULTRASOUND N/A 03/24/2020   Procedure: VIDEO BRONCHOSCOPY WITH ENDOBRONCHIAL ULTRASOUND;  Surgeon: Candee Furbish, MD;  Location: [facility] ENDOSCOPY;  Service: Pulmonary;  Laterality: N/A;    FAMILY HISTORY:       Family History  Problem Relation Age of Onset  . Heart disease Father     SOCIAL HISTORY:  Social History        Tobacco Use  . Smoking status: Former Smoker    Types: Cigarettes, Pipe    Quit date: 11/13/1977    Years since quitting: 42.4  . Smokeless tobacco: Never Used  Vaping Use  . Vaping Use: Never used  Substance Use Topics  . Alcohol use: No  . Drug use: No     Past/Anticipated interventions by cardiothoracic surgery, if any: no  Past/Anticipated interventions by medical oncology, if any: having chemo now  Signs/Symptoms   Weight changes, if any: lost 20 lbs in 3 months  Respiratory complaints, if any: shortness of breath,  Coughing, non productive.  Hemoptysis, if any: after bx only  Pain issues, if any: no  SAFETY ISSUES:  Prior radiation? no  Pacemaker/ICD? no  Possible current pregnancy? n/a  Is the patient on methotrexate? no  Current Complaints / other details:  Has calcification on his right aorta. Pt' s wife has accompanied him today. Declined social work referral due to some home issues are very temporary. Patient is very  Hoarse from coughing.   BP (!) 167/75 (BP Location: Left Arm, Patient Position: Sitting, Cuff Size: Normal)   Pulse (!) 106    Temp 98.7 F (37.1 C)   Resp (!) 24   Ht 5\' 8"  (1.727 m)   Wt 164 lb 6.4 oz (74.6 kg)   SpO2 100%   BMI 25.00 kg/m   Wt Readings from Last 3 Encounters:  04/09/20 164 lb 6.4 oz (74.6 kg)  04/06/20 156 lb 8 oz (71 kg)  03/30/20 159 lb 8 oz (72.3 kg)    wt

## 2020-04-08 NOTE — Progress Notes (Signed)
Per Dr. Julien Nordmann, I called [facility] to inquire about Mr. Maneri cancelled test.  I was updated there is not enough tissue for [facility] or [facility].  I asked that they fax this information to me.  I updated Dr. Julien Nordmann.

## 2020-04-09 ENCOUNTER — Other Ambulatory Visit: Payer: Self-pay | Admitting: Internal Medicine

## 2020-04-09 ENCOUNTER — Ambulatory Visit
Admission: RE | Admit: 2020-04-09 | Discharge: 2020-04-09 | Disposition: A | Discharge status: Home / Self Care | Payer: Medicare PPO | Source: Ambulatory Visit | Attending: Radiation Oncology | Admitting: Radiation Oncology

## 2020-04-09 ENCOUNTER — Other Ambulatory Visit: Payer: Self-pay

## 2020-04-09 ENCOUNTER — Other Ambulatory Visit: Payer: Self-pay | Admitting: Radiation Therapy

## 2020-04-09 ENCOUNTER — Encounter: Payer: Self-pay | Admitting: Radiation Oncology

## 2020-04-09 ENCOUNTER — Telehealth: Payer: Self-pay | Admitting: *Deleted

## 2020-04-09 DIAGNOSIS — Z79899 Other long term (current) drug therapy: Secondary | ICD-10-CM | POA: Diagnosis not present

## 2020-04-09 DIAGNOSIS — C7931 Secondary malignant neoplasm of brain: Secondary | ICD-10-CM | POA: Insufficient documentation

## 2020-04-09 DIAGNOSIS — C7802 Secondary malignant neoplasm of left lung: Secondary | ICD-10-CM | POA: Insufficient documentation

## 2020-04-09 DIAGNOSIS — K219 Gastro-esophageal reflux disease without esophagitis: Secondary | ICD-10-CM | POA: Diagnosis not present

## 2020-04-09 DIAGNOSIS — C7801 Secondary malignant neoplasm of right lung: Secondary | ICD-10-CM | POA: Insufficient documentation

## 2020-04-09 DIAGNOSIS — Z87891 Personal history of nicotine dependence: Secondary | ICD-10-CM | POA: Diagnosis not present

## 2020-04-09 DIAGNOSIS — Z7982 Long term (current) use of aspirin: Secondary | ICD-10-CM | POA: Insufficient documentation

## 2020-04-09 DIAGNOSIS — C7951 Secondary malignant neoplasm of bone: Secondary | ICD-10-CM | POA: Diagnosis not present

## 2020-04-09 DIAGNOSIS — C3492 Malignant neoplasm of unspecified part of left bronchus or lung: Secondary | ICD-10-CM

## 2020-04-09 DIAGNOSIS — E039 Hypothyroidism, unspecified: Secondary | ICD-10-CM | POA: Diagnosis not present

## 2020-04-09 DIAGNOSIS — I10 Essential (primary) hypertension: Secondary | ICD-10-CM | POA: Insufficient documentation

## 2020-04-09 DIAGNOSIS — C782 Secondary malignant neoplasm of pleura: Secondary | ICD-10-CM | POA: Insufficient documentation

## 2020-04-09 DIAGNOSIS — F329 Major depressive disorder, single episode, unspecified: Secondary | ICD-10-CM | POA: Diagnosis not present

## 2020-04-09 DIAGNOSIS — E7800 Pure hypercholesterolemia, unspecified: Secondary | ICD-10-CM | POA: Insufficient documentation

## 2020-04-09 DIAGNOSIS — C3412 Malignant neoplasm of upper lobe, left bronchus or lung: Secondary | ICD-10-CM | POA: Diagnosis not present

## 2020-04-09 NOTE — Telephone Encounter (Signed)
Per Dr. Julien Nordmann, I called Phillip Franklin to update him regarding cancer conference discussion.  I called but was unable to reach him.  I did leave a vm message updating on cancer conference and asked that he call the cancer center to speak with Dr. Julien Nordmann for more information.

## 2020-04-10 ENCOUNTER — Other Ambulatory Visit: Payer: Self-pay | Admitting: Physician Assistant

## 2020-04-10 ENCOUNTER — Encounter (HOSPITAL_COMMUNITY): Payer: Self-pay

## 2020-04-10 DIAGNOSIS — C3492 Malignant neoplasm of unspecified part of left bronchus or lung: Secondary | ICD-10-CM

## 2020-04-10 NOTE — Progress Notes (Unsigned)
Phillip Franklin Male, 78 y.o., 11/08/1940 MRN:  [id number] Phone:  [phone] (H) PCP:  Seward Carol, MD Coverage:  [facility] Medicare/[employer] Medicare Choice Ppo Next Appt With Radiology (GI-315 MR 3) 04/15/2020 at 9:40 AM  RE: Biopsy Received: Today Message Details  Corrie Mckusick, DO  [facility] [place] for CT guided lung mass biopsy.    Discussed at thoracic tumor board. Dr. Julien Nordmann requesting tissue for 3rd party testing.    [name]   Previous Messages  ----- Message -----  From: Lenore Cordia  Sent: 04/10/2020  9:30 AM EDT  To: Corrie Mckusick, DO  Subject: Biopsy                      Procedure Requested: CT-guided core biopsy    Reason for Procedure: of the most accessible hypermetabolic lung mass for molecular studies.    Provider Requesting: Curt Bears  Provider Telephone: [phone]    Other Info:

## 2020-04-13 ENCOUNTER — Inpatient Hospital Stay (HOSPITAL_BASED_OUTPATIENT_CLINIC_OR_DEPARTMENT_OTHER): Payer: Medicare PPO | Admitting: Internal Medicine

## 2020-04-13 ENCOUNTER — Other Ambulatory Visit: Payer: Self-pay | Admitting: Internal Medicine

## 2020-04-13 ENCOUNTER — Encounter: Payer: Self-pay | Admitting: Internal Medicine

## 2020-04-13 ENCOUNTER — Encounter: Payer: Self-pay | Admitting: *Deleted

## 2020-04-13 ENCOUNTER — Other Ambulatory Visit: Payer: Self-pay | Admitting: Radiation Oncology

## 2020-04-13 ENCOUNTER — Inpatient Hospital Stay: Payer: Medicare PPO

## 2020-04-13 ENCOUNTER — Other Ambulatory Visit: Payer: Self-pay

## 2020-04-13 VITALS — BP 131/70 | HR 91 | Temp 98.2°F | Resp 18 | Ht 68.0 in | Wt 154.8 lb

## 2020-04-13 DIAGNOSIS — Z5111 Encounter for antineoplastic chemotherapy: Secondary | ICD-10-CM | POA: Diagnosis not present

## 2020-04-13 DIAGNOSIS — C3492 Malignant neoplasm of unspecified part of left bronchus or lung: Secondary | ICD-10-CM

## 2020-04-13 DIAGNOSIS — E039 Hypothyroidism, unspecified: Secondary | ICD-10-CM | POA: Diagnosis not present

## 2020-04-13 DIAGNOSIS — Z5112 Encounter for antineoplastic immunotherapy: Secondary | ICD-10-CM | POA: Diagnosis not present

## 2020-04-13 DIAGNOSIS — R634 Abnormal weight loss: Secondary | ICD-10-CM | POA: Diagnosis not present

## 2020-04-13 DIAGNOSIS — C7951 Secondary malignant neoplasm of bone: Secondary | ICD-10-CM | POA: Diagnosis not present

## 2020-04-13 DIAGNOSIS — Z87891 Personal history of nicotine dependence: Secondary | ICD-10-CM | POA: Diagnosis not present

## 2020-04-13 DIAGNOSIS — C7931 Secondary malignant neoplasm of brain: Secondary | ICD-10-CM | POA: Diagnosis not present

## 2020-04-13 DIAGNOSIS — R05 Cough: Secondary | ICD-10-CM | POA: Diagnosis not present

## 2020-04-13 DIAGNOSIS — C3490 Malignant neoplasm of unspecified part of unspecified bronchus or lung: Secondary | ICD-10-CM | POA: Diagnosis not present

## 2020-04-13 LAB — CBC WITH DIFFERENTIAL (CANCER CENTER ONLY)
Abs Immature Granulocytes: 0 10*3/uL (ref 0.00–0.07)
Basophils Absolute: 0 10*3/uL (ref 0.0–0.1)
Basophils Relative: 0 %
Eosinophils Absolute: 0.1 10*3/uL (ref 0.0–0.5)
Eosinophils Relative: 4 %
HCT: 44.2 % (ref 39.0–52.0)
Hemoglobin: 14 g/dL (ref 13.0–17.0)
Immature Granulocytes: 0 %
Lymphocytes Relative: 45 %
Lymphs Abs: 1 10*3/uL (ref 0.7–4.0)
MCH: 27.9 pg (ref 26.0–34.0)
MCHC: 31.7 g/dL (ref 30.0–36.0)
MCV: 88.2 fL (ref 80.0–100.0)
Monocytes Absolute: 0.1 10*3/uL (ref 0.1–1.0)
Monocytes Relative: 4 %
Neutro Abs: 1 10*3/uL — ABNORMAL LOW (ref 1.7–7.7)
Neutrophils Relative %: 47 %
Platelet Count: 236 10*3/uL (ref 150–400)
RBC: 5.01 MIL/uL (ref 4.22–5.81)
RDW: 14.3 % (ref 11.5–15.5)
WBC Count: 2.3 10*3/uL — ABNORMAL LOW (ref 4.0–10.5)
nRBC: 0 % (ref 0.0–0.2)

## 2020-04-13 LAB — CMP (CANCER CENTER ONLY)
ALT: 46 U/L — ABNORMAL HIGH (ref 0–44)
AST: 36 U/L (ref 15–41)
Albumin: 2.9 g/dL — ABNORMAL LOW (ref 3.5–5.0)
Alkaline Phosphatase: 151 U/L — ABNORMAL HIGH (ref 38–126)
Anion gap: 11 (ref 5–15)
BUN: 17 mg/dL (ref 8–23)
CO2: 24 mmol/L (ref 22–32)
Calcium: 10.6 mg/dL — ABNORMAL HIGH (ref 8.9–10.3)
Chloride: 103 mmol/L (ref 98–111)
Creatinine: 1.41 mg/dL — ABNORMAL HIGH (ref 0.61–1.24)
GFR, Est AFR Am: 54 mL/min — ABNORMAL LOW (ref >60)
GFR, Estimated: 47 mL/min — ABNORMAL LOW (ref >60)
Glucose, Bld: 121 mg/dL — ABNORMAL HIGH (ref 70–99)
Potassium: 3.7 mmol/L (ref 3.5–5.1)
Sodium: 138 mmol/L (ref 135–145)
Total Bilirubin: 0.8 mg/dL (ref 0.3–1.2)
Total Protein: 7.8 g/dL (ref 6.5–8.1)

## 2020-04-13 LAB — TSH: TSH: 5.131 u[IU]/mL — ABNORMAL HIGH (ref 0.320–4.118)

## 2020-04-13 MED ORDER — FLUCONAZOLE 100 MG PO TABS
100.0000 mg | ORAL_TABLET | Freq: Every day | ORAL | 0 refills | Status: DC
Start: 2020-04-13 — End: 2020-04-27

## 2020-04-13 NOTE — Progress Notes (Signed)
[facility] Psychosocial Distress Screening Clinical Social Work  Clinical Social Work was referred by distress screening protocol.  The patient scored a 8 on the Psychosocial Distress Thermometer which indicates moderate distress. Clinical Social Worker has already established contact with patient and patients wife for previous referral.  Patient declined social work follow up at this time.     ONCBCN DISTRESS SCREENING 04/09/2020  Distress experienced in past week (1-10) 8  Referral to clinical social work No     Johnnye Lana, MSW, [place], OSW-C Clinical Social Worker [facility] [phone]

## 2020-04-13 NOTE — Progress Notes (Signed)
Went to tx room to introduce myself to patient/accompanying adult as Arboriculturist and to offer available resources.  Discussed one-time $1000 Radio broadcast assistant to assist with personal expenses while going through treatemnt. Based on verbal income guidelines, they exceed the income for the grant.  Gave my card for any additional financial questions or concerns.

## 2020-04-13 NOTE — Progress Notes (Signed)
I spoke with patient and his wife today during their visit with Dr. Julien Nordmann.  He has a lot of appt coming up and I was able to print his schedule and explain.

## 2020-04-13 NOTE — Progress Notes (Signed)
Phillip Franklin [phone]   [phone]  OFFICE PROGRESS NOTE  Seward Carol, MD [address] [place] [address]  DIAGNOSIS: Stage IV (T4, N3, M1c) non-small cell lung cancer, adenocarcinoma presented with left upper lobe lung mass in addition to bilateral mediastinal lymphadenopathy as well as bilateral pulmonary nodules and metastatic disease to the bones and brain diagnosed in July 2021.  Molecular studies by [facility] showed no actionable mutations.  PRIOR THERAPY: None  CURRENT THERAPY: Systemic chemotherapy with carboplatin for AUC of 5, Alimta 500 mg/M2 and Keytruda 200 mg IV every 3 weeks.  First dose 04/06/2020.  Status post 1 cycle.  INTERVAL HISTORY: Phillip Franklin 79 y.o. male returns to the clinic today for follow-up visit accompanied by his wife and his daughter [name] who was available by FaceTime during the visit.  The patient started the first cycle of his treatment last week.  He felt much better the day after the treatment likely secondary to the steroid premedication.  He has a little bit more fatigue and still depressed several days after his treatment.  He does not leave home frequently these days.  He denied having any current nausea, vomiting, diarrhea or constipation.  He denied having any headache or visual changes.  He has no chest pain but continues to have dry cough with mild shortness of breath and no hemoptysis.  [name] currently on treatment with Remeron as well as Marinol for lack of appetite but the patient has not noticed any improvement yet.  He lost few pounds since his last visit.  His molecular studies by [facility] showed no actionable mutations.  We also [facility] for molecular studies but there was insufficient material for testing.  The patient was also referred to Dr. Sondra Come for evaluation and recommendation regarding the brain metastases as well as the bone painful  lesions.  Is here today for evaluation and repeat blood work.  MEDICAL HISTORY: Past Medical History:  Diagnosis Date   Depression    GERD (gastroesophageal reflux disease)    occasional, diet controlled   High cholesterol    Hypertension    no longer taking medication - states it's undercontrol   Hypothyroid     ALLERGIES:  is allergic to codeine, hydrocodone, oxycodone, and penicillins.  MEDICATIONS:  Current Outpatient Medications  Medication Sig Dispense Refill   acetaminophen (TYLENOL) 325 MG tablet Take 325-650 mg by mouth every 6 (six) hours as needed for moderate pain or headache.     Ascorbic Acid (VITAMIN C ADULT GUMMIES PO) Take 500-1,000 mg by mouth daily.     aspirin 81 MG tablet Take 81 mg by mouth every other day.      atorvastatin (LIPITOR) 20 MG tablet Take 20 mg by mouth daily.     benzonatate (TESSALON) 200 MG capsule Take 1 capsule by mouth three times daily as needed for cough 20 capsule 0   Biotin (BIOTIN 5000) 5 MG CAPS Take 5 mg by mouth every evening.     Camphor-Eucalyptus-Menthol (VICKS VAPORUB EX) Apply 1 application topically daily as needed (congestion).     Cholecalciferol (DIALYVITE VITAMIN D 5000) 125 MCG (5000 UT) capsule Take 5,000 Units by mouth daily.     Coenzyme Q10 (COQ-10) 100 MG CAPS Take 100 mg by mouth every evening.     dronabinol (MARINOL) 2.5 MG capsule Take 1 capsule (2.5 mg total) by mouth 2 (two)  times daily before lunch and supper. 60 capsule 0   esomeprazole (NEXIUM) 20 MG capsule Take 20 mg by mouth daily as needed (acid reflux).     folic acid (FOLVITE) 1 MG tablet Take 1 tablet (1 mg total) by mouth daily. 30 tablet 4   levothyroxine (SYNTHROID) 88 MCG tablet Take 88 mcg by mouth daily before breakfast.     LUTEIN PO Take 25 mg by mouth every evening.     Magnesium 400 MG CAPS Take 400 mg by mouth every evening.     Menthol, Topical Analgesic, (BENGAY EX) Apply 1 application topically daily as needed (pain).      Menthol-Camphor (SOMBRA NATURAL PAIN RELIEVING EX) Apply 1 application topically daily as needed (pain).     mirtazapine (REMERON) 30 MG tablet Take 1 tablet (30 mg total) by mouth at bedtime. 30 tablet 2   OVER THE COUNTER MEDICATION Take 15 mLs by mouth at bedtime. Colsys oral rinse     polyethylene glycol (MIRALAX) packet Take 17 g by mouth daily. (Patient taking differently: Take 17 g by mouth daily as needed for mild constipation. ) 14 each 0   prochlorperazine (COMPAZINE) 10 MG tablet Take 1 tablet (10 mg total) by mouth every 6 (six) hours as needed for nausea or vomiting. 30 tablet 0   Saw Palmetto, Serenoa repens, (SAW PALMETTO PO) Take 1 capsule by mouth every evening.     tamsulosin (FLOMAX) 0.4 MG CAPS capsule Take 0.4 mg by mouth every evening.      No current facility-administered medications for this visit.    SURGICAL HISTORY:  Past Surgical History:  Procedure Laterality Date   BRONCHIAL NEEDLE ASPIRATION BIOPSY  03/24/2020   Procedure: BRONCHIAL NEEDLE ASPIRATION BIOPSIES;  Surgeon: Candee Furbish, MD;  Location: [facility] ENDOSCOPY;  Service: Pulmonary;;   HEMORRHOID SURGERY     VIDEO BRONCHOSCOPY WITH ENDOBRONCHIAL ULTRASOUND N/A 03/24/2020   Procedure: VIDEO BRONCHOSCOPY WITH ENDOBRONCHIAL ULTRASOUND;  Surgeon: Candee Furbish, MD;  Location: [facility] ENDOSCOPY;  Service: Pulmonary;  Laterality: N/A;    REVIEW OF SYSTEMS:  Constitutional: positive for anorexia, fatigue and weight loss Eyes: negative Ears, nose, mouth, throat, and face: negative Respiratory: positive for cough Cardiovascular: negative Gastrointestinal: negative Genitourinary:negative Integument/breast: negative Hematologic/lymphatic: negative Musculoskeletal:negative Neurological: negative Behavioral/Psych: positive for depression Endocrine: negative Allergic/Immunologic: negative   PHYSICAL EXAMINATION: General appearance: alert, cooperative, fatigued and no distress Head: Normocephalic,  without obvious abnormality, atraumatic Neck: no adenopathy, no JVD, supple, symmetrical, trachea midline and thyroid not enlarged, symmetric, no tenderness/mass/nodules Lymph nodes: Cervical, supraclavicular, and axillary nodes normal. Resp: clear to auscultation bilaterally Back: symmetric, no curvature. ROM normal. No CVA tenderness. Cardio: regular rate and rhythm, S1, S2 normal, no murmur, click, rub or gallop GI: soft, non-tender; bowel sounds normal; no masses,  no organomegaly Extremities: extremities normal, atraumatic, no cyanosis or edema Neurologic: Alert and oriented X 3, normal strength and tone. Normal symmetric reflexes. Normal coordination and gait  ECOG PERFORMANCE STATUS: 1 - Symptomatic but completely ambulatory  Blood pressure (!) 131/70, pulse 91, temperature 98.2 F (36.8 C), temperature source Temporal, resp. rate 18, height 5\' 8"  (1.727 m), weight 154 lb 12.8 oz (70.2 kg), SpO2 97 %.  LABORATORY DATA: Lab Results  Component Value Date   WBC 2.3 (L) 04/13/2020   HGB 14.0 04/13/2020   HCT 44.2 04/13/2020   MCV 88.2 04/13/2020   PLT 236 04/13/2020      Chemistry      Component Value Date/Time   NA  141 04/06/2020 1020   K 3.5 04/06/2020 1020   CL 108 04/06/2020 1020   CO2 22 04/06/2020 1020   BUN 14 04/06/2020 1020   CREATININE 1.48 (H) 04/06/2020 1020      Component Value Date/Time   CALCIUM 10.4 (H) 04/06/2020 1020   ALKPHOS 175 (H) 04/06/2020 1020   AST 31 04/06/2020 1020   ALT 41 04/06/2020 1020   BILITOT 0.5 04/06/2020 1020       RADIOGRAPHIC STUDIES: MR BRAIN W WO CONTRAST  Result Date: 03/29/2020 CLINICAL DATA:  Staging non-small cell lung cancer. EXAM: MRI HEAD WITHOUT AND WITH CONTRAST TECHNIQUE: Multiplanar, multiecho pulse sequences of the brain and surrounding structures were obtained without and with intravenous contrast. CONTRAST:  61mL GADAVIST GADOBUTROL 1 MMOL/ML IV SOLN COMPARISON:  Head CT 08/29/2011 FINDINGS: Brain: Diffusion  imaging does not show any acute or subacute infarction or other cause of restricted diffusion. 3 mm metastasis right cerebellum axial image 49. 3.5 mm metastasis left cerebellum axial image 33. 8 mm metastasis left cerebellum axial image 39. Possible punctate metastasis posterior left cerebellum axial image 41. Punctate metastasis left vermis axial image 44. No metastases identified in either cerebral hemisphere. Meningioma at the right frontal lateral convexity with a diameter of 2 cm and a thickness of 9 mm. No mass-effect upon the brain. Minimal small vessel change of the hemispheric white matter. No cortical or large vessel territory infarction. No hydrocephalus or extra-axial collection. Vascular: Major vessels at the base of the brain show flow. Skull and upper cervical spine: Negative Sinuses/Orbits: Clear/normal Other: None IMPRESSION: Five cerebellar metastases identified as described above. The largest is in the left cerebellum measuring 8 mm in diameter. No mass effect or hemorrhage. No metastases identified in either cerebral hemisphere. Incidental meningioma at the right lateral frontal convexity with a diameter of 2 cm and a thickness of 9 mm. No mass-effect upon the brain. Electronically Signed   By: Nelson Chimes M.D.   On: 03/29/2020 14:46   NM PET Image Initial (PI) Skull Base To Thigh  Result Date: 03/22/2020 CLINICAL DATA:  Initial treatment strategy for non-small cell lung cancer staging. EXAM: NUCLEAR MEDICINE PET SKULL BASE TO THIGH TECHNIQUE: 8.86 mCi F-18 FDG was injected intravenously. Full-ring PET imaging was performed from the skull base to thigh after the radiotracer. CT data was obtained and used for attenuation correction and anatomic localization. Fasting blood glucose: 96 mg/dl COMPARISON:  Chest CT from March 06, 2020 FINDINGS: Mediastinal blood pool activity: SUV max 3.86 Liver activity: SUV max NA NECK: Diffuse increased metabolic activity at the base of tongue is relatively  symmetric. Slightly asymmetric towards the LEFT in the submandibular gland some mild fullness but without discrete mass in this location (SUVmax = 11.5 Diffusely hypermetabolic thyroid gland (SUVmax = 12.4) Diffuse hypermetabolism at level 2 bilaterally adjacent to carotid vasculature particularly on the LEFT.) Incidental CT findings: none CHEST: Diffusely abnormal exam with respect to the chest with numerous areas of increased metabolic activity. (Image 52, series 4) 14 mm RIGHT paratracheal lymph node (SUVmax = 14.4) Dominant mass in the LEFT upper lobe measuring approximately 4.6 x 4.6 cm (SUVmax = 25. Mass invades the extrapleural fat in the LEFT upper lobe in shows pleural involvement with circumferential metabolic activity tracking to the LEFT lung apex as exhibited on the recent diagnostic CT. Bi lateral hilar, anterior mediastinal and subcarinal adenopathy along with bilateral pulmonary nodules compatible with metastatic disease. Dominant lesion in the RIGHT chest measures 2.5 x 2.2  cm this shows some central cavitation suggesting some necrosis and displays less activity with some peripheral activity than other areas within the chest. Diffuse pleural disease and signs of chest wall involvement track through the LEFT chest particularly posteriorly on image 74 of series 4 where there is overt chest wall and rib involvement of the LEFT posterior eighth rib (SUVmax = 16.5) Incidental CT findings: Calcified atheromatous plaque in the thoracic aorta. Three-vessel coronary artery disease. Heart size is normal. No pericardial fluid. ABDOMEN/PELVIS: Heterogeneity of hepatic uptake without discrete, focal hepatic lesion. Some areas on altered window wing suggested in the dome of the liver (image 89 of series 3 (SUVmax = 6.4) Focal antral uptake in the stomach with some uptake also in the mid stomach without PET correlate. Adrenal glands are normal. Perianal uptake, nonspecific. Incidental CT findings: No acute  gastrointestinal process marked prostate heterogeneity and enlargement with variable uptake, nonspecific finding. (SUVmax = 5.3 but with diffusely variable uptake throughout the prostate. Calcified atheromatous plaque of the abdominal aorta extending in the iliac vessels.) SKELETON: Is signs of multifocal bony metastatic disease involving the sacrum, cervical, thoracic and lumbar spine as well as the RIGHT scapula. Multifocal rib lesions also demonstrated in addition to direct involvement from pleural and chest wall disease in the upper LEFT chest. Bilateral pelvic metastatic lesions. These involve iliac pubic bones and sacral ala as discussed. C2 lesion (SUVmax = 7.1) T8 lesion (SUVmax = 7.5) L1 lesion (SUVmax = 11.5) Example of sacral involvement in the RIGHT sacrum (SUVmax = 12.2 (image 155, series 4) many of these lesions without definitive CT correlates. LEFT pubic bone, anteriorly in the inferior pubic ramus (SUVmax = 9.5) Example of rib lesion away from extensive pleural disease in the anterior LEFT chest involving the LEFT anterior second rib (SUVmax = 7.1 (image 63, series 4) multifocal rib lesions are seen bilaterally with similar appearance. Similar appearance of RIGHT scapular metastatic disease. Involvement of the cervical spine may be more extensive than suggested there is some motion which does limit assessment and there is variable uptake in the head in general as compared to the remainder of the exam. ) Incidental CT findings: Uptake along the costochondral margin (image 118, series 4) (SUVmax = 6.6 with potential costochondral fracture in this location) IMPRESSION: 1. Widespread metastatic disease in the chest and 2 multifocal areas within the spine, ribs and pelvis as well as RIGHT scapula. 2. Increased metabolic activity about the base of tongue bilaterally and potentially in cervical lymph nodes though discrete nodes are not well demonstrated. Some of this activity in the neck could be related  to acquisition artifact as there is diffuse activity about the face as well which has no CT correlate. Primary neoplasm is also considered in this location. Direct visualization may be warranted. 3. Diffuse thyroid uptake correlate with thyroid function and sonographic assessment. 4. Direct chest wall extension and multifocal rib involvement from direct extension and metastases. 5. Heterogeneous prostate is a nonspecific finding but with increased uptake would consider correlation with PSA for further assessment. 6. Nonspecific perianal uptake and gastric uptake, dedicated imaging of the abdomen and pelvis with diagnostic CT could potentially be of benefit to assess for any focal lesion, none seen on current PET though there is mild antral thickening. 7. Potential nondisplaced fracture of the costochondral junction inferiorly along the costal margin as described. These results will be called to the ordering clinician or representative by the Radiologist Assistant, and communication documented in the PACS or [employer].  Electronically Signed   By: Zetta Bills M.D.   On: 03/22/2020 16:41    ASSESSMENT AND PLAN: This is a very pleasant 79 years old white male recently diagnosed with a stage IV (T4, N3, M1C) non-small cell lung cancer, adenocarcinoma presented with left upper lobe lung mass in addition to bilateral pulmonary nodules as well as bilateral hilar and mediastinal lymphadenopathy and metastatic disease to the bones and brain diagnosed in July 2021. Unfortunately the molecular studies by [facility] was negative for actionable mutations.  The initial tissue biopsy was insufficient for molecular studies by [facility]. I recommended for the patient to have repeat CT-guided core biopsy of the left upper lobe lung mass for molecular studies. In the meantime the patient is currently undergoing systemic chemotherapy with carboplatin for AUC of 5, Alimta 500 mg/M2 and Keytruda 200 mg IV every 3  weeks status post 1 cycle started last week. I recommended for the patient to continue the current treatment for now unless the molecular studies showed any actionable mutations and will consider switching his treatment. For the lack of appetite and weight loss, he is currently on Remeron as well as Marinol.  It may take few weeks to notice the effect of these drugs. For the dry cough, he will continue on Tessalon Perles. For the metastatic brain lesion, the patient scheduled to have repeat MRI of the brain and he is expected to have SRS to the metastatic lesions under the care of Dr. Sondra Come and Dr. Isidore Moos. He will come back for follow-up visit in 2 weeks for evaluation before starting cycle #2 of his treatment. He was advised to call immediately if he has any concerning symptoms in the interval.  Disclaimer: This note was dictated with voice recognition software. Similar sounding words can inadvertently be transcribed and may not be corrected upon review.

## 2020-04-14 ENCOUNTER — Other Ambulatory Visit: Payer: Self-pay | Admitting: Internal Medicine

## 2020-04-14 ENCOUNTER — Telehealth: Payer: Self-pay | Admitting: *Deleted

## 2020-04-14 NOTE — Telephone Encounter (Signed)
Patients wife called today to report complaint.  Wife states he is complaining of left chest pain/below nipple when deep breathes and cough.  Patient was seen yesterday.  He reported then a dry cough that he is using Best [employer] for.  Patient doesn't want to use anything stronger than Tylenol.    Encouraged patient to try over the counter cough medication.  Pain could be a result of coughing. Wife was advised if symptom continues to persist, please let our office know.

## 2020-04-15 ENCOUNTER — Telehealth: Payer: Self-pay | Admitting: *Deleted

## 2020-04-15 ENCOUNTER — Other Ambulatory Visit: Payer: Medicare PPO

## 2020-04-15 NOTE — Telephone Encounter (Signed)
Pt wife [name], left vm stating pt mood has changed, yelling for hours and using foul language, refusing to eat. Returned pt wife called but no answer. Left vm advising to call office and if husband condition worsens to go to ED.

## 2020-04-16 ENCOUNTER — Encounter: Payer: Self-pay | Admitting: Internal Medicine

## 2020-04-16 ENCOUNTER — Ambulatory Visit: Payer: Self-pay | Admitting: *Deleted

## 2020-04-16 ENCOUNTER — Telehealth: Payer: Self-pay | Admitting: *Deleted

## 2020-04-16 LAB — GUARDANT 360

## 2020-04-16 NOTE — Progress Notes (Signed)
Histology and Location of Primary Cancer:  Stage IV (T4,N3, M1c)non-small cell lung cancer, adenocarcinoma of the LEFT upper lobe  Sites of Visceral and Bony Metastatic Disease:  MRI of brain on 03/27/2020 showed five cerebellar metastases, the largest being in the left cerebellum and measuring 8 mm in diameter. There was also noted to be an incidental meningioma at the right lateral frontal convexity with a diameter of 2 cm and a thickness of 9 mm  Location(s) of Symptomatic Metastases:  PET scan on 03/20/2020 showed widespread metastatic disease in the chest and two multifocal areas within the spine, ribs, pelvis, right scapula. There was increased metabolic activity about the base of the tongue bilaterally and potentially in cervical lymph nodes (though patient told Dr. Gery Pray that none of these sites bothered him)  Past/Anticipated chemotherapy by medical oncology, if any:  Under care of Dr. Curt Bears 04/13/2020 -Systemic chemotherapy with carboplatin for AUC of 5, Alimta 500 mg/M2 and Keytruda 200 mg IV every 3 weeks.  First dose 04/06/2020. -I recommended for the patient to have repeat CT-guided core biopsy of the left upper lobe lung mass for molecular studies. -I recommended for the patient to continue the current treatment for now unless the molecular studies showed any actionable mutations and will consider switching his treatment. -He will come back for follow-up visit in 2 weeks for evaluation before starting cycle #2 of his treatment.  Pain on a scale of 0-10 is: Patient and wife denies   Recent neurologic symptoms, if any:   Seizures: None  Headaches: None  Nausea: None but per wife patient is not interested in eating, and has to be encouraged frequently to eat/drink  Dizziness/ataxia: Sometimes unsteady on his feet when walking (per wife)  Difficulty with hand coordination: None  Focal numbness/weakness: Patient is very tired and weak all the time  Visual  deficits/changes: None  Confusion/Memory deficits: Wife reports he seems to have trouble remembering short term events (like appointments and daily occurrences)  Ambulatory status? Walker? Wheelchair?: Able to get around the house unassisted, but uses wheelchair for appointments/longer outings  SAFETY ISSUES:  Prior radiation? No  Pacemaker/ICD? No  Possible current pregnancy? N/A  Is the patient on methotrexate? No  Current Complaints / other details:  Wife reports a rash on neck and trunk that they are managing with hydrocortisone cream.

## 2020-04-16 NOTE — Telephone Encounter (Signed)
Patient's wife called for advise only. Patient's wife reports patient has had rash to neck x 1 week and is treating with Hydrocortisone cream per MD orders. Now she has noticed rash on back and trunk above waist. Reports of mild rash due to receiving immunotherapy due to lung CA. Patient's wife is asking if applying hydrocortisone will interfere with patient having MRI tomorrow. Care advise given and to continue to have MRI as planned tomorrow. Patient's wife verbalized understanding of care advise and to call back as needed.  Reason for Disposition  Question about upcoming scheduled test, no triage required and triager able to answer question  Answer Assessment - Initial Assessment Questions 1. REASON FOR CALL or QUESTION: "What is your reason for calling today?" or "How can I best help you?" or "What question do you have that I can help answer?"     Can my husband have MRI tomorrow since I have been applying hydrocortisone cream to mild rash on neck, trunk and back?  Protocols used: INFORMATION ONLY CALL - NO TRIAGE-A-AH

## 2020-04-16 NOTE — Progress Notes (Signed)
Radiation Oncology         [phone]) [phone] ________________________________  Outpatient Follow Up New - by telephone.  The patient opted for telemedicine to maximize safety during the pandemic.  MyChart video was not obtainable.   Name: Phillip Franklin MRN: [id number]  Date: 04/17/2020  DOB: 03-18-1941  IH:[id number], Jori Moll, MD  Curt Bears, MD   REFERRING PHYSICIAN: Curt Bears, MD  DIAGNOSIS: The primary encounter diagnosis was Metastasis to brain [facility]). A diagnosis of Brain metastases [facility]) was also pertinent to this visit.    ICD-10-CM   1. Metastasis to brain (HCC)  C79.31 LORazepam (ATIVAN) 0.5 MG tablet  2. Brain metastases ([place])  C79.31    HISTORY OF PRESENT ILLNESS::Phillip Franklin is a 79 y.o. male who was recently diagnosed with metastatic lung cancer. He initially presented to his PCP with weight loss and cough.  On 03/24/2020, the patient underwent bronchoscopy and was diagnosed with metastatic non-small cell lung cancer, adenocarcinoma of LUL. Please see my partner Dr. Clabe Seal note from 04/09/2020 for full narrative.  He has started [place] and systemic chemotherapy  MRI of the brain on 03/27/2020 demonstrated five cerebellar metastases, the largest being in the left cerebellum and measuring 8 mm in diameter. No mass effect or hemorrhage. There was also noted to be an incidental meningioma at the right lateral frontal convexity with a diameter of 2 cm and a thickness of 9 mm; no mass-effect upon the brain. There were no metastases identified in either cerebral hemispheres.   [name] not claustrophobic. His wife believe he could not stay still during the 3 Tesla MRI today due to loud noises from the machine.  [name] hard of hearing.  He has been vaccinated from Covid.  His wife has as well.  They are taking precautions.  Pain on a scale of 0-10 is: Patient and wife denies   Recent neurologic symptoms, if any:   Seizures: None  Headaches: None  Nausea: None but per wife  patient is not interested in eating, and has to be encouraged frequently to eat/drink  Dizziness/ataxia: Sometimes unsteady on his feet when walking (per wife)  Difficulty with hand coordination: None  Focal numbness/weakness: Patient is very tired and weak all the time  Visual deficits/changes: None  Confusion/Memory deficits: Wife reports he seems to have trouble remembering short term events (like appointments and daily occurrences)  Ambulatory status? Walker? Wheelchair?: Able to get around the house unassisted, but uses wheelchair for appointments/longer outings  SAFETY ISSUES:  Prior radiation? No  Pacemaker/ICD? No  Possible current pregnancy? N/A  Is the patient on methotrexate? No  Current Complaints / other details:  Wife reports a rash on neck and trunk that they are managing with hydrocortisone cream.     PREVIOUS RADIATION THERAPY: No  PAST MEDICAL HISTORY:  has a past medical history of Depression, GERD (gastroesophageal reflux disease), High cholesterol, Hypertension, and Hypothyroid.    PAST SURGICAL HISTORY: Past Surgical History:  Procedure Laterality Date   BRONCHIAL NEEDLE ASPIRATION BIOPSY  03/24/2020   Procedure: BRONCHIAL NEEDLE ASPIRATION BIOPSIES;  Surgeon: Candee Furbish, MD;  Location: [facility] ENDOSCOPY;  Service: Pulmonary;;   HEMORRHOID SURGERY     VIDEO BRONCHOSCOPY WITH ENDOBRONCHIAL ULTRASOUND N/A 03/24/2020   Procedure: VIDEO BRONCHOSCOPY WITH ENDOBRONCHIAL ULTRASOUND;  Surgeon: Candee Furbish, MD;  [facility] END[facility]COPY;  Service: Pulmonary;  Laterality: N/A;    FAMILY HISTORY: family history includes Heart disease in his father.  SOCIAL HISTORY:  reports that he quit  smoking about 42 years ago. His smoking use included cigarettes and pipe. He has never used smokeless tobacco. He reports that he does not drink alcohol and does not use drugs.  ALLERGIES: Codeine, Hydrocodone, Oxycodone, and Penicillins  MEDICATIONS:  Current Outpatient  Medications  Medication Sig Dispense Refill   acetaminophen (TYLENOL) 325 MG tablet Take 325-650 mg by mouth every 6 (six) hours as needed for moderate pain or headache.     atorvastatin (LIPITOR) 20 MG tablet Take 20 mg by mouth daily.     benzonatate (TESSALON) 200 MG capsule Take 1 capsule (200 mg total) by mouth 4 (four) times daily as needed for cough. 60 capsule 1   Coenzyme Q10 (COQ-10) 100 MG CAPS Take 100 mg by mouth every evening. (Patient not taking: Reported on 04/13/2020)     dronabinol (MARINOL) 2.5 MG capsule Take 1 capsule (2.5 mg total) by mouth 2 (two) times daily before lunch and supper. 60 capsule 0   esomeprazole (NEXIUM) 20 MG capsule Take 20 mg by mouth daily as needed (acid reflux).     fluconazole (DIFLUCAN) 100 MG tablet Take 1 tablet (100 mg total) by mouth daily. 10 tablet 0   folic acid (FOLVITE) 1 MG tablet Take 1 tablet (1 mg total) by mouth daily. 30 tablet 4   levothyroxine (SYNTHROID) 88 MCG tablet Take 88 mcg by mouth daily before breakfast.     LORazepam (ATIVAN) 0.5 MG tablet Take 1-2 tablets by mouth 30 minutes before procedures, PRN anxiety. 8 tablet 0   mirtazapine (REMERON) 30 MG tablet Take 1 tablet (30 mg total) by mouth at bedtime. 30 tablet 2   prochlorperazine (COMPAZINE) 10 MG tablet Take 1 tablet (10 mg total) by mouth every 6 (six) hours as needed for nausea or vomiting. 30 tablet 0   tamsulosin (FLOMAX) 0.4 MG CAPS capsule Take 0.4 mg by mouth every evening.      No current facility-administered medications for this encounter.    REVIEW OF SYSTEMS: As above  Pertinent items are noted in HPI.   PHYSICAL EXAM:  vitals were not taken for this visit.   Physical exam cannot be performed by phone  LABORATORY DATA:  Lab Results  Component Value Date   WBC 2.3 (L) 04/13/2020   HGB 14.0 04/13/2020   HCT 44.2 04/13/2020   MCV 88.2 04/13/2020   PLT 236 04/13/2020   CMP     Component Value Date/Time   NA 138 04/13/2020 1150   K  3.7 04/13/2020 1150   CL 103 04/13/2020 1150   CO2 24 04/13/2020 1150   GLUCOSE 121 (H) 04/13/2020 1150   BUN 17 04/13/2020 1150   CREATININE 1.41 (H) 04/13/2020 1150   CALCIUM 10.6 (H) 04/13/2020 1150   PROT 7.8 04/13/2020 1150   ALBUMIN 2.9 (L) 04/13/2020 1150   AST 36 04/13/2020 1150   ALT 46 (H) 04/13/2020 1150   ALKPHOS 151 (H) 04/13/2020 1150   BILITOT 0.8 04/13/2020 1150   GFRNONAA 47 (L) 04/13/2020 1150   GFRAA 54 (L) 04/13/2020 1150         RADIOGRAPHY: MR BRAIN W WO CONTRAST  Result Date: 03/29/2020 CLINICAL DATA:  Staging non-small cell lung cancer. EXAM: MRI HEAD WITHOUT AND WITH CONTRAST TECHNIQUE: Multiplanar, multiecho pulse sequences of the brain and surrounding structures were obtained without and with intravenous contrast. CONTRAST:  24mL GADAVIST GADOBUTROL 1 MMOL/ML IV SOLN COMPARISON:  Head CT 08/29/2011 FINDINGS: Brain: Diffusion imaging does not show any acute or subacute infarction  or other cause of restricted diffusion. 3 mm metastasis right cerebellum axial image 49. 3.5 mm metastasis left cerebellum axial image 33. 8 mm metastasis left cerebellum axial image 39. Possible punctate metastasis posterior left cerebellum axial image 41. Punctate metastasis left vermis axial image 44. No metastases identified in either cerebral hemisphere. Meningioma at the right frontal lateral convexity with a diameter of 2 cm and a thickness of 9 mm. No mass-effect upon the brain. Minimal small vessel change of the hemispheric white matter. No cortical or large vessel territory infarction. No hydrocephalus or extra-axial collection. Vascular: Major vessels at the base of the brain show flow. Skull and upper cervical spine: Negative Sinuses/Orbits: Clear/normal Other: None IMPRESSION: Five cerebellar metastases identified as described above. The largest is in the left cerebellum measuring 8 mm in diameter. No mass effect or hemorrhage. No metastases identified in either cerebral hemisphere.  Incidental meningioma at the right lateral frontal convexity with a diameter of 2 cm and a thickness of 9 mm. No mass-effect upon the brain. Electronically Signed   By: Nelson Chimes M.D.   On: 03/29/2020 14:46   NM PET Image Initial (PI) Skull Base To Thigh  Result Date: 03/22/2020 CLINICAL DATA:  Initial treatment strategy for non-small cell lung cancer staging. EXAM: NUCLEAR MEDICINE PET SKULL BASE TO THIGH TECHNIQUE: 8.86 mCi F-18 FDG was injected intravenously. Full-ring PET imaging was performed from the skull base to thigh after the radiotracer. CT data was obtained and used for attenuation correction and anatomic localization. Fasting blood glucose: 96 mg/dl COMPARISON:  Chest CT from March 06, 2020 FINDINGS: Mediastinal blood pool activity: SUV max 3.86 Liver activity: SUV max NA NECK: Diffuse increased metabolic activity at the base of tongue is relatively symmetric. Slightly asymmetric towards the LEFT in the submandibular gland some mild fullness but without discrete mass in this location (SUVmax = 11.5 Diffusely hypermetabolic thyroid gland (SUVmax = 12.4) Diffuse hypermetabolism at level 2 bilaterally adjacent to carotid vasculature particularly on the LEFT.) Incidental CT findings: none CHEST: Diffusely abnormal exam with respect to the chest with numerous areas of increased metabolic activity. (Image 52, series 4) 14 mm RIGHT paratracheal lymph node (SUVmax = 14.4) Dominant mass in the LEFT upper lobe measuring approximately 4.6 x 4.6 cm (SUVmax = 25. Mass invades the extrapleural fat in the LEFT upper lobe in shows pleural involvement with circumferential metabolic activity tracking to the LEFT lung apex as exhibited on the recent diagnostic CT. Bi lateral hilar, anterior mediastinal and subcarinal adenopathy along with bilateral pulmonary nodules compatible with metastatic disease. Dominant lesion in the RIGHT chest measures 2.5 x 2.2 cm this shows some central cavitation suggesting some necrosis  and displays less activity with some peripheral activity than other areas within the chest. Diffuse pleural disease and signs of chest wall involvement track through the LEFT chest particularly posteriorly on image 74 of series 4 where there is overt chest wall and rib involvement of the LEFT posterior eighth rib (SUVmax = 16.5) Incidental CT findings: Calcified atheromatous plaque in the thoracic aorta. Three-vessel coronary artery disease. Heart size is normal. No pericardial fluid. ABDOMEN/PELVIS: Heterogeneity of hepatic uptake without discrete, focal hepatic lesion. Some areas on altered window wing suggested in the dome of the liver (image 89 of series 3 (SUVmax = 6.4) Focal antral uptake in the stomach with some uptake also in the mid stomach without PET correlate. Adrenal glands are normal. Perianal uptake, nonspecific. Incidental CT findings: No acute gastrointestinal process marked prostate heterogeneity and  enlargement with variable uptake, nonspecific finding. (SUVmax = 5.3 but with diffusely variable uptake throughout the prostate. Calcified atheromatous plaque of the abdominal aorta extending in the iliac vessels.) SKELETON: Is signs of multifocal bony metastatic disease involving the sacrum, cervical, thoracic and lumbar spine as well as the RIGHT scapula. Multifocal rib lesions also demonstrated in addition to direct involvement from pleural and chest wall disease in the upper LEFT chest. Bilateral pelvic metastatic lesions. These involve iliac pubic bones and sacral ala as discussed. C2 lesion (SUVmax = 7.1) T8 lesion (SUVmax = 7.5) L1 lesion (SUVmax = 11.5) Example of sacral involvement in the RIGHT sacrum (SUVmax = 12.2 (image 155, series 4) many of these lesions without definitive CT correlates. LEFT pubic bone, anteriorly in the inferior pubic ramus (SUVmax = 9.5) Example of rib lesion away from extensive pleural disease in the anterior LEFT chest involving the LEFT anterior second rib (SUVmax =  7.1 (image 63, series 4) multifocal rib lesions are seen bilaterally with similar appearance. Similar appearance of RIGHT scapular metastatic disease. Involvement of the cervical spine may be more extensive than suggested there is some motion which does limit assessment and there is variable uptake in the head in general as compared to the remainder of the exam. ) Incidental CT findings: Uptake along the costochondral margin (image 118, series 4) (SUVmax = 6.6 with potential costochondral fracture in this location) IMPRESSION: 1. Widespread metastatic disease in the chest and 2 multifocal areas within the spine, ribs and pelvis as well as RIGHT scapula. 2. Increased metabolic activity about the base of tongue bilaterally and potentially in cervical lymph nodes though discrete nodes are not well demonstrated. Some of this activity in the neck could be related to acquisition artifact as there is diffuse activity about the face as well which has no CT correlate. Primary neoplasm is also considered in this location. Direct visualization may be warranted. 3. Diffuse thyroid uptake correlate with thyroid function and sonographic assessment. 4. Direct chest wall extension and multifocal rib involvement from direct extension and metastases. 5. Heterogeneous prostate is a nonspecific finding but with increased uptake would consider correlation with PSA for further assessment. 6. Nonspecific perianal uptake and gastric uptake, dedicated imaging of the abdomen and pelvis with diagnostic CT could potentially be of benefit to assess for any focal lesion, none seen on current PET though there is mild antral thickening. 7. Potential nondisplaced fracture of the costochondral junction inferiorly along the costal margin as described. These results will be called to the ordering clinician or representative by the Radiologist Assistant, and communication documented in the PACS or [employer]. Electronically Signed   By: Zetta Bills M.D.   On: 03/22/2020 16:41      IMPRESSION/PLAN: This is a very pleasant 79 y.o. gentleman with metastatic disease to the brain.  I had a lengthy discussion with the patient after reviewing their MRI results with them.  I personally reviewed his images.  We spoke about whole brain radiotherapy versus stereotactic radiosurgery to the brain.  I recommend considering whole brain radiotherapy only if the 3 Tesla MRI ultimately shows a sharp increase in the number of brain metastases.  Most likely, the best option for his treatment will be targeted stereotactic radiosurgery to the specific lesions that we can see.  We spoke about acute effects from radiosurgery to the brain including low-grade headache and fatigue and we also spoke about uncommon radionecrosis that can result from stereotactic radiosurgery.    After lengthy discussion,  the patient would like to proceed with stereotactic brain radiosurgery to their metastatic disease. They will meet with neurosurgery in the near future to discuss this further; a neurosurgeon will participate in their case.  I provided a prescription for lorazepam to take before his MRI in hopes that this will make it easier for him to stay still.  MRI has been rescheduled for next week and treatment planning will take place soon thereafter.  I plan to deliver 20 Gy in 1 fraction to each brain lesion.  We discussed measures to reduce the risk of infection during the COVID-19 pandemic.  He has been vaccinated.  I explained the importance of continuing to wear masks and take contact precautions.  They understand the importance of these measures.  This encounter was provided by telemedicine platform [employer].  The patient has given verbal consent for this type of encounter and has been advised to only accept a meeting of this type in a secure network environment. On date of service, in total, the time spent during this encounter was 50 minutes. The attendants for this  meeting include Eppie Gibson  and Sid Falcon.  During the encounter, Eppie Gibson was located at [facility] Radiation Oncology Department.  KALVYN DESA was located at home.   __________________________________________   Eppie Gibson, MD  This document serves as a record of services personally performed by Eppie Gibson, MD. It was created on her behalf by Wilburn Mylar, a trained medical scribe. The creation of this record is based on the scribe's personal observations and the provider's statements to them. This document has been checked and approved by the attending provider.

## 2020-04-16 NOTE — Telephone Encounter (Signed)
Pt wife stating pt has small rash with small bumps around the neck. Requesting to use hydrocortisone 1%. Per Dr.[name], OK to use. Called pt wife with the OK to use. Pt wife also stated, pt is refusing to take px Marinol and once he makes up his mind about something, she won't force it. Made Dr.[name] aware.

## 2020-04-16 NOTE — Progress Notes (Signed)
Patient's spouse called to inquire about financial assistance.  Discussed qualifications for the [employer]. She states they exceed the income guidelines. There is currently no assistance available for his diagnosis/treatment.  Advised they may apply for Medicaid as secondary and provided contact name and number. She verbalized understanding and has my card for any additional financial questions or concerns.

## 2020-04-17 ENCOUNTER — Ambulatory Visit
Admission: RE | Admit: 2020-04-17 | Discharge: 2020-04-17 | Disposition: A | Discharge status: Home / Self Care | Payer: Medicare PPO | Source: Ambulatory Visit | Attending: Radiation Oncology | Admitting: Radiation Oncology

## 2020-04-17 ENCOUNTER — Encounter: Payer: Self-pay | Admitting: Radiation Oncology

## 2020-04-17 ENCOUNTER — Ambulatory Visit: Payer: Medicare PPO | Admitting: Radiation Oncology

## 2020-04-17 ENCOUNTER — Other Ambulatory Visit: Payer: Self-pay

## 2020-04-17 ENCOUNTER — Ambulatory Visit: Admission: RE | Admit: 2020-04-17 | Payer: Medicare PPO | Source: Ambulatory Visit

## 2020-04-17 DIAGNOSIS — C7931 Secondary malignant neoplasm of brain: Secondary | ICD-10-CM | POA: Diagnosis not present

## 2020-04-17 IMAGING — MR MR HEAD WO/W CM
11 series · 48 of 48 positions shown · IV contrast (MULTIHANCE)
Comparison: MRI head [DATE]
COMPARISON: MRI head [DATE]

Addendum:
CLINICAL DATA: Lung cancer metastatic to brain. SRS treatment
planning

EXAM:
MRI HEAD WITHOUT AND WITH CONTRAST
TECHNIQUE: Multiplanar, multiecho pulse sequences of the brain and surrounding
structures were obtained without and with intravenous contrast.
CONTRAST:  15mL MULTIHANCE GADOBENATE DIMEGLUMINE 529 MG/ML IV SOLN

[Series 9: FLAIR · sagittal · 3.0mm · 0.75mm/px · 2 of 39 slices shown (1 of 2)]
[im 1/39]
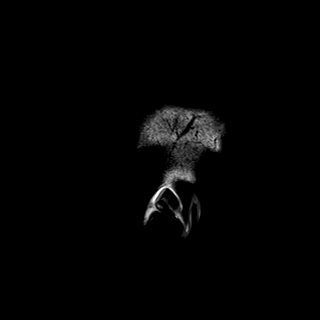
[im 39/39]
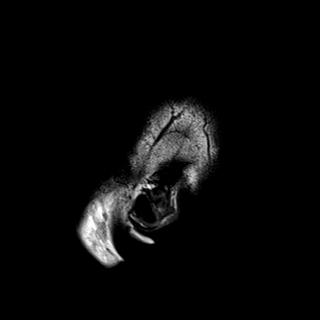

[Series 10: DWI · axial · 3.0mm · 1.50mm/px · z∈[-86,+63]mm · 5 of 78 slices shown (1 of 2)]
[im 1/78]
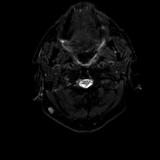
[im 20/78]
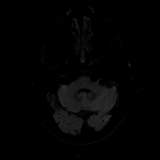
[im 39/78]
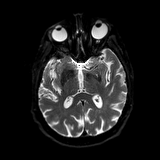
[im 58/78]
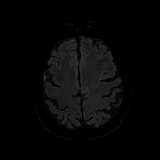
[im 78/78]
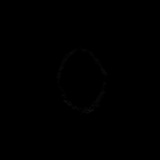

[Series 11: DWI · axial · 3.0mm · 1.50mm/px · z∈[-86,+63]mm · 2 of 38 slices shown (2 of 2)]
[im 1/38]
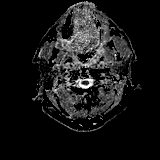
[im 38/38]
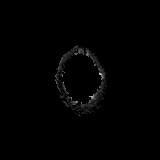

[Series 12: T2 · axial · 5.0mm · 0.57mm/px · z∈[-93,+63]mm · 2 of 27 slices shown]
[im 1/27]
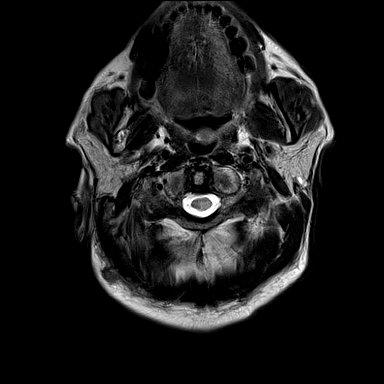
[im 27/27]
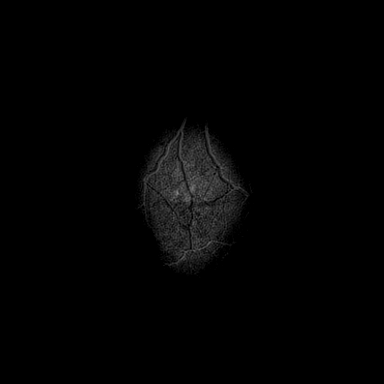

[Series 14: swi_images · axial · 1.5mm · 0.90mm/px · z∈[-89,+66]mm · 6 of 104 slices shown]
[im 1/104]
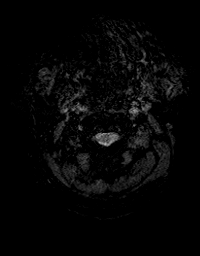
[im 21/104]
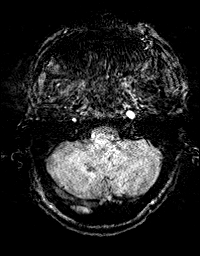
[im 42/104]
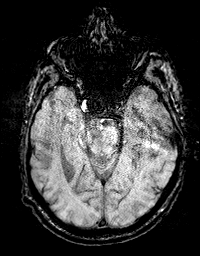
[im 62/104]
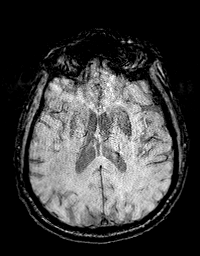
[im 83/104]
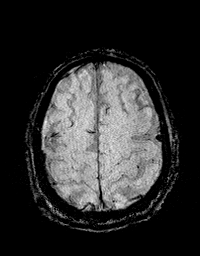
[im 104/104]
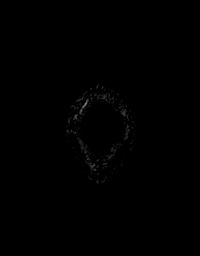

[Series 15: FLAIR · axial · 3.0mm · 0.86mm/px · z∈[-90,+76]mm · 3 of 56 slices shown (2 of 2)]
[im 1/56]
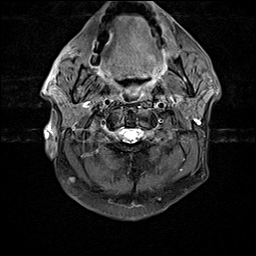
[im 28/56]
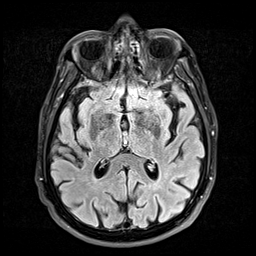
[im 56/56]
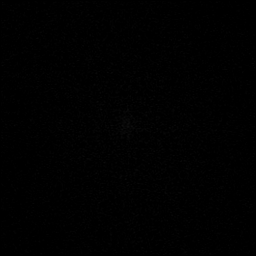

[Series 16: T1 · axial · 1.0mm · 0.75mm/px · z∈[-95,+64]mm · 10 of 160 slices shown]
[im 1/160]
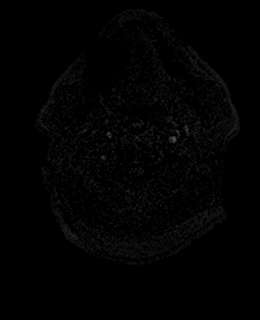
[im 18/160]
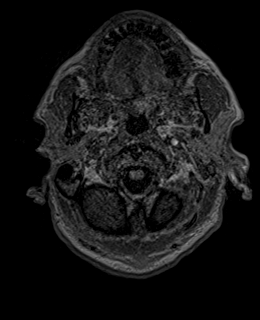
[im 36/160]
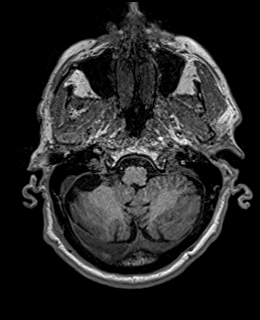
[im 54/160]
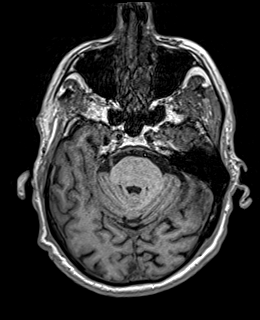
[im 71/160]
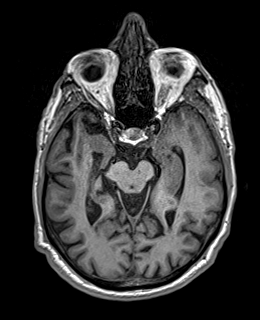
[im 89/160]
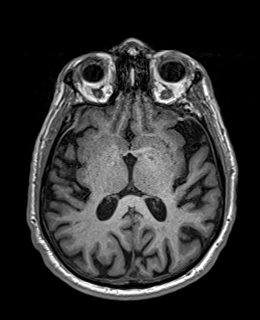
[im 107/160]
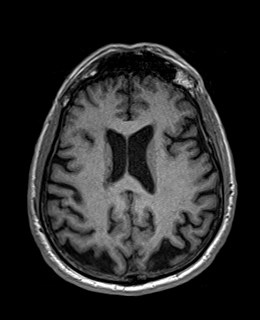
[im 124/160]
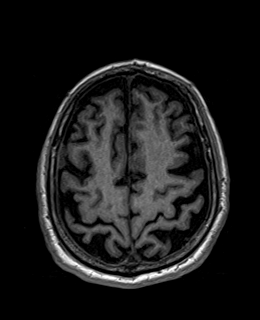
[im 142/160]
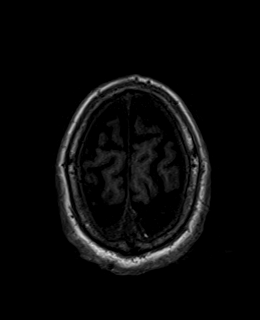
[im 160/160]
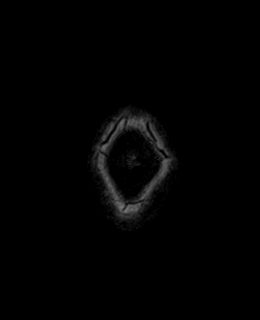

[Series 17: T2 post-contrast · coronal · 3.0mm · 0.57mm/px · 3 of 47 slices shown]
[im 1/47]
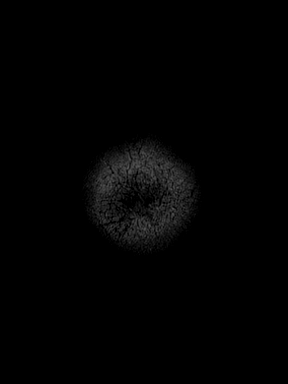
[im 24/47]
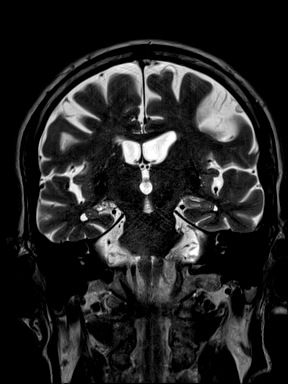
[im 47/47]
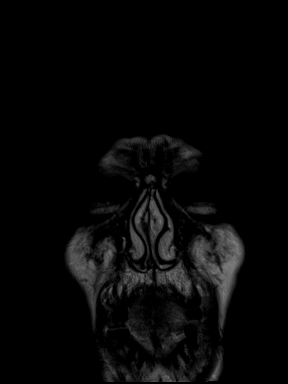

[Series 18: T1 post-contrast · axial · 1.0mm · 0.75mm/px · z∈[-95,+64]mm · 10 of 160 slices shown (1 of 2)]
[im 1/160]
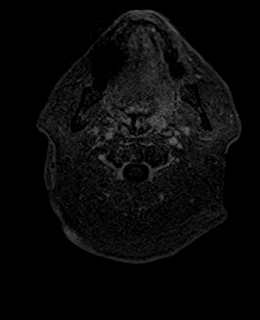
[im 18/160]
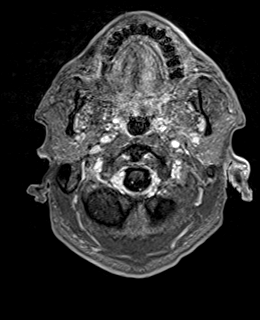
[im 36/160]
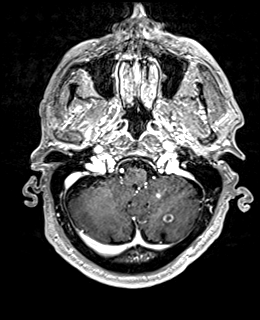
[im 54/160]
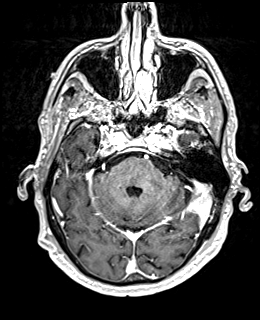
[im 71/160]
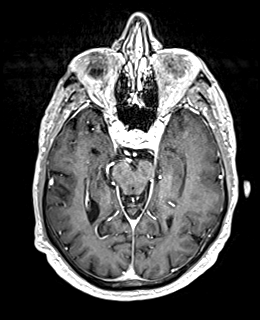
[im 89/160]
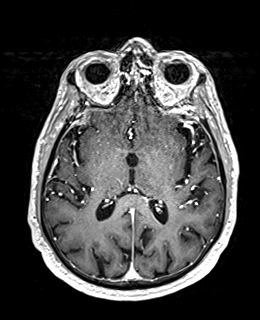
[im 107/160]
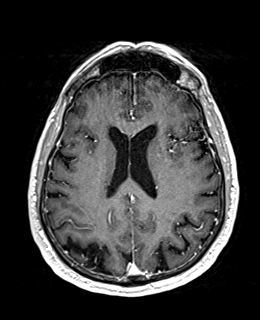
[im 124/160]
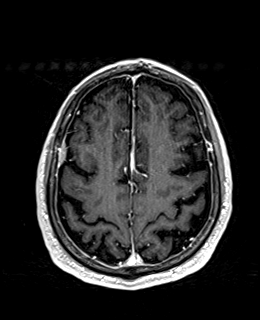
[im 142/160]
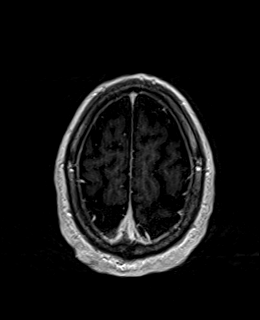
[im 160/160]
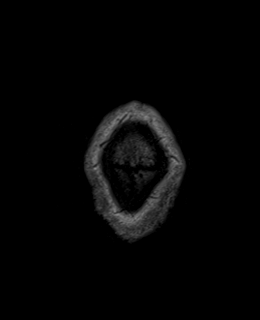

[Series 19: T1 post-contrast · coronal · 3.0mm · 0.57mm/px · 3 of 47 slices shown (2 of 2)]
[im 1/47]
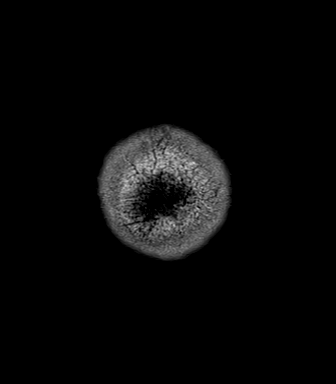
[im 24/47]
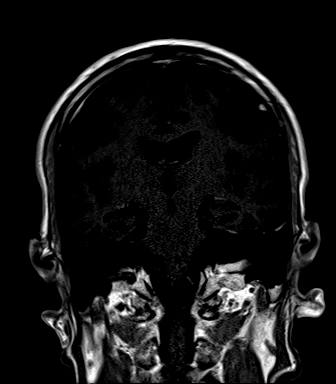
[im 47/47]
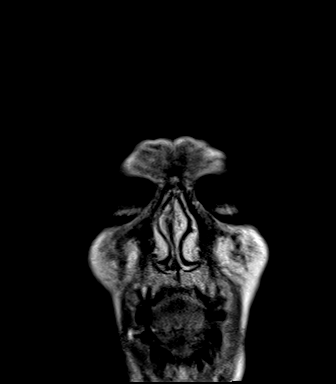

[Series 20: FLAIR post-contrast · sagittal · 3.0mm · 0.75mm/px · 2 of 39 slices shown]
[im 1/39]
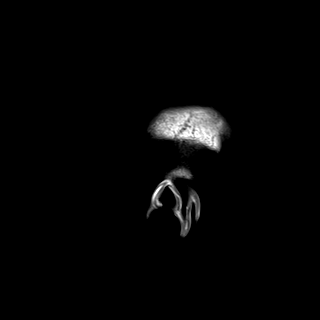
[im 39/39]
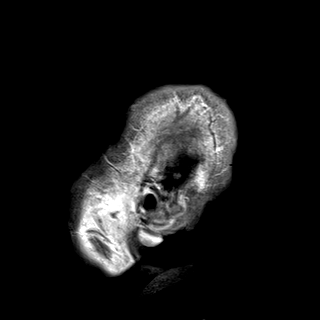

[48 of 48 positions shown; findings below may reference images not displayed]

FINDINGS: Brain: Multiple enhancing lesions in the cerebellum bilaterally
compatible with metastatic disease.

3 mm lesion right superior cerebellum axial image 45

2.5 mm enhancing lesion left inferolateral cerebellum axial image
23. This lesion is not seen on the prior study.

7 mm rim enhancing necrotic lesion left cerebellum axial image 36 is
unchanged.

3 mm enhancing lesion left anterior cerebellum axial image 36 is
unchanged

1.5 mm enhancement left lateral cerebellum axial image 39 possible
metastatic disease versus vessel. This is not seen on the prior
study.

Prior study indicated a small area of enhancement in the left
posterior cerebellum. On today's study this has the appearance of a
small vessel

No supratentorial metastatic deposits.

6 x 19 mm meningioma right frontal convexity is unchanged.

4 mm dural base calcification left frontal convexity is unchanged.
This shows increased signal on T1 and does not enhance. Possible
meningioma or benign dural calcification.

Mild atrophy without hydrocephalus or midline shift. Negative for
acute infarct. No significant cerebellar edema. Negative for
intracranial hemorrhage.

Vascular: Normal arterial flow voids.

Skull and upper cervical spine: No worrisome skeletal lesions.

Sinuses/Orbits: Mild mucosal edema paranasal sinuses. Negative orbit

Other: None
IMPRESSION: Enhancing lesions and cerebellum compatible with metastatic disease.
Largest lesion left cerebellum 7 mm unchanged. No significant edema.
No supratentorial metastatic disease identified

6 x 19 mm meningioma right frontal convexity unchanged. 4 mm left
frontal convexity dural calcification unchanged.

ADDENDUM:
The case was reviewed with Dr. CHUNGKIN.

There is an additional enhancing lesion in the left cerebellum which
was not dictated in the original report. 3 mm lesion in the left
medial cerebellum axial image 40 also is consistent with metastatic
disease. This is marked with an arrow.

In summary, there is 1 metastatic deposit in the right cerebellum.
There are 4 definite lesions in the left cerebellum with a probable
additional lesion in the left lateral cerebellum on image 39, for
total of 6 metastatic deposits in the posterior fossa.

*** End of Addendum ***
FINDINGS: Brain: Multiple enhancing lesions in the cerebellum bilaterally
compatible with metastatic disease.

3 mm lesion right superior cerebellum axial image 45

2.5 mm enhancing lesion left inferolateral cerebellum axial image
23. This lesion is not seen on the prior study.

7 mm rim enhancing necrotic lesion left cerebellum axial image 36 is
unchanged.

3 mm enhancing lesion left anterior cerebellum axial image 36 is
unchanged

1.5 mm enhancement left lateral cerebellum axial image 39 possible
metastatic disease versus vessel. This is not seen on the prior
study.

Prior study indicated a small area of enhancement in the left
posterior cerebellum. On today's study this has the appearance of a
small vessel

No supratentorial metastatic deposits.

6 x 19 mm meningioma right frontal convexity is unchanged.

4 mm dural base calcification left frontal convexity is unchanged.
This shows increased signal on T1 and does not enhance. Possible
meningioma or benign dural calcification.

Mild atrophy without hydrocephalus or midline shift. Negative for
acute infarct. No significant cerebellar edema. Negative for
intracranial hemorrhage.

Vascular: Normal arterial flow voids.

Skull and upper cervical spine: No worrisome skeletal lesions.

Sinuses/Orbits: Mild mucosal edema paranasal sinuses. Negative orbit

Other: None
IMPRESSION: Enhancing lesions and cerebellum compatible with metastatic disease.
Largest lesion left cerebellum 7 mm unchanged. No significant edema.
No supratentorial metastatic disease identified

6 x 19 mm meningioma right frontal convexity unchanged. 4 mm left
frontal convexity dural calcification unchanged.

## 2020-04-17 MED ORDER — LORAZEPAM 0.5 MG PO TABS: ORAL_TABLET | ORAL | 0 refills | Status: AC

## 2020-04-18 ENCOUNTER — Other Ambulatory Visit (HOSPITAL_COMMUNITY)
Admission: RE | Admit: 2020-04-18 | Discharge: 2020-04-18 | Disposition: A | Discharge status: Home / Self Care | Payer: Medicare PPO | Source: Ambulatory Visit | Attending: Internal Medicine | Admitting: Internal Medicine

## 2020-04-18 DIAGNOSIS — Z20822 Contact with and (suspected) exposure to covid-19: Secondary | ICD-10-CM | POA: Insufficient documentation

## 2020-04-18 DIAGNOSIS — Z01812 Encounter for preprocedural laboratory examination: Secondary | ICD-10-CM | POA: Diagnosis not present

## 2020-04-18 LAB — SARS CORONAVIRUS 2 (TAT 6-24 HRS): SARS Coronavirus 2: NEGATIVE

## 2020-04-20 ENCOUNTER — Other Ambulatory Visit: Payer: Self-pay

## 2020-04-20 ENCOUNTER — Inpatient Hospital Stay: Payer: Medicare PPO | Attending: Internal Medicine

## 2020-04-20 ENCOUNTER — Other Ambulatory Visit: Payer: Self-pay | Admitting: Physician Assistant

## 2020-04-20 DIAGNOSIS — Z5112 Encounter for antineoplastic immunotherapy: Secondary | ICD-10-CM | POA: Insufficient documentation

## 2020-04-20 DIAGNOSIS — C3490 Malignant neoplasm of unspecified part of unspecified bronchus or lung: Secondary | ICD-10-CM | POA: Diagnosis not present

## 2020-04-20 DIAGNOSIS — C7951 Secondary malignant neoplasm of bone: Secondary | ICD-10-CM | POA: Diagnosis not present

## 2020-04-20 DIAGNOSIS — R05 Cough: Secondary | ICD-10-CM | POA: Insufficient documentation

## 2020-04-20 DIAGNOSIS — R630 Anorexia: Secondary | ICD-10-CM | POA: Insufficient documentation

## 2020-04-20 DIAGNOSIS — R634 Abnormal weight loss: Secondary | ICD-10-CM | POA: Diagnosis not present

## 2020-04-20 DIAGNOSIS — C7931 Secondary malignant neoplasm of brain: Secondary | ICD-10-CM | POA: Diagnosis not present

## 2020-04-20 DIAGNOSIS — C3492 Malignant neoplasm of unspecified part of left bronchus or lung: Secondary | ICD-10-CM

## 2020-04-20 LAB — CMP (CANCER CENTER ONLY)
ALT: 33 U/L (ref 0–44)
AST: 27 U/L (ref 15–41)
Albumin: 2.9 g/dL — ABNORMAL LOW (ref 3.5–5.0)
Alkaline Phosphatase: 170 U/L — ABNORMAL HIGH (ref 38–126)
Anion gap: 8 (ref 5–15)
BUN: 5 mg/dL — ABNORMAL LOW (ref 8–23)
CO2: 24 mmol/L (ref 22–32)
Calcium: 10.6 mg/dL — ABNORMAL HIGH (ref 8.9–10.3)
Chloride: 108 mmol/L (ref 98–111)
Creatinine: 1.45 mg/dL — ABNORMAL HIGH (ref 0.61–1.24)
GFR, Est AFR Am: 52 mL/min — ABNORMAL LOW (ref >60)
GFR, Estimated: 45 mL/min — ABNORMAL LOW (ref >60)
Glucose, Bld: 130 mg/dL — ABNORMAL HIGH (ref 70–99)
Potassium: 3.3 mmol/L — ABNORMAL LOW (ref 3.5–5.1)
Sodium: 140 mmol/L (ref 135–145)
Total Bilirubin: 0.5 mg/dL (ref 0.3–1.2)
Total Protein: 7.3 g/dL (ref 6.5–8.1)

## 2020-04-20 LAB — CBC WITH DIFFERENTIAL (CANCER CENTER ONLY)
Abs Immature Granulocytes: 0.03 10*3/uL (ref 0.00–0.07)
Basophils Absolute: 0 10*3/uL (ref 0.0–0.1)
Basophils Relative: 0 %
Eosinophils Absolute: 0.5 10*3/uL (ref 0.0–0.5)
Eosinophils Relative: 10 %
HCT: 40.1 % (ref 39.0–52.0)
Hemoglobin: 12.9 g/dL — ABNORMAL LOW (ref 13.0–17.0)
Immature Granulocytes: 1 %
Lymphocytes Relative: 35 %
Lymphs Abs: 1.8 10*3/uL (ref 0.7–4.0)
MCH: 28.4 pg (ref 26.0–34.0)
MCHC: 32.2 g/dL (ref 30.0–36.0)
MCV: 88.1 fL (ref 80.0–100.0)
Monocytes Absolute: 0.6 10*3/uL (ref 0.1–1.0)
Monocytes Relative: 11 %
Neutro Abs: 2.1 10*3/uL (ref 1.7–7.7)
Neutrophils Relative %: 43 %
Platelet Count: 168 10*3/uL (ref 150–400)
RBC: 4.55 MIL/uL (ref 4.22–5.81)
RDW: 14.9 % (ref 11.5–15.5)
WBC Count: 5 10*3/uL (ref 4.0–10.5)
nRBC: 0 % (ref 0.0–0.2)

## 2020-04-21 ENCOUNTER — Other Ambulatory Visit: Payer: Self-pay

## 2020-04-21 ENCOUNTER — Ambulatory Visit (HOSPITAL_COMMUNITY)
Admission: RE | Admit: 2020-04-21 | Discharge: 2020-04-21 | Disposition: A | Discharge status: Home / Self Care | Payer: Medicare PPO | Source: Ambulatory Visit | Attending: Interventional Radiology | Admitting: Interventional Radiology

## 2020-04-21 ENCOUNTER — Encounter: Payer: Self-pay | Admitting: *Deleted

## 2020-04-21 ENCOUNTER — Ambulatory Visit (HOSPITAL_COMMUNITY)
Admission: RE | Admit: 2020-04-21 | Discharge: 2020-04-21 | Disposition: A | Discharge status: Home / Self Care | Payer: Medicare PPO | Source: Ambulatory Visit | Attending: Internal Medicine | Admitting: Internal Medicine

## 2020-04-21 DIAGNOSIS — C3490 Malignant neoplasm of unspecified part of unspecified bronchus or lung: Secondary | ICD-10-CM | POA: Diagnosis not present

## 2020-04-21 DIAGNOSIS — C3492 Malignant neoplasm of unspecified part of left bronchus or lung: Secondary | ICD-10-CM | POA: Diagnosis not present

## 2020-04-21 DIAGNOSIS — I10 Essential (primary) hypertension: Secondary | ICD-10-CM | POA: Insufficient documentation

## 2020-04-21 DIAGNOSIS — C3412 Malignant neoplasm of upper lobe, left bronchus or lung: Secondary | ICD-10-CM | POA: Diagnosis not present

## 2020-04-21 DIAGNOSIS — K219 Gastro-esophageal reflux disease without esophagitis: Secondary | ICD-10-CM | POA: Diagnosis not present

## 2020-04-21 DIAGNOSIS — C7931 Secondary malignant neoplasm of brain: Secondary | ICD-10-CM | POA: Diagnosis not present

## 2020-04-21 DIAGNOSIS — J95811 Postprocedural pneumothorax: Secondary | ICD-10-CM | POA: Diagnosis not present

## 2020-04-21 DIAGNOSIS — F329 Major depressive disorder, single episode, unspecified: Secondary | ICD-10-CM | POA: Insufficient documentation

## 2020-04-21 DIAGNOSIS — E039 Hypothyroidism, unspecified: Secondary | ICD-10-CM | POA: Insufficient documentation

## 2020-04-21 DIAGNOSIS — R05 Cough: Secondary | ICD-10-CM | POA: Insufficient documentation

## 2020-04-21 DIAGNOSIS — Z87891 Personal history of nicotine dependence: Secondary | ICD-10-CM | POA: Insufficient documentation

## 2020-04-21 DIAGNOSIS — E7800 Pure hypercholesterolemia, unspecified: Secondary | ICD-10-CM | POA: Insufficient documentation

## 2020-04-21 DIAGNOSIS — Z79899 Other long term (current) drug therapy: Secondary | ICD-10-CM | POA: Diagnosis not present

## 2020-04-21 DIAGNOSIS — E785 Hyperlipidemia, unspecified: Secondary | ICD-10-CM | POA: Insufficient documentation

## 2020-04-21 DIAGNOSIS — R918 Other nonspecific abnormal finding of lung field: Secondary | ICD-10-CM | POA: Diagnosis not present

## 2020-04-21 DIAGNOSIS — C3491 Malignant neoplasm of unspecified part of right bronchus or lung: Secondary | ICD-10-CM | POA: Diagnosis not present

## 2020-04-21 DIAGNOSIS — C7951 Secondary malignant neoplasm of bone: Secondary | ICD-10-CM | POA: Diagnosis not present

## 2020-04-21 LAB — CBC
HCT: 41.8 % (ref 39.0–52.0)
Hemoglobin: 13.2 g/dL (ref 13.0–17.0)
MCH: 28.1 pg (ref 26.0–34.0)
MCHC: 31.6 g/dL (ref 30.0–36.0)
MCV: 89.1 fL (ref 80.0–100.0)
Platelets: 220 10*3/uL (ref 150–400)
RBC: 4.69 MIL/uL (ref 4.22–5.81)
RDW: 15.3 % (ref 11.5–15.5)
WBC: 5.5 10*3/uL (ref 4.0–10.5)
nRBC: 0 % (ref 0.0–0.2)

## 2020-04-21 LAB — PROTIME-INR
INR: 1.2 (ref 0.8–1.2)
Prothrombin Time: 14.7 seconds (ref 11.4–15.2)

## 2020-04-21 IMAGING — CT CT BIOPSY
1 of 3 series · 12 of 32 positions shown, 18 images · non-contrast
Comparison: none

INDICATION: 79-year-old male with a history of lung carcinoma, referred for
additional tissue sample for molecular studies

[Series 2: i-spiral 5.0 b40f · axial · 0.85mm/px · z∈[+1446,+1568]mm · 12 of 41 slices shown, 18 images]
[im 3/41  soft-tissue]
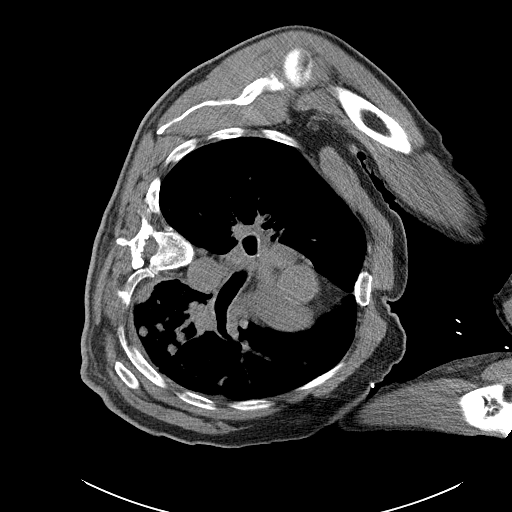
[im 3/41  bone]
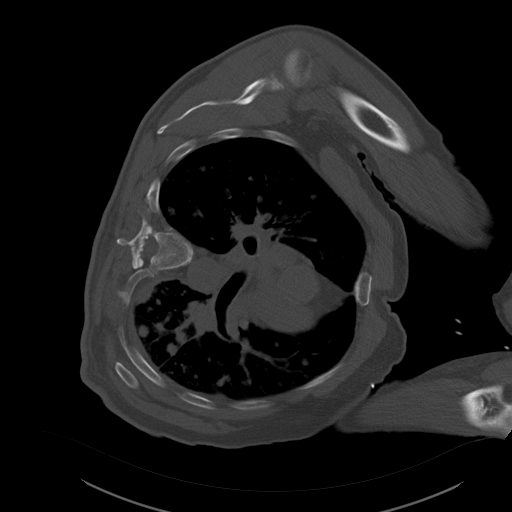
[im 6/41  soft-tissue]
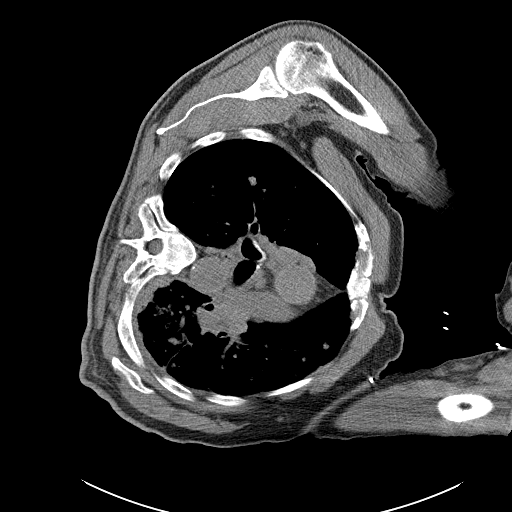
[im 9/41  soft-tissue]
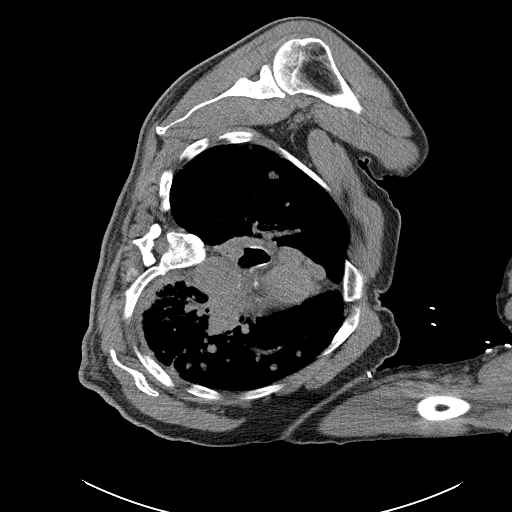
[im 12/41  soft-tissue]
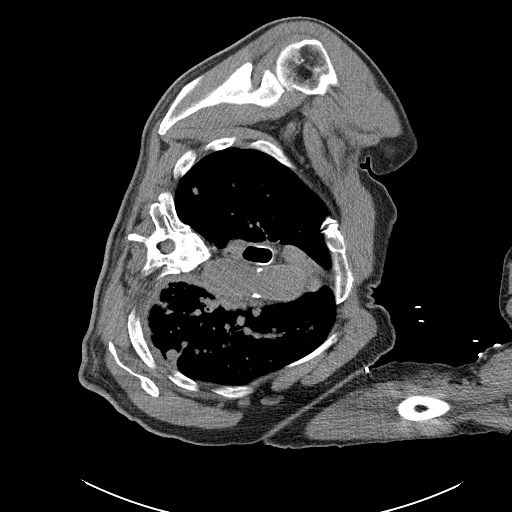
[im 15/41  soft-tissue]
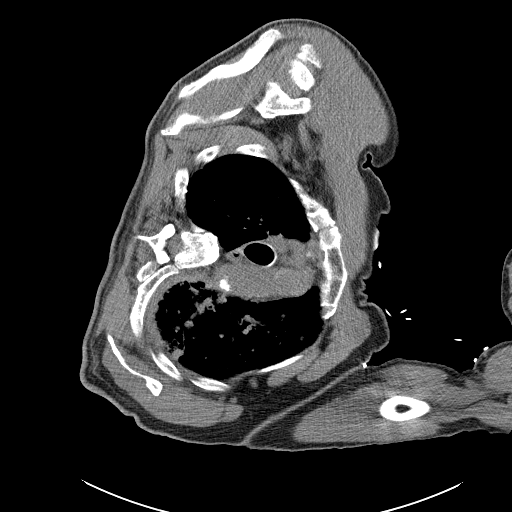
[im 18/41  soft-tissue]
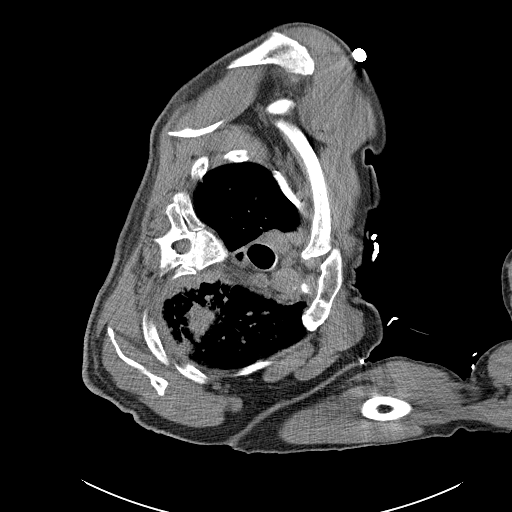
[im 23/41  soft-tissue]
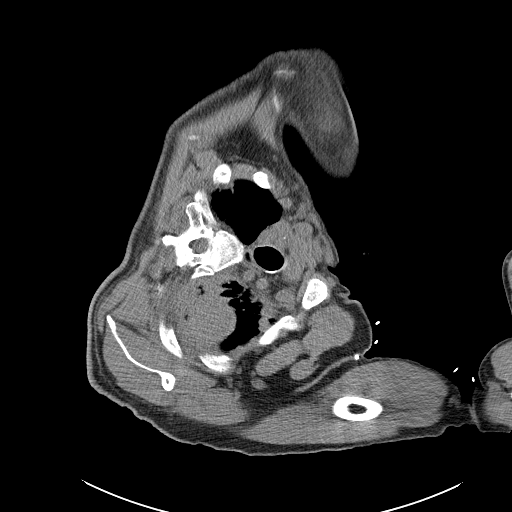
[im 26/41  soft-tissue]
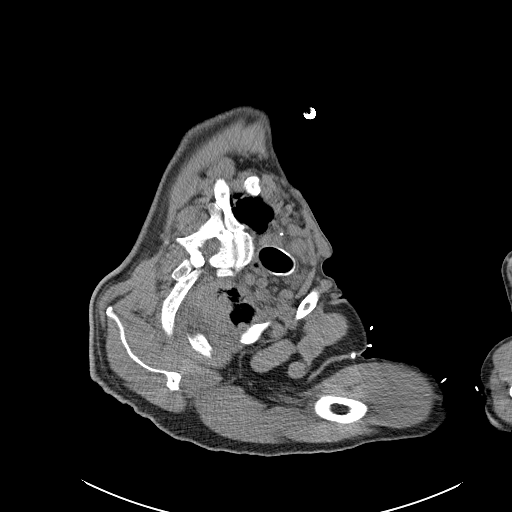
[im 29/41  soft-tissue]
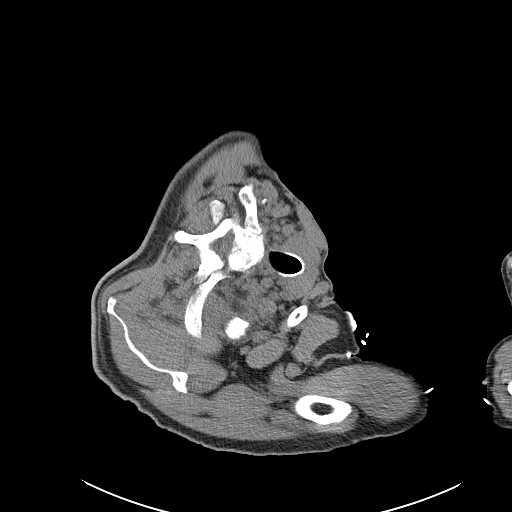
[im 29/41  lung]
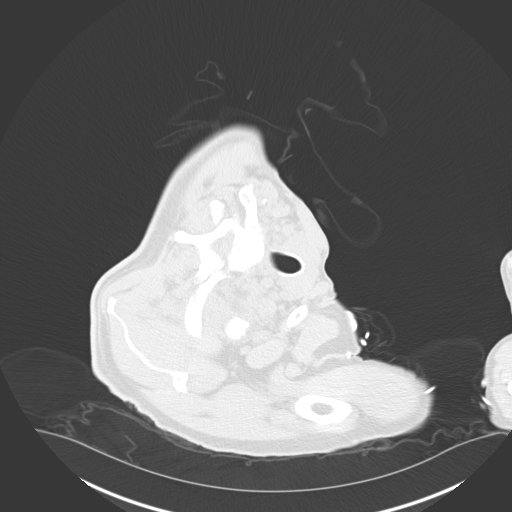
[im 29/41  bone]
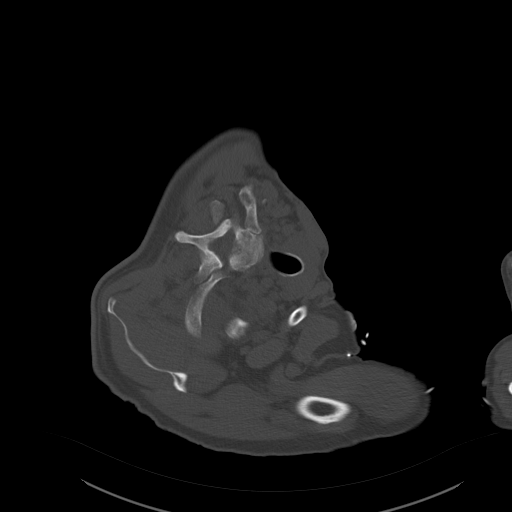
[im 32/41  soft-tissue]
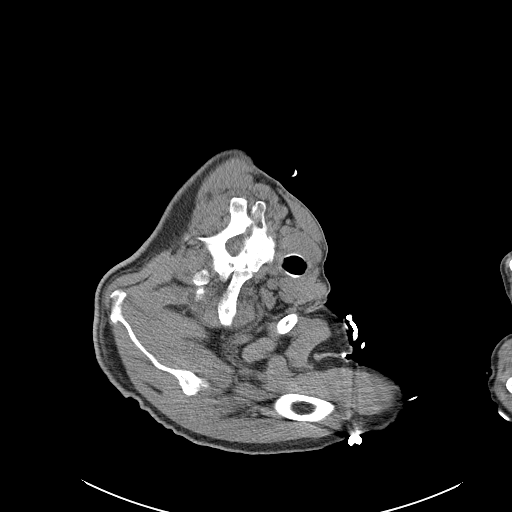
[im 32/41  lung]
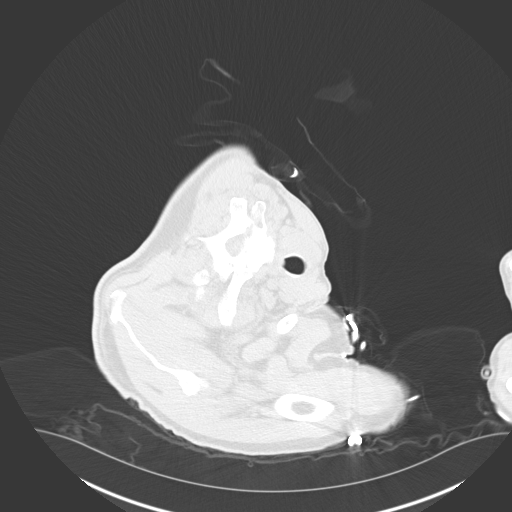
[im 35/41  soft-tissue]
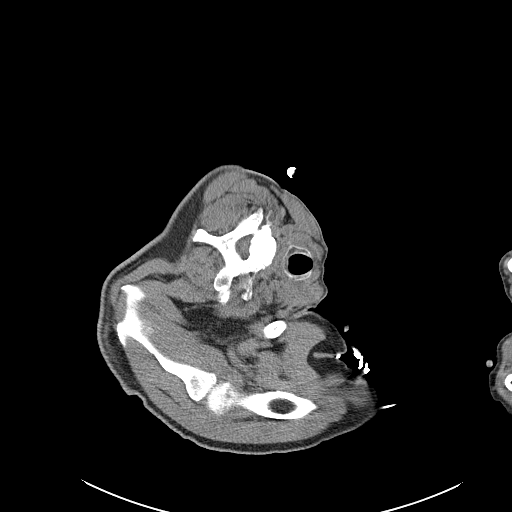
[im 35/41  lung]
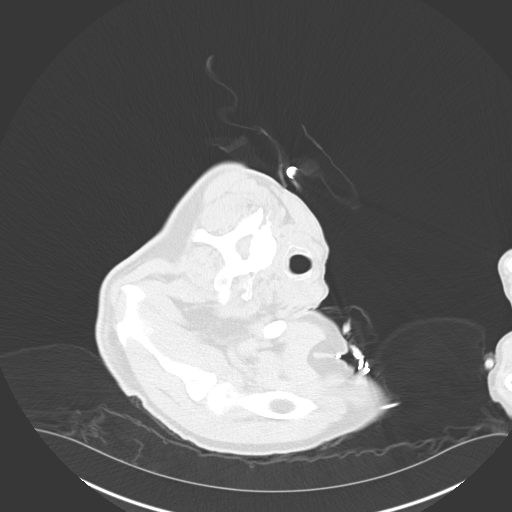
[im 38/41  soft-tissue]
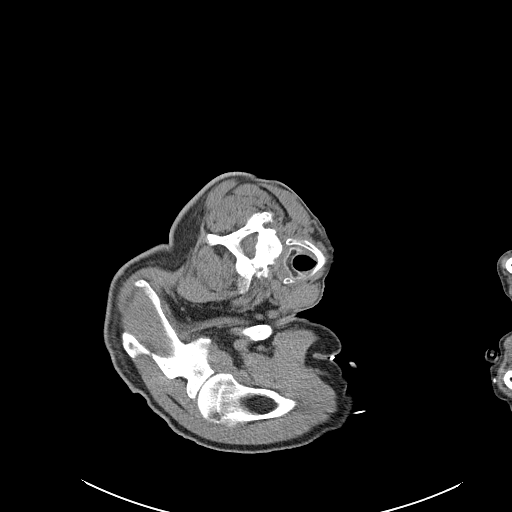
[im 38/41  lung]
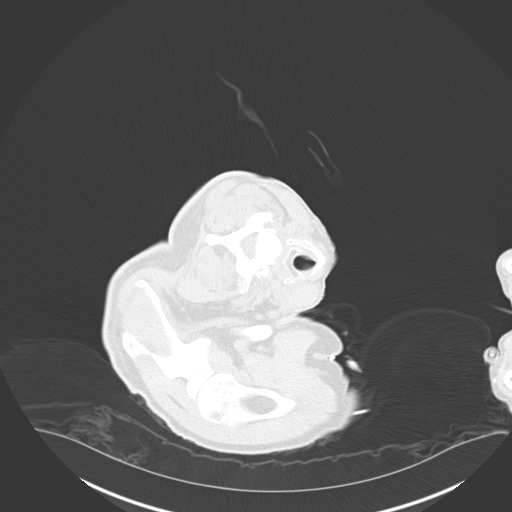

[12 of 32 positions shown; findings below may reference images not displayed]

EXAM:
CT BIOPSY

MEDICATIONS:
None.

ANESTHESIA/SEDATION:
Moderate (conscious) sedation was employed during this procedure. A
total of Versed 1.0 mg and Fentanyl 25 mcg was administered
intravenously.

Moderate Sedation Time: 11 minutes. The patient's level of
consciousness and vital signs were monitored continuously by
radiology nursing throughout the procedure under my direct
supervision.

FLUOROSCOPY TIME:  CT

COMPLICATIONS:
None

PROCEDURE:
The procedure, risks, benefits, and alternatives were explained to
the patient and the patient's family. Specific risks that were
addressed included bleeding, infection, pneumothorax, need for
further procedure including chest tube placement, chance of delayed
pneumothorax or hemorrhage, hemoptysis, nondiagnostic sample,
cardiopulmonary collapse, death. Questions regarding the procedure
were encouraged and answered. The patient understands and consents
to the procedure.

Patient was positioned in the left decubitus position on the CT
gantry table and a scout CT of the chest was performed for planning
purposes.

Once angle of approach was determined, the skin and subcutaneous
tissues this scan was prepped and draped in the usual sterile
fashion, and a sterile drape was applied covering the operative
field. A sterile gown and sterile gloves were used for the
procedure. Local anesthesia was provided with 1% Lidocaine.

The skin and subcutaneous tissues were infiltrated 1% lidocaine for
local anesthesia, and a small stab incision was made with an 11
blade scalpel.

Using CT guidance, a 17 gauge trocar needle was advanced into the
left upper lobetarget. After confirmation of the tip, separate 18
gauge core biopsies were performed. These were placed into solution
for transportation to the lab.

Biosentry Device was deployed. A final CT image was performed.

Patient tolerated the procedure well and remained hemodynamically
stable throughout.

No complications were encountered and no significant blood loss was
encounter
IMPRESSION: Status post CT-guided biopsy of left upper lobe FDG avid lesion.
Tissue specimen sent to pathology for complete histopathologic
analysis.

## 2020-04-21 IMAGING — DX DG CHEST 1V PORT
1 series · 1 of 1 positions shown · non-contrast
Comparison: [DATE]

CLINICAL DATA: 79-year-old male status post left apical lung mass
biopsy

EXAM:
PORTABLE CHEST 1 VIEW

[chest]
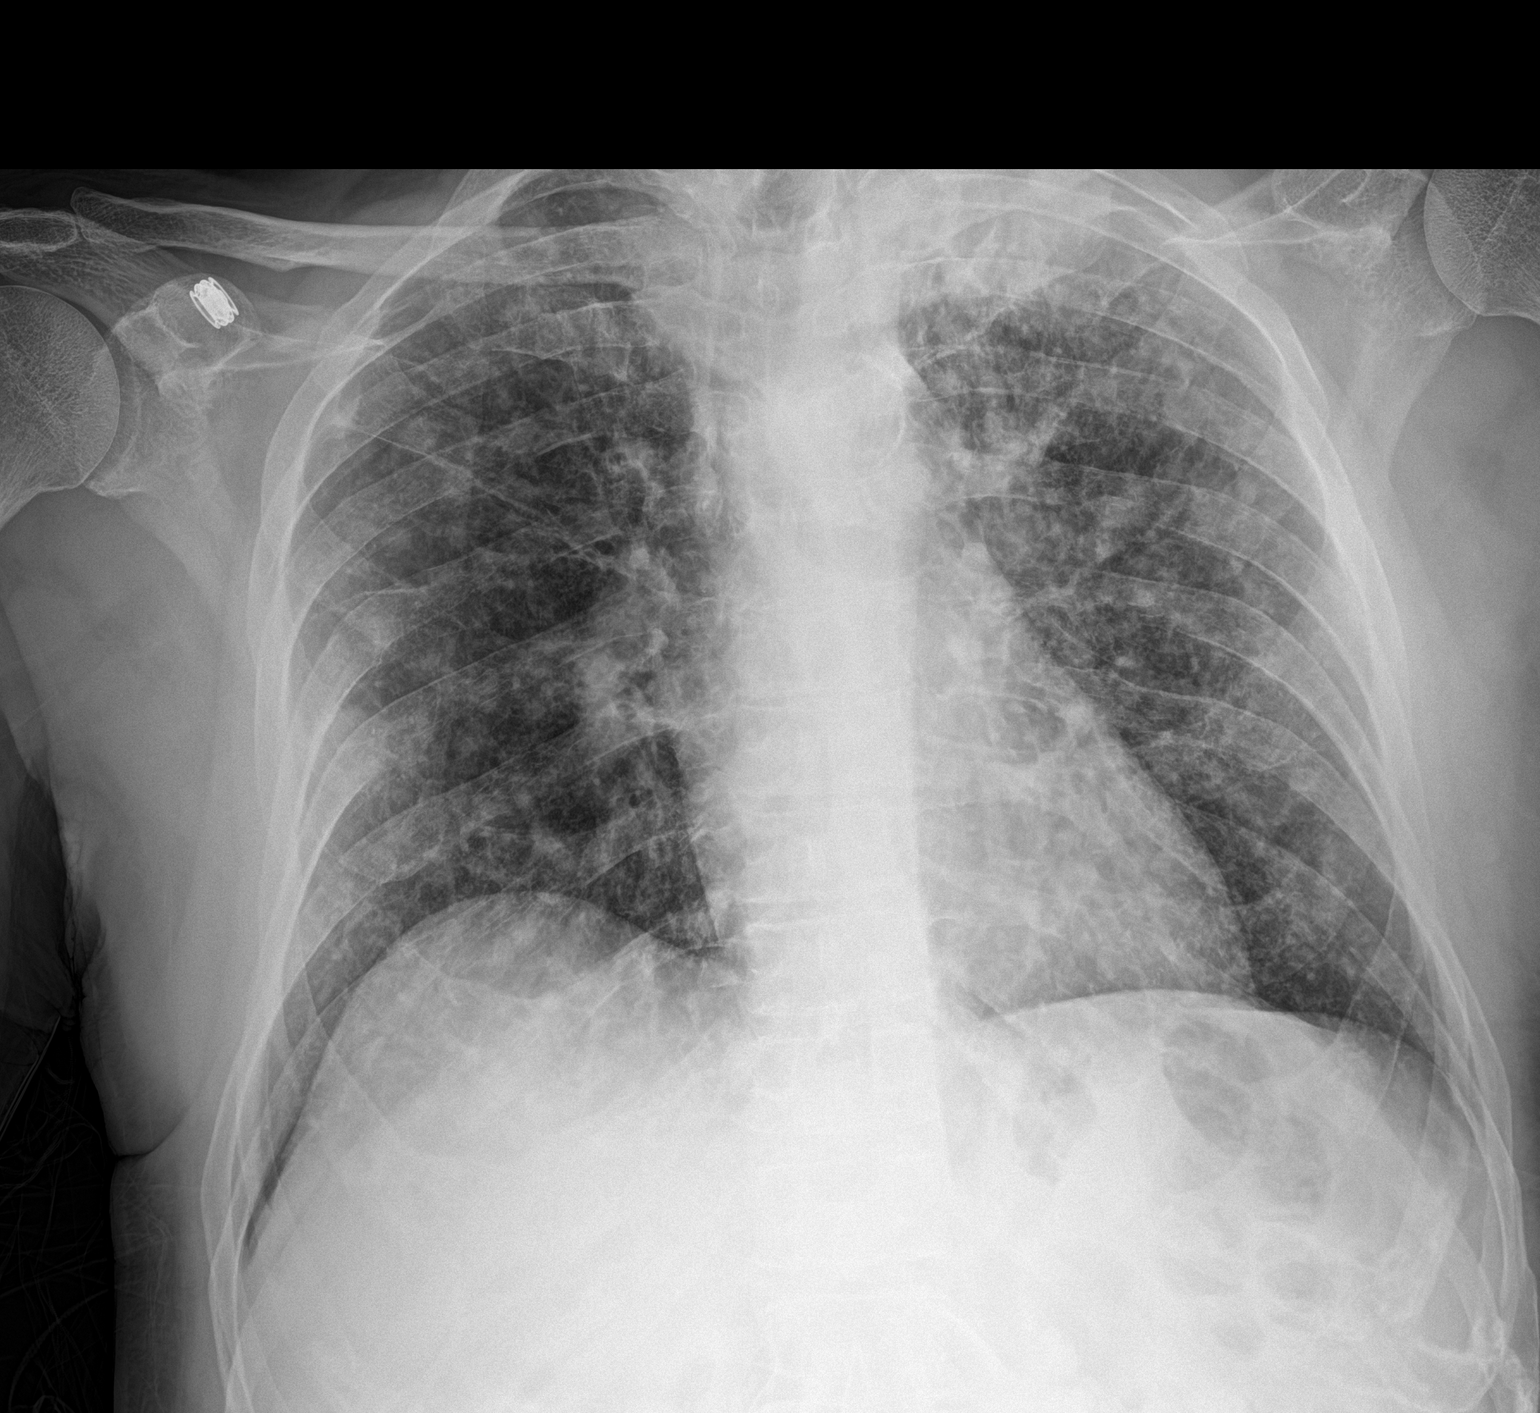

[1 of 1 positions shown; findings below may reference images not displayed]

FINDINGS: Cardiomediastinal silhouette unchanged in size and contour.

Nodular opacities of the bilateral lungs similar to the comparison,
compatible with findings on prior PET CT. Left apical mass, similar
to comparison studies.

No pneumothorax or pleural effusion. No new confluent airspace
disease.
IMPRESSION: Negative for pneumothorax status post left apical mass biopsy.

## 2020-04-21 MED ORDER — SODIUM CHLORIDE 0.9 % IV SOLN: INTRAVENOUS | Status: DC

## 2020-04-21 MED ORDER — MIDAZOLAM HCL 2 MG/2ML IJ SOLN
INTRAMUSCULAR | Status: AC
  Filled 2020-04-21: qty 2

## 2020-04-21 MED ORDER — FENTANYL CITRATE (PF) 100 MCG/2ML IJ SOLN
INTRAMUSCULAR | Status: AC
  Filled 2020-04-21: qty 2

## 2020-04-21 MED ORDER — LIDOCAINE HCL 1 % IJ SOLN
INTRAMUSCULAR | Status: AC
  Filled 2020-04-21: qty 20

## 2020-04-21 MED ORDER — MIDAZOLAM HCL 2 MG/2ML IJ SOLN
INTRAMUSCULAR | Status: AC | PRN
  Administered 2020-04-21: 1 mg via INTRAVENOUS

## 2020-04-21 MED ORDER — FENTANYL CITRATE (PF) 100 MCG/2ML IJ SOLN
INTRAMUSCULAR | Status: AC | PRN
  Administered 2020-04-21: 25 ug via INTRAVENOUS

## 2020-04-21 NOTE — Progress Notes (Signed)
Pt c/o left shoulder pain 5/10, unable to distinguish if it is muscle pain in nature, denies [pain going down arm nor chest, pt continues to point at left shoulder joint. Lennette Bihari AllredPA/ radiology was called and updated.

## 2020-04-21 NOTE — Consult Note (Signed)
Chief Complaint: Patient was seen in consultation today for CT guided left lung mass biopsy  Referring Physician(s): [name],[name]  Supervising Physician: Corrie Mckusick  Patient Status: [facility] - Out-pt  History of Present Illness: Phillip Franklin is an 78 y.o. male , ex smoker, with PMH depression, GERD, HTN, HLD, hypothyroidism as well as newly diagnosed stage IV lung adenocarcinoma. 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   Past Medical History:  Diagnosis Date  . Depression   . GERD (gastroesophageal reflux disease)    occasional, diet controlled  . High cholesterol   . Hypertension    no longer taking medication - states it's undercontrol  . Hypothyroid     Past Surgical History:  Procedure Laterality Date  . BRONCHIAL NEEDLE ASPIRATION BIOPSY  03/24/2020   Procedure: BRONCHIAL NEEDLE ASPIRATION BIOPSIES;  Surgeon: Candee Furbish, MD;  Location: [facility] ENDOSCOPY;  Service: Pulmonary;;  . HEMORRHOID SURGERY    . VIDEO BRONCHOSCOPY WITH ENDOBRONCHIAL ULTRASOUND N/A 03/24/2020   Procedure: VIDEO BRONCHOSCOPY WITH ENDOBRONCHIAL ULTRASOUND;  Surgeon: Candee Furbish, MD;  Location: [facility] ENDOSCOPY;  Service: Pulmonary;  Laterality: N/A;    Allergies: Codeine, Hydrocodone, Oxycodone, and Penicillins  Medications: Prior to Admission medications   Medication Sig Start Date End Date Taking? Authorizing Provider  acetaminophen (TYLENOL) 325 MG tablet Take 325-650 mg by mouth every 6 (six) hours as needed for moderate pain or headache.   Yes [provider]  atorvastatin (LIPITOR) 20 MG tablet Take 20 mg by mouth daily.   Yes [provider]  benzonatate (TESSALON) 200 MG capsule Take 1 capsule (200 mg total) by mouth 4 (four) times daily as needed for cough. 04/14/20  Yes Curt Bears, MD  Coenzyme Q10 (COQ-10) 100 MG CAPS Take 100 mg by mouth every evening.    Yes [provider]  fluconazole (DIFLUCAN) 100 MG tablet Take 1 tablet (100 mg total) by mouth daily. 04/13/20  Yes Curt Bears, MD  folic acid (FOLVITE) 1 MG tablet Take 1 tablet (1 mg total) by mouth daily. 03/30/20  Yes Curt Bears, MD  levothyroxine (SYNTHROID) 88 MCG tablet Take 88 mcg by mouth daily before breakfast.   Yes [provider]  LORazepam (ATIVAN) 0.5 MG tablet Take 1-2 tablets by mouth 30 minutes before procedures, PRN anxiety. 04/17/20  Yes Eppie Gibson, MD  mirtazapine (REMERON) 30 MG tablet Take 1 tablet (30 mg total) by mouth at bedtime. 03/13/20  Yes Curt Bears, MD  prochlorperazine (COMPAZINE) 10 MG tablet Take 1 tablet (10 mg total) by mouth every 6 (six) hours as needed for nausea or vomiting. 03/30/20  Yes Curt Bears, MD  tamsulosin (FLOMAX) 0.4 MG CAPS capsule Take 0.4 mg by mouth every evening.    Yes [provider]  dronabinol (MARINOL) 2.5 MG capsule Take 1 capsule (2.5 mg total) by mouth 2 (two) times daily before lunch and supper. 03/30/20   Curt Bears, MD  esomeprazole (NEXIUM) 20 MG capsule Take 20 mg by mouth daily as needed (acid reflux).    [provider]     Family History  Problem Relation Age of Onset  . Heart disease Father     Social History  Socioeconomic History  . Marital status: Married    Spouse name: Not on file  . Number of children: Not on file  . Years of education: Not on file  . Highest education level: Not on file  Occupational History  . Not on file  Tobacco Use  . Smoking status: Former Smoker    Types: Cigarettes, Pipe    Quit date: 11/13/1977    Years since quitting: 42.4  . Smokeless tobacco: Never Used  Vaping Use  .  Vaping Use: Never used  Substance and Sexual Activity  . Alcohol use: No  . Drug use: No  . Sexual activity: Not Currently  Other Topics Concern  . Not on file  Social History Narrative  . Not on file   Social Determinants of Health   Financial Resource Strain:   . Difficulty of Paying Living Expenses:   Food Insecurity:   . Worried About Charity fundraiser in the Last Year:   . Arboriculturist in the Last Year:   Transportation Needs:   . Film/video editor (Medical):   Marland Kitchen Lack of Transportation (Non-Medical):   Physical Activity:   . Days of Exercise per Week:   . Minutes of Exercise per Session:   Stress:   . Feeling of Stress :   Social Connections:   . Frequency of Communication with Friends and Family:   . Frequency of Social Gatherings with Friends and Family:   . Attends Religious Services:   . Active Member of Clubs or Organizations:   . Attends Archivist Meetings:   Marland Kitchen Marital Status:       Review of Systems denies fever,HA,CP, dyspnea, abd/back pain,N/V or bleeding; he does have occ cough  Vital Signs: BP 133/61   Pulse 82   Temp (!) 97.5 F (36.4 C) (Oral)   Resp 16   Ht 5\' 8"  (1.727 m)   Wt 156 lb (70.8 kg)   SpO2 95%   BMI 23.72 kg/m   Physical Exam awake/alert; chest- CTA bilat; heart- RRR; abd- soft,+BS,NT; no LE edema  Imaging: MR BRAIN W WO CONTRAST  Result Date: 03/29/2020 CLINICAL DATA:  Staging non-small cell lung cancer. EXAM: MRI HEAD WITHOUT AND WITH CONTRAST TECHNIQUE: Multiplanar, multiecho pulse sequences of the brain and surrounding structures were obtained without and with intravenous contrast. CONTRAST:  81mL GADAVIST GADOBUTROL 1 MMOL/ML IV SOLN COMPARISON:  Head CT 08/29/2011 FINDINGS: Brain: Diffusion imaging does not show any acute or subacute infarction or other cause of restricted diffusion. 3 mm metastasis right cerebellum axial image 49. 3.5 mm metastasis left cerebellum axial image 33. 8 mm metastasis left  cerebellum axial image 39. Possible punctate metastasis posterior left cerebellum axial image 41. Punctate metastasis left vermis axial image 44. No metastases identified in either cerebral hemisphere. Meningioma at the right frontal lateral convexity with a diameter of 2 cm and a thickness of 9 mm. No mass-effect upon the brain. Minimal small vessel change of the hemispheric white matter. No cortical or large vessel territory infarction. No hydrocephalus or extra-axial collection. Vascular: Major vessels at the base of the brain show flow. Skull and upper cervical spine: Negative Sinuses/Orbits: Clear/normal Other: None IMPRESSION: Five cerebellar metastases identified as described above. The largest is in the left cerebellum measuring 8 mm in diameter. No mass effect or hemorrhage. No metastases identified in either cerebral hemisphere. Incidental meningioma at the right lateral frontal convexity with a diameter of 2 cm and a thickness of 9  mm. No mass-effect upon the brain. Electronically Signed   By: Nelson Chimes M.D.   On: 03/29/2020 14:46    Labs:  CBC: Recent Labs    04/06/20 1020 04/13/20 1150 04/20/20 1231 04/21/20 0906  WBC 8.8 2.3* 5.0 5.5  HGB 14.3 14.0 12.9* 13.2  HCT 45.3 44.2 40.1 41.8  PLT 275 236 168 220    COAGS: Recent Labs    03/24/20 0805  INR 1.1  APTT 31    BMP: Recent Labs    03/13/20 0810 04/06/20 1020 04/13/20 1150 04/20/20 1231  NA 135 141 138 140  K 4.3 3.5 3.7 3.3*  CL 98 108 103 108  CO2 28 22 24 24   GLUCOSE 100* 176* 121* 130*  BUN 14 14 17  5*  CALCIUM 10.1 10.4* 10.6* 10.6*  CREATININE 1.50* 1.48* 1.41* 1.45*  GFRNONAA 43* 44* 47* 45*  GFRAA 50* 51* 54* 52*    LIVER FUNCTION TESTS: Recent Labs    03/13/20 0810 04/06/20 1020 04/13/20 1150 04/20/20 1231  BILITOT 0.5 0.5 0.8 0.5  AST 23 31 36 27  ALT 26 41 46* 33  ALKPHOS 124 175* 151* 170*  PROT 7.7 7.8 7.8 7.3  ALBUMIN 3.5 3.1* 2.9* 2.9*    TUMOR MARKERS: No results for  input(s): AFPTM, CEA, CA199, CHROMGRNA in the last 8760 hours.  Assessment and Plan: 79 y.o. male , ex smoker, with PMH depression, GERD, HTN, HLD, hypothyroidism as well as newly diagnosed stage IV lung adenocarcinoma. 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  All of the questions were answered and there is agreement to proceed.  Consent signed and in chart.     Thank you for this interesting consult.  I greatly enjoyed meeting Phillip Franklin and look forward to participating in their care.  A copy of this report was sent to the requesting provider on this date.  Electronically Signed: D. Rowe Robert, PA-C 04/21/2020, 9:52 AM   I spent a total of 25 minutes   in face to face in clinical consultation, greater than 50% of which was counseling/coordinating care for image guided left lung mass biopsy

## 2020-04-21 NOTE — Progress Notes (Signed)
Phillip Franklin is having his bx today. I updated Dr. Julien Nordmann.  Per Dr. Julien Nordmann I notified pathology dept of his bx needing to go for [employer] [facility] and PDL 1.

## 2020-04-21 NOTE — Procedures (Signed)
Interventional Radiology Procedure Note  Procedure: CT guided biopsy of left apical mass. Complications: None Recommendations: - Bedrest until CXR cleared.  Minimize talking, coughing or otherwise straining.  - Follow up 1 hr CXR pending  - NPO until CXR cleared  Signed,  Corrie Mckusick, DO

## 2020-04-21 NOTE — Progress Notes (Signed)
Pharmacist Chemotherapy Monitoring - Follow Up Assessment    I verify that I have reviewed each item in the below checklist:  . Regimen for the patient is scheduled for the appropriate day and plan matches scheduled date. Marland Kitchen Appropriate non-routine labs are ordered dependent on drug ordered. . If applicable, additional medications reviewed and ordered per protocol based on lifetime cumulative doses and/or treatment regimen.   Plan for follow-up and/or issues identified: No . I-vent associated with next due treatment: No . MD and/or nursing notified: No  Phillip Franklin 04/21/2020 12:01 PM

## 2020-04-21 NOTE — Discharge Instructions (Signed)
Lung Biopsy, Care After This sheet gives you information about how to care for yourself after your procedure. Your health care provider may also give you more specific instructions depending on the type of biopsy you had. If you have problems or questions, contact your health care provider. What can I expect after the procedure? After the procedure, it is common to have:  A cough.  A sore throat.  Pain where a needle, bronchoscope, or incision was used to collect a biopsy sample (biopsy site). Follow these instructions at home: Medicines  Take over-the-counter and prescription medicines only as told by your health care provider.  Do not drink alcohol if your health care provider tells you not to drink.  Ask your health care provider if the medicine prescribed to you: ? Requires you to avoid driving or using heavy machinery. ? Can cause constipation. You may need to take these actions to prevent or treat constipation:  Drink enough fluid to keep your urine pale yellow.  Take over-the-counter or prescription medicines.  Eat foods that are high in fiber, such as beans, whole grains, and fresh fruits and vegetables.  Limit foods that are high in fat and processed sugars, such as fried or sweet foods.  Do not drive for 24 hours if you were given a sedative. Biopsy site care   Follow instructions from your health care provider about how to take care of your biopsy site. Make sure you: ? Wash your hands with soap and water before and after you change your bandage (dressing). If soap and water are not available, use hand sanitizer. ? Change your dressing as told by your health care provider. ? Leave stitches (sutures), skin glue, or adhesive strips in place. These skin closures may need to stay in place for 2 weeks or longer. If adhesive strip edges start to loosen and curl up, you may trim the loose edges. Do not remove adhesive strips completely unless your health care provider tells  you to do that.  Do not take baths, swim, or use a hot tub until your health care provider approves. Ask your health care provider if you may take showers. You may only be allowed to take sponge baths.  Check your biopsy site every day for signs of infection. Check for: ? Redness, swelling, or more pain. ? Fluid or blood. ? Warmth. ? Pus or a bad smell. General instructions  Return to your normal activities as told by your health care provider. Ask your health care provider what activities are safe for you.  It is up to you to get the results of your procedure. Ask your health care provider, or the department that is doing the procedure, when your results will be ready.  Keep all follow-up visits as told by your health care provider. This is important. Contact a health care provider if:  You have a fever.  You have redness, swelling, or more pain around your biopsy site.  You have fluid or blood coming from your biopsy site.  Your biopsy site feels warm to the touch.  You have pus or a bad smell coming from your biopsy site.  You have pain that does not get better with medicine. Get help right away if:  You cough up blood.  You have trouble breathing.  You have chest pain.  You lose consciousness. Summary  After the procedure, it is common to have a sore throat and a cough.  Return to your normal activities as told by your  health care provider. Ask your health care provider what activities are safe for you.  Take over-the-counter and prescription medicines only as told by your health care provider.  Report any unusual symptoms to your health care provider. This information is not intended to replace advice given to you by your health care provider. Make sure you discuss any questions you have with your health care provider. Document Revised: 10/10/2018 Document Reviewed: 10/04/2016 Elsevier Patient Education  [employer].

## 2020-04-22 LAB — SURGICAL PATHOLOGY

## 2020-04-23 ENCOUNTER — Other Ambulatory Visit: Payer: Self-pay

## 2020-04-23 ENCOUNTER — Ambulatory Visit
Admission: RE | Admit: 2020-04-23 | Discharge: 2020-04-23 | Disposition: A | Discharge status: Home / Self Care | Payer: Medicare PPO | Source: Ambulatory Visit | Attending: Radiation Oncology | Admitting: Radiation Oncology

## 2020-04-23 DIAGNOSIS — C7931 Secondary malignant neoplasm of brain: Secondary | ICD-10-CM | POA: Diagnosis not present

## 2020-04-23 DIAGNOSIS — C3490 Malignant neoplasm of unspecified part of unspecified bronchus or lung: Secondary | ICD-10-CM | POA: Diagnosis not present

## 2020-04-23 MED ORDER — GADOBENATE DIMEGLUMINE 529 MG/ML IV SOLN
15.0000 mL | Freq: Once | INTRAVENOUS | Status: AC | PRN
  Administered 2020-04-23: 15 mL via INTRAVENOUS

## 2020-04-24 ENCOUNTER — Ambulatory Visit: Payer: Medicare PPO | Admitting: Radiation Oncology

## 2020-04-24 ENCOUNTER — Ambulatory Visit
Admission: RE | Admit: 2020-04-24 | Discharge: 2020-04-24 | Disposition: A | Discharge status: Home / Self Care | Payer: Medicare PPO | Source: Ambulatory Visit | Attending: Radiation Oncology | Admitting: Radiation Oncology

## 2020-04-24 ENCOUNTER — Ambulatory Visit: Admission: RE | Admit: 2020-04-24 | Payer: Medicare PPO | Source: Ambulatory Visit | Admitting: Radiation Oncology

## 2020-04-24 ENCOUNTER — Other Ambulatory Visit: Payer: Self-pay | Admitting: Radiation Therapy

## 2020-04-24 ENCOUNTER — Other Ambulatory Visit: Payer: Self-pay

## 2020-04-24 VITALS — BP 135/60 | HR 91 | Temp 98.2°F | Resp 18 | Ht 68.0 in | Wt 157.4 lb

## 2020-04-24 DIAGNOSIS — C3412 Malignant neoplasm of upper lobe, left bronchus or lung: Secondary | ICD-10-CM | POA: Insufficient documentation

## 2020-04-24 DIAGNOSIS — Z510 Encounter for antineoplastic radiation therapy: Secondary | ICD-10-CM | POA: Diagnosis not present

## 2020-04-24 DIAGNOSIS — C7931 Secondary malignant neoplasm of brain: Secondary | ICD-10-CM | POA: Insufficient documentation

## 2020-04-24 DIAGNOSIS — C7949 Secondary malignant neoplasm of other parts of nervous system: Secondary | ICD-10-CM

## 2020-04-24 MED ORDER — SODIUM CHLORIDE 0.9% FLUSH
10.0000 mL | Freq: Once | INTRAVENOUS | Status: AC
  Administered 2020-04-24: 10 mL via INTRAVENOUS

## 2020-04-24 NOTE — Progress Notes (Signed)
Has armband been applied?  Yes.    Does patient have an allergy to IV contrast dye?: No.   Has patient ever received premedication for IV contrast dye?: No.   Does patient take metformin?: No.  Date of lab work: April 20, 2020 BUN: 5 CR: 1.45  IV site: forearm left, condition patent and no redness  Has IV site been added to flowsheet?  Yes.    Vitals:   04/24/20 1305  BP: 135/60  Pulse: 91  Resp: 18  Temp: 98.2 F (36.8 C)  SpO2: 99%

## 2020-04-27 ENCOUNTER — Inpatient Hospital Stay: Payer: Medicare PPO | Admitting: Internal Medicine

## 2020-04-27 ENCOUNTER — Inpatient Hospital Stay: Payer: Medicare PPO

## 2020-04-27 ENCOUNTER — Other Ambulatory Visit: Payer: Self-pay

## 2020-04-27 ENCOUNTER — Encounter: Payer: Self-pay | Admitting: Internal Medicine

## 2020-04-27 ENCOUNTER — Other Ambulatory Visit: Payer: Self-pay | Admitting: Internal Medicine

## 2020-04-27 ENCOUNTER — Encounter: Payer: Self-pay | Admitting: Pharmacy Technician

## 2020-04-27 VITALS — BP 130/67 | HR 90 | Temp 98.2°F | Resp 17 | Ht 68.0 in | Wt 156.8 lb

## 2020-04-27 DIAGNOSIS — R634 Abnormal weight loss: Secondary | ICD-10-CM | POA: Diagnosis not present

## 2020-04-27 DIAGNOSIS — R05 Cough: Secondary | ICD-10-CM | POA: Diagnosis not present

## 2020-04-27 DIAGNOSIS — C7951 Secondary malignant neoplasm of bone: Secondary | ICD-10-CM | POA: Diagnosis not present

## 2020-04-27 DIAGNOSIS — Z5111 Encounter for antineoplastic chemotherapy: Secondary | ICD-10-CM | POA: Diagnosis not present

## 2020-04-27 DIAGNOSIS — C3490 Malignant neoplasm of unspecified part of unspecified bronchus or lung: Secondary | ICD-10-CM | POA: Diagnosis not present

## 2020-04-27 DIAGNOSIS — C7931 Secondary malignant neoplasm of brain: Secondary | ICD-10-CM

## 2020-04-27 DIAGNOSIS — E039 Hypothyroidism, unspecified: Secondary | ICD-10-CM | POA: Diagnosis not present

## 2020-04-27 DIAGNOSIS — Z5112 Encounter for antineoplastic immunotherapy: Secondary | ICD-10-CM

## 2020-04-27 DIAGNOSIS — C3492 Malignant neoplasm of unspecified part of left bronchus or lung: Secondary | ICD-10-CM | POA: Diagnosis not present

## 2020-04-27 DIAGNOSIS — R630 Anorexia: Secondary | ICD-10-CM | POA: Diagnosis not present

## 2020-04-27 LAB — CMP (CANCER CENTER ONLY)
ALT: 26 U/L (ref 0–44)
AST: 26 U/L (ref 15–41)
Albumin: 2.8 g/dL — ABNORMAL LOW (ref 3.5–5.0)
Alkaline Phosphatase: 166 U/L — ABNORMAL HIGH (ref 38–126)
Anion gap: 8 (ref 5–15)
BUN: 7 mg/dL — ABNORMAL LOW (ref 8–23)
CO2: 22 mmol/L (ref 22–32)
Calcium: 10.2 mg/dL (ref 8.9–10.3)
Chloride: 110 mmol/L (ref 98–111)
Creatinine: 1.32 mg/dL — ABNORMAL HIGH (ref 0.61–1.24)
GFR, Est AFR Am: 59 mL/min — ABNORMAL LOW (ref >60)
GFR, Estimated: 51 mL/min — ABNORMAL LOW (ref >60)
Glucose, Bld: 149 mg/dL — ABNORMAL HIGH (ref 70–99)
Potassium: 3.4 mmol/L — ABNORMAL LOW (ref 3.5–5.1)
Sodium: 140 mmol/L (ref 135–145)
Total Bilirubin: 0.4 mg/dL (ref 0.3–1.2)
Total Protein: 7.3 g/dL (ref 6.5–8.1)

## 2020-04-27 LAB — CBC WITH DIFFERENTIAL (CANCER CENTER ONLY)
Abs Immature Granulocytes: 0.1 10*3/uL — ABNORMAL HIGH (ref 0.00–0.07)
Basophils Absolute: 0.1 10*3/uL (ref 0.0–0.1)
Basophils Relative: 2 %
Eosinophils Absolute: 0.2 10*3/uL (ref 0.0–0.5)
Eosinophils Relative: 3 %
HCT: 39.6 % (ref 39.0–52.0)
Hemoglobin: 12.6 g/dL — ABNORMAL LOW (ref 13.0–17.0)
Immature Granulocytes: 2 %
Lymphocytes Relative: 26 %
Lymphs Abs: 1.6 10*3/uL (ref 0.7–4.0)
MCH: 28.1 pg (ref 26.0–34.0)
MCHC: 31.8 g/dL (ref 30.0–36.0)
MCV: 88.4 fL (ref 80.0–100.0)
Monocytes Absolute: 1 10*3/uL (ref 0.1–1.0)
Monocytes Relative: 16 %
Neutro Abs: 3.2 10*3/uL (ref 1.7–7.7)
Neutrophils Relative %: 51 %
Platelet Count: 402 10*3/uL — ABNORMAL HIGH (ref 150–400)
RBC: 4.48 MIL/uL (ref 4.22–5.81)
RDW: 16 % — ABNORMAL HIGH (ref 11.5–15.5)
WBC Count: 6.2 10*3/uL (ref 4.0–10.5)
nRBC: 0 % (ref 0.0–0.2)

## 2020-04-27 MED ORDER — SODIUM CHLORIDE 0.9 % IV SOLN
200.0000 mg | Freq: Once | INTRAVENOUS | Status: AC
  Administered 2020-04-27: 200 mg via INTRAVENOUS
  Filled 2020-04-27: qty 8

## 2020-04-27 MED ORDER — SODIUM CHLORIDE 0.9 % IV SOLN
353.0000 mg | Freq: Once | INTRAVENOUS | Status: AC
  Administered 2020-04-27: 350 mg via INTRAVENOUS
  Filled 2020-04-27: qty 35

## 2020-04-27 MED ORDER — SODIUM CHLORIDE 0.9 % IV SOLN
150.0000 mg | Freq: Once | INTRAVENOUS | Status: AC
  Administered 2020-04-27: 150 mg via INTRAVENOUS
  Filled 2020-04-27: qty 150

## 2020-04-27 MED ORDER — SODIUM CHLORIDE 0.9 % IV SOLN
10.0000 mg | Freq: Once | INTRAVENOUS | Status: AC
  Administered 2020-04-27: 10 mg via INTRAVENOUS
  Filled 2020-04-27: qty 10

## 2020-04-27 MED ORDER — SODIUM CHLORIDE 0.9 % IV SOLN
500.0000 mg/m2 | Freq: Once | INTRAVENOUS | Status: AC
  Administered 2020-04-27: 900 mg via INTRAVENOUS
  Filled 2020-04-27: qty 20

## 2020-04-27 MED ORDER — SODIUM CHLORIDE 0.9 % IV SOLN
Freq: Once | INTRAVENOUS | Status: AC
  Filled 2020-04-27: qty 250

## 2020-04-27 MED ORDER — PALONOSETRON HCL INJECTION 0.25 MG/5ML
0.2500 mg | Freq: Once | INTRAVENOUS | Status: AC
  Administered 2020-04-27: 0.25 mg via INTRAVENOUS

## 2020-04-27 NOTE — Patient Instructions (Signed)
[facility] Discharge Instructions for Patients Receiving Chemotherapy  Today you received the following chemotherapy agents: Keytruda, Alimta, Carboplatin  To help prevent nausea and vomiting after your treatment, we encourage you to take your nausea medication as directed.    If you develop nausea and vomiting that is not controlled by your nausea medication, call the clinic.   BELOW ARE SYMPTOMS THAT SHOULD BE REPORTED IMMEDIATELY:  *FEVER GREATER THAN 100.5 F  *CHILLS WITH OR WITHOUT FEVER  NAUSEA AND VOMITING THAT IS NOT CONTROLLED WITH YOUR NAUSEA MEDICATION  *UNUSUAL SHORTNESS OF BREATH  *UNUSUAL BRUISING OR BLEEDING  TENDERNESS IN MOUTH AND THROAT WITH OR WITHOUT PRESENCE OF ULCERS  *URINARY PROBLEMS  *BOWEL PROBLEMS  UNUSUAL RASH Items with * indicate a potential emergency and should be followed up as soon as possible.  Feel free to call the clinic should you have any questions or concerns. The clinic phone number is [phone].  Please show the [facility] at check-in to the Emergency Department and triage nurse.

## 2020-04-27 NOTE — Progress Notes (Addendum)
Patient has been approved for drug assistance by [employer] for Alimta. The enrollment period is from 04/07/20-09/18/20 based on EOOP. First DOS covered is 04/06/20.

## 2020-04-27 NOTE — Progress Notes (Signed)
[place] [phone]   [phone]  OFFICE PROGRESS NOTE  Seward Carol, MD [address] [place] [address]  DIAGNOSIS: Stage IV (T4, N3, M1c) non-small cell lung cancer, adenocarcinoma presented with left upper lobe lung mass in addition to bilateral mediastinal lymphadenopathy as well as bilateral pulmonary nodules and metastatic disease to the bones and brain diagnosed in July 2021.  Molecular studies by [facility] [employer] showed no actionable mutations.  PRIOR THERAPY: None  CURRENT THERAPY: Systemic chemotherapy with carboplatin for AUC of 5, Alimta 500 mg/M2 and Keytruda 200 mg IV every 3 weeks.  First dose 04/06/2020.  Status post 1 cycle.  INTERVAL HISTORY: Phillip Franklin 79 y.o. male returns to the clinic today for follow-up visit accompanied by his wife.  His daughter [name] was available by phone during the visit.  The patient is feeling fine today except for fatigue and depressed mood.  He tolerated the first cycle of his chemotherapy fairly well with no concerning adverse effects.  He denied having any nausea, vomiting, diarrhea or constipation.  He has no current chest pain but has shortness of breath with exertion with mild cough but no hemoptysis.  He denied having any recent weight loss.  He actually gained 2 pounds since his last visit.  He refused to take the Marinol.  [name] scheduled for SRS to the brain metastasis on 05/04/2020.  The patient is here today for evaluation before starting cycle #2 of his treatment.  MEDICAL HISTORY: Past Medical History:  Diagnosis Date   Depression    GERD (gastroesophageal reflux disease)    occasional, diet controlled   High cholesterol    Hypertension    no longer taking medication - states it's undercontrol   Hypothyroid     ALLERGIES:  is allergic to codeine, hydrocodone, oxycodone, and penicillins.  MEDICATIONS:  Current Outpatient Medications  Medication Sig  Dispense Refill   acetaminophen (TYLENOL) 325 MG tablet Take 325-650 mg by mouth every 6 (six) hours as needed for moderate pain or headache.     atorvastatin (LIPITOR) 20 MG tablet Take 20 mg by mouth daily.     benzonatate (TESSALON) 200 MG capsule Take 1 capsule (200 mg total) by mouth 4 (four) times daily as needed for cough. 60 capsule 1   Coenzyme Q10 (COQ-10) 100 MG CAPS Take 100 mg by mouth every evening.      esomeprazole (NEXIUM) 20 MG capsule Take 20 mg by mouth daily as needed (acid reflux).     fluconazole (DIFLUCAN) 100 MG tablet Take 1 tablet (100 mg total) by mouth daily. 10 tablet 0   folic acid (FOLVITE) 1 MG tablet Take 1 tablet (1 mg total) by mouth daily. 30 tablet 4   levothyroxine (SYNTHROID) 88 MCG tablet Take 88 mcg by mouth daily before breakfast.     LORazepam (ATIVAN) 0.5 MG tablet Take 1-2 tablets by mouth 30 minutes before procedures, PRN anxiety. 8 tablet 0   mirtazapine (REMERON) 30 MG tablet Take 1 tablet (30 mg total) by mouth at bedtime. 30 tablet 2   prochlorperazine (COMPAZINE) 10 MG tablet Take 1 tablet (10 mg total) by mouth every 6 (six) hours as needed for nausea or vomiting. 30 tablet 0   tamsulosin (FLOMAX) 0.4 MG CAPS capsule Take 0.4 mg by mouth every evening.      No current facility-administered medications for this visit.    SURGICAL HISTORY:  Past Surgical History:  Procedure Laterality Date   BRONCHIAL NEEDLE ASPIRATION BIOPSY  03/24/2020   Procedure: BRONCHIAL NEEDLE ASPIRATION BIOPSIES;  Surgeon: Candee Furbish, MD;  Location: [facility] ENDOSCOPY;  Service: Pulmonary;;   HEMORRHOID SURGERY     VIDEO BRONCHOSCOPY WITH ENDOBRONCHIAL ULTRASOUND N/A 03/24/2020   Procedure: VIDEO BRONCHOSCOPY WITH ENDOBRONCHIAL ULTRASOUND;  Surgeon: Candee Furbish, MD;  Location: [facility] ENDOSCOPY;  Service: Pulmonary;  Laterality: N/A;    REVIEW OF SYSTEMS:  Constitutional: positive for fatigue Eyes: negative Ears, nose, mouth, throat, and face:  negative Respiratory: positive for cough and dyspnea on exertion Cardiovascular: negative Gastrointestinal: negative Genitourinary:negative Integument/breast: negative Hematologic/lymphatic: negative Musculoskeletal:negative Neurological: negative Behavioral/Psych: positive for depression Endocrine: negative Allergic/Immunologic: negative   PHYSICAL EXAMINATION: General appearance: alert, cooperative, fatigued and no distress Head: Normocephalic, without obvious abnormality, atraumatic Neck: no adenopathy, no JVD, supple, symmetrical, trachea midline and thyroid not enlarged, symmetric, no tenderness/mass/nodules Lymph nodes: Cervical, supraclavicular, and axillary nodes normal. Resp: clear to auscultation bilaterally Back: symmetric, no curvature. ROM normal. No CVA tenderness. Cardio: regular rate and rhythm, S1, S2 normal, no murmur, click, rub or gallop GI: soft, non-tender; bowel sounds normal; no masses,  no organomegaly Extremities: extremities normal, atraumatic, no cyanosis or edema Neurologic: Alert and oriented X 3, normal strength and tone. Normal symmetric reflexes. Normal coordination and gait  ECOG PERFORMANCE STATUS: 1 - Symptomatic but completely ambulatory  Blood pressure 130/67, pulse 90, temperature 98.2 F (36.8 C), temperature source Tympanic, resp. rate 17, height 5\' 8"  (1.727 m), weight 156 lb 12.8 oz (71.1 kg), SpO2 98 %.  LABORATORY DATA: Lab Results  Component Value Date   WBC 6.2 04/27/2020   HGB 12.6 (L) 04/27/2020   HCT 39.6 04/27/2020   MCV 88.4 04/27/2020   PLT 402 (H) 04/27/2020      Chemistry      Component Value Date/Time   NA 140 04/20/2020 1231   K 3.3 (L) 04/20/2020 1231   CL 108 04/20/2020 1231   CO2 24 04/20/2020 1231   BUN 5 (L) 04/20/2020 1231   CREATININE 1.45 (H) 04/20/2020 1231      Component Value Date/Time   CALCIUM 10.6 (H) 04/20/2020 1231   ALKPHOS 170 (H) 04/20/2020 1231   AST 27 04/20/2020 1231   ALT 33 04/20/2020  1231   BILITOT 0.5 04/20/2020 1231       RADIOGRAPHIC STUDIES: MR BRAIN W WO CONTRAST  Addendum Date: 04/24/2020   ADDENDUM REPORT: 04/24/2020 16:11 ADDENDUM: The case was reviewed with Dr. Isidore Moos. There is an additional enhancing lesion in the left cerebellum which was not dictated in the original report. 3 mm lesion in the left medial cerebellum axial image 40 also is consistent with metastatic disease. This is marked with an arrow. In summary, there is 1 metastatic deposit in the right cerebellum. There are 4 definite lesions in the left cerebellum with a probable additional lesion in the left lateral cerebellum on image 39, for total of 6 metastatic deposits in the posterior fossa. Electronically Signed   By: Franchot Gallo M.D.   On: 04/24/2020 16:11   Result Date: 04/24/2020 CLINICAL DATA:  Lung cancer metastatic to brain. SRS treatment planning EXAM: MRI HEAD WITHOUT AND WITH CONTRAST TECHNIQUE: Multiplanar, multiecho pulse sequences of the brain and surrounding structures were obtained without and with intravenous contrast. CONTRAST:  68mL MULTIHANCE GADOBENATE DIMEGLUMINE 529 MG/ML IV SOLN COMPARISON:  MRI head 03/27/2020 FINDINGS: Brain: Multiple enhancing lesions in the cerebellum bilaterally compatible with metastatic disease. 3 mm lesion right superior  cerebellum axial image 45 2.5 mm enhancing lesion left inferolateral cerebellum axial image 23. This lesion is not seen on the prior study. 7 mm rim enhancing necrotic lesion left cerebellum axial image 36 is unchanged. 3 mm enhancing lesion left anterior cerebellum axial image 36 is unchanged 1.5 mm enhancement left lateral cerebellum axial image 39 possible metastatic disease versus vessel. This is not seen on the prior study. Prior study indicated a small area of enhancement in the left posterior cerebellum. On today's study this has the appearance of a small vessel No supratentorial metastatic deposits. 6 x 19 mm meningioma right frontal  convexity is unchanged. 4 mm dural base calcification left frontal convexity is unchanged. This shows increased signal on T1 and does not enhance. Possible meningioma or benign dural calcification. Mild atrophy without hydrocephalus or midline shift. Negative for acute infarct. No significant cerebellar edema. Negative for intracranial hemorrhage. Vascular: Normal arterial flow voids. Skull and upper cervical spine: No worrisome skeletal lesions. Sinuses/Orbits: Mild mucosal edema paranasal sinuses. Negative orbit Other: None IMPRESSION: Enhancing lesions and cerebellum compatible with metastatic disease. Largest lesion left cerebellum 7 mm unchanged. No significant edema. No supratentorial metastatic disease identified 6 x 19 mm meningioma right frontal convexity unchanged. 4 mm left frontal convexity dural calcification unchanged. Electronically Signed: By: Franchot Gallo M.D. On: 04/23/2020 12:46   CT Biopsy  Result Date: 04/21/2020 INDICATION: 79 year old male with a history of lung carcinoma, referred for additional tissue sample for molecular studies EXAM: CT BIOPSY MEDICATIONS: None. ANESTHESIA/SEDATION: Moderate (conscious) sedation was employed during this procedure. A total of Versed 1.0 mg and Fentanyl 25 mcg was administered intravenously. Moderate Sedation Time: 11 minutes. The patient's level of consciousness and vital signs were monitored continuously by radiology nursing throughout the procedure under my direct supervision. FLUOROSCOPY TIME:  CT COMPLICATIONS: None PROCEDURE: The procedure, risks, benefits, and alternatives were explained to the patient and the patient's family. Specific risks that were addressed included bleeding, infection, pneumothorax, need for further procedure including chest tube placement, chance of delayed pneumothorax or hemorrhage, hemoptysis, nondiagnostic sample, cardiopulmonary collapse, death. Questions regarding the procedure were encouraged and answered. The  patient understands and consents to the procedure. Patient was positioned in the left decubitus position on the CT gantry table and a scout CT of the chest was performed for planning purposes. Once angle of approach was determined, the skin and subcutaneous tissues this scan was prepped and draped in the usual sterile fashion, and a sterile drape was applied covering the operative field. A sterile gown and sterile gloves were used for the procedure. Local anesthesia was provided with 1% Lidocaine. The skin and subcutaneous tissues were infiltrated 1% lidocaine for local anesthesia, and a small stab incision was made with an 11 blade scalpel. Using CT guidance, a 17 gauge trocar needle was advanced into the left upper lobetarget. After confirmation of the tip, separate 18 gauge core biopsies were performed. These were placed into solution for transportation to the lab. Biosentry Device was deployed. A final CT image was performed. Patient tolerated the procedure well and remained hemodynamically stable throughout. No complications were encountered and no significant blood loss was encounter IMPRESSION: Status post CT-guided biopsy of left upper lobe FDG avid lesion. Tissue specimen sent to pathology for complete histopathologic analysis. Signed, Dulcy Fanny. Dellia Nims, RPVI [facility] Radiology Electronically Signed   By: Corrie Mckusick D.O.   On: 04/21/2020 13:46   DG Chest Port 1 View  Result Date:  04/21/2020 CLINICAL DATA:  79 year old male status post left apical lung mass biopsy EXAM: PORTABLE CHEST 1 VIEW COMPARISON:  02/21/2020 FINDINGS: Cardiomediastinal silhouette unchanged in size and contour. Nodular opacities of the bilateral lungs similar to the comparison, compatible with findings on prior PET CT. Left apical mass, similar to comparison studies. No pneumothorax or pleural effusion. No new confluent airspace disease. IMPRESSION: Negative for pneumothorax  status post left apical mass biopsy. Electronically Signed   By: Corrie Mckusick D.O.   On: 04/21/2020 13:47    ASSESSMENT AND PLAN: This is a very pleasant 79 years old white male recently diagnosed with a stage IV (T4, N3, M1C) non-small cell lung cancer, adenocarcinoma presented with left upper lobe lung mass in addition to bilateral pulmonary nodules as well as bilateral hilar and mediastinal lymphadenopathy and metastatic disease to the bones and brain diagnosed in July 2021. Unfortunately the molecular studies by [employer] was negative for actionable mutations.  The initial tissue biopsy was insufficient for molecular studies by foundation 1. The patient had repeat biopsy for the molecular studies and the expectation is to have the result on 05/05/2020. He started systemic chemotherapy with carboplatin for AUC of 5, Alimta 500 mg/M2 and Keytruda 200 mg IV every 3 weeks status post 1 cycle.  He tolerated the first cycle of his treatment well with no concerning adverse effects. I recommended for the patient to proceed with cycle #2 today as planned. For the lack of appetite and weight loss, he will continue his treatment with Remeron.  He declined to use Marinol. For the dry cough he will continue on Tessalon Perles. For the subcentimeter brain metastasis, he is expected to have SRS done next week. I will see the patient back for follow-up visit in 3 weeks for evaluation before the next cycle of his treatment. The patient was advised to call immediately if he has any concerning symptoms in the interval. Disclaimer: This note was dictated with voice recognition software. Similar sounding words can inadvertently be transcribed and may not be corrected upon review.

## 2020-04-27 NOTE — Progress Notes (Signed)
Patient has been approved for drug assistance by [employer] for [employer]. The enrollment period is from 04/09/20-09/18/20 based on EOOP. First DOS covered is 04/27/20.

## 2020-04-28 ENCOUNTER — Encounter (HOSPITAL_COMMUNITY): Payer: Self-pay | Admitting: Internal Medicine

## 2020-04-28 ENCOUNTER — Encounter: Payer: Self-pay | Admitting: *Deleted

## 2020-04-28 NOTE — Progress Notes (Signed)
[place] Work  Clinical Social Work received referral from radiation oncology to emotional support.  CSW contact patient and patients wife at home.  Patients wife stated overall they were doing ok and managing, and felt the visit with medical oncology this week was helpful.  Patients wife identified her children and neighbors as positive support.  Patients wife did express some distress coping with symptoms and side effect.  CSW and patients wife reviewed resources at [facility] including support programs.  Patients wife expressed interest in the art programs.  CSW sent an e-mail with support program information for the month of August, as well as added her to the monthly mailing list.  CSW reviewed counseling options at [facility] and in the community.  CSW also shared caregiver support resources through [place].  CSW and patients wife reviewed transportation resources, and patients wife knows to contact CSW if she has and transportation concerns.  CSW and patients wife also discussed home care and services they provide.  Patients wife expressed much appreciation for the treatment team at [facility] and knows to contact CSW with needs or concerns.          Johnnye Lana, MSW, LCSW, OSW-C Clinical Social Worker [facility] [phone]

## 2020-04-29 ENCOUNTER — Encounter: Payer: Self-pay | Admitting: *Deleted

## 2020-04-29 NOTE — Progress Notes (Signed)
I followed up on [facility] results. The portal showed results will be completed on 05/05/20.  I notified Dr. Julien Nordmann.

## 2020-04-30 DIAGNOSIS — C7931 Secondary malignant neoplasm of brain: Secondary | ICD-10-CM | POA: Diagnosis not present

## 2020-05-01 ENCOUNTER — Encounter (HOSPITAL_COMMUNITY): Payer: Self-pay | Admitting: Internal Medicine

## 2020-05-01 DIAGNOSIS — C3491 Malignant neoplasm of unspecified part of right bronchus or lung: Secondary | ICD-10-CM | POA: Diagnosis not present

## 2020-05-04 ENCOUNTER — Ambulatory Visit
Admission: RE | Admit: 2020-05-04 | Discharge: 2020-05-04 | Disposition: A | Discharge status: Home / Self Care | Payer: Medicare PPO | Source: Ambulatory Visit | Attending: Radiation Oncology | Admitting: Radiation Oncology

## 2020-05-04 ENCOUNTER — Ambulatory Visit: Payer: Medicare PPO | Admitting: Radiation Oncology

## 2020-05-04 ENCOUNTER — Other Ambulatory Visit: Payer: Self-pay

## 2020-05-04 ENCOUNTER — Encounter: Payer: Self-pay | Admitting: Radiation Oncology

## 2020-05-04 ENCOUNTER — Inpatient Hospital Stay: Payer: Medicare PPO

## 2020-05-04 VITALS — BP 130/59 | HR 80 | Temp 98.6°F | Resp 16

## 2020-05-04 DIAGNOSIS — Z510 Encounter for antineoplastic radiation therapy: Secondary | ICD-10-CM | POA: Diagnosis not present

## 2020-05-04 DIAGNOSIS — C7931 Secondary malignant neoplasm of brain: Secondary | ICD-10-CM

## 2020-05-04 DIAGNOSIS — Z5112 Encounter for antineoplastic immunotherapy: Secondary | ICD-10-CM | POA: Diagnosis not present

## 2020-05-04 DIAGNOSIS — C3492 Malignant neoplasm of unspecified part of left bronchus or lung: Secondary | ICD-10-CM

## 2020-05-04 DIAGNOSIS — C3412 Malignant neoplasm of upper lobe, left bronchus or lung: Secondary | ICD-10-CM | POA: Diagnosis not present

## 2020-05-04 DIAGNOSIS — R630 Anorexia: Secondary | ICD-10-CM | POA: Diagnosis not present

## 2020-05-04 DIAGNOSIS — C7951 Secondary malignant neoplasm of bone: Secondary | ICD-10-CM | POA: Diagnosis not present

## 2020-05-04 DIAGNOSIS — C3490 Malignant neoplasm of unspecified part of unspecified bronchus or lung: Secondary | ICD-10-CM | POA: Diagnosis not present

## 2020-05-04 DIAGNOSIS — R634 Abnormal weight loss: Secondary | ICD-10-CM | POA: Diagnosis not present

## 2020-05-04 DIAGNOSIS — R05 Cough: Secondary | ICD-10-CM | POA: Diagnosis not present

## 2020-05-04 LAB — CMP (CANCER CENTER ONLY)
ALT: 40 U/L (ref 0–44)
AST: 38 U/L (ref 15–41)
Albumin: 2.7 g/dL — ABNORMAL LOW (ref 3.5–5.0)
Alkaline Phosphatase: 158 U/L — ABNORMAL HIGH (ref 38–126)
Anion gap: 8 (ref 5–15)
BUN: 10 mg/dL (ref 8–23)
CO2: 23 mmol/L (ref 22–32)
Calcium: 9.7 mg/dL (ref 8.9–10.3)
Chloride: 106 mmol/L (ref 98–111)
Creatinine: 1.14 mg/dL (ref 0.61–1.24)
GFR, Est AFR Am: >60 mL/min (ref >60)
GFR, Estimated: >60 mL/min (ref >60)
Glucose, Bld: 124 mg/dL — ABNORMAL HIGH (ref 70–99)
Potassium: 3.7 mmol/L (ref 3.5–5.1)
Sodium: 137 mmol/L (ref 135–145)
Total Bilirubin: 0.7 mg/dL (ref 0.3–1.2)
Total Protein: 7.1 g/dL (ref 6.5–8.1)

## 2020-05-04 LAB — CBC WITH DIFFERENTIAL (CANCER CENTER ONLY)
Abs Immature Granulocytes: 0.01 10*3/uL (ref 0.00–0.07)
Basophils Absolute: 0.1 10*3/uL (ref 0.0–0.1)
Basophils Relative: 2 %
Eosinophils Absolute: 0 10*3/uL (ref 0.0–0.5)
Eosinophils Relative: 1 %
HCT: 35.9 % — ABNORMAL LOW (ref 39.0–52.0)
Hemoglobin: 11.7 g/dL — ABNORMAL LOW (ref 13.0–17.0)
Immature Granulocytes: 0 %
Lymphocytes Relative: 53 %
Lymphs Abs: 1.4 10*3/uL (ref 0.7–4.0)
MCH: 28.9 pg (ref 26.0–34.0)
MCHC: 32.6 g/dL (ref 30.0–36.0)
MCV: 88.6 fL (ref 80.0–100.0)
Monocytes Absolute: 0.1 10*3/uL (ref 0.1–1.0)
Monocytes Relative: 4 %
Neutro Abs: 1.1 10*3/uL — ABNORMAL LOW (ref 1.7–7.7)
Neutrophils Relative %: 40 %
Platelet Count: 168 10*3/uL (ref 150–400)
RBC: 4.05 MIL/uL — ABNORMAL LOW (ref 4.22–5.81)
RDW: 15.5 % (ref 11.5–15.5)
WBC Count: 2.7 10*3/uL — ABNORMAL LOW (ref 4.0–10.5)
nRBC: 0 % (ref 0.0–0.2)

## 2020-05-04 LAB — TSH: TSH: 5.755 u[IU]/mL — ABNORMAL HIGH (ref 0.320–4.118)

## 2020-05-04 MED ORDER — CEPHALEXIN 500 MG PO CAPS
500.0000 mg | ORAL_CAPSULE | Freq: Four times a day (QID) | ORAL | 0 refills | Status: DC
Start: 2020-05-04 — End: 2020-05-05

## 2020-05-04 NOTE — Progress Notes (Signed)
Patient rested with [place] for 30 minutes following SRS treatment.  Patient denies fatigue, headache, dizziness, nausea, diplopia or ringing in the ears. Patient's only concern is area of phlebitis from previous IV site. Picture taken and shown to Alfredia Client who recommended patient take 500 mg Keflex PO QID, aspirin 325mg  daily for the next 2 weeks, and to have a venous doppler done of upper extremity to rule out DVT. Relayed recommendations to patient and wife who agreed to start antibiotic and increase in aspirin, but declined to have doppler performed. Explained importance of procedure to ensure patient had not developed a blood clot that could dislodge and cause further complications, but patient was adamant that he did not want to make another trip to hospital and just wanted to see if antibiotics resolved issue. Advised patient and wife to call clinic back on Thursday is symptoms worsen or do not appear to be improving. Patient and wife understand importance to avoid strenuous activity for the next 24 hours post SRS treatment and will call [phone] with needs.   Vitals:   05/04/20 1535 05/04/20 1610  BP: (!) 118/57 (!) 130/59  Pulse: 80 80  Resp: 18 16  Temp: 98.6 F (37 C)   SpO2: 99% 98%

## 2020-05-04 NOTE — Progress Notes (Signed)
  Radiation Oncology         [phone] ________________________________  Name: OLYVER HAWES MRN: [id number]  Date: 05/04/2020  DOB: [birth year]  Stereotactic Treatment Procedure Note  SPECIAL TREATMENT PROCEDURE  Outpatient    ICD-10-CM   1. Brain metastases ([place])  C79.31     3D TREATMENT PLANNING AND DOSIMETRY:  The patient's radiation plan was reviewed and approved by neurosurgery and radiation oncology prior to treatment.  It showed 3-dimensional radiation distributions overlaid onto the planning CT/MRI image set.  The [facility] for the target structures as well as the organs at risk were reviewed. The documentation of the 3D plan and dosimetry are filed in the radiation oncology EMR.  NARRATIVE:  SHAQUON GROPP was brought to the TrueBeam stereotactic radiation treatment machine and placed supine on the CT couch. The head frame was applied, and the patient was set up for stereotactic radiosurgery.  Neurosurgery was present for the set-up and delivery  SIMULATION VERIFICATION:  In the couch zero-angle position, the patient underwent Exactrac imaging using the Brainlab system with orthogonal KV images.  These were carefully aligned and repeated to confirm treatment position for each of the isocenters.  The Exactrac snap film verification was repeated at each couch angle.  SPECIAL TREATMENT PROCEDURE: Johney Maine Cerro received stereotactic radiosurgery to the following targets:   ExacTrac, 6 vmat beams, 6FFF photons. Max dose=129.5% (20 Gy/ 1 fraction) PTV1 Lt Cerebellum 95mm PTV2 Rt Cerebellum 28mm PTV3 Lt Cerebellum 26mm PTV4 Lt Cerebellum 2.32mm PTV5 Lt Cerebellum 1.68mm PTV6 Mid Cerebellum 79mm  This constitutes a special treatment procedure due to the ablative dose delivered and the technical nature of treatment.  This highly technical modality of treatment ensures that the ablative dose is centered on the patient's tumor while sparing normal tissues from excessive dose and risk of  detrimental effects.  STEREOTACTIC TREATMENT MANAGEMENT:  Following delivery, the patient was transported to nursing in stable condition and monitored for possible acute effects.  Vital signs were recorded BP (!) 130/59 (BP Location: Left Arm, Patient Position: Sitting)   Pulse 80   Temp 98.6 F (37 C) (Oral)   Resp 16   SpO2 98% . The patient tolerated treatment without significant acute effects, and was discharged to home in stable condition.    PLAN: Follow-up in one month.  ________________________________   Eppie Gibson, MD

## 2020-05-05 ENCOUNTER — Telehealth: Payer: Self-pay

## 2020-05-05 ENCOUNTER — Other Ambulatory Visit: Payer: Self-pay | Admitting: Medical

## 2020-05-05 ENCOUNTER — Encounter: Payer: Medicare PPO | Admitting: Nutrition

## 2020-05-05 MED ORDER — CEPHALEXIN 500 MG PO CAPS: 500.0000 mg | ORAL_CAPSULE | Freq: Four times a day (QID) | ORAL | 0 refills | Status: AC

## 2020-05-05 NOTE — Telephone Encounter (Signed)
VM message from Pt's wife inquiring about Pt taking medication for phlebitis. Pt.'s wife stated that, Pt's hand was still red and warm, She also stated that Pt is refusing to take the aspirin that he was told to take. Pt.'s wife concerned if he doesn't take aspirin could he get a blood clot. Prescription for keflex was sent to the pharmacy. Message was given to Sandi Mealy PA who spoke with Pt and wife and was informed prescription was not picked up and he informed Pt is was imperative that he take the aspirin to prevent a blood clot. Pt and wife verbalized understanding. No further problems or concerns noted

## 2020-05-11 ENCOUNTER — Inpatient Hospital Stay: Payer: Medicare PPO

## 2020-05-11 ENCOUNTER — Inpatient Hospital Stay: Payer: Medicare PPO | Admitting: Physician Assistant

## 2020-05-11 ENCOUNTER — Encounter: Payer: Self-pay | Admitting: Physician Assistant

## 2020-05-11 ENCOUNTER — Telehealth: Payer: Self-pay | Admitting: Radiation Therapy

## 2020-05-11 ENCOUNTER — Other Ambulatory Visit: Payer: Self-pay

## 2020-05-11 VITALS — BP 134/66 | HR 84 | Temp 97.0°F | Resp 18 | Ht 68.0 in | Wt 155.0 lb

## 2020-05-11 DIAGNOSIS — R630 Anorexia: Secondary | ICD-10-CM | POA: Diagnosis not present

## 2020-05-11 DIAGNOSIS — R634 Abnormal weight loss: Secondary | ICD-10-CM

## 2020-05-11 DIAGNOSIS — C3490 Malignant neoplasm of unspecified part of unspecified bronchus or lung: Secondary | ICD-10-CM | POA: Diagnosis not present

## 2020-05-11 DIAGNOSIS — C7931 Secondary malignant neoplasm of brain: Secondary | ICD-10-CM

## 2020-05-11 DIAGNOSIS — C3492 Malignant neoplasm of unspecified part of left bronchus or lung: Secondary | ICD-10-CM

## 2020-05-11 DIAGNOSIS — F329 Major depressive disorder, single episode, unspecified: Secondary | ICD-10-CM

## 2020-05-11 DIAGNOSIS — R05 Cough: Secondary | ICD-10-CM | POA: Diagnosis not present

## 2020-05-11 DIAGNOSIS — C7951 Secondary malignant neoplasm of bone: Secondary | ICD-10-CM | POA: Diagnosis not present

## 2020-05-11 DIAGNOSIS — F32A Depression, unspecified: Secondary | ICD-10-CM

## 2020-05-11 DIAGNOSIS — Z5112 Encounter for antineoplastic immunotherapy: Secondary | ICD-10-CM | POA: Diagnosis not present

## 2020-05-11 LAB — CBC WITH DIFFERENTIAL (CANCER CENTER ONLY)
Abs Immature Granulocytes: 0.01 10*3/uL (ref 0.00–0.07)
Basophils Absolute: 0 10*3/uL (ref 0.0–0.1)
Basophils Relative: 0 %
Eosinophils Absolute: 0.2 10*3/uL (ref 0.0–0.5)
Eosinophils Relative: 5 %
HCT: 34.9 % — ABNORMAL LOW (ref 39.0–52.0)
Hemoglobin: 11.5 g/dL — ABNORMAL LOW (ref 13.0–17.0)
Immature Granulocytes: 0 %
Lymphocytes Relative: 41 %
Lymphs Abs: 1.3 10*3/uL (ref 0.7–4.0)
MCH: 29 pg (ref 26.0–34.0)
MCHC: 33 g/dL (ref 30.0–36.0)
MCV: 88.1 fL (ref 80.0–100.0)
Monocytes Absolute: 0.5 10*3/uL (ref 0.1–1.0)
Monocytes Relative: 15 %
Neutro Abs: 1.3 10*3/uL — ABNORMAL LOW (ref 1.7–7.7)
Neutrophils Relative %: 39 %
Platelet Count: 123 10*3/uL — ABNORMAL LOW (ref 150–400)
RBC: 3.96 MIL/uL — ABNORMAL LOW (ref 4.22–5.81)
RDW: 16.2 % — ABNORMAL HIGH (ref 11.5–15.5)
WBC Count: 3.3 10*3/uL — ABNORMAL LOW (ref 4.0–10.5)
nRBC: 0 % (ref 0.0–0.2)

## 2020-05-11 LAB — CMP (CANCER CENTER ONLY)
ALT: 22 U/L (ref 0–44)
AST: 27 U/L (ref 15–41)
Albumin: 2.9 g/dL — ABNORMAL LOW (ref 3.5–5.0)
Alkaline Phosphatase: 185 U/L — ABNORMAL HIGH (ref 38–126)
Anion gap: 9 (ref 5–15)
BUN: 6 mg/dL — ABNORMAL LOW (ref 8–23)
CO2: 23 mmol/L (ref 22–32)
Calcium: 9.9 mg/dL (ref 8.9–10.3)
Chloride: 108 mmol/L (ref 98–111)
Creatinine: 1.14 mg/dL (ref 0.61–1.24)
GFR, Est AFR Am: >60 mL/min (ref >60)
GFR, Estimated: >60 mL/min (ref >60)
Glucose, Bld: 107 mg/dL — ABNORMAL HIGH (ref 70–99)
Potassium: 3.6 mmol/L (ref 3.5–5.1)
Sodium: 140 mmol/L (ref 135–145)
Total Bilirubin: 0.3 mg/dL (ref 0.3–1.2)
Total Protein: 7.3 g/dL (ref 6.5–8.1)

## 2020-05-11 MED ORDER — METHYLPREDNISOLONE 4 MG PO TBPK: ORAL_TABLET | ORAL | 0 refills | Status: DC

## 2020-05-11 MED ORDER — CITALOPRAM HYDROBROMIDE 20 MG PO TABS: 20.0000 mg | ORAL_TABLET | Freq: Every day | ORAL | 3 refills | Status: AC

## 2020-05-11 NOTE — Patient Instructions (Signed)
Summary:  -The sample (biopsy) that they took of your tumor was consistent with a subtype of Non-small cell lung cancer called Adenocarcinoma. This is the most common type of lung cancer.  -We covered a lot of important information at your appointment today regarding what the treatment plan is moving forward. Here are the the main points that were discussed at your office visit with [place] today:  -The treatment that you will receive consists of two chemotherapy drugs, called Carboplatin and Alimta (also called Pemetrexed) and one immunotherapy drug called Keytruda (pembrolizumab).  -Your treatment will be given once every 3 weeks. We will check your labs once a week for the first ~5 treatments just to make sure that important components of your blood are in an acceptable range -We will get a CT scan after 3 treatments to check on the progress of treatment. When I see you next week I will discuss ordering your next scan and instructions -Starting from the 5th treatment, it gets easier. You will only be on two of the three medications and no longer have to come into every week for blood work anymore.   Medications: -I have sent a medrol dosepak to the pharmacy to help your appetite and energy. Please take this in the morning.   Follow up:  -We will see you back for a follow up visit 1 week   -If you need to reach [place] at any time, the main office number to the cancer center is [phone], when you call, ask to speak to either Cassie's or Dr. Worthy Flank nurse.

## 2020-05-11 NOTE — Progress Notes (Signed)
Phillip Franklin OFFICE PROGRESS NOTE  Seward Carol, MD [place] [address] [place] [address]  DIAGNOSIS: Stage IV (T4, N3, M1c) non-small cell lung cancer, adenocarcinoma presented with left upper lobe lung mass in addition to bilateral mediastinal lymphadenopathy as well as bilateral pulmonary nodules and metastatic disease to the bones and brain diagnosed in July 2021.  Molecular studies by [facility] showed no actionable mutations.  PRIOR THERAPY: SRS to the metastatic brain lesion under the care of Dr. Isidore Moos on 05/04/20  CURRENT THERAPY: Palliative systemic chemotherapy with carboplatin for an AUC of 5, Alimta 500 mg per metered squared, Keytruda 200 mg IV every 3 weeks.  Status post 2 cycles. First dose 04/06/20  INTERVAL HISTORY: Phillip Franklin 79 y.o. male returns to the clinic today for a follow up visit accompanied by his wife.  The patient is currently undergoing palliative systemic chemotherapy and immunotherapy.  The patient has been tolerating his treatment fairly well without any concerning complaints except for nausea as well as fatigue.  The patient has been feeling unwell recently with a chief complaint of depression, low appetite, fatigue, and thrombophlebitis.  Regarding the depression, the patient was previously treated with Celexa.  His antidepressant was recently switched to 30 mg p.o. nightly of Remeron.  Since changing to Remeron, his mood/depression seems to have worsened. [name] no longer taking remeron.  The patient is not interested in a support group or seeing a counselor due to patient preference.   Regarding the low appetite, the patient's wife states that he only drinks 4-8 ounces of milk as well as chicken soup per day.  His weight is fairly stable compared to his last visit. He lost 1lb.  The patient was previously prescribed Marinol but was not interested in taking it.  He recently resumed taking Marinol.  The patient also tolerates  lemonade and Tang.  The patient has tried supplemental drinks in the past with Ensure and tried [employer] essentials.  The patient does not like these products due to them being too sweet.  [name] not interested in meeting with a dietitian at this time.  Regarding his thrombophlebitis, this is greatly improved at this time.  [name] almost completed his course of antibiotics with Keflex.  However, due to the patient's depression and diminished mood, he has not been taking his prescribed medications, including his antibiotics as planned.  Otherwise the patient denies any fever, chills, or night sweats.  His breathing is good without any significant shortness of breath, chest pain, or any hemoptysis.  He does sometimes spit up phlegm.  His nausea improved with cycle #2.  [name] here today for evaluation, repeat blood work, and to further assess his concerns.   MEDICAL HISTORY: Past Medical History:  Diagnosis Date  . Depression   . GERD (gastroesophageal reflux disease)    occasional, diet controlled  . High cholesterol   . Hypertension    no longer taking medication - states it's undercontrol  . Hypothyroid     ALLERGIES:  is allergic to codeine, hydrocodone, oxycodone, and penicillins.  MEDICATIONS:  Current Outpatient Medications  Medication Sig Dispense Refill  . acetaminophen (TYLENOL) 325 MG tablet Take 325-650 mg by mouth every 6 (six) hours as needed for moderate pain or headache.    Marland Kitchen atorvastatin (LIPITOR) 20 MG tablet Take 20 mg by mouth daily.    . benzonatate (TESSALON) 200 MG capsule Take 1 capsule (200 mg total) by mouth 4 (  four) times daily as needed for cough. 60 capsule 1  . cephALEXin (KEFLEX) 500 MG capsule Take 1 capsule (500 mg total) by mouth 4 (four) times daily for 7 days. 28 capsule 0  . Coenzyme Q10 (COQ-10) 100 MG CAPS Take 100 mg by mouth every evening.     Marland Kitchen esomeprazole (NEXIUM) 20 MG capsule Take 20 mg by mouth daily as needed (acid reflux).    .  folic acid (FOLVITE) 1 MG tablet Take 1 tablet (1 mg total) by mouth daily. 30 tablet 4  . levothyroxine (SYNTHROID) 88 MCG tablet Take 88 mcg by mouth daily before breakfast.    . LORazepam (ATIVAN) 0.5 MG tablet Take 1-2 tablets by mouth 30 minutes before procedures, PRN anxiety. 8 tablet 0  . methylPREDNISolone (MEDROL DOSEPAK) 4 MG TBPK tablet Use as instructed 21 tablet 0  . mirtazapine (REMERON) 30 MG tablet Take 1 tablet (30 mg total) by mouth at bedtime. 30 tablet 2  . prochlorperazine (COMPAZINE) 10 MG tablet Take 1 tablet (10 mg total) by mouth every 6 (six) hours as needed for nausea or vomiting. 30 tablet 0  . tamsulosin (FLOMAX) 0.4 MG CAPS capsule Take 0.4 mg by mouth every evening.      No current facility-administered medications for this visit.    SURGICAL HISTORY:  Past Surgical History:  Procedure Laterality Date  . BRONCHIAL NEEDLE ASPIRATION BIOPSY  03/24/2020   Procedure: BRONCHIAL NEEDLE ASPIRATION BIOPSIES;  Surgeon: Candee Furbish, MD;  Location: [facility] ENDOSCOPY;  Service: Pulmonary;;  . HEMORRHOID SURGERY    . VIDEO BRONCHOSCOPY WITH ENDOBRONCHIAL ULTRASOUND N/A 03/24/2020   Procedure: VIDEO BRONCHOSCOPY WITH ENDOBRONCHIAL ULTRASOUND;  Surgeon: Candee Furbish, MD;  Location: [facility] ENDOSCOPY;  Service: Pulmonary;  Laterality: N/A;    REVIEW OF SYSTEMS:   Review of Systems  Constitutional: Positive for fatigue and appetite change.  Negative for chills, fever and unexpected weight change.  HENT: Negative for mouth sores, nosebleeds, sore throat and trouble swallowing.   Eyes: Negative for eye problems and icterus.  Respiratory: Negative for cough, hemoptysis, shortness of breath and wheezing.   Cardiovascular: Negative for chest pain and leg swelling.  Gastrointestinal: Negative for abdominal pain, constipation, diarrhea, nausea (not at this time) and vomiting.  Genitourinary: Negative for bladder incontinence, difficulty urinating, dysuria, frequency and hematuria.    Musculoskeletal: Negative for back pain, gait problem, neck pain and neck stiffness.  Skin: Negative for itching and rash.  Neurological: Negative for dizziness, extremity weakness, gait problem, headaches, light-headedness and seizures.  Hematological: Negative for adenopathy. Does not bruise/bleed easily.  Psychiatric/Behavioral: Positive for depression.  Negative for confusion, and sleep disturbance. The patient is not nervous/anxious.     PHYSICAL EXAMINATION:  Blood pressure 134/66, pulse 84, temperature (!) 97 F (36.1 C), temperature source Tympanic, resp. rate 18, height '5\' 8"'  (1.727 m), weight 155 lb (70.3 kg), SpO2 100 %.  ECOG PERFORMANCE STATUS: 2 - Symptomatic, <50% confined to bed  Physical Exam  Constitutional: Oriented to person, place, and time and well-developed, well-nourished, and in no distress.  HENT:  Head: Normocephalic and atraumatic.  Mouth/Throat: Oropharynx is clear and moist. No oropharyngeal exudate.  Eyes: Conjunctivae are normal. Right eye exhibits no discharge. Left eye exhibits no discharge. No scleral icterus.  Neck: Normal range of motion. Neck supple.  Cardiovascular: Normal rate, regular rhythm, normal heart sounds and intact distal pulses.   Pulmonary/Chest: Effort normal and breath sounds normal. No respiratory distress. No wheezes. No rales.  Abdominal: Soft.  Bowel sounds are normal. Exhibits no distension and no mass. There is no tenderness.  Musculoskeletal: Normal range of motion. Exhibits no edema.  Lymphadenopathy:    No cervical adenopathy.  Neurological: Alert and oriented to person, place, and time. Exhibits normal muscle tone. Gait normal. Coordination normal.  Skin: Skin is warm and dry. No rash noted. Not diaphoretic. No erythema. No pallor.  Psychiatric: Depressed mood, memory and judgment normal.  Vitals reviewed.  LABORATORY DATA: Lab Results  Component Value Date   WBC 3.3 (L) 05/11/2020   HGB 11.5 (L) 05/11/2020   HCT 34.9  (L) 05/11/2020   MCV 88.1 05/11/2020   PLT 123 (L) 05/11/2020      Chemistry      Component Value Date/Time   NA 140 05/11/2020 1430   K 3.6 05/11/2020 1430   CL 108 05/11/2020 1430   CO2 23 05/11/2020 1430   BUN 6 (L) 05/11/2020 1430   CREATININE 1.14 05/11/2020 1430      Component Value Date/Time   CALCIUM 9.9 05/11/2020 1430   ALKPHOS 185 (H) 05/11/2020 1430   AST 27 05/11/2020 1430   ALT 22 05/11/2020 1430   BILITOT 0.3 05/11/2020 1430       RADIOGRAPHIC STUDIES:  MR BRAIN W WO CONTRAST  Addendum Date: 04/24/2020   ADDENDUM REPORT: 04/24/2020 16:11 ADDENDUM: The case was reviewed with Dr. Isidore Moos. There is an additional enhancing lesion in the left cerebellum which was not dictated in the original report. 3 mm lesion in the left medial cerebellum axial image 40 also is consistent with metastatic disease. This is marked with an arrow. In summary, there is 1 metastatic deposit in the right cerebellum. There are 4 definite lesions in the left cerebellum with a probable additional lesion in the left lateral cerebellum on image 39, for total of 6 metastatic deposits in the posterior fossa. Electronically Signed   By: Franchot Gallo M.D.   On: 04/24/2020 16:11   Result Date: 04/24/2020 CLINICAL DATA:  Lung cancer metastatic to brain. SRS treatment planning EXAM: MRI HEAD WITHOUT AND WITH CONTRAST TECHNIQUE: Multiplanar, multiecho pulse sequences of the brain and surrounding structures were obtained without and with intravenous contrast. CONTRAST:  72m MULTIHANCE GADOBENATE DIMEGLUMINE 529 MG/ML IV SOLN COMPARISON:  MRI head 03/27/2020 FINDINGS: Brain: Multiple enhancing lesions in the cerebellum bilaterally compatible with metastatic disease. 3 mm lesion right superior cerebellum axial image 45 2.5 mm enhancing lesion left inferolateral cerebellum axial image 23. This lesion is not seen on the prior study. 7 mm rim enhancing necrotic lesion left cerebellum axial image 36 is unchanged. 3 mm  enhancing lesion left anterior cerebellum axial image 36 is unchanged 1.5 mm enhancement left lateral cerebellum axial image 39 possible metastatic disease versus vessel. This is not seen on the prior study. Prior study indicated a small area of enhancement in the left posterior cerebellum. On today's study this has the appearance of a small vessel No supratentorial metastatic deposits. 6 x 19 mm meningioma right frontal convexity is unchanged. 4 mm dural base calcification left frontal convexity is unchanged. This shows increased signal on T1 and does not enhance. Possible meningioma or benign dural calcification. Mild atrophy without hydrocephalus or midline shift. Negative for acute infarct. No significant cerebellar edema. Negative for intracranial hemorrhage. Vascular: Normal arterial flow voids. Skull and upper cervical spine: No worrisome skeletal lesions. Sinuses/Orbits: Mild mucosal edema paranasal sinuses. Negative orbit Other: None IMPRESSION: Enhancing lesions and cerebellum compatible with metastatic disease. Largest lesion left cerebellum  7 mm unchanged. No significant edema. No supratentorial metastatic disease identified 6 x 19 mm meningioma right frontal convexity unchanged. 4 mm left frontal convexity dural calcification unchanged. Electronically Signed: By: Franchot Gallo M.D. On: 04/23/2020 12:46   CT Biopsy  Result Date: 04/21/2020 INDICATION: 79 year old male with a history of lung carcinoma, referred for additional tissue sample for molecular studies EXAM: CT BIOPSY MEDICATIONS: None. ANESTHESIA/SEDATION: Moderate (conscious) sedation was employed during this procedure. A total of Versed 1.0 mg and Fentanyl 25 mcg was administered intravenously. Moderate Sedation Time: 11 minutes. The patient's level of consciousness and vital signs were monitored continuously by radiology nursing throughout the procedure under my direct supervision. FLUOROSCOPY TIME:  CT COMPLICATIONS: None PROCEDURE: The  procedure, risks, benefits, and alternatives were explained to the patient and the patient's family. Specific risks that were addressed included bleeding, infection, pneumothorax, need for further procedure including chest tube placement, chance of delayed pneumothorax or hemorrhage, hemoptysis, nondiagnostic sample, cardiopulmonary collapse, death. Questions regarding the procedure were encouraged and answered. The patient understands and consents to the procedure. Patient was positioned in the left decubitus position on the CT gantry table and a scout CT of the chest was performed for planning purposes. Once angle of approach was determined, the skin and subcutaneous tissues this scan was prepped and draped in the usual sterile fashion, and a sterile drape was applied covering the operative field. A sterile gown and sterile gloves were used for the procedure. Local anesthesia was provided with 1% Lidocaine. The skin and subcutaneous tissues were infiltrated 1% lidocaine for local anesthesia, and a small stab incision was made with an 11 blade scalpel. Using CT guidance, a 17 gauge trocar needle was advanced into the left upper lobetarget. After confirmation of the tip, separate 18 gauge core biopsies were performed. These were placed into solution for transportation to the lab. Biosentry Device was deployed. A final CT image was performed. Patient tolerated the procedure well and remained hemodynamically stable throughout. No complications were encountered and no significant blood loss was encounter IMPRESSION: Status post CT-guided biopsy of left upper lobe FDG avid lesion. Tissue specimen sent to pathology for complete histopathologic analysis. Signed, Dulcy Fanny. Dellia Nims, RPVI [facility] Radiology Electronically Signed   By: Corrie Mckusick D.O.   On: 04/21/2020 13:46   DG Chest Port 1 View  Result Date: 04/21/2020 CLINICAL DATA:  79 year old male status post  left apical lung mass biopsy EXAM: PORTABLE CHEST 1 VIEW COMPARISON:  02/21/2020 FINDINGS: Cardiomediastinal silhouette unchanged in size and contour. Nodular opacities of the bilateral lungs similar to the comparison, compatible with findings on prior PET CT. Left apical mass, similar to comparison studies. No pneumothorax or pleural effusion. No new confluent airspace disease. IMPRESSION: Negative for pneumothorax status post left apical mass biopsy. Electronically Signed   By: Corrie Mckusick D.O.   On: 04/21/2020 13:47     ASSESSMENT/PLAN:  This is a very pleasant 79 year old male recently diagnosed with stage IV (T4, and 3, and 1C) non-small cell lung cancer, adenocarcinoma.  He presented with a left upper lobe lung mass in addition to bilateral pulmonary nodules as well as bilateral hilar and mediastinal lymphadenopathy.  He also has metastatic disease to the bones and brain.  He was diagnosed in July 2021.  His molecular studies performed by [facility] [employer] was negative for any actionable mutations. The initial tissue biopsy was insufficient for molecular studies by [facility]. The patient had repeat biopsy for the  molecular studies which were negative for any actionable mutations.   He completed SRS to the brain lesion under the care of Dr. Isidore Moos. This was completed on 05/04/20.  [name] currently undergoing palliative systemic chemotherapy with carboplatin for an AUC of 5, Alimta 500 mg per metered squared, Keytruda 200 mg IV every 3 weeks.  [name] status post 2 cycles.  He tolerated his treatment fairly well except for fatigue and nausea.  The patient was seen with Dr. Julien Nordmann today.  Dr. Julien Nordmann had a lengthy discussion with the patient about his current condition and his complaints related to depression, low appetite, fatigue, and weight loss.  Recommend that the patient discontinue Remeron and return to taking Celexa for his depression.  I sent a refill to the patient's pharmacy.  Due to the  patient's fatigue and decreased appetite, we will send a prescription in for a Medrol Dosepak to the patient's pharmacy.  The patient was instructed to take this in the morning due to it causing insomnia.  We will see the patient back for follow-up visit next week for evaluation before starting cycle #3.  At that visit, we will discuss ordering his next restaging CT scan.   The patient was advised to call immediately if he has any concerning symptoms in the interval. The patient voices understanding of current disease status and treatment options and is in agreement with the current care plan. All questions were answered. The patient knows to call the clinic with any problems, questions or concerns. We can certainly see the patient much sooner if necessary   No orders of the defined types were placed in this encounter.    Janiaya Ryser L Zamier Eggebrecht, PA-C 05/11/20   ADDENDUM: Hematology/Oncology Attending: I had a face-to-face encounter with the patient today.  I recommended his care plan.  This is a very pleasant 79 years old white male recently diagnosed with stage IV non-small cell lung cancer, adenocarcinoma with no actionable mutations and negative PD-L1 expression.  He also has several metastatic brain lesion and treated with SRS to the multiple brain lesions under the care of Dr. Isidore Moos. The patient is currently undergoing systemic chemotherapy with carboplatin, Alimta and Keytruda status post 2 cycles.  He has been tolerating the treatment well with no concerning adverse effect except for fatigue.  He denied having any nausea, vomiting, diarrhea or constipation.  He denied having any fever or chills.  He continues to deal with depression, lack of appetite and weight loss.  He was tried in the past on Remeron but no improvement.  He would like to go back to his previous treatment with Cymbalta. He was given prescription for Marinol to boost his appetite but the patient discontinued the  treatment on his own. [name] very depressed and very negative about his condition.  The recent molecular studies by [facility] showed no actionable mutations. I had a lengthy discussion with the patient and his family today about his current condition and we encouraged the patient to be positive and continue to fight this cancer. For the lack of appetite I will give him Medrol Dosepak and we will switch the patient back to Cymbalta. He will come back for follow-up visit in 1 week for evaluation before cycle #3 of his treatment. He will have repeat imaging studies after cycle #3 to evaluate his response to this regimen. The patient was advised to call immediately if he has any concerning symptoms in the interval.  Disclaimer: This note was dictated  with voice recognition software. Similar sounding words can inadvertently be transcribed and may be missed upon review. Eilleen Kempf, MD 05/11/20

## 2020-05-11 NOTE — Telephone Encounter (Signed)
The patient's wife,Phillip Franklin, called this morning to say that Phillip Franklin is having a very hard time right now. [name] not eating and not wanting to take his meds. He has become aggressive at times when refusing her care for him. She is his primary care giver, scared for him and exhausted. She would like to have someone see him today while he is here for labs at 12:30 if possible. I spoke with Cassie Heilingoetter PA-C about this and she is going to check to see if that is possible for them.   Mont Dutton R.T.(R)(T) Radiation Special Procedures Navigator

## 2020-05-15 NOTE — Progress Notes (Signed)
[place] OFFICE PROGRESS NOTE  Seward Carol, MD [place] [address] [place] [address]  DIAGNOSIS: Stage IV (T4, N3, M1c) non-small cell lung cancer, adenocarcinoma presented with left upper lobe lung mass in addition to bilateral mediastinal lymphadenopathy as well as bilateral pulmonary nodules and metastatic disease to the bones and brain diagnosed in July 2021.  Molecular studies by [employer] 360showed no actionable mutations.  PRIOR THERAPY: SRS to the metastatic brain lesion under the care of Dr. Isidore Moos on 05/04/20  CURRENT THERAPY: Palliative systemic chemotherapy with carboplatin for an AUC of 5, Alimta 500 mg per metered squared, Keytruda 200 mg IV every 3 weeks.  Status post 2 cycles. First dose 04/06/20  INTERVAL HISTORY: Phillip Franklin 79 y.o. male returns to the clinic today for a follow-up visit accompanied by his wife.  The patient continues to endorse fatigue, depression, and low appetite.  His last appointment he was given a prescription for Medrol Dosepak.  The patient Remeron was discontinued and he resumed his Celexa. He has resumed his Marinol and are requesting a refill today. He has lost about 3 lbs since his last appointment last week. Him and his wife are scheduled to talk to a member of the nutritionist team today to discuss strategies to help with his appetite.   Otherwise, the patient denies any new fevers, chills, or night sweats. He denies any chest pain, shortness of breath, cough, or hemoptysis.  He denies any diarrhea. He has mild constipation.  Denies vomiting. He denies any headache or visual changes.  He denies any rashes or skin changes.  [name] here today for evaluation before starting cycle #3.  MEDICAL HISTORY: Past Medical History:  Diagnosis Date  . Depression   . GERD (gastroesophageal reflux disease)    occasional, diet controlled  . High cholesterol   . Hypertension    no longer taking medication - states it's  undercontrol  . Hypothyroid     ALLERGIES:  is allergic to codeine, hydrocodone, oxycodone, and penicillins.  MEDICATIONS:  Current Outpatient Medications  Medication Sig Dispense Refill  . acetaminophen (TYLENOL) 325 MG tablet Take 325-650 mg by mouth every 6 (six) hours as needed for moderate pain or headache.    Marland Kitchen atorvastatin (LIPITOR) 20 MG tablet Take 20 mg by mouth daily.    . benzonatate (TESSALON) 200 MG capsule Take 1 capsule (200 mg total) by mouth 4 (four) times daily as needed for cough. 60 capsule 1  . citalopram (CELEXA) 20 MG tablet Take 1 tablet (20 mg total) by mouth daily. 30 tablet 3  . Coenzyme Q10 (COQ-10) 100 MG CAPS Take 100 mg by mouth every evening.     . dronabinol (MARINOL) 2.5 MG capsule Take 1 capsule (2.5 mg total) by mouth 2 (two) times daily before a meal. 60 capsule 2  . esomeprazole (NEXIUM) 20 MG capsule Take 20 mg by mouth daily as needed (acid reflux).    . folic acid (FOLVITE) 1 MG tablet Take 1 tablet (1 mg total) by mouth daily. 30 tablet 4  . levothyroxine (SYNTHROID) 88 MCG tablet Take 88 mcg by mouth daily before breakfast.    . LORazepam (ATIVAN) 0.5 MG tablet Take 1-2 tablets by mouth 30 minutes before procedures, PRN anxiety. 8 tablet 0  . methylPREDNISolone (MEDROL DOSEPAK) 4 MG TBPK tablet Use as instructed 21 tablet 0  . prochlorperazine (COMPAZINE) 10 MG tablet Take 1 tablet (10 mg total) by mouth every 6 (six) hours  as needed for nausea or vomiting. 30 tablet 0  . tamsulosin (FLOMAX) 0.4 MG CAPS capsule Take 0.4 mg by mouth every evening.      No current facility-administered medications for this visit.   Facility-Administered Medications Ordered in Other Visits  Medication Dose Route Frequency Provider Last Rate Last Admin  . CARBOplatin (PARAPLATIN) 370 mg in sodium chloride 0.9 % 250 mL chemo infusion  370 mg Intravenous Once Curt Bears, MD      . cyanocobalamin ((VITAMIN B-12)) injection 1,000 mcg  1,000 mcg Intramuscular Once  Curt Bears, MD      . dexamethasone (DECADRON) 10 mg in sodium chloride 0.9 % 50 mL IVPB  10 mg Intravenous Once Curt Bears, MD      . fosaprepitant (EMEND) 150 mg in sodium chloride 0.9 % 145 mL IVPB  150 mg Intravenous Once Curt Bears, MD      . palonosetron (ALOXI) injection 0.25 mg  0.25 mg Intravenous Once Curt Bears, MD      . pembrolizumab [facility]) 200 mg in sodium chloride 0.9 % 50 mL chemo infusion  200 mg Intravenous Once Curt Bears, MD      . PEMEtrexed (ALIMTA) 900 mg in sodium chloride 0.9 % 100 mL chemo infusion  500 mg/m2 (Treatment Plan Recorded) Intravenous Once Curt Bears, MD        SURGICAL HISTORY:  Past Surgical History:  Procedure Laterality Date  . BRONCHIAL NEEDLE ASPIRATION BIOPSY  03/24/2020   Procedure: BRONCHIAL NEEDLE ASPIRATION BIOPSIES;  Surgeon: Candee Furbish, MD;  Location: [facility] ENDOSCOPY;  Service: Pulmonary;;  . HEMORRHOID SURGERY    . VIDEO BRONCHOSCOPY WITH ENDOBRONCHIAL ULTRASOUND N/A 03/24/2020   Procedure: VIDEO BRONCHOSCOPY WITH ENDOBRONCHIAL ULTRASOUND;  Surgeon: Candee Furbish, MD;  Location[facility] ENDOSCOPY;  Service: Pulmonary;  Laterality: N/A;    REVIEW OF SYSTEMS:   Review of Systems  Constitutional: Positive for fatigue, weight loss, and appetite change.  Negative for chills and fever. HENT: Negative for mouth sores, nosebleeds, sore throat and trouble swallowing.   Eyes: Negative for eye problems and icterus.  Respiratory: Negative for cough, hemoptysis, shortness of breath and wheezing.   Cardiovascular: Negative for chest pain and leg swelling.  Gastrointestinal: Positive for mild constipation. Negative for abdominal pain, diarrhea, nausea and vomiting.  Genitourinary: Negative for bladder incontinence, difficulty urinating, dysuria, frequency and hematuria.   Musculoskeletal: Negative for back pain, gait problem, neck pain and neck stiffness.  Skin: Negative for itching and rash.  Neurological: Negative  for dizziness, extremity weakness, gait problem, headaches, light-headedness and seizures.  Hematological: Negative for adenopathy. Does not bruise/bleed easily.  Psychiatric/Behavioral: Positive for depression.  Negative for confusion, and sleep disturbance. The patient is not nervous/anxious.      PHYSICAL EXAMINATION:  Blood pressure 134/73, pulse 81, temperature 97.6 F (36.4 C), temperature source Tympanic, resp. rate 18, height 5\' 8"  (1.727 m), weight 151 lb 1.6 oz (68.5 kg), SpO2 95 %.  ECOG PERFORMANCE STATUS: 2 - Symptomatic, <50% confined to bed  Physical Exam  Constitutional: Oriented to person, place, and time and well-developed, well-nourished, and in no distress.  HENT:  Head: Normocephalic and atraumatic.  Mouth/Throat: Oropharynx is clear and moist. No oropharyngeal exudate.  Eyes: Conjunctivae are normal. Right eye exhibits no discharge. Left eye exhibits no discharge. No scleral icterus.  Neck: Normal range of motion. Neck supple.  Cardiovascular: Normal rate, regular rhythm, normal heart sounds and intact distal pulses.   Pulmonary/Chest: Effort normal and breath sounds normal. No respiratory  distress. No wheezes. No rales.  Abdominal: Soft. Bowel sounds are normal. Exhibits no distension and no mass. There is no tenderness.  Musculoskeletal: Normal range of motion. Exhibits no edema.  Lymphadenopathy:    No cervical adenopathy.  Neurological: Alert and oriented to person, place, and time. Exhibits normal muscle tone. Examined in the wheelchair.  Skin: Skin is warm and dry. No rash noted. Not diaphoretic. No erythema. No pallor.  Psychiatric: Depressed mood, memory and judgment normal.  Vitals reviewed.  LABORATORY DATA: Lab Results  Component Value Date   WBC 7.0 05/18/2020   HGB 12.8 (L) 05/18/2020   HCT 39.8 05/18/2020   MCV 90.5 05/18/2020   PLT 338 05/18/2020      Chemistry      Component Value Date/Time   NA 138 05/18/2020 1133   K 3.4 (L)  05/18/2020 1133   CL 103 05/18/2020 1133   CO2 25 05/18/2020 1133   BUN 11 05/18/2020 1133   CREATININE 1.23 05/18/2020 1133      Component Value Date/Time   CALCIUM 10.4 (H) 05/18/2020 1133   ALKPHOS 150 (H) 05/18/2020 1133   AST 30 05/18/2020 1133   ALT 26 05/18/2020 1133   BILITOT 0.5 05/18/2020 1133       RADIOGRAPHIC STUDIES:  MR BRAIN W WO CONTRAST  Addendum Date: 04/24/2020   ADDENDUM REPORT: 04/24/2020 16:11 ADDENDUM: The case was reviewed with Dr. Isidore Moos. There is an additional enhancing lesion in the left cerebellum which was not dictated in the original report. 3 mm lesion in the left medial cerebellum axial image 40 also is consistent with metastatic disease. This is marked with an arrow. In summary, there is 1 metastatic deposit in the right cerebellum. There are 4 definite lesions in the left cerebellum with a probable additional lesion in the left lateral cerebellum on image 39, for total of 6 metastatic deposits in the posterior fossa. Electronically Signed   By: Franchot Gallo M.D.   On: 04/24/2020 16:11   Result Date: 04/24/2020 CLINICAL DATA:  Lung cancer metastatic to brain. SRS treatment planning EXAM: MRI HEAD WITHOUT AND WITH CONTRAST TECHNIQUE: Multiplanar, multiecho pulse sequences of the brain and surrounding structures were obtained without and with intravenous contrast. CONTRAST:  46mL MULTIHANCE GADOBENATE DIMEGLUMINE 529 MG/ML IV SOLN COMPARISON:  MRI head 03/27/2020 FINDINGS: Brain: Multiple enhancing lesions in the cerebellum bilaterally compatible with metastatic disease. 3 mm lesion right superior cerebellum axial image 45 2.5 mm enhancing lesion left inferolateral cerebellum axial image 23. This lesion is not seen on the prior study. 7 mm rim enhancing necrotic lesion left cerebellum axial image 36 is unchanged. 3 mm enhancing lesion left anterior cerebellum axial image 36 is unchanged 1.5 mm enhancement left lateral cerebellum axial image 39 possible metastatic  disease versus vessel. This is not seen on the prior study. Prior study indicated a small area of enhancement in the left posterior cerebellum. On today's study this has the appearance of a small vessel No supratentorial metastatic deposits. 6 x 19 mm meningioma right frontal convexity is unchanged. 4 mm dural base calcification left frontal convexity is unchanged. This shows increased signal on T1 and does not enhance. Possible meningioma or benign dural calcification. Mild atrophy without hydrocephalus or midline shift. Negative for acute infarct. No significant cerebellar edema. Negative for intracranial hemorrhage. Vascular: Normal arterial flow voids. Skull and upper cervical spine: No worrisome skeletal lesions. Sinuses/Orbits: Mild mucosal edema paranasal sinuses. Negative orbit Other: None IMPRESSION: Enhancing lesions and cerebellum compatible with  metastatic disease. Largest lesion left cerebellum 7 mm unchanged. No significant edema. No supratentorial metastatic disease identified 6 x 19 mm meningioma right frontal convexity unchanged. 4 mm left frontal convexity dural calcification unchanged. Electronically Signed: By: Franchot Gallo M.D. On: 04/23/2020 12:46   CT Biopsy  Result Date: 04/21/2020 INDICATION: 79 year old male with a history of lung carcinoma, referred for additional tissue sample for molecular studies EXAM: CT BIOPSY MEDICATIONS: None. ANESTHESIA/SEDATION: Moderate (conscious) sedation was employed during this procedure. A total of Versed 1.0 mg and Fentanyl 25 mcg was administered intravenously. Moderate Sedation Time: 11 minutes. The patient's level of consciousness and vital signs were monitored continuously by radiology nursing throughout the procedure under my direct supervision. FLUOROSCOPY TIME:  CT COMPLICATIONS: None PROCEDURE: The procedure, risks, benefits, and alternatives were explained to the patient and the patient's family. Specific risks that were addressed included  bleeding, infection, pneumothorax, need for further procedure including chest tube placement, chance of delayed pneumothorax or hemorrhage, hemoptysis, nondiagnostic sample, cardiopulmonary collapse, death. Questions regarding the procedure were encouraged and answered. The patient understands and consents to the procedure. Patient was positioned in the left decubitus position on the CT gantry table and a scout CT of the chest was performed for planning purposes. Once angle of approach was determined, the skin and subcutaneous tissues this scan was prepped and draped in the usual sterile fashion, and a sterile drape was applied covering the operative field. A sterile gown and sterile gloves were used for the procedure. Local anesthesia was provided with 1% Lidocaine. The skin and subcutaneous tissues were infiltrated 1% lidocaine for local anesthesia, and a small stab incision was made with an 11 blade scalpel. Using CT guidance, a 17 gauge trocar needle was advanced into the left upper lobetarget. After confirmation of the tip, separate 18 gauge core biopsies were performed. These were placed into solution for transportation to the lab. Biosentry Device was deployed. A final CT image was performed. Patient tolerated the procedure well and remained hemodynamically stable throughout. No complications were encountered and no significant blood loss was encounter IMPRESSION: Status post CT-guided biopsy of left upper lobe FDG avid lesion. Tissue specimen sent to pathology for complete histopathologic analysis. Signed, Dulcy Fanny. Dellia Nims, RPVI [facility] Electronically Signed   By: Corrie Mckusick D.O.   On: 04/21/2020 13:46   DG Chest Port 1 View  Result Date: 04/21/2020 CLINICAL DATA:  79 year old male status post left apical lung mass biopsy EXAM: PORTABLE CHEST 1 VIEW COMPARISON:  02/21/2020 FINDINGS: Cardiomediastinal silhouette unchanged in size and  contour. Nodular opacities of the bilateral lungs similar to the comparison, compatible with findings on prior PET CT. Left apical mass, similar to comparison studies. No pneumothorax or pleural effusion. No new confluent airspace disease. IMPRESSION: Negative for pneumothorax status post left apical mass biopsy. Electronically Signed   By: Corrie Mckusick D.O.   On: 04/21/2020 13:47     ASSESSMENT/PLAN:  This is a very pleasant 79 year old male recently diagnosed with stage IV (T4, N3, M1C) non-small cell lung cancer, adenocarcinoma.  He presented with a left upper lobe lung mass in addition to bilateral pulmonary nodules as well as bilateral hilar and mediastinal lymphadenopathy.  He also has metastatic disease to the bones and brain.  He was diagnosed in July 2021.  His molecular studies performed by [employer] were negative for any actionable mutations. The initial tissue biopsy was insufficient for molecular studies by [facility]. The patient had  repeat biopsy for the molecular studies which were negative for any actionable mutations.   He completed SRS to the brain lesion under the care of Dr. Isidore Moos. This was completed on 05/04/20.  [name] currently undergoing palliative systemic chemotherapy with carboplatin for an AUC of 5, Alimta 500 mg per metered squared, Keytruda 200 mg IV every 3 weeks.  [name] status post 2 cycles.  He tolerated his treatment fairly well except for fatigue and nausea.   Labs were reviewed.  Recommend that he proceed with cycle #3 today scheduled.   I will arrange for restaging CT scan of the chest, abdomen, and pelvis prior to starting his next cycle of treatment.  We will see him back for follow-up visit in 3 weeks for evaluation and to review his scan results before starting cycle #4.  The patient will continue to take Celexa for his depression.  I have sent a refill of marinol to his pharmacy. His wife will meet with a member of the nutritionist team today via a  phone call to discuss strategies to help him with his decreased appetite with his taste changes.   The patient was advised to call immediately if he has any concerning symptoms in the interval. The patient voices understanding of current disease status and treatment options and is in agreement with the current care plan. All questions were answered. The patient knows to call the clinic with any problems, questions or concerns. We can certainly see the patient much sooner if necessary       Orders Placed This Encounter  Procedures  . CT CHEST ABDOMEN PELVIS W CONTRAST    Standing Status:   Future    Standing Expiration Date:   05/18/2021    Order Specific Question:   Reason for Exam (SYMPTOM  OR DIAGNOSIS REQUIRED)    Answer:   Restaging Lung Cancer    Order Specific Question:   Preferred imaging location?    Answer:   [facility]    Order Specific Question:   Radiology Contrast Protocol - do NOT remove file path    Answer:   [email or web address]     [place], PA-C 05/18/20

## 2020-05-16 ENCOUNTER — Other Ambulatory Visit: Payer: Self-pay | Admitting: Internal Medicine

## 2020-05-17 ENCOUNTER — Other Ambulatory Visit: Payer: Self-pay | Admitting: Internal Medicine

## 2020-05-18 ENCOUNTER — Other Ambulatory Visit: Payer: Self-pay

## 2020-05-18 ENCOUNTER — Inpatient Hospital Stay: Payer: Medicare PPO

## 2020-05-18 ENCOUNTER — Inpatient Hospital Stay: Payer: Medicare PPO | Admitting: Physician Assistant

## 2020-05-18 ENCOUNTER — Inpatient Hospital Stay: Payer: Medicare PPO | Admitting: Nutrition

## 2020-05-18 VITALS — BP 134/73 | HR 81 | Temp 97.6°F | Resp 18 | Ht 68.0 in | Wt 151.1 lb

## 2020-05-18 DIAGNOSIS — C7931 Secondary malignant neoplasm of brain: Secondary | ICD-10-CM | POA: Diagnosis not present

## 2020-05-18 DIAGNOSIS — Z5111 Encounter for antineoplastic chemotherapy: Secondary | ICD-10-CM

## 2020-05-18 DIAGNOSIS — C3492 Malignant neoplasm of unspecified part of left bronchus or lung: Secondary | ICD-10-CM | POA: Diagnosis not present

## 2020-05-18 DIAGNOSIS — C7951 Secondary malignant neoplasm of bone: Secondary | ICD-10-CM | POA: Diagnosis not present

## 2020-05-18 DIAGNOSIS — R630 Anorexia: Secondary | ICD-10-CM | POA: Diagnosis not present

## 2020-05-18 DIAGNOSIS — R634 Abnormal weight loss: Secondary | ICD-10-CM | POA: Diagnosis not present

## 2020-05-18 DIAGNOSIS — R05 Cough: Secondary | ICD-10-CM | POA: Diagnosis not present

## 2020-05-18 DIAGNOSIS — C3490 Malignant neoplasm of unspecified part of unspecified bronchus or lung: Secondary | ICD-10-CM | POA: Diagnosis not present

## 2020-05-18 DIAGNOSIS — Z5112 Encounter for antineoplastic immunotherapy: Secondary | ICD-10-CM | POA: Diagnosis not present

## 2020-05-18 LAB — CBC WITH DIFFERENTIAL (CANCER CENTER ONLY)
Abs Immature Granulocytes: 0.24 10*3/uL — ABNORMAL HIGH (ref 0.00–0.07)
Basophils Absolute: 0.1 10*3/uL (ref 0.0–0.1)
Basophils Relative: 1 %
Eosinophils Absolute: 0.1 10*3/uL (ref 0.0–0.5)
Eosinophils Relative: 2 %
HCT: 39.8 % (ref 39.0–52.0)
Hemoglobin: 12.8 g/dL — ABNORMAL LOW (ref 13.0–17.0)
Immature Granulocytes: 3 %
Lymphocytes Relative: 31 %
Lymphs Abs: 2.2 10*3/uL (ref 0.7–4.0)
MCH: 29.1 pg (ref 26.0–34.0)
MCHC: 32.2 g/dL (ref 30.0–36.0)
MCV: 90.5 fL (ref 80.0–100.0)
Monocytes Absolute: 1 10*3/uL (ref 0.1–1.0)
Monocytes Relative: 15 %
Neutro Abs: 3.4 10*3/uL (ref 1.7–7.7)
Neutrophils Relative %: 48 %
Platelet Count: 338 10*3/uL (ref 150–400)
RBC: 4.4 MIL/uL (ref 4.22–5.81)
RDW: 18.1 % — ABNORMAL HIGH (ref 11.5–15.5)
WBC Count: 7 10*3/uL (ref 4.0–10.5)
nRBC: 0.3 % — ABNORMAL HIGH (ref 0.0–0.2)

## 2020-05-18 LAB — CMP (CANCER CENTER ONLY)
ALT: 26 U/L (ref 0–44)
AST: 30 U/L (ref 15–41)
Albumin: 3.1 g/dL — ABNORMAL LOW (ref 3.5–5.0)
Alkaline Phosphatase: 150 U/L — ABNORMAL HIGH (ref 38–126)
Anion gap: 10 (ref 5–15)
BUN: 11 mg/dL (ref 8–23)
CO2: 25 mmol/L (ref 22–32)
Calcium: 10.4 mg/dL — ABNORMAL HIGH (ref 8.9–10.3)
Chloride: 103 mmol/L (ref 98–111)
Creatinine: 1.23 mg/dL (ref 0.61–1.24)
GFR, Est AFR Am: >60 mL/min (ref >60)
GFR, Estimated: 55 mL/min — ABNORMAL LOW (ref >60)
Glucose, Bld: 137 mg/dL — ABNORMAL HIGH (ref 70–99)
Potassium: 3.4 mmol/L — ABNORMAL LOW (ref 3.5–5.1)
Sodium: 138 mmol/L (ref 135–145)
Total Bilirubin: 0.5 mg/dL (ref 0.3–1.2)
Total Protein: 7.3 g/dL (ref 6.5–8.1)

## 2020-05-18 LAB — TSH: TSH: 4.118 u[IU]/mL (ref 0.320–4.118)

## 2020-05-18 MED ORDER — DRONABINOL 2.5 MG PO CAPS: 2.5000 mg | ORAL_CAPSULE | Freq: Two times a day (BID) | ORAL | 2 refills | Status: DC

## 2020-05-18 MED ORDER — SODIUM CHLORIDE 0.9 % IV SOLN
10.0000 mg | Freq: Once | INTRAVENOUS | Status: AC
  Administered 2020-05-18: 10 mg via INTRAVENOUS
  Filled 2020-05-18: qty 10

## 2020-05-18 MED ORDER — PALONOSETRON HCL INJECTION 0.25 MG/5ML
INTRAVENOUS | Status: AC
  Filled 2020-05-18: qty 5

## 2020-05-18 MED ORDER — SODIUM CHLORIDE 0.9 % IV SOLN
200.0000 mg | Freq: Once | INTRAVENOUS | Status: AC
  Administered 2020-05-18: 200 mg via INTRAVENOUS
  Filled 2020-05-18: qty 8

## 2020-05-18 MED ORDER — PALONOSETRON HCL INJECTION 0.25 MG/5ML
0.2500 mg | Freq: Once | INTRAVENOUS | Status: AC
  Administered 2020-05-18: 0.25 mg via INTRAVENOUS

## 2020-05-18 MED ORDER — SODIUM CHLORIDE 0.9 % IV SOLN
150.0000 mg | Freq: Once | INTRAVENOUS | Status: AC
  Administered 2020-05-18: 150 mg via INTRAVENOUS
  Filled 2020-05-18: qty 150

## 2020-05-18 MED ORDER — SODIUM CHLORIDE 0.9 % IV SOLN
500.0000 mg/m2 | Freq: Once | INTRAVENOUS | Status: AC
  Administered 2020-05-18: 900 mg via INTRAVENOUS
  Filled 2020-05-18: qty 20

## 2020-05-18 MED ORDER — SODIUM CHLORIDE 0.9 % IV SOLN
370.0000 mg | Freq: Once | INTRAVENOUS | Status: AC
  Administered 2020-05-18: 370 mg via INTRAVENOUS
  Filled 2020-05-18: qty 37

## 2020-05-18 MED ORDER — CYANOCOBALAMIN 1000 MCG/ML IJ SOLN
INTRAMUSCULAR | Status: AC
  Filled 2020-05-18: qty 1

## 2020-05-18 MED ORDER — SODIUM CHLORIDE 0.9 % IV SOLN
Freq: Once | INTRAVENOUS | Status: AC
  Filled 2020-05-18: qty 250

## 2020-05-18 MED ORDER — CYANOCOBALAMIN 1000 MCG/ML IJ SOLN
1000.0000 ug | Freq: Once | INTRAMUSCULAR | Status: AC
  Administered 2020-05-18: 1000 ug via INTRAMUSCULAR

## 2020-05-18 NOTE — Patient Instructions (Signed)
[facility] Discharge Instructions for Patients Receiving Chemotherapy  Today you received the following chemotherapy agents: pembrolizumab, pemetrexed, and carboplatin.  To help prevent nausea and vomiting after your treatment, we encourage you to take your nausea medication as directed.   If you develop nausea and vomiting that is not controlled by your nausea medication, call the clinic.   BELOW ARE SYMPTOMS THAT SHOULD BE REPORTED IMMEDIATELY:  *FEVER GREATER THAN 100.5 F  *CHILLS WITH OR WITHOUT FEVER  NAUSEA AND VOMITING THAT IS NOT CONTROLLED WITH YOUR NAUSEA MEDICATION  *UNUSUAL SHORTNESS OF BREATH  *UNUSUAL BRUISING OR BLEEDING  TENDERNESS IN MOUTH AND THROAT WITH OR WITHOUT PRESENCE OF ULCERS  *URINARY PROBLEMS  *BOWEL PROBLEMS  UNUSUAL RASH Items with * indicate a potential emergency and should be followed up as soon as possible.  Feel free to call the clinic should you have any questions or concerns. The clinic phone number is [phone].  Please show the CHEMO ALERT CARD at check-in to the Emergency Department and triage nurse.   

## 2020-05-18 NOTE — Patient Instructions (Signed)
-  It was good seeing you today.  -You will receive your third chemotherapy and immunotherapy treatment today. They will also give you a B12 shot.  -Before your next treatment, we would like you to have a CT scan of your chest, abdomen, and pelvis so we can see the progress of the treatment. The radiology department at the hospital should be calling to schedule that appointment. If you do not hear from them and need to call them, their number is [phone]. I would like your scan to be scheduled around 9/15 or 9/16 so that way when we see you on 9/20 we have the results to review with you.  -The nutritionist will talk to your wife today about strategies for eating. We want you to gain weight to be strong.  -I will send a refill of Marinol to your pharmacy.  -

## 2020-05-18 NOTE — Progress Notes (Signed)
79 year old male diagnosed with lung cancer with brain and bone metastases.  [name] a patient of Dr. Julien Nordmann.  [name] receiving chemotherapy and immunotherapy.  Past medical history includes depression, GERD, hypercholesterolemia, and hypertension.  Medications include Celexa, Nexium, Synthroid, Ativan, and Compazine.  Labs include glucose 137, albumin 3.1, potassium 3.4 on August 30.  Height: 68 inches. Weight: 151.1 pounds. Usual body weight: 160 pounds. BMI: 22.97.  Patient did not want to speak to me but his wife wanted nutrition consult. Patient has poor appetite and fatigue. [name] positive for depression and occasional nausea. Per wife, patient is very stubborn and refuses to eat most foods. He drinks lemonade and Tang and will occasionally drink milk. She reports she did get him to eat a little bit of chicken soup. [name] refusing Ensure and Carnation breakfast essentials because he says they are too sweet. She describes taste alterations as food has no taste or is too sweet.  Nutrition diagnosis: Unintended weight loss related to poor appetite and inadequate oral intake as evidenced by approximate 9 pound weight loss.  Intervention: Provided education on Improving taste alterations. Recommended strategies for improving taste of Ensure and other oral nutrition supplements.  Provided nutrition samples. Provided support and empathetic listening. Provided fact sheets and contact information.  Questions answered.  Teach back method used.  Monitoring, evaluation, goals: Patient will tolerate rate increased oral intake to minimize further weight loss.  Next visit: Wife will contact me with any further questions.  **Disclaimer: This note was dictated with voice recognition software. Similar sounding words can inadvertently be transcribed and this note may contain transcription errors which may not have been corrected upon publication of note.**

## 2020-05-19 ENCOUNTER — Telehealth: Payer: Self-pay | Admitting: Physician Assistant

## 2020-05-19 NOTE — Telephone Encounter (Signed)
Scheduled per los. Called and spoke with patients wife. Confirmed appt

## 2020-05-22 ENCOUNTER — Telehealth: Payer: Self-pay | Admitting: Emergency Medicine

## 2020-05-22 NOTE — Telephone Encounter (Signed)
Returning pt's wife call [name]) requesting more information about [employer] with [employer] to confirm business is legitimate as she was unfamiliar with their company.  Also confirmed eye drops are being picked up today and that warm compresses/sterile saline for eyes can be used as needed for swelling/drainage.  [name] denies any further questions/concerns at this time.

## 2020-05-26 ENCOUNTER — Inpatient Hospital Stay: Payer: Medicare PPO | Attending: Internal Medicine

## 2020-05-26 ENCOUNTER — Other Ambulatory Visit: Payer: Self-pay

## 2020-05-26 DIAGNOSIS — F329 Major depressive disorder, single episode, unspecified: Secondary | ICD-10-CM | POA: Insufficient documentation

## 2020-05-26 DIAGNOSIS — Z5112 Encounter for antineoplastic immunotherapy: Secondary | ICD-10-CM | POA: Diagnosis not present

## 2020-05-26 DIAGNOSIS — C3492 Malignant neoplasm of unspecified part of left bronchus or lung: Secondary | ICD-10-CM

## 2020-05-26 DIAGNOSIS — C3490 Malignant neoplasm of unspecified part of unspecified bronchus or lung: Secondary | ICD-10-CM | POA: Insufficient documentation

## 2020-05-26 DIAGNOSIS — C7931 Secondary malignant neoplasm of brain: Secondary | ICD-10-CM | POA: Insufficient documentation

## 2020-05-26 DIAGNOSIS — Z5189 Encounter for other specified aftercare: Secondary | ICD-10-CM | POA: Diagnosis not present

## 2020-05-26 DIAGNOSIS — C7951 Secondary malignant neoplasm of bone: Secondary | ICD-10-CM | POA: Diagnosis not present

## 2020-05-26 DIAGNOSIS — Z79899 Other long term (current) drug therapy: Secondary | ICD-10-CM | POA: Diagnosis not present

## 2020-05-26 LAB — CMP (CANCER CENTER ONLY)
ALT: 26 U/L (ref 0–44)
AST: 27 U/L (ref 15–41)
Albumin: 3.2 g/dL — ABNORMAL LOW (ref 3.5–5.0)
Alkaline Phosphatase: 132 U/L — ABNORMAL HIGH (ref 38–126)
Anion gap: 7 (ref 5–15)
BUN: 14 mg/dL (ref 8–23)
CO2: 24 mmol/L (ref 22–32)
Calcium: 9.7 mg/dL (ref 8.9–10.3)
Chloride: 104 mmol/L (ref 98–111)
Creatinine: 0.95 mg/dL (ref 0.61–1.24)
GFR, Est AFR Am: >60 mL/min (ref >60)
GFR, Estimated: >60 mL/min (ref >60)
Glucose, Bld: 124 mg/dL — ABNORMAL HIGH (ref 70–99)
Potassium: 3.9 mmol/L (ref 3.5–5.1)
Sodium: 135 mmol/L (ref 135–145)
Total Bilirubin: 0.6 mg/dL (ref 0.3–1.2)
Total Protein: 7.4 g/dL (ref 6.5–8.1)

## 2020-05-26 LAB — CBC WITH DIFFERENTIAL (CANCER CENTER ONLY)
Abs Immature Granulocytes: 0.02 10*3/uL (ref 0.00–0.07)
Basophils Absolute: 0 10*3/uL (ref 0.0–0.1)
Basophils Relative: 1 %
Eosinophils Absolute: 0 10*3/uL (ref 0.0–0.5)
Eosinophils Relative: 1 %
HCT: 35.7 % — ABNORMAL LOW (ref 39.0–52.0)
Hemoglobin: 11.8 g/dL — ABNORMAL LOW (ref 13.0–17.0)
Immature Granulocytes: 1 %
Lymphocytes Relative: 36 %
Lymphs Abs: 1.1 10*3/uL (ref 0.7–4.0)
MCH: 29.4 pg (ref 26.0–34.0)
MCHC: 33.1 g/dL (ref 30.0–36.0)
MCV: 88.8 fL (ref 80.0–100.0)
Monocytes Absolute: 0.3 10*3/uL (ref 0.1–1.0)
Monocytes Relative: 11 %
Neutro Abs: 1.6 10*3/uL — ABNORMAL LOW (ref 1.7–7.7)
Neutrophils Relative %: 50 %
Platelet Count: 98 10*3/uL — ABNORMAL LOW (ref 150–400)
RBC: 4.02 MIL/uL — ABNORMAL LOW (ref 4.22–5.81)
RDW: 17.1 % — ABNORMAL HIGH (ref 11.5–15.5)
WBC Count: 3.1 10*3/uL — ABNORMAL LOW (ref 4.0–10.5)
nRBC: 0 % (ref 0.0–0.2)

## 2020-06-01 ENCOUNTER — Other Ambulatory Visit: Payer: Self-pay

## 2020-06-01 ENCOUNTER — Inpatient Hospital Stay: Payer: Medicare PPO

## 2020-06-01 DIAGNOSIS — C7931 Secondary malignant neoplasm of brain: Secondary | ICD-10-CM | POA: Diagnosis not present

## 2020-06-01 DIAGNOSIS — Z5189 Encounter for other specified aftercare: Secondary | ICD-10-CM | POA: Diagnosis not present

## 2020-06-01 DIAGNOSIS — Z79899 Other long term (current) drug therapy: Secondary | ICD-10-CM | POA: Diagnosis not present

## 2020-06-01 DIAGNOSIS — Z5112 Encounter for antineoplastic immunotherapy: Secondary | ICD-10-CM | POA: Diagnosis not present

## 2020-06-01 DIAGNOSIS — C3490 Malignant neoplasm of unspecified part of unspecified bronchus or lung: Secondary | ICD-10-CM | POA: Diagnosis not present

## 2020-06-01 DIAGNOSIS — C3492 Malignant neoplasm of unspecified part of left bronchus or lung: Secondary | ICD-10-CM

## 2020-06-01 DIAGNOSIS — C7951 Secondary malignant neoplasm of bone: Secondary | ICD-10-CM | POA: Diagnosis not present

## 2020-06-01 DIAGNOSIS — F329 Major depressive disorder, single episode, unspecified: Secondary | ICD-10-CM | POA: Diagnosis not present

## 2020-06-01 LAB — CMP (CANCER CENTER ONLY)
ALT: 38 U/L (ref 0–44)
AST: 33 U/L (ref 15–41)
Albumin: 3.4 g/dL — ABNORMAL LOW (ref 3.5–5.0)
Alkaline Phosphatase: 159 U/L — ABNORMAL HIGH (ref 38–126)
Anion gap: 9 (ref 5–15)
BUN: 7 mg/dL — ABNORMAL LOW (ref 8–23)
CO2: 25 mmol/L (ref 22–32)
Calcium: 9.8 mg/dL (ref 8.9–10.3)
Chloride: 105 mmol/L (ref 98–111)
Creatinine: 1.08 mg/dL (ref 0.61–1.24)
GFR, Est AFR Am: >60 mL/min (ref >60)
GFR, Estimated: >60 mL/min (ref >60)
Glucose, Bld: 95 mg/dL (ref 70–99)
Potassium: 4.1 mmol/L (ref 3.5–5.1)
Sodium: 139 mmol/L (ref 135–145)
Total Bilirubin: 0.6 mg/dL (ref 0.3–1.2)
Total Protein: 7.5 g/dL (ref 6.5–8.1)

## 2020-06-01 LAB — CBC WITH DIFFERENTIAL (CANCER CENTER ONLY)
Abs Immature Granulocytes: 0.01 10*3/uL (ref 0.00–0.07)
Basophils Absolute: 0 10*3/uL (ref 0.0–0.1)
Basophils Relative: 0 %
Eosinophils Absolute: 0 10*3/uL (ref 0.0–0.5)
Eosinophils Relative: 1 %
HCT: 36.3 % — ABNORMAL LOW (ref 39.0–52.0)
Hemoglobin: 11.7 g/dL — ABNORMAL LOW (ref 13.0–17.0)
Immature Granulocytes: 0 %
Lymphocytes Relative: 28 %
Lymphs Abs: 1.1 10*3/uL (ref 0.7–4.0)
MCH: 28.8 pg (ref 26.0–34.0)
MCHC: 32.2 g/dL (ref 30.0–36.0)
MCV: 89.4 fL (ref 80.0–100.0)
Monocytes Absolute: 0.5 10*3/uL (ref 0.1–1.0)
Monocytes Relative: 13 %
Neutro Abs: 2.3 10*3/uL (ref 1.7–7.7)
Neutrophils Relative %: 58 %
Platelet Count: 63 10*3/uL — ABNORMAL LOW (ref 150–400)
RBC: 4.06 MIL/uL — ABNORMAL LOW (ref 4.22–5.81)
RDW: 18 % — ABNORMAL HIGH (ref 11.5–15.5)
WBC Count: 4 10*3/uL (ref 4.0–10.5)
nRBC: 0 % (ref 0.0–0.2)

## 2020-06-01 LAB — TSH: TSH: 2.727 u[IU]/mL (ref 0.320–4.118)

## 2020-06-04 ENCOUNTER — Other Ambulatory Visit: Payer: Self-pay

## 2020-06-04 ENCOUNTER — Ambulatory Visit (HOSPITAL_COMMUNITY)
Admission: RE | Admit: 2020-06-04 | Discharge: 2020-06-04 | Disposition: A | Discharge status: Home / Self Care | Payer: Medicare PPO | Source: Ambulatory Visit | Attending: Physician Assistant | Admitting: Physician Assistant

## 2020-06-04 DIAGNOSIS — C3490 Malignant neoplasm of unspecified part of unspecified bronchus or lung: Secondary | ICD-10-CM | POA: Diagnosis not present

## 2020-06-04 DIAGNOSIS — C7951 Secondary malignant neoplasm of bone: Secondary | ICD-10-CM | POA: Diagnosis not present

## 2020-06-04 DIAGNOSIS — N400 Enlarged prostate without lower urinary tract symptoms: Secondary | ICD-10-CM | POA: Diagnosis not present

## 2020-06-04 DIAGNOSIS — I2510 Atherosclerotic heart disease of native coronary artery without angina pectoris: Secondary | ICD-10-CM | POA: Diagnosis not present

## 2020-06-04 DIAGNOSIS — C3492 Malignant neoplasm of unspecified part of left bronchus or lung: Secondary | ICD-10-CM | POA: Insufficient documentation

## 2020-06-04 DIAGNOSIS — I700 Atherosclerosis of aorta: Secondary | ICD-10-CM | POA: Diagnosis not present

## 2020-06-04 IMAGING — CT CT CHEST-ABD-PELV W/ CM
2 of 5 series · 12 of 36 positions shown, 14 images · IV contrast (APPLIED)
Comparison: PET-CT, [DATE], CT chest, [DATE]

CLINICAL DATA: Lung cancer restaging

EXAM:
CT CHEST, ABDOMEN, AND PELVIS WITH CONTRAST
TECHNIQUE: Multidetector CT imaging of the chest, abdomen and pelvis was
performed following the standard protocol during bolus
administration of intravenous contrast.
CONTRAST:  100mL OMNIPAQUE IOHEXOL 300 MG/ML SOLN, additional oral
enteric contrast

[Series 2: cap with · axial · 0.70mm/px · z∈[-422,+58]mm · 9 of 122 slices shown, 11 images]
[im 13/122  mediastinal]
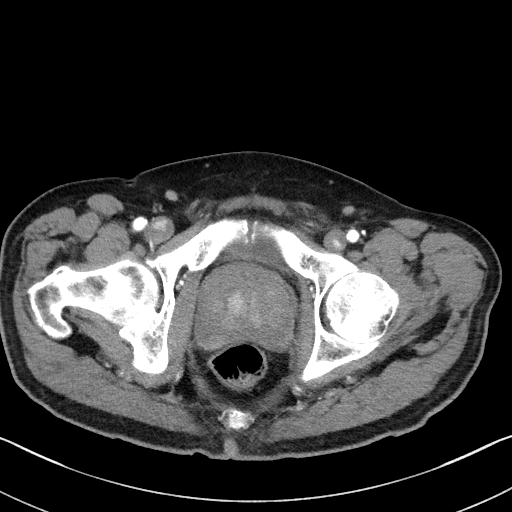
[im 13/122  bone]
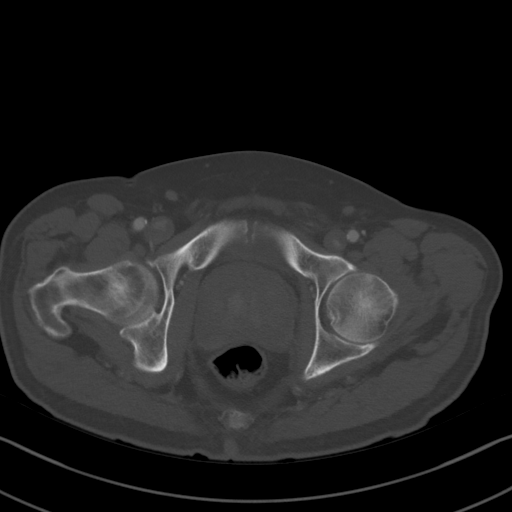
[im 25/122  mediastinal]
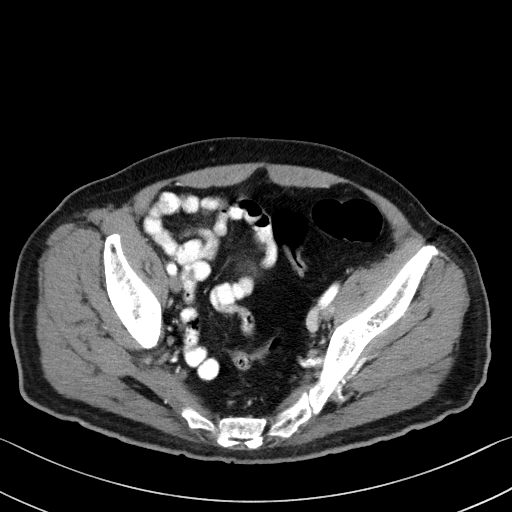
[im 37/122  mediastinal]
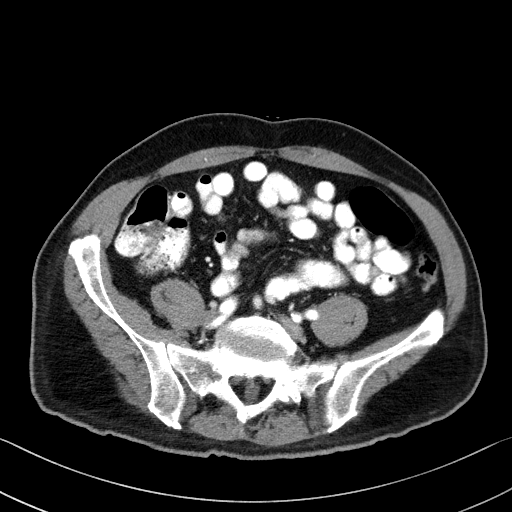
[im 49/122  mediastinal]
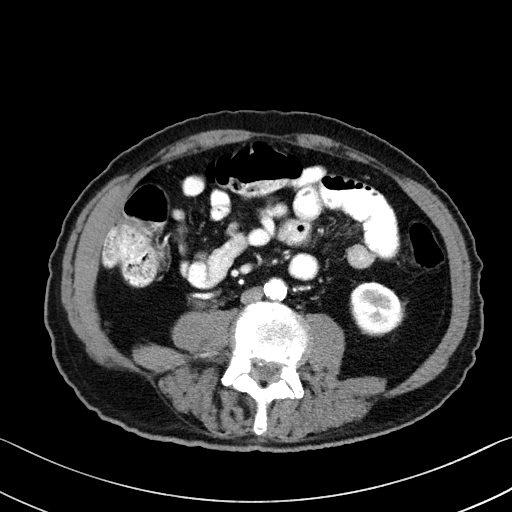
[im 61/122  mediastinal]
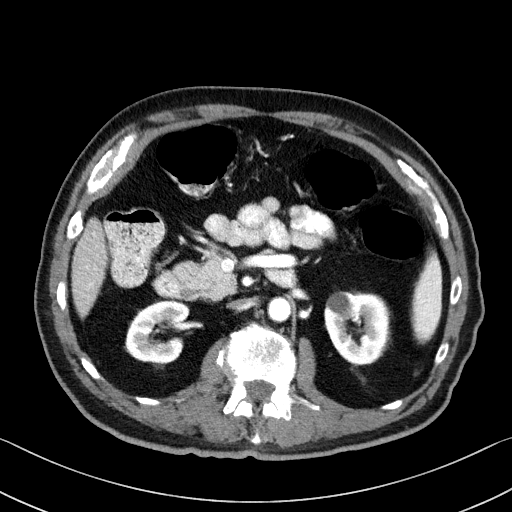
[im 73/122  mediastinal]
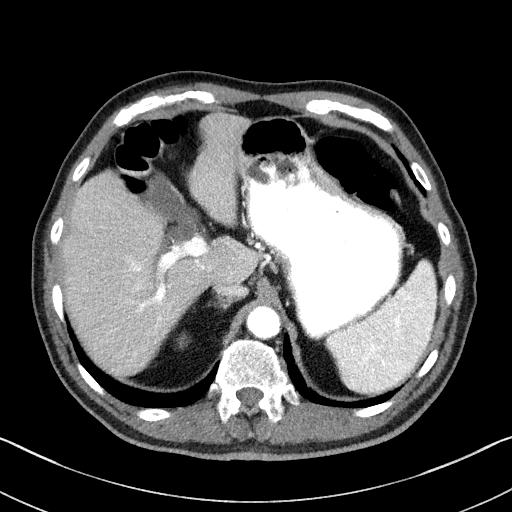
[im 85/122  mediastinal]
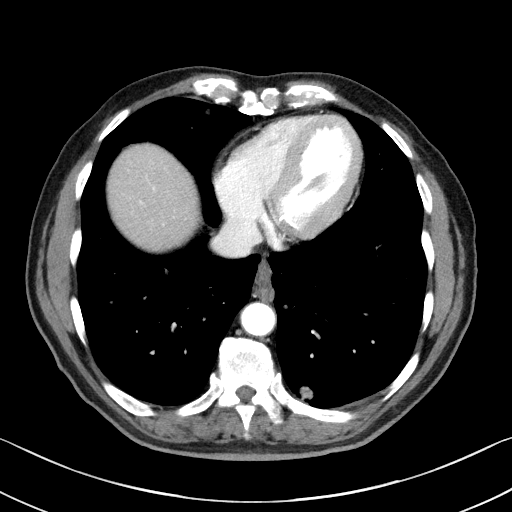
[im 97/122  mediastinal]
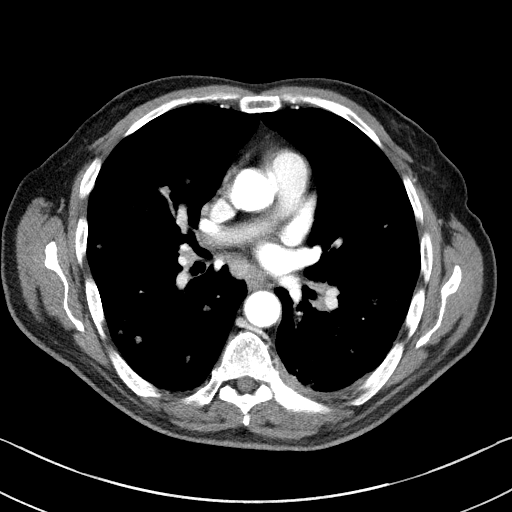
[im 109/122  mediastinal]
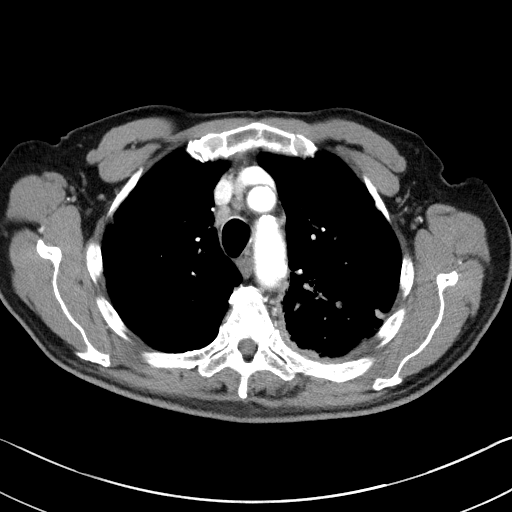
[im 109/122  bone]
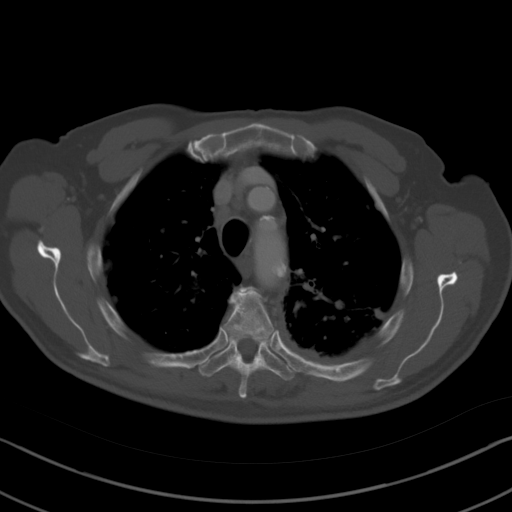

[Series 5: coronals · coronal · 0.75mm/px · 3 of 141 slices shown]
[im 29/141  mediastinal]
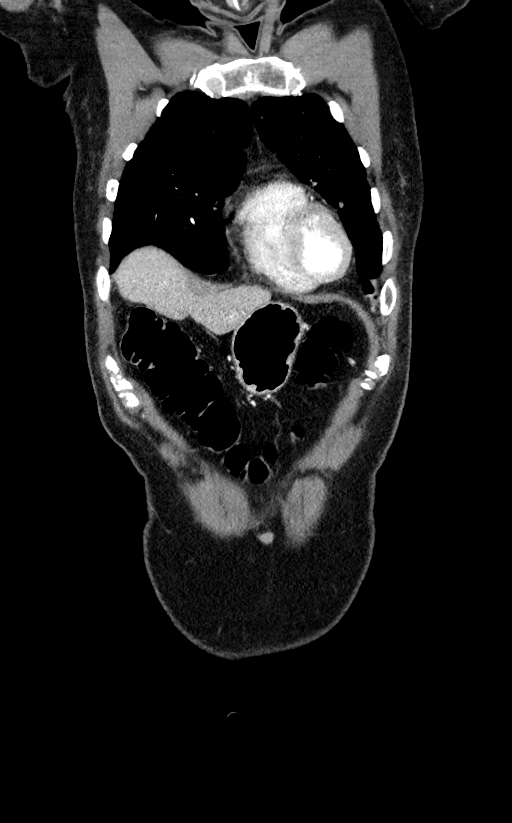
[im 57/141  mediastinal]
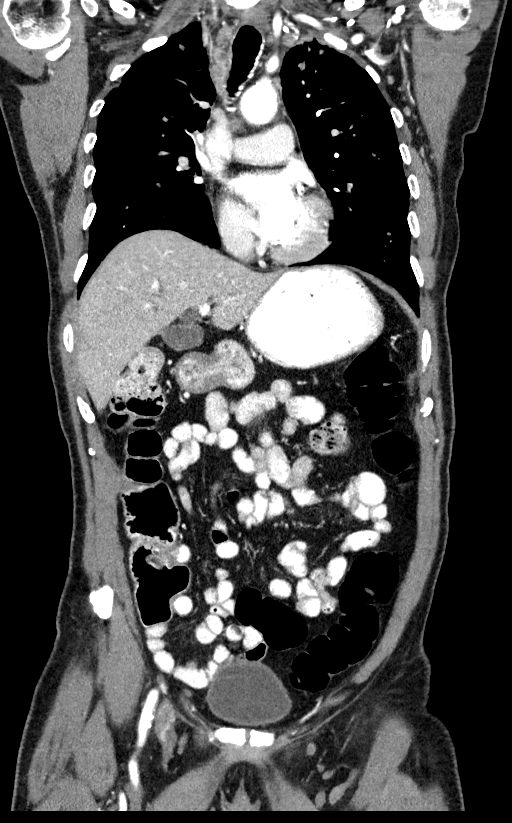
[im 85/141  mediastinal]
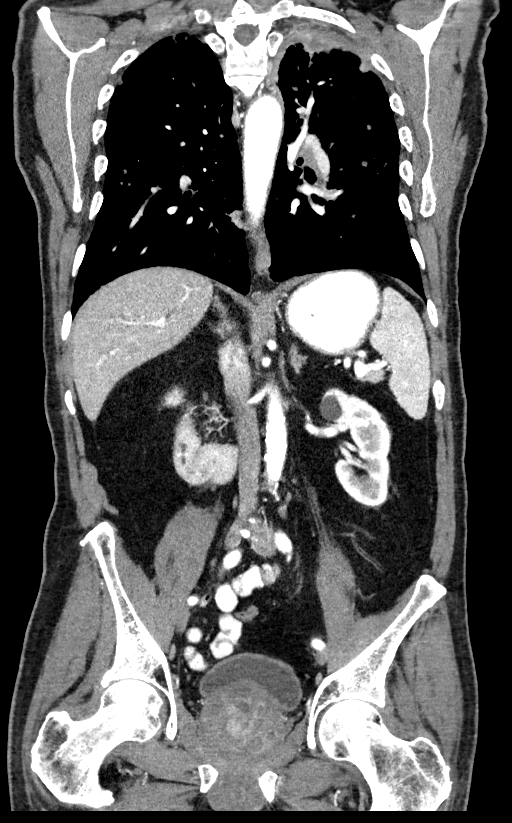

[12 of 36 positions shown; findings below may reference images not displayed]

FINDINGS: CT CHEST FINDINGS

Cardiovascular: Aortic atherosclerosis. Normal heart size.
Three-vessel coronary artery calcifications. No pericardial
effusion.

Mediastinum/Nodes: Slight interval decrease in size in subcarinal
lymph node or conglomerate measuring 3.3 x 1.8 cm, previously 4.1 x
2.5 cm (series 2, image 24). Additional left hilar, pretracheal, AP
window, and superior mediastinal lymph nodes are unchanged. Thyroid
gland, trachea, and esophagus demonstrate no significant findings.

Lungs/Pleura: Interval decrease in size of a primary mass of the
left pulmonary apex, measuring 3.7 x 3.5 cm, previously 4.7 x 4.5 cm
when measured similarly (series 2, image 8). There are innumerable
bilateral pulmonary nodules, generally spiculated in morphology, the
majority not significantly changed compared to prior examination, an
index nodule of the right upper lobe measuring 1.2 x 0.8 cm (series
4, image 36). Some nodules however are slightly decreased in size,
for example a nodule of the medial costophrenic recess, measuring
1.3 x 0.8 cm, previously 1.7 x 1.1 cm (series 4, image 110). A
previously seen subpleural, rounded opacity of the lateral segment
right middle lobe with central clearing is significantly reduced in
size, measuring 1.4 x 1.3 cm, previously 2.9 x 2.3 cm (series 4,
image 67). No pleural effusion or pneumothorax.

Musculoskeletal: No chest wall mass. No significant change in
osseous metastatic lesions, a lytic lesion of the inferior endplate
of T8 (series 6, image 96).

CT ABDOMEN PELVIS FINDINGS

Hepatobiliary: No solid liver abnormality is seen. No gallstones,
gallbladder wall thickening, or biliary dilatation.

Pancreas: Unremarkable. No pancreatic ductal dilatation or
surrounding inflammatory changes.

Spleen: Normal in size without significant abnormality.

Adrenals/Urinary Tract: Adrenal glands are unremarkable. Kidneys are
normal, without renal calculi, solid lesion, or hydronephrosis.
Bladder is unremarkable.

Stomach/Bowel: Stomach is within normal limits. Appendix appears
normal. No evidence of bowel wall thickening, distention, or
inflammatory changes.

Vascular/Lymphatic: No significant vascular findings are present. No
enlarged abdominal or pelvic lymph nodes.

Reproductive: Prostatomegaly and median lobe hypertrophy.

Other: No abdominal wall hernia or abnormality. No abdominopelvic
ascites.

Musculoskeletal: New sclerotic osseous lesion of the L1 vertebral
body.
IMPRESSION: 1. Interval decrease in size of a primary mass of the left pulmonary
apex, consistent with treatment response.
2. There are innumerable bilateral pulmonary nodules, generally
spiculated in morphology, the majority not significantly changed
compared to prior examination, however some nodules are slightly
decreased in size. Findings are consistent with stable to slightly
improved pulmonary metastatic disease.
3. Slight interval decrease in size in subcarinal lymph node or
conglomerate. Additional left hilar, pretracheal, AP window, and
superior mediastinal lymph nodes are unchanged, consistent with
stable to slightly improved nodal metastatic disease.
4. No significant change in a lytic lesion of the inferior endplate
of T8. New post treatment sclerosis of a lesion of the L1 vertebral
body. Note that the great majority of widespread FDG avid osseous
metastatic disease seen on prior PET examination is not appreciated
by CT.
5. Aortic Atherosclerosis ([Z0]-[Z0]).

## 2020-06-04 MED ORDER — IOHEXOL 300 MG/ML  SOLN
100.0000 mL | Freq: Once | INTRAMUSCULAR | Status: AC | PRN
  Administered 2020-06-04: 100 mL via INTRAVENOUS

## 2020-06-05 ENCOUNTER — Ambulatory Visit
Admission: RE | Admit: 2020-06-05 | Discharge: 2020-06-05 | Disposition: A | Discharge status: Home / Self Care | Payer: Medicare PPO | Source: Ambulatory Visit | Attending: Radiation Oncology | Admitting: Radiation Oncology

## 2020-06-05 ENCOUNTER — Other Ambulatory Visit: Payer: Self-pay

## 2020-06-05 VITALS — BP 125/64 | HR 76 | Temp 96.8°F | Resp 18 | Ht 69.0 in | Wt 148.2 lb

## 2020-06-05 DIAGNOSIS — C7931 Secondary malignant neoplasm of brain: Secondary | ICD-10-CM | POA: Insufficient documentation

## 2020-06-05 DIAGNOSIS — C3412 Malignant neoplasm of upper lobe, left bronchus or lung: Secondary | ICD-10-CM | POA: Diagnosis not present

## 2020-06-05 DIAGNOSIS — Z923 Personal history of irradiation: Secondary | ICD-10-CM | POA: Insufficient documentation

## 2020-06-05 NOTE — Progress Notes (Signed)
Radiation Oncology         [phone] ________________________________  Name: Phillip TUPY MRN: [id number]  Date: 06/05/2020  DOB: [birth year]  Follow-Up Visit Note  Outpatient  CC: Seward Carol, MD  Curt Bears, MD  Diagnosis and Prior Radiotherapy:    ICD-10-CM   1. Brain metastases ([place])  C79.31     CHIEF COMPLAINT: Here for follow-up and surveillance of brain metastases  Narrative:  The patient returns today for routine follow-up.  [name] with his wife.  He presents in a wheelchair. Patient presents today for 1 month F/U after completing SRS treatment. Wife reports that patient dealt with dizziness after completing treatment that lasted until last week. She states he has generalized bone pain first thing in the mornings and if she stays in bed all day. She reports he is not very physically active, and does not utlilze the computer as much as he used to; mainly sits and rests for most of the day. Wife reports no appetite and patient eating poorly. Patient had an eye infection in right eye that HH-RNs from [facility] helped manage with steroid/antibiotic eye drops.  In particular he has decreased strength in his upper legs and she is wondering if this is due to his cholesterol medication.  She is wondering if this medication should be stopped.  No severe headaches                              ALLERGIES:  is allergic to codeine, hydrocodone, oxycodone, and penicillins.  Meds: Current Outpatient Medications  Medication Sig Dispense Refill  . acetaminophen (TYLENOL) 325 MG tablet Take 325-650 mg by mouth every 6 (six) hours as needed for moderate pain or headache.    Marland Kitchen atorvastatin (LIPITOR) 20 MG tablet Take 20 mg by mouth daily.    . citalopram (CELEXA) 20 MG tablet Take 1 tablet (20 mg total) by mouth daily. 30 tablet 3  . dronabinol (MARINOL) 2.5 MG capsule Take 1 capsule (2.5 mg total) by mouth 2 (two) times daily before a meal. (Patient taking differently: Take 2.5 mg by  mouth 2 (two) times daily before a meal. Patient is only taking once a day) 60 capsule 2  . folic acid (FOLVITE) 1 MG tablet Take 1 tablet (1 mg total) by mouth daily. 30 tablet 4  . levothyroxine (SYNTHROID) 88 MCG tablet Take 88 mcg by mouth daily before breakfast.    . Multiple Vitamins-Minerals (CENTRUM SILVER 50+MEN) TABS Take 1 tablet by mouth daily.    Marland Kitchen neomycin-polymyxin b-dexamethasone (MAXITROL) 3.5-10000-0.1 SUSP Place 1 drop into the right eye every 8 (eight) hours.    . prochlorperazine (COMPAZINE) 10 MG tablet Take 1 tablet (10 mg total) by mouth every 6 (six) hours as needed for nausea or vomiting. 30 tablet 0  . tamsulosin (FLOMAX) 0.4 MG CAPS capsule Take 0.4 mg by mouth every evening.     . benzonatate (TESSALON) 200 MG capsule Take 1 capsule (200 mg total) by mouth 4 (four) times daily as needed for cough. 60 capsule 1  . esomeprazole (NEXIUM) 20 MG capsule Take 20 mg by mouth daily as needed (acid reflux).     . LORazepam (ATIVAN) 0.5 MG tablet Take 1-2 tablets by mouth 30 minutes before procedures, PRN anxiety. 8 tablet 0   No current facility-administered medications for this encounter.    Physical Findings: The patient is in no acute distress. Patient is alert and oriented.  He appears tired  height is 5\' 9"  (1.753 m) and weight is 148 lb 3.2 oz (67.2 kg). His tympanic temperature is 96.8 F (36 C) (abnormal). His blood pressure is 125/64 and his pulse is 76. His respiration is 18 and oxygen saturation is 99%. .    Patient presents in a wheelchair.  Appears tired.  Strength is symmetric in all of his extremities.  Oral cavity is clear without any evidence of thrush.  Coordination is grossly intact.  Lab Findings: Lab Results  Component Value Date   WBC 4.0 06/08/2020   HGB 12.1 (L) 06/08/2020   HCT 37.7 (L) 06/08/2020   MCV 90.8 06/08/2020   PLT 287 06/08/2020    Radiographic Findings: CT CHEST ABDOMEN PELVIS W CONTRAST  Result Date: 06/04/2020 CLINICAL DATA:   Lung cancer restaging EXAM: CT CHEST, ABDOMEN, AND PELVIS WITH CONTRAST TECHNIQUE: Multidetector CT imaging of the chest, abdomen and pelvis was performed following the standard protocol during bolus administration of intravenous contrast. CONTRAST:  129mL OMNIPAQUE IOHEXOL 300 MG/ML SOLN, additional oral enteric contrast COMPARISON:  PET-CT, 03/20/2020, CT chest, 04/04/2020 FINDINGS: CT CHEST FINDINGS Cardiovascular: Aortic atherosclerosis. Normal heart size. Three-vessel coronary artery calcifications. No pericardial effusion. Mediastinum/Nodes: Slight interval decrease in size in subcarinal lymph node or conglomerate measuring 3.3 x 1.8 cm, previously 4.1 x 2.5 cm (series 2, image 24). Additional left hilar, pretracheal, AP window, and superior mediastinal lymph nodes are unchanged. Thyroid gland, trachea, and esophagus demonstrate no significant findings. Lungs/Pleura: Interval decrease in size of a primary mass of the left pulmonary apex, measuring 3.7 x 3.5 cm, previously 4.7 x 4.5 cm when measured similarly (series 2, image 8). There are innumerable bilateral pulmonary nodules, generally spiculated in morphology, the majority not significantly changed compared to prior examination, an index nodule of the right upper lobe measuring 1.2 x 0.8 cm (series 4, image 36). Some nodules however are slightly decreased in size, for example a nodule of the medial costophrenic recess, measuring 1.3 x 0.8 cm, previously 1.7 x 1.1 cm (series 4, image 110). A previously seen subpleural, rounded opacity of the lateral segment right middle lobe with central clearing is significantly reduced in size, measuring 1.4 x 1.3 cm, previously 2.9 x 2.3 cm (series 4, image 67). No pleural effusion or pneumothorax. Musculoskeletal: No chest wall mass. No significant change in osseous metastatic lesions, a lytic lesion of the inferior endplate of T8 (series 6, image 96). CT ABDOMEN PELVIS FINDINGS Hepatobiliary: No solid liver abnormality  is seen. No gallstones, gallbladder wall thickening, or biliary dilatation. Pancreas: Unremarkable. No pancreatic ductal dilatation or surrounding inflammatory changes. Spleen: Normal in size without significant abnormality. Adrenals/Urinary Tract: Adrenal glands are unremarkable. Kidneys are normal, without renal calculi, solid lesion, or hydronephrosis. Bladder is unremarkable. Stomach/Bowel: Stomach is within normal limits. Appendix appears normal. No evidence of bowel wall thickening, distention, or inflammatory changes. Vascular/Lymphatic: No significant vascular findings are present. No enlarged abdominal or pelvic lymph nodes. Reproductive: Prostatomegaly and median lobe hypertrophy. Other: No abdominal wall hernia or abnormality. No abdominopelvic ascites. Musculoskeletal: New sclerotic osseous lesion of the L1 vertebral body. IMPRESSION: 1. Interval decrease in size of a primary mass of the left pulmonary apex, consistent with treatment response. 2. There are innumerable bilateral pulmonary nodules, generally spiculated in morphology, the majority not significantly changed compared to prior examination, however some nodules are slightly decreased in size. Findings are consistent with stable to slightly improved pulmonary metastatic disease. 3. Slight interval decrease in size in subcarinal lymph  node or conglomerate. Additional left hilar, pretracheal, AP window, and superior mediastinal lymph nodes are unchanged, consistent with stable to slightly improved nodal metastatic disease. 4. No significant change in a lytic lesion of the inferior endplate of T8. New post treatment sclerosis of a lesion of the L1 vertebral body. Note that the great majority of widespread FDG avid osseous metastatic disease seen on prior PET examination is not appreciated by CT. 5. Aortic Atherosclerosis (ICD10-I70.0). Electronically Signed   By: Eddie Candle M.D.   On: 06/04/2020 22:00    Impression/Plan: The patient is doing  relatively well after radiosurgery.  We spoke in detail about ways to promote improved p.o. intake and optimize his nutrition.  At this time the patient's wife does not feel that a nutritional consult will be helpful.  She has a good understanding of nutrition and the biggest struggle is that the patient has a suboptimal appetite.  We discussed the results to his systemic CT imaging which I personally reviewed.  This shows a good response to systemic therapy thus far.  We will pursue an MRI of his brain in 2 months and he will follow-up with neurosurgery after that scan.  I will see him back for his subsequent scan.  They are pleased with this plan.  As for issues with decreased strength in his quadriceps, this is likely due to deconditioning and sedentary lifestyle.  I doubt that his statin medication is contributing to this but they will discuss it further with his PCP and/or medical oncology; it is not unreasonable for him to stop his statin medication given his comorbidities.    On date of service, in total, I spent 20 minutes on this encounter. Patient was seen in person.  _____________________________________   Eppie Gibson, MD

## 2020-06-05 NOTE — Progress Notes (Signed)
Patient presents today for 1 month F/U after completing SRS treatment. Wife reports that patient dealt with dizziness after completing treatment that lasted until last week. She states he has generalized bone pain first thing in the mornings and if she stays in bed all day. She reports he is not very physically active, and does not utlilze the computer as much as he used to; mainly sits and rests for most of the day. Wife reports no appetite and patient eating poorly. Patient had an eye infection in right eye that HH-RNs from [facility] helped manage with steroid/antibiotic eye drops. Wife is concerned about patient's cholesterol and following up with PCP.   Wt Readings from Last 3 Encounters:  06/05/20 148 lb 3.2 oz (67.2 kg)  05/18/20 151 lb 1.6 oz (68.5 kg)  05/11/20 155 lb (70.3 kg)   Vitals:   06/05/20 1140  BP: 125/64  Pulse: 76  Resp: 18  Temp: (!) 96.8 F (36 C)  SpO2: 99%

## 2020-06-08 ENCOUNTER — Inpatient Hospital Stay: Payer: Medicare PPO | Admitting: Internal Medicine

## 2020-06-08 ENCOUNTER — Encounter: Payer: Self-pay | Admitting: Internal Medicine

## 2020-06-08 ENCOUNTER — Other Ambulatory Visit: Payer: Self-pay

## 2020-06-08 ENCOUNTER — Inpatient Hospital Stay: Payer: Medicare PPO

## 2020-06-08 VITALS — BP 132/58 | HR 75 | Temp 97.6°F | Resp 18 | Ht 69.0 in | Wt 148.5 lb

## 2020-06-08 DIAGNOSIS — C3490 Malignant neoplasm of unspecified part of unspecified bronchus or lung: Secondary | ICD-10-CM | POA: Diagnosis not present

## 2020-06-08 DIAGNOSIS — C3492 Malignant neoplasm of unspecified part of left bronchus or lung: Secondary | ICD-10-CM

## 2020-06-08 DIAGNOSIS — F329 Major depressive disorder, single episode, unspecified: Secondary | ICD-10-CM | POA: Diagnosis not present

## 2020-06-08 DIAGNOSIS — C7951 Secondary malignant neoplasm of bone: Secondary | ICD-10-CM | POA: Diagnosis not present

## 2020-06-08 DIAGNOSIS — Z5112 Encounter for antineoplastic immunotherapy: Secondary | ICD-10-CM

## 2020-06-08 DIAGNOSIS — Z5189 Encounter for other specified aftercare: Secondary | ICD-10-CM | POA: Diagnosis not present

## 2020-06-08 DIAGNOSIS — Z79899 Other long term (current) drug therapy: Secondary | ICD-10-CM | POA: Diagnosis not present

## 2020-06-08 DIAGNOSIS — Z5111 Encounter for antineoplastic chemotherapy: Secondary | ICD-10-CM | POA: Diagnosis not present

## 2020-06-08 DIAGNOSIS — C7931 Secondary malignant neoplasm of brain: Secondary | ICD-10-CM

## 2020-06-08 LAB — CBC WITH DIFFERENTIAL (CANCER CENTER ONLY)
Abs Immature Granulocytes: 0.02 10*3/uL (ref 0.00–0.07)
Basophils Absolute: 0 10*3/uL (ref 0.0–0.1)
Basophils Relative: 1 %
Eosinophils Absolute: 0.1 10*3/uL (ref 0.0–0.5)
Eosinophils Relative: 3 %
HCT: 37.7 % — ABNORMAL LOW (ref 39.0–52.0)
Hemoglobin: 12.1 g/dL — ABNORMAL LOW (ref 13.0–17.0)
Immature Granulocytes: 1 %
Lymphocytes Relative: 34 %
Lymphs Abs: 1.4 10*3/uL (ref 0.7–4.0)
MCH: 29.2 pg (ref 26.0–34.0)
MCHC: 32.1 g/dL (ref 30.0–36.0)
MCV: 90.8 fL (ref 80.0–100.0)
Monocytes Absolute: 0.7 10*3/uL (ref 0.1–1.0)
Monocytes Relative: 18 %
Neutro Abs: 1.7 10*3/uL (ref 1.7–7.7)
Neutrophils Relative %: 43 %
Platelet Count: 287 10*3/uL (ref 150–400)
RBC: 4.15 MIL/uL — ABNORMAL LOW (ref 4.22–5.81)
RDW: 18.9 % — ABNORMAL HIGH (ref 11.5–15.5)
WBC Count: 4 10*3/uL (ref 4.0–10.5)
nRBC: 0 % (ref 0.0–0.2)

## 2020-06-08 LAB — CMP (CANCER CENTER ONLY)
ALT: 30 U/L (ref 0–44)
AST: 30 U/L (ref 15–41)
Albumin: 3.4 g/dL — ABNORMAL LOW (ref 3.5–5.0)
Alkaline Phosphatase: 135 U/L — ABNORMAL HIGH (ref 38–126)
Anion gap: 7 (ref 5–15)
BUN: 7 mg/dL — ABNORMAL LOW (ref 8–23)
CO2: 25 mmol/L (ref 22–32)
Calcium: 9.8 mg/dL (ref 8.9–10.3)
Chloride: 106 mmol/L (ref 98–111)
Creatinine: 1.09 mg/dL (ref 0.61–1.24)
GFR, Est AFR Am: >60 mL/min (ref >60)
GFR, Estimated: >60 mL/min (ref >60)
Glucose, Bld: 127 mg/dL — ABNORMAL HIGH (ref 70–99)
Potassium: 3.7 mmol/L (ref 3.5–5.1)
Sodium: 138 mmol/L (ref 135–145)
Total Bilirubin: 0.5 mg/dL (ref 0.3–1.2)
Total Protein: 7.3 g/dL (ref 6.5–8.1)

## 2020-06-08 MED ORDER — SODIUM CHLORIDE 0.9 % IV SOLN
500.0000 mg/m2 | Freq: Once | INTRAVENOUS | Status: AC
  Administered 2020-06-08: 900 mg via INTRAVENOUS
  Filled 2020-06-08: qty 20

## 2020-06-08 MED ORDER — SODIUM CHLORIDE 0.9 % IV SOLN
150.0000 mg | Freq: Once | INTRAVENOUS | Status: AC
  Administered 2020-06-08: 150 mg via INTRAVENOUS
  Filled 2020-06-08: qty 150

## 2020-06-08 MED ORDER — PALONOSETRON HCL INJECTION 0.25 MG/5ML
INTRAVENOUS | Status: AC
  Filled 2020-06-08: qty 5

## 2020-06-08 MED ORDER — SODIUM CHLORIDE 0.9 % IV SOLN
10.0000 mg | Freq: Once | INTRAVENOUS | Status: AC
  Administered 2020-06-08: 10 mg via INTRAVENOUS
  Filled 2020-06-08: qty 10

## 2020-06-08 MED ORDER — SODIUM CHLORIDE 0.9 % IV SOLN
200.0000 mg | Freq: Once | INTRAVENOUS | Status: AC
  Administered 2020-06-08: 200 mg via INTRAVENOUS
  Filled 2020-06-08: qty 8

## 2020-06-08 MED ORDER — SODIUM CHLORIDE 0.9 % IV SOLN
401.5000 mg | Freq: Once | INTRAVENOUS | Status: AC
  Administered 2020-06-08: 400 mg via INTRAVENOUS
  Filled 2020-06-08: qty 40

## 2020-06-08 MED ORDER — SODIUM CHLORIDE 0.9 % IV SOLN
Freq: Once | INTRAVENOUS | Status: AC
  Filled 2020-06-08: qty 250

## 2020-06-08 MED ORDER — PALONOSETRON HCL INJECTION 0.25 MG/5ML
0.2500 mg | Freq: Once | INTRAVENOUS | Status: AC
  Administered 2020-06-08: 0.25 mg via INTRAVENOUS

## 2020-06-08 NOTE — Patient Instructions (Signed)
[facility] Discharge Instructions for Patients Receiving Chemotherapy  Today you received the following chemotherapy agents: pembrolizumab, pemetrexed, and carboplatin.  To help prevent nausea and vomiting after your treatment, we encourage you to take your nausea medication as directed.   If you develop nausea and vomiting that is not controlled by your nausea medication, call the clinic.   BELOW ARE SYMPTOMS THAT SHOULD BE REPORTED IMMEDIATELY:  *FEVER GREATER THAN 100.5 F  *CHILLS WITH OR WITHOUT FEVER  NAUSEA AND VOMITING THAT IS NOT CONTROLLED WITH YOUR NAUSEA MEDICATION  *UNUSUAL SHORTNESS OF BREATH  *UNUSUAL BRUISING OR BLEEDING  TENDERNESS IN MOUTH AND THROAT WITH OR WITHOUT PRESENCE OF ULCERS  *URINARY PROBLEMS  *BOWEL PROBLEMS  UNUSUAL RASH Items with * indicate a potential emergency and should be followed up as soon as possible.  Feel free to call the clinic should you have any questions or concerns. The clinic phone number is [phone].  Please show the CHEMO ALERT CARD at check-in to the Emergency Department and triage nurse.   

## 2020-06-08 NOTE — Progress Notes (Signed)
[place] [phone]   [phone]  OFFICE PROGRESS NOTE  Seward Carol, MD [address] [place] [address]  DIAGNOSIS: Stage IV (T4, N3, M1c) non-small cell lung cancer, adenocarcinoma presented with left upper lobe lung mass in addition to bilateral mediastinal lymphadenopathy as well as bilateral pulmonary nodules and metastatic disease to the bones and brain diagnosed in July 2021.  Molecular studies by [employer] showed no actionable mutations.  PRIOR THERAPY: None  CURRENT THERAPY: Systemic chemotherapy with carboplatin for AUC of 5, Alimta 500 mg/M2 and Keytruda 200 mg IV every 3 weeks.  First dose 04/06/2020.  Status post 3 cycles.  INTERVAL HISTORY: Phillip Franklin 79 y.o. male returns to the clinic today for follow-up visit accompanied by his wife Gerald Stabs and his daughter [name] was available by FaceTime during the visit.  The patient continues to complain of increasing fatigue and weakness as well as lack of appetite and weight loss.  He has no significant complaints with his systemic chemotherapy and has been tolerating it much better especially the last cycle.  He denied having any current chest pain but has mild shortness of breath with exertion with mild cough and no hemoptysis.  He denied having any fever or chills.  He has no nausea, vomiting, diarrhea or constipation.  He denied having any headache or visual changes.  He had repeat CT scan of the chest, abdomen pelvis performed recently and he is here for evaluation and discussion of his scan results.   MEDICAL HISTORY: Past Medical History:  Diagnosis Date  . Depression   . GERD (gastroesophageal reflux disease)    occasional, diet controlled  . High cholesterol   . Hypertension    no longer taking medication - states it's undercontrol  . Hypothyroid     ALLERGIES:  is allergic to codeine, hydrocodone, oxycodone, and penicillins.  MEDICATIONS:  Current  Outpatient Medications  Medication Sig Dispense Refill  . acetaminophen (TYLENOL) 325 MG tablet Take 325-650 mg by mouth every 6 (six) hours as needed for moderate pain or headache.    Marland Kitchen atorvastatin (LIPITOR) 20 MG tablet Take 20 mg by mouth daily.    . benzonatate (TESSALON) 200 MG capsule Take 1 capsule (200 mg total) by mouth 4 (four) times daily as needed for cough. 60 capsule 1  . citalopram (CELEXA) 20 MG tablet Take 1 tablet (20 mg total) by mouth daily. 30 tablet 3  . dronabinol (MARINOL) 2.5 MG capsule Take 1 capsule (2.5 mg total) by mouth 2 (two) times daily before a meal. (Patient taking differently: Take 2.5 mg by mouth 2 (two) times daily before a meal. Patient is only taking once a day) 60 capsule 2  . esomeprazole (NEXIUM) 20 MG capsule Take 20 mg by mouth daily as needed (acid reflux).     . folic acid (FOLVITE) 1 MG tablet Take 1 tablet (1 mg total) by mouth daily. 30 tablet 4  . levothyroxine (SYNTHROID) 88 MCG tablet Take 88 mcg by mouth daily before breakfast.    . LORazepam (ATIVAN) 0.5 MG tablet Take 1-2 tablets by mouth 30 minutes before procedures, PRN anxiety. 8 tablet 0  . Multiple Vitamins-Minerals (CENTRUM SILVER 50+MEN) TABS Take 1 tablet by mouth daily.    Marland Kitchen neomycin-polymyxin b-dexamethasone (MAXITROL) 3.5-10000-0.1 SUSP Place 1 drop into the right eye every 8 (eight) hours.    . prochlorperazine (COMPAZINE) 10 MG tablet Take 1 tablet (10 mg total) by  mouth every 6 (six) hours as needed for nausea or vomiting. 30 tablet 0  . tamsulosin (FLOMAX) 0.4 MG CAPS capsule Take 0.4 mg by mouth every evening.      No current facility-administered medications for this visit.    SURGICAL HISTORY:  Past Surgical History:  Procedure Laterality Date  . BRONCHIAL NEEDLE ASPIRATION BIOPSY  03/24/2020   Procedure: BRONCHIAL NEEDLE ASPIRATION BIOPSIES;  Surgeon: Candee Furbish, MD;  Location: [facility] ENDOSCOPY;  Service: Pulmonary;;  . HEMORRHOID SURGERY    . VIDEO BRONCHOSCOPY WITH  ENDOBRONCHIAL ULTRASOUND N/A 03/24/2020   Procedure: VIDEO BRONCHOSCOPY WITH ENDOBRONCHIAL ULTRASOUND;  Surgeon: Candee Furbish, MD;  Location: [facility] ENDOSCOPY;  Service: Pulmonary;  Laterality: N/A;    REVIEW OF SYSTEMS:  Constitutional: positive for anorexia, fatigue and weight loss Eyes: negative Ears, nose, mouth, throat, and face: negative Respiratory: positive for cough and dyspnea on exertion Cardiovascular: negative Gastrointestinal: negative Genitourinary:negative Integument/breast: negative Hematologic/lymphatic: negative Musculoskeletal:negative Neurological: negative Behavioral/Psych: positive for depression Endocrine: negative Allergic/Immunologic: negative   PHYSICAL EXAMINATION: General appearance: alert, cooperative, fatigued and no distress Head: Normocephalic, without obvious abnormality, atraumatic Neck: no adenopathy, no JVD, supple, symmetrical, trachea midline and thyroid not enlarged, symmetric, no tenderness/mass/nodules Lymph nodes: Cervical, supraclavicular, and axillary nodes normal. Resp: clear to auscultation bilaterally Back: symmetric, no curvature. ROM normal. No CVA tenderness. Cardio: regular rate and rhythm, S1, S2 normal, no murmur, click, rub or gallop GI: soft, non-tender; bowel sounds normal; no masses,  no organomegaly Extremities: extremities normal, atraumatic, no cyanosis or edema Neurologic: Alert and oriented X 3, normal strength and tone. Normal symmetric reflexes. Normal coordination and gait  ECOG PERFORMANCE STATUS: 1 - Symptomatic but completely ambulatory  Blood pressure (!) 132/58, pulse 75, temperature 97.6 F (36.4 C), temperature source Tympanic, resp. rate 18, height 5\' 9"  (1.753 m), weight 148 lb 8 oz (67.4 kg), SpO2 98 %.  LABORATORY DATA: Lab Results  Component Value Date   WBC 4.0 06/08/2020   HGB 12.1 (L) 06/08/2020   HCT 37.7 (L) 06/08/2020   MCV 90.8 06/08/2020   PLT 287 06/08/2020      Chemistry      Component  Value Date/Time   NA 139 06/01/2020 1230   K 4.1 06/01/2020 1230   CL 105 06/01/2020 1230   CO2 25 06/01/2020 1230   BUN 7 (L) 06/01/2020 1230   CREATININE 1.08 06/01/2020 1230      Component Value Date/Time   CALCIUM 9.8 06/01/2020 1230   ALKPHOS 159 (H) 06/01/2020 1230   AST 33 06/01/2020 1230   ALT 38 06/01/2020 1230   BILITOT 0.6 06/01/2020 1230       RADIOGRAPHIC STUDIES: CT CHEST ABDOMEN PELVIS W CONTRAST  Result Date: 06/04/2020 CLINICAL DATA:  Lung cancer restaging EXAM: CT CHEST, ABDOMEN, AND PELVIS WITH CONTRAST TECHNIQUE: Multidetector CT imaging of the chest, abdomen and pelvis was performed following the standard protocol during bolus administration of intravenous contrast. CONTRAST:  118mL OMNIPAQUE IOHEXOL 300 MG/ML SOLN, additional oral enteric contrast COMPARISON:  PET-CT, 03/20/2020, CT chest, 04/04/2020 FINDINGS: CT CHEST FINDINGS Cardiovascular: Aortic atherosclerosis. Normal heart size. Three-vessel coronary artery calcifications. No pericardial effusion. Mediastinum/Nodes: Slight interval decrease in size in subcarinal lymph node or conglomerate measuring 3.3 x 1.8 cm, previously 4.1 x 2.5 cm (series 2, image 24). Additional left hilar, pretracheal, AP window, and superior mediastinal lymph nodes are unchanged. Thyroid gland, trachea, and esophagus demonstrate no significant findings. Lungs/Pleura: Interval decrease in size of a primary mass of the left  pulmonary apex, measuring 3.7 x 3.5 cm, previously 4.7 x 4.5 cm when measured similarly (series 2, image 8). There are innumerable bilateral pulmonary nodules, generally spiculated in morphology, the majority not significantly changed compared to prior examination, an index nodule of the right upper lobe measuring 1.2 x 0.8 cm (series 4, image 36). Some nodules however are slightly decreased in size, for example a nodule of the medial costophrenic recess, measuring 1.3 x 0.8 cm, previously 1.7 x 1.1 cm (series 4, image 110).  A previously seen subpleural, rounded opacity of the lateral segment right middle lobe with central clearing is significantly reduced in size, measuring 1.4 x 1.3 cm, previously 2.9 x 2.3 cm (series 4, image 67). No pleural effusion or pneumothorax. Musculoskeletal: No chest wall mass. No significant change in osseous metastatic lesions, a lytic lesion of the inferior endplate of T8 (series 6, image 96). CT ABDOMEN PELVIS FINDINGS Hepatobiliary: No solid liver abnormality is seen. No gallstones, gallbladder wall thickening, or biliary dilatation. Pancreas: Unremarkable. No pancreatic ductal dilatation or surrounding inflammatory changes. Spleen: Normal in size without significant abnormality. Adrenals/Urinary Tract: Adrenal glands are unremarkable. Kidneys are normal, without renal calculi, solid lesion, or hydronephrosis. Bladder is unremarkable. Stomach/Bowel: Stomach is within normal limits. Appendix appears normal. No evidence of bowel wall thickening, distention, or inflammatory changes. Vascular/Lymphatic: No significant vascular findings are present. No enlarged abdominal or pelvic lymph nodes. Reproductive: Prostatomegaly and median lobe hypertrophy. Other: No abdominal wall hernia or abnormality. No abdominopelvic ascites. Musculoskeletal: New sclerotic osseous lesion of the L1 vertebral body. IMPRESSION: 1. Interval decrease in size of a primary mass of the left pulmonary apex, consistent with treatment response. 2. There are innumerable bilateral pulmonary nodules, generally spiculated in morphology, the majority not significantly changed compared to prior examination, however some nodules are slightly decreased in size. Findings are consistent with stable to slightly improved pulmonary metastatic disease. 3. Slight interval decrease in size in subcarinal lymph node or conglomerate. Additional left hilar, pretracheal, AP window, and superior mediastinal lymph nodes are unchanged, consistent with stable to  slightly improved nodal metastatic disease. 4. No significant change in a lytic lesion of the inferior endplate of T8. New post treatment sclerosis of a lesion of the L1 vertebral body. Note that the great majority of widespread FDG avid osseous metastatic disease seen on prior PET examination is not appreciated by CT. 5. Aortic Atherosclerosis (ICD10-I70.0). Electronically Signed   By: Eddie Candle M.D.   On: 06/04/2020 22:00    ASSESSMENT AND PLAN: This is a very pleasant 79 years old white male recently diagnosed with a stage IV (T4, N3, M1C) non-small cell lung cancer, adenocarcinoma presented with left upper lobe lung mass in addition to bilateral pulmonary nodules as well as bilateral hilar and mediastinal lymphadenopathy and metastatic disease to the bones and brain diagnosed in July 2021. He underwent stereotactic radiotherapy to the metastatic brain lesion under the care of Dr. Isidore Moos. Unfortunately the molecular studies by [employer] was negative for actionable mutations.  The initial tissue biopsy was insufficient for molecular studies by [facility]. The patient had repeat biopsy for the molecular studies and the expectation is to have the result on 05/05/2020. He started systemic chemotherapy with carboplatin for AUC of 5, Alimta 500 mg/M2 and Keytruda 200 mg IV every 3 weeks status post 3 cycles.  He has been tolerating this treatment well with no concerning adverse effects. The patient had repeat CT scan of the chest, abdomen pelvis performed recently.  I  personally and independently reviewed the scans and discussed the result with the patient and his family today. His scan showed some improvement of his disease with decrease in the dominant mass and stable other pulmonary nodules. I recommended for the patient to proceed with cycle #4 today as planned. Starting from cycle #5 he will be treated with maintenance Alimta and Keytruda every 3 weeks. The patient will come back for follow-up  visit in 3 weeks for evaluation before starting cycle #5. For the depression, he resumed his treatment with Cymbalta. He was advised to call immediately if he has any other concerning symptoms in the interval. Disclaimer: This note was dictated with voice recognition software. Similar sounding words can inadvertently be transcribed and may not be corrected upon review.

## 2020-06-09 ENCOUNTER — Other Ambulatory Visit: Payer: Self-pay | Admitting: Internal Medicine

## 2020-06-09 ENCOUNTER — Encounter: Payer: Self-pay | Admitting: Radiation Oncology

## 2020-06-09 ENCOUNTER — Telehealth: Payer: Self-pay | Admitting: Medical Oncology

## 2020-06-09 MED ORDER — TRAMADOL HCL 50 MG PO TABS: 50.0000 mg | ORAL_TABLET | Freq: Four times a day (QID) | ORAL | 0 refills | Status: DC | PRN

## 2020-06-09 NOTE — Telephone Encounter (Signed)
Hallucinations with Oxycodone, Hydrocodone ,codeine.

## 2020-06-09 NOTE — Telephone Encounter (Signed)
I was planning to start him on Percocet or tramadol but I see severe hypersensitivity reaction.  Can you confirm with them what kind of reaction he has to pain medication?  Thank you.

## 2020-06-09 NOTE — Telephone Encounter (Signed)
I did send tramadol to his pharmacy.  I think [facility] not [facility]

## 2020-06-09 NOTE — Telephone Encounter (Signed)
Pain management- Wife is asking if pain medication was prescribed for pt . The pt understood that Dr Julien Nordmann was going to order pain medication.  I do not see anything on his MAR . [name] taking tylenol and it does not help.

## 2020-06-15 ENCOUNTER — Other Ambulatory Visit: Payer: Self-pay

## 2020-06-15 ENCOUNTER — Other Ambulatory Visit: Payer: Self-pay | Admitting: Physician Assistant

## 2020-06-15 ENCOUNTER — Inpatient Hospital Stay: Payer: Medicare PPO

## 2020-06-15 DIAGNOSIS — Z5189 Encounter for other specified aftercare: Secondary | ICD-10-CM | POA: Diagnosis not present

## 2020-06-15 DIAGNOSIS — Z5112 Encounter for antineoplastic immunotherapy: Secondary | ICD-10-CM | POA: Diagnosis not present

## 2020-06-15 DIAGNOSIS — F329 Major depressive disorder, single episode, unspecified: Secondary | ICD-10-CM | POA: Diagnosis not present

## 2020-06-15 DIAGNOSIS — Z79899 Other long term (current) drug therapy: Secondary | ICD-10-CM | POA: Diagnosis not present

## 2020-06-15 DIAGNOSIS — C3492 Malignant neoplasm of unspecified part of left bronchus or lung: Secondary | ICD-10-CM

## 2020-06-15 DIAGNOSIS — D702 Other drug-induced agranulocytosis: Secondary | ICD-10-CM

## 2020-06-15 DIAGNOSIS — C7951 Secondary malignant neoplasm of bone: Secondary | ICD-10-CM | POA: Diagnosis not present

## 2020-06-15 DIAGNOSIS — C7931 Secondary malignant neoplasm of brain: Secondary | ICD-10-CM | POA: Diagnosis not present

## 2020-06-15 DIAGNOSIS — C3490 Malignant neoplasm of unspecified part of unspecified bronchus or lung: Secondary | ICD-10-CM | POA: Diagnosis not present

## 2020-06-15 LAB — CBC WITH DIFFERENTIAL (CANCER CENTER ONLY)
Abs Immature Granulocytes: 0.01 10*3/uL (ref 0.00–0.07)
Basophils Absolute: 0 10*3/uL (ref 0.0–0.1)
Basophils Relative: 1 %
Eosinophils Absolute: 0 10*3/uL (ref 0.0–0.5)
Eosinophils Relative: 1 %
HCT: 36.8 % — ABNORMAL LOW (ref 39.0–52.0)
Hemoglobin: 11.8 g/dL — ABNORMAL LOW (ref 13.0–17.0)
Immature Granulocytes: 1 %
Lymphocytes Relative: 51 %
Lymphs Abs: 0.8 10*3/uL (ref 0.7–4.0)
MCH: 29.5 pg (ref 26.0–34.0)
MCHC: 32.1 g/dL (ref 30.0–36.0)
MCV: 92 fL (ref 80.0–100.0)
Monocytes Absolute: 0.1 10*3/uL (ref 0.1–1.0)
Monocytes Relative: 4 %
Neutro Abs: 0.7 10*3/uL — ABNORMAL LOW (ref 1.7–7.7)
Neutrophils Relative %: 42 %
Platelet Count: 148 10*3/uL — ABNORMAL LOW (ref 150–400)
RBC: 4 MIL/uL — ABNORMAL LOW (ref 4.22–5.81)
RDW: 17.9 % — ABNORMAL HIGH (ref 11.5–15.5)
WBC Count: 1.6 10*3/uL — ABNORMAL LOW (ref 4.0–10.5)
nRBC: 0 % (ref 0.0–0.2)

## 2020-06-15 LAB — CMP (CANCER CENTER ONLY)
ALT: 32 U/L (ref 0–44)
AST: 31 U/L (ref 15–41)
Albumin: 3.3 g/dL — ABNORMAL LOW (ref 3.5–5.0)
Alkaline Phosphatase: 143 U/L — ABNORMAL HIGH (ref 38–126)
Anion gap: 4 — ABNORMAL LOW (ref 5–15)
BUN: 11 mg/dL (ref 8–23)
CO2: 29 mmol/L (ref 22–32)
Calcium: 9.7 mg/dL (ref 8.9–10.3)
Chloride: 104 mmol/L (ref 98–111)
Creatinine: 1.01 mg/dL (ref 0.61–1.24)
GFR, Est AFR Am: >60 mL/min (ref >60)
GFR, Estimated: >60 mL/min (ref >60)
Glucose, Bld: 125 mg/dL — ABNORMAL HIGH (ref 70–99)
Potassium: 3.8 mmol/L (ref 3.5–5.1)
Sodium: 137 mmol/L (ref 135–145)
Total Bilirubin: 0.7 mg/dL (ref 0.3–1.2)
Total Protein: 7.2 g/dL (ref 6.5–8.1)

## 2020-06-16 ENCOUNTER — Other Ambulatory Visit: Payer: Self-pay

## 2020-06-16 ENCOUNTER — Inpatient Hospital Stay: Payer: Medicare PPO

## 2020-06-16 VITALS — BP 131/69 | HR 79 | Temp 98.1°F | Resp 18

## 2020-06-16 DIAGNOSIS — C7951 Secondary malignant neoplasm of bone: Secondary | ICD-10-CM | POA: Diagnosis not present

## 2020-06-16 DIAGNOSIS — C3490 Malignant neoplasm of unspecified part of unspecified bronchus or lung: Secondary | ICD-10-CM | POA: Diagnosis not present

## 2020-06-16 DIAGNOSIS — Z5112 Encounter for antineoplastic immunotherapy: Secondary | ICD-10-CM | POA: Diagnosis not present

## 2020-06-16 DIAGNOSIS — Z5189 Encounter for other specified aftercare: Secondary | ICD-10-CM | POA: Diagnosis not present

## 2020-06-16 DIAGNOSIS — D702 Other drug-induced agranulocytosis: Secondary | ICD-10-CM

## 2020-06-16 DIAGNOSIS — F329 Major depressive disorder, single episode, unspecified: Secondary | ICD-10-CM | POA: Diagnosis not present

## 2020-06-16 DIAGNOSIS — Z79899 Other long term (current) drug therapy: Secondary | ICD-10-CM | POA: Diagnosis not present

## 2020-06-16 DIAGNOSIS — C7931 Secondary malignant neoplasm of brain: Secondary | ICD-10-CM | POA: Diagnosis not present

## 2020-06-16 MED ORDER — FILGRASTIM-SNDZ 300 MCG/0.5ML IJ SOSY
PREFILLED_SYRINGE | INTRAMUSCULAR | Status: AC
  Filled 2020-06-16: qty 0.5

## 2020-06-16 MED ORDER — FILGRASTIM-SNDZ 300 MCG/0.5ML IJ SOSY
300.0000 ug | PREFILLED_SYRINGE | Freq: Once | INTRAMUSCULAR | Status: AC
  Administered 2020-06-16: 300 ug via SUBCUTANEOUS

## 2020-06-16 NOTE — Patient Instructions (Signed)

## 2020-06-20 NOTE — Progress Notes (Signed)
  Patient Name: Phillip Franklin MRN: [id number] DOB: Mar 01, 1941 Referring Physician: Curt Bears (Profile Not Attached) Date of Service: 05/04/2020 [facility], [place]                                                        End Of Treatment Note  Diagnoses: C79.31-Secondary malignant neoplasm of brain  Cancer Staging: STAGE IV  Intent: Palliative  Radiation Treatment Dates: 05/04/2020 through 05/04/2020 Site Technique Total Dose (Gy) Dose per Fx (Gy) Completed Fx Beam Energies  Brain: Brain_SRS IMRT 20/20 20 1/1 [id number]   Narrative: The patient tolerated radiation therapy relatively well.   Plan: The patient will follow-up with radiation oncology in 71mo. -----------------------------------  Eppie Gibson, MD

## 2020-06-22 ENCOUNTER — Inpatient Hospital Stay: Payer: Medicare PPO | Attending: Internal Medicine

## 2020-06-22 ENCOUNTER — Telehealth: Payer: Self-pay | Admitting: Medical Oncology

## 2020-06-22 ENCOUNTER — Other Ambulatory Visit: Payer: Self-pay

## 2020-06-22 ENCOUNTER — Inpatient Hospital Stay (HOSPITAL_BASED_OUTPATIENT_CLINIC_OR_DEPARTMENT_OTHER): Payer: Medicare PPO | Admitting: Medical

## 2020-06-22 VITALS — BP 136/60 | HR 73 | Temp 97.8°F | Resp 17 | Ht 69.0 in | Wt 148.4 lb

## 2020-06-22 DIAGNOSIS — L989 Disorder of the skin and subcutaneous tissue, unspecified: Secondary | ICD-10-CM

## 2020-06-22 DIAGNOSIS — C3492 Malignant neoplasm of unspecified part of left bronchus or lung: Secondary | ICD-10-CM | POA: Diagnosis not present

## 2020-06-22 DIAGNOSIS — F329 Major depressive disorder, single episode, unspecified: Secondary | ICD-10-CM | POA: Diagnosis not present

## 2020-06-22 DIAGNOSIS — C7931 Secondary malignant neoplasm of brain: Secondary | ICD-10-CM | POA: Diagnosis not present

## 2020-06-22 DIAGNOSIS — Z5112 Encounter for antineoplastic immunotherapy: Secondary | ICD-10-CM | POA: Insufficient documentation

## 2020-06-22 DIAGNOSIS — Z79899 Other long term (current) drug therapy: Secondary | ICD-10-CM | POA: Diagnosis not present

## 2020-06-22 DIAGNOSIS — C3490 Malignant neoplasm of unspecified part of unspecified bronchus or lung: Secondary | ICD-10-CM | POA: Insufficient documentation

## 2020-06-22 DIAGNOSIS — C7951 Secondary malignant neoplasm of bone: Secondary | ICD-10-CM | POA: Diagnosis not present

## 2020-06-22 DIAGNOSIS — Z87891 Personal history of nicotine dependence: Secondary | ICD-10-CM | POA: Diagnosis not present

## 2020-06-22 LAB — CBC WITH DIFFERENTIAL (CANCER CENTER ONLY)
Abs Immature Granulocytes: 0.02 10*3/uL (ref 0.00–0.07)
Basophils Absolute: 0 10*3/uL (ref 0.0–0.1)
Basophils Relative: 0 %
Eosinophils Absolute: 0 10*3/uL (ref 0.0–0.5)
Eosinophils Relative: 1 %
HCT: 34.8 % — ABNORMAL LOW (ref 39.0–52.0)
Hemoglobin: 11.3 g/dL — ABNORMAL LOW (ref 13.0–17.0)
Immature Granulocytes: 1 %
Lymphocytes Relative: 35 %
Lymphs Abs: 1.1 10*3/uL (ref 0.7–4.0)
MCH: 29.9 pg (ref 26.0–34.0)
MCHC: 32.5 g/dL (ref 30.0–36.0)
MCV: 92.1 fL (ref 80.0–100.0)
Monocytes Absolute: 0.8 10*3/uL (ref 0.1–1.0)
Monocytes Relative: 25 %
Neutro Abs: 1.1 10*3/uL — ABNORMAL LOW (ref 1.7–7.7)
Neutrophils Relative %: 38 %
Platelet Count: 50 10*3/uL — ABNORMAL LOW (ref 150–400)
RBC: 3.78 MIL/uL — ABNORMAL LOW (ref 4.22–5.81)
RDW: 18 % — ABNORMAL HIGH (ref 11.5–15.5)
WBC Count: 3 10*3/uL — ABNORMAL LOW (ref 4.0–10.5)
nRBC: 0 % (ref 0.0–0.2)

## 2020-06-22 LAB — CMP (CANCER CENTER ONLY)
ALT: 26 U/L (ref 0–44)
AST: 21 U/L (ref 15–41)
Albumin: 3.3 g/dL — ABNORMAL LOW (ref 3.5–5.0)
Alkaline Phosphatase: 165 U/L — ABNORMAL HIGH (ref 38–126)
Anion gap: 6 (ref 5–15)
BUN: <4 mg/dL — ABNORMAL LOW (ref 8–23)
CO2: 31 mmol/L (ref 22–32)
Calcium: 10.1 mg/dL (ref 8.9–10.3)
Chloride: 103 mmol/L (ref 98–111)
Creatinine: 1.01 mg/dL (ref 0.61–1.24)
GFR, Est AFR Am: >60 mL/min (ref >60)
GFR, Estimated: >60 mL/min (ref >60)
Glucose, Bld: 104 mg/dL — ABNORMAL HIGH (ref 70–99)
Potassium: 3.7 mmol/L (ref 3.5–5.1)
Sodium: 140 mmol/L (ref 135–145)
Total Bilirubin: 0.5 mg/dL (ref 0.3–1.2)
Total Protein: 7.3 g/dL (ref 6.5–8.1)

## 2020-06-22 MED ORDER — SULFAMETHOXAZOLE-TRIMETHOPRIM 800-160 MG PO TABS: 1.0000 | ORAL_TABLET | Freq: Two times a day (BID) | ORAL | 0 refills | Status: DC

## 2020-06-22 NOTE — Telephone Encounter (Addendum)
L arm inflammation- Wife reported pts L forearm redness, warm and tender and  Is spreading toward the wrist does not look any better after being on Amoxicillin for 72 hours ( Rx from [facility]). Schedule message sent for [facility] today.

## 2020-06-23 ENCOUNTER — Telehealth: Payer: Self-pay | Admitting: Medical Oncology

## 2020-06-23 NOTE — Progress Notes (Signed)
[facility] Progress Note   ELIAKIM Franklin [id number] 06-Sep-1941 79 y.o.  Phillip Franklin is managed by Dr. Fanny Bien. [name]  Actively treated with chemotherapy/immunotherapy/hormonal therapy: yes  Current therapy:  Carboplatin, Keytruda, and Alimta  Last treated: 06/08/2020 (cycle #4, day #1)  Next scheduled appointment with provider: 06/30/2020  Assessment: Plan:    Skin lesion of left arm - Plan: sulfamethoxazole-trimethoprim (BACTRIM DS) 800-160 MG tablet, Ambulatory referral to Dermatology  Adenocarcinoma of left lung, stage 4 ([place])  Brain metastases ([place])   Skin lesion of the left forearm: The patient been called in a prescription for amoxicillin by the on-call provider on 06/22/2020.  He was told to stop this medication.  He was placed on Bactrim DS p.o. twice daily x7 days.  Additionally a referral was made to dermatology requesting that the area on his left dorsal forearm be biopsied.  Metastatic adenocarcinoma of the lung with brain metastasis: The patient continues to be managed by Dr. Julien Nordmann and is status post cycle 4, day 1 of carboplatin, Keytruda, and Alimta.  [name] scheduled to return to be seen next on 06/30/2020.  Please see After Visit Summary for patient specific instructions.  Future Appointments  Date Time Provider [place]  06/30/2020  1:30 PM [facility] LAB [facility] None  06/30/2020  2:00 PM Heilingoetter, Cassandra L, PA-C [facility] None  06/30/2020  2:45 PM [facility] INFUSION [facility] None    Orders Placed This Encounter  Procedures  . Ambulatory referral to Dermatology       Subjective:   Patient ID:  Phillip Franklin is a 79 y.o. (DOB [birth year]) male.  Chief Complaint: No chief complaint on file.   HPI Phillip CANDEE  is a 79 y.o. male with a diagnosis of a metastatic adenocarcinoma of the lung with brain metastasis. [name] followed by by Dr. Julien Nordmann and is status post cycle 4, day 1 of carboplatin,  Keytruda, and Alimta.  He presents to the clinic today with his wife.  [name] being seen for a lesion that has appeared in his left dorsal forearm.  His wife reports that he has had similar lesions before.  He has had no use of this arm for IVs recently as he has had this lesion in place.  He denies trauma or changes in activity.  He denies fevers, chills, or sweats.  His wife request that he be referred to Dr. Harriett Sine who is her dermatologist.  Medications: I have reviewed the patient's current medications.  Allergies:  Allergies  Allergen Reactions  . Codeine Other (See Comments)    hallucinations  . Hydrocodone Other (See Comments)    hallucinations  . Oxycodone Other (See Comments)    hallucinations  . Penicillins Nausea And Vomiting    Past Medical History:  Diagnosis Date  . Depression   . GERD (gastroesophageal reflux disease)    occasional, diet controlled  . High cholesterol   . Hypertension    no longer taking medication - states it's undercontrol  . Hypothyroid     Past Surgical History:  Procedure Laterality Date  . BRONCHIAL NEEDLE ASPIRATION BIOPSY  03/24/2020   Procedure: BRONCHIAL NEEDLE ASPIRATION BIOPSIES;  Surgeon: Candee Furbish, MD;  Location: [facility] ENDOSCOPY;  Service: Pulmonary;;  . HEMORRHOID SURGERY    . VIDEO BRONCHOSCOPY WITH ENDOBRONCHIAL ULTRASOUND N/A 03/24/2020   Procedure: VIDEO BRONCHOSCOPY WITH ENDOBRONCHIAL ULTRASOUND;  Surgeon: Candee Furbish, MD;  Location: [facility] ENDOSCOPY;  Service: Pulmonary;  Laterality: N/A;  Family History  Problem Relation Age of Onset  . Heart disease Father     Social History   Socioeconomic History  . Marital status: Married    Spouse name: Not on file  . Number of children: Not on file  . Years of education: Not on file  . Highest education level: Not on file  Occupational History  . Not on file  Tobacco Use  . Smoking status: Former Smoker    Types: Cigarettes, Pipe    Quit date: 11/13/1977     Years since quitting: 42.6  . Smokeless tobacco: Never Used  Vaping Use  . Vaping Use: Never used  Substance and Sexual Activity  . Alcohol use: No  . Drug use: No  . Sexual activity: Not Currently  Other Topics Concern  . Not on file  Social History Narrative  . Not on file   Social Determinants of Health   Financial Resource Strain:   . Difficulty of Paying Living Expenses: Not on file  Food Insecurity:   . Worried About Charity fundraiser in the Last Year: Not on file  . Ran Out of Food in the Last Year: Not on file  Transportation Needs:   . Lack of Transportation (Medical): Not on file  . Lack of Transportation (Non-Medical): Not on file  Physical Activity:   . Days of Exercise per Week: Not on file  . Minutes of Exercise per Session: Not on file  Stress:   . Feeling of Stress : Not on file  Social Connections:   . Frequency of Communication with Friends and Family: Not on file  . Frequency of Social Gatherings with Friends and Family: Not on file  . Attends Religious Services: Not on file  . Active Member of Clubs or Organizations: Not on file  . Attends Archivist Meetings: Not on file  . Marital Status: Not on file  Intimate Partner Violence:   . Fear of Current or Ex-Partner: Not on file  . Emotionally Abused: Not on file  . Physically Abused: Not on file  . Sexually Abused: Not on file    Past Medical History, Surgical history, Social history, and Family history were reviewed and updated as appropriate.   Please see review of systems for further details on the patient's review from today.   Review of Systems:  Review of Systems  Constitutional: Negative for chills, diaphoresis and fever.  HENT: Negative for facial swelling and trouble swallowing.   Respiratory: Negative for cough, chest tightness and shortness of breath.   Cardiovascular: Negative for chest pain.  Skin: Positive for pallor and rash.    Objective:   Physical Exam:  BP  136/60 (BP Location: Left Arm, Patient Position: Sitting)   Pulse 73   Temp 97.8 F (36.6 C) (Tympanic)   Resp 17   Ht 5\' 9"  (1.753 m)   Wt 148 lb 6.4 oz (67.3 kg)   SpO2 100%   BMI 21.91 kg/m  ECOG: 0  Physical Exam Constitutional:      General: [name] not in acute distress.    Appearance: [name] not diaphoretic.  HENT:     Head: Normocephalic and atraumatic.  Eyes:     General: No scleral icterus.       Right eye: No discharge.        Left eye: No discharge.     Conjunctiva/sclera: Conjunctivae normal.  Cardiovascular:     Rate and Rhythm: Normal rate and regular rhythm.  Heart sounds: Normal heart sounds. No murmur heard.  No friction rub. No gallop.   Pulmonary:     Effort: Pulmonary effort is normal. No respiratory distress.     Breath sounds: Normal breath sounds. No stridor. No wheezing or rales.  Musculoskeletal:        General: No deformity.  Skin:    General: Skin is warm and dry.     Findings: Lesion present. No erythema or rash.  Neurological:     Mental Status: [name] alert.     Coordination: Coordination normal.     Gait: Gait abnormal (The patient is ambulating with the use of a wheelchair.).       Lab Review:     Component Value Date/Time   NA 140 06/22/2020 1236   K 3.7 06/22/2020 1236   CL 103 06/22/2020 1236   CO2 31 06/22/2020 1236   GLUCOSE 104 (H) 06/22/2020 1236   BUN <4 (L) 06/22/2020 1236   CREATININE 1.01 06/22/2020 1236   CALCIUM 10.1 06/22/2020 1236   PROT 7.3 06/22/2020 1236   ALBUMIN 3.3 (L) 06/22/2020 1236   AST 21 06/22/2020 1236   ALT 26 06/22/2020 1236   ALKPHOS 165 (H) 06/22/2020 1236   BILITOT 0.5 06/22/2020 1236   GFRNONAA >60 06/22/2020 1236   GFRAA >60 06/22/2020 1236       Component Value Date/Time   WBC 3.0 (L) 06/22/2020 1236   WBC 5.5 04/21/2020 0906   RBC 3.78 (L) 06/22/2020 1236   HGB 11.3 (L) 06/22/2020 1236   HCT 34.8 (L) 06/22/2020 1236   PLT 50 (L) 06/22/2020 1236   MCV 92.1 06/22/2020 1236    MCH 29.9 06/22/2020 1236   MCHC 32.5 06/22/2020 1236   RDW 18.0 (H) 06/22/2020 1236   LYMPHSABS 1.1 06/22/2020 1236   MONOABS 0.8 06/22/2020 1236   EOSABS 0.0 06/22/2020 1236   BASOSABS 0.0 06/22/2020 1236   -------------------------------  Imaging from last 24 hours (if applicable):  Radiology interpretation: CT CHEST ABDOMEN PELVIS W CONTRAST  Result Date: 06/04/2020 CLINICAL DATA:  Lung cancer restaging EXAM: CT CHEST, ABDOMEN, AND PELVIS WITH CONTRAST TECHNIQUE: Multidetector CT imaging of the chest, abdomen and pelvis was performed following the standard protocol during bolus administration of intravenous contrast. CONTRAST:  160mL OMNIPAQUE IOHEXOL 300 MG/ML SOLN, additional oral enteric contrast COMPARISON:  PET-CT, 03/20/2020, CT chest, 04/04/2020 FINDINGS: CT CHEST FINDINGS Cardiovascular: Aortic atherosclerosis. Normal heart size. Three-vessel coronary artery calcifications. No pericardial effusion. Mediastinum/Nodes: Slight interval decrease in size in subcarinal lymph node or conglomerate measuring 3.3 x 1.8 cm, previously 4.1 x 2.5 cm (series 2, image 24). Additional left hilar, pretracheal, AP window, and superior mediastinal lymph nodes are unchanged. Thyroid gland, trachea, and esophagus demonstrate no significant findings. Lungs/Pleura: Interval decrease in size of a primary mass of the left pulmonary apex, measuring 3.7 x 3.5 cm, previously 4.7 x 4.5 cm when measured similarly (series 2, image 8). There are innumerable bilateral pulmonary nodules, generally spiculated in morphology, the majority not significantly changed compared to prior examination, an index nodule of the right upper lobe measuring 1.2 x 0.8 cm (series 4, image 36). Some nodules however are slightly decreased in size, for example a nodule of the medial costophrenic recess, measuring 1.3 x 0.8 cm, previously 1.7 x 1.1 cm (series 4, image 110). A previously seen subpleural, rounded opacity of the lateral segment  right middle lobe with central clearing is significantly reduced in size, measuring 1.4 x 1.3 cm, previously 2.9 x  2.3 cm (series 4, image 67). No pleural effusion or pneumothorax. Musculoskeletal: No chest wall mass. No significant change in osseous metastatic lesions, a lytic lesion of the inferior endplate of T8 (series 6, image 96). CT ABDOMEN PELVIS FINDINGS Hepatobiliary: No solid liver abnormality is seen. No gallstones, gallbladder wall thickening, or biliary dilatation. Pancreas: Unremarkable. No pancreatic ductal dilatation or surrounding inflammatory changes. Spleen: Normal in size without significant abnormality. Adrenals/Urinary Tract: Adrenal glands are unremarkable. Kidneys are normal, without renal calculi, solid lesion, or hydronephrosis. Bladder is unremarkable. Stomach/Bowel: Stomach is within normal limits. Appendix appears normal. No evidence of bowel wall thickening, distention, or inflammatory changes. Vascular/Lymphatic: No significant vascular findings are present. No enlarged abdominal or pelvic lymph nodes. Reproductive: Prostatomegaly and median lobe hypertrophy. Other: No abdominal wall hernia or abnormality. No abdominopelvic ascites. Musculoskeletal: New sclerotic osseous lesion of the L1 vertebral body. IMPRESSION: 1. Interval decrease in size of a primary mass of the left pulmonary apex, consistent with treatment response. 2. There are innumerable bilateral pulmonary nodules, generally spiculated in morphology, the majority not significantly changed compared to prior examination, however some nodules are slightly decreased in size. Findings are consistent with stable to slightly improved pulmonary metastatic disease. 3. Slight interval decrease in size in subcarinal lymph node or conglomerate. Additional left hilar, pretracheal, AP window, and superior mediastinal lymph nodes are unchanged, consistent with stable to slightly improved nodal metastatic disease. 4. No significant change  in a lytic lesion of the inferior endplate of T8. New post treatment sclerosis of a lesion of the L1 vertebral body. Note that the great majority of widespread FDG avid osseous metastatic disease seen on prior PET examination is not appreciated by CT. 5. Aortic Atherosclerosis (ICD10-I70.0). Electronically Signed   By: Eddie Candle M.D.   On: 06/04/2020 22:00        This case was discussed with Dr. Julien Nordmann. He expressed agreement with my management of this patient.

## 2020-06-23 NOTE — Telephone Encounter (Signed)
   L forearm better per [name]. "Should he keep taking antibiotic?"  I lvm on Graces's phone for pt to take all the  antibiotic doses.

## 2020-06-24 ENCOUNTER — Other Ambulatory Visit: Payer: Self-pay | Admitting: Medical

## 2020-06-24 ENCOUNTER — Telehealth: Payer: Self-pay | Admitting: Emergency Medicine

## 2020-06-24 DIAGNOSIS — L989 Disorder of the skin and subcutaneous tissue, unspecified: Secondary | ICD-10-CM

## 2020-06-24 MED ORDER — SULFAMETHOXAZOLE-TRIMETHOPRIM 800-160 MG PO TABS: 1.0000 | ORAL_TABLET | Freq: Two times a day (BID) | ORAL | 0 refills | Status: DC

## 2020-06-24 NOTE — Telephone Encounter (Signed)
Spoke with pt's wife Phillip Franklin regarding antibiotic prescription that was not received by [employer].  Resent prescription and contacted pharmacy to confirm receipt.  Phillip Franklin made aware of prescription being re-sent.  PA Lucianne Lei also sent urgent referral to pt's recommended dermatologist (Dr. Elvera Lennox) to have pt seen soon for skin concerns.  Phillip Franklin is aware to call back if pt's skin changes/worsens or if there are any further issues with the antibiotic or if they cannot get a dermatology appt scheduled in a timely manner.  She denies any further questions or concerns at this time.

## 2020-06-26 NOTE — Progress Notes (Signed)
[facility] OFFICE PROGRESS NOTE  Seward Carol, MD [place] [address] [place] [address]  DIAGNOSIS: Stage IV (T4, N3, M1c) non-small cell lung cancer, adenocarcinoma presented with left upper lobe lung mass in addition to bilateral mediastinal lymphadenopathy as well as bilateral pulmonary nodules and metastatic disease to the bones and brain diagnosed in July 2021.  Molecular studies by [facility]showed no actionable mutations.  PRIOR THERAPY: SRS to the metastatic brain lesion under the care of Dr. Isidore Moos on 05/04/20  CURRENT THERAPY: Palliative systemic chemotherapy with carboplatin for an AUC of 5, Alimta 500 mg per metered squared, Keytruda 200 mg IV every 3 weeks. Status post 4 cycles. First dose 04/06/20. Starting from cycle # 5, the patient will be on maintenance Alimta and Keytruda.   INTERVAL HISTORY: Phillip Franklin 79 y.o. male returns to the clinic today for a follow-up visit.  The patient is feeling fair today without any concerning complaints except for occasional joint aches, BPH, and further advise on the left forearm lesion. In the nterval since his last appointment the patient has a lesion on the left dorsal aspect of his forearm.  He denies any traumas or injuries to this arm.  The patient denies any recent IVs as he has a Port-A-Cath.  The patient received a prescription for amoxicillin by the on-call physician. He completed this prescription.  He was seen in the symptom management clinic on 06/22/2020 and given a prescription for Bactrim.  Additionally he was referred to dermatology for consideration of a biopsy.  Dermatologist, Dr. Elvera Lennox, did not recommend a biopsy at this time. The lesion is improved. The erythema has improved. The patient did not start the prescription for the bactrim at this time. He denies fevers, chills, or night sweats.   The patient has a history of BPH and is on flomax. This is managed by his PCP. He continues to have  difficulty urinating at night. They also mention that the patient is requiring an identification card and they are concerned about him getting a license with some forgetfulness. They are inquiring if a baseline cognitive test can be performed. They are also interested in the COVID and flu vaccine and wondering if this can be administered in the clinic.   Otherwise the patient continues to take Celexa for his depression.  Regarding his appetite change and weight loss, he is followed by a member of the nutritionist team.The patient does not like the supplemental drinks such as Ensure and Carnation breakfast essentials due to them tasting too sweet.  He denies any chest pain or hemoptysis. He reports some dyspnea based on what activity he is doing. He reports his similar baseline cough. He denies any nausea, vomiting, or diarrhea. He has some constipation for which he will take miralax or prunes if needed. He denies any headache or visual changes.  [name] here today for evaluation before starting cycle #5  N MEDICAL HISTORY: Past Medical History:  Diagnosis Date  . Depression   . GERD (gastroesophageal reflux disease)    occasional, diet controlled  . High cholesterol   . Hypertension    no longer taking medication - states it's undercontrol  . Hypothyroid     ALLERGIES:  is allergic to codeine, hydrocodone, oxycodone, and penicillins.  MEDICATIONS:  Current Outpatient Medications  Medication Sig Dispense Refill  . acetaminophen (TYLENOL) 325 MG tablet Take 325-650 mg by mouth every 6 (six) hours as needed for moderate pain or headache.    Marland Kitchen  amoxicillin-clavulanate (AUGMENTIN) 875-125 MG tablet Take 1 tablet by mouth 2 (two) times daily.    Marland Kitchen atorvastatin (LIPITOR) 20 MG tablet Take 20 mg by mouth daily.    . benzonatate (TESSALON) 200 MG capsule Take 1 capsule (200 mg total) by mouth 4 (four) times daily as needed for cough. 60 capsule 1  . citalopram (CELEXA) 20 MG tablet Take 1 tablet (20 mg  total) by mouth daily. 30 tablet 3  . dronabinol (MARINOL) 2.5 MG capsule Take 1 capsule (2.5 mg total) by mouth 2 (two) times daily before a meal. (Patient taking differently: Take 2.5 mg by mouth 2 (two) times daily before a meal. Patient is only taking once a day) 60 capsule 2  . esomeprazole (NEXIUM) 20 MG capsule Take 20 mg by mouth daily as needed (acid reflux).     . folic acid (FOLVITE) 1 MG tablet Take 1 tablet (1 mg total) by mouth daily. 30 tablet 4  . levothyroxine (SYNTHROID) 88 MCG tablet Take 88 mcg by mouth daily before breakfast.    . LORazepam (ATIVAN) 0.5 MG tablet Take 1-2 tablets by mouth 30 minutes before procedures, PRN anxiety. 8 tablet 0  . Multiple Vitamins-Minerals (CENTRUM SILVER 50+MEN) TABS Take 1 tablet by mouth daily.    Marland Kitchen neomycin-polymyxin b-dexamethasone (MAXITROL) 3.5-10000-0.1 SUSP Place 1 drop into the right eye every 8 (eight) hours.    . prochlorperazine (COMPAZINE) 10 MG tablet Take 1 tablet (10 mg total) by mouth every 6 (six) hours as needed for nausea or vomiting. 30 tablet 0  . sulfamethoxazole-trimethoprim (BACTRIM DS) 800-160 MG tablet Take 1 tablet by mouth 2 (two) times daily. 14 tablet 0  . tamsulosin (FLOMAX) 0.4 MG CAPS capsule Take 0.4 mg by mouth every evening.     . traMADol (ULTRAM) 50 MG tablet Take 1 tablet (50 mg total) by mouth every 6 (six) hours as needed. 30 tablet 0   No current facility-administered medications for this visit.    SURGICAL HISTORY:  Past Surgical History:  Procedure Laterality Date  . BRONCHIAL NEEDLE ASPIRATION BIOPSY  03/24/2020   Procedure: BRONCHIAL NEEDLE ASPIRATION BIOPSIES;  Surgeon: Candee Furbish, MD;  Location: [facility] ENDOSCOPY;  Service: Pulmonary;;  . HEMORRHOID SURGERY    . VIDEO BRONCHOSCOPY WITH ENDOBRONCHIAL ULTRASOUND N/A 03/24/2020   Procedure: VIDEO BRONCHOSCOPY WITH ENDOBRONCHIAL ULTRASOUND;  Surgeon: Candee Furbish, MD;  Location: [facility] ENDOSCOPY;  Service: Pulmonary;  Laterality: N/A;    REVIEW OF  SYSTEMS:   Review of Systems  Constitutional:Positive for fatigue and decreased appetite.Negative for chills and fever. HENT: Negative for mouth sores, nosebleeds, sore throat and trouble swallowing.  Eyes: Negative for eye problems and icterus.  Respiratory: Positive for dyspnea on exertion. Negative for cough, hemoptysis, and wheezing.  Cardiovascular: Negative for chest pain and leg swelling.  Gastrointestinal:  Negative for abdominal pain, diarrhea, nauseaand vomiting.  Genitourinary: Positive for difficulty urinating. Negative for bladder incontinence, dysuria, frequency and hematuria.  Musculoskeletal: Negative for back pain, gait problem, neck pain and neck stiffness.  Skin: Positive for skin lesion on left forearm.  Neurological: Negative for dizziness, extremity weakness, gait problem, headaches, light-headedness and seizures.  Hematological: Negative for adenopathy. Does not bruise/bleed easily.  Psychiatric/Behavioral:Positive for depression.Negative for confusion, and sleep disturbance. The patient is not nervous/anxious.      PHYSICAL EXAMINATION:  Blood pressure (!) 119/58, pulse 68, temperature 98.5 F (36.9 C), resp. rate 12, height 5\' 9"  (1.753 m), weight 147 lb (66.7 kg), SpO2 100 %.  ECOG  PERFORMANCE STATUS: 2 - Symptomatic, <50% confined to bed  Physical Exam  Constitutional: Oriented to person, place, and time and well-developed, well-nourished, and in no distress.  HENT:  Head: Normocephalic and atraumatic.  Mouth/Throat: Oropharynx is clear and moist. No oropharyngeal exudate.  Eyes: Conjunctivae are normal. Right eye exhibits no discharge. Left eye exhibits no discharge. No scleral icterus.  Neck: Normal range of motion. Neck supple.  Cardiovascular: Normal rate, regular rhythm, normal heart sounds and intact distal pulses.  Pulmonary/Chest: Effort normal and breath sounds normal. No respiratory distress. No wheezes. No rales.  Abdominal: Soft.  Bowel sounds are normal. Exhibits no distension and no mass. There is no tenderness.  Musculoskeletal: Normal range of motion. Exhibits no edema.  Lymphadenopathy:  No cervical adenopathy.  Neurological: Alert and oriented to person, place, and time. Exhibits normal muscle tone. Examined in the wheelchair.  Skin:  Improved knot in the left forearm. Erythema improved. Firm lesion. No significant tenderness to palpation.  Psychiatric: Depressed mood,memory and judgment normal.  Vitals reviewed.  LABORATORY DATA: Lab Results  Component Value Date   WBC 7.3 06/30/2020   HGB 11.1 (L) 06/30/2020   HCT 33.9 (L) 06/30/2020   MCV 90.6 06/30/2020   PLT 244 06/30/2020      Chemistry      Component Value Date/Time   NA 139 06/30/2020 1339   K 3.7 06/30/2020 1339   CL 106 06/30/2020 1339   CO2 30 06/30/2020 1339   BUN 5 (L) 06/30/2020 1339   CREATININE 0.91 06/30/2020 1339      Component Value Date/Time   CALCIUM 9.9 06/30/2020 1339   ALKPHOS 139 (H) 06/30/2020 1339   AST 21 06/30/2020 1339   ALT 17 06/30/2020 1339   BILITOT 0.4 06/30/2020 1339       RADIOGRAPHIC STUDIES:  CT CHEST ABDOMEN PELVIS W CONTRAST  Result Date: 06/04/2020 CLINICAL DATA:  Lung cancer restaging EXAM: CT CHEST, ABDOMEN, AND PELVIS WITH CONTRAST TECHNIQUE: Multidetector CT imaging of the chest, abdomen and pelvis was performed following the standard protocol during bolus administration of intravenous contrast. CONTRAST:  12mL OMNIPAQUE IOHEXOL 300 MG/ML SOLN, additional oral enteric contrast COMPARISON:  PET-CT, 03/20/2020, CT chest, 04/04/2020 FINDINGS: CT CHEST FINDINGS Cardiovascular: Aortic atherosclerosis. Normal heart size. Three-vessel coronary artery calcifications. No pericardial effusion. Mediastinum/Nodes: Slight interval decrease in size in subcarinal lymph node or conglomerate measuring 3.3 x 1.8 cm, previously 4.1 x 2.5 cm (series 2, image 24). Additional left hilar, pretracheal, AP window, and  superior mediastinal lymph nodes are unchanged. Thyroid gland, trachea, and esophagus demonstrate no significant findings. Lungs/Pleura: Interval decrease in size of a primary mass of the left pulmonary apex, measuring 3.7 x 3.5 cm, previously 4.7 x 4.5 cm when measured similarly (series 2, image 8). There are innumerable bilateral pulmonary nodules, generally spiculated in morphology, the majority not significantly changed compared to prior examination, an index nodule of the right upper lobe measuring 1.2 x 0.8 cm (series 4, image 36). Some nodules however are slightly decreased in size, for example a nodule of the medial costophrenic recess, measuring 1.3 x 0.8 cm, previously 1.7 x 1.1 cm (series 4, image 110). A previously seen subpleural, rounded opacity of the lateral segment right middle lobe with central clearing is significantly reduced in size, measuring 1.4 x 1.3 cm, previously 2.9 x 2.3 cm (series 4, image 67). No pleural effusion or pneumothorax. Musculoskeletal: No chest wall mass. No significant change in osseous metastatic lesions, a lytic lesion of the inferior endplate  of T8 (series 6, image 96). CT ABDOMEN PELVIS FINDINGS Hepatobiliary: No solid liver abnormality is seen. No gallstones, gallbladder wall thickening, or biliary dilatation. Pancreas: Unremarkable. No pancreatic ductal dilatation or surrounding inflammatory changes. Spleen: Normal in size without significant abnormality. Adrenals/Urinary Tract: Adrenal glands are unremarkable. Kidneys are normal, without renal calculi, solid lesion, or hydronephrosis. Bladder is unremarkable. Stomach/Bowel: Stomach is within normal limits. Appendix appears normal. No evidence of bowel wall thickening, distention, or inflammatory changes. Vascular/Lymphatic: No significant vascular findings are present. No enlarged abdominal or pelvic lymph nodes. Reproductive: Prostatomegaly and median lobe hypertrophy. Other: No abdominal wall hernia or abnormality.  No abdominopelvic ascites. Musculoskeletal: New sclerotic osseous lesion of the L1 vertebral body. IMPRESSION: 1. Interval decrease in size of a primary mass of the left pulmonary apex, consistent with treatment response. 2. There are innumerable bilateral pulmonary nodules, generally spiculated in morphology, the majority not significantly changed compared to prior examination, however some nodules are slightly decreased in size. Findings are consistent with stable to slightly improved pulmonary metastatic disease. 3. Slight interval decrease in size in subcarinal lymph node or conglomerate. Additional left hilar, pretracheal, AP window, and superior mediastinal lymph nodes are unchanged, consistent with stable to slightly improved nodal metastatic disease. 4. No significant change in a lytic lesion of the inferior endplate of T8. New post treatment sclerosis of a lesion of the L1 vertebral body. Note that the great majority of widespread FDG avid osseous metastatic disease seen on prior PET examination is not appreciated by CT. 5. Aortic Atherosclerosis (ICD10-I70.0). Electronically Signed   By: Eddie Candle M.D.   On: 06/04/2020 22:00     ASSESSMENT/PLAN:  This is a very pleasant 79 year old male recently diagnosed with stage IV (T4, N3, M1C) non-small cell lung cancer, adenocarcinoma.  He presented with a left upper lobe lung mass in addition to bilateral pulmonary nodules as well as bilateral hilar and mediastinal lymphadenopathy.  He also has metastatic disease to the bones and brain.  He was diagnosed in July 2021.  His molecular studies performed by [employer] was negative for any actionable mutations. The initial tissue biopsy was insufficient for molecular studies by [facility]. The patient had repeat biopsy for the molecular studies which were negative for any actionable mutations.   He completed SRS to the brain lesion under the care of Dr. Isidore Moos. This was completed on 05/04/20.  [name]  currently undergoing palliative systemic chemotherapy with carboplatin for an AUC of 5, Alimta 500 mg per metered squared, Keytruda 200 mg IV every 3 weeks.  [name] status post 4 cycles.  He tolerated his treatment fairly well except for fatigue, weight loss, and generalized weakness.  Starting from today, cycle #5, the patient will begin maintenance Keytruda and Alimta.  Labs were reviewed.  Recommend that he proceed with cycle #5 today scheduled.  We will see him back for follow-up visit in 3 weeks for evaluation before starting cycle #6.  The patient was referred to dermatology for his left forearm skin lesion, which is improving. The erythema has improved and swelling has improved but there still is a firm knot under the skin. The patient's dermatologist did not want to proceed with a biopsy. I will discuss this with Dr. Julien Nordmann when he returns to the clinic tomorrow for further recommendations and management. The patient has not taken the prescription for bactrim at this time.   The patient's wife inquired about a baseline cognitive evaluation to be performed. She mentioned that they  were discussing for the patient to get a license. I recommend that the patient follow with Dr. Delfina Redwood, his PCP regarding mental status exams. Additionally, the patient has a history of BPH and he is on flomax. I recommended that they follow up with Dr. Delfina Redwood regarding this as well.   We will arrange for the patient to receive the flu vaccine at his next visit. They ran out of 65+ vaccines today. I will arrange for the patient to receive the COVID 19 booster vaccine in 4-5 weeks during his off week of treatment.   He will continue on celexa for depression.   The patient was advised to call immediately if he has any concerning symptoms in the interval. The patient voices understanding of current disease status and treatment options and is in agreement with the current care plan. All questions were answered. The  patient knows to call the clinic with any problems, questions or concerns. We can certainly see the patient much sooner if necessary          Orders Placed This Encounter  Procedures  . TSH    Standing Status:   Standing    Number of Occurrences:   18    Standing Expiration Date:   06/30/2021  . CMP ([facility] only)    Standing Status:   Standing    Number of Occurrences:   18    Standing Expiration Date:   06/30/2021  . CBC with Differential ([facility] Only)    Standing Status:   Standing    Number of Occurrences:   18    Standing Expiration Date:   06/30/2021     Toccara Alford L Alysa Duca, PA-C 06/30/20

## 2020-06-30 ENCOUNTER — Inpatient Hospital Stay: Payer: Medicare PPO

## 2020-06-30 ENCOUNTER — Ambulatory Visit: Payer: Medicare PPO | Admitting: Internal Medicine

## 2020-06-30 ENCOUNTER — Ambulatory Visit: Payer: Medicare PPO

## 2020-06-30 ENCOUNTER — Other Ambulatory Visit: Payer: Medicare PPO

## 2020-06-30 ENCOUNTER — Inpatient Hospital Stay (HOSPITAL_BASED_OUTPATIENT_CLINIC_OR_DEPARTMENT_OTHER): Payer: Medicare PPO | Admitting: Physician Assistant

## 2020-06-30 ENCOUNTER — Other Ambulatory Visit: Payer: Self-pay

## 2020-06-30 ENCOUNTER — Encounter: Payer: Self-pay | Admitting: Physician Assistant

## 2020-06-30 VITALS — BP 119/58 | HR 68 | Temp 98.5°F | Resp 12 | Ht 69.0 in | Wt 147.0 lb

## 2020-06-30 DIAGNOSIS — Z87891 Personal history of nicotine dependence: Secondary | ICD-10-CM | POA: Diagnosis not present

## 2020-06-30 DIAGNOSIS — C3490 Malignant neoplasm of unspecified part of unspecified bronchus or lung: Secondary | ICD-10-CM | POA: Diagnosis not present

## 2020-06-30 DIAGNOSIS — F329 Major depressive disorder, single episode, unspecified: Secondary | ICD-10-CM | POA: Diagnosis not present

## 2020-06-30 DIAGNOSIS — Z5111 Encounter for antineoplastic chemotherapy: Secondary | ICD-10-CM

## 2020-06-30 DIAGNOSIS — C7931 Secondary malignant neoplasm of brain: Secondary | ICD-10-CM | POA: Diagnosis not present

## 2020-06-30 DIAGNOSIS — C7951 Secondary malignant neoplasm of bone: Secondary | ICD-10-CM | POA: Diagnosis not present

## 2020-06-30 DIAGNOSIS — Z23 Encounter for immunization: Secondary | ICD-10-CM

## 2020-06-30 DIAGNOSIS — C3492 Malignant neoplasm of unspecified part of left bronchus or lung: Secondary | ICD-10-CM | POA: Diagnosis not present

## 2020-06-30 DIAGNOSIS — Z79899 Other long term (current) drug therapy: Secondary | ICD-10-CM | POA: Diagnosis not present

## 2020-06-30 DIAGNOSIS — Z5112 Encounter for antineoplastic immunotherapy: Secondary | ICD-10-CM | POA: Diagnosis not present

## 2020-06-30 LAB — CMP (CANCER CENTER ONLY)
ALT: 17 U/L (ref 0–44)
AST: 21 U/L (ref 15–41)
Albumin: 3.2 g/dL — ABNORMAL LOW (ref 3.5–5.0)
Alkaline Phosphatase: 139 U/L — ABNORMAL HIGH (ref 38–126)
Anion gap: 3 — ABNORMAL LOW (ref 5–15)
BUN: 5 mg/dL — ABNORMAL LOW (ref 8–23)
CO2: 30 mmol/L (ref 22–32)
Calcium: 9.9 mg/dL (ref 8.9–10.3)
Chloride: 106 mmol/L (ref 98–111)
Creatinine: 0.91 mg/dL (ref 0.61–1.24)
GFR, Estimated: >60 mL/min (ref >60)
Glucose, Bld: 86 mg/dL (ref 70–99)
Potassium: 3.7 mmol/L (ref 3.5–5.1)
Sodium: 139 mmol/L (ref 135–145)
Total Bilirubin: 0.4 mg/dL (ref 0.3–1.2)
Total Protein: 7.1 g/dL (ref 6.5–8.1)

## 2020-06-30 LAB — CBC WITH DIFFERENTIAL (CANCER CENTER ONLY)
Abs Immature Granulocytes: 0.03 10*3/uL (ref 0.00–0.07)
Basophils Absolute: 0 10*3/uL (ref 0.0–0.1)
Basophils Relative: 1 %
Eosinophils Absolute: 0.1 10*3/uL (ref 0.0–0.5)
Eosinophils Relative: 1 %
HCT: 33.9 % — ABNORMAL LOW (ref 39.0–52.0)
Hemoglobin: 11.1 g/dL — ABNORMAL LOW (ref 13.0–17.0)
Immature Granulocytes: 0 %
Lymphocytes Relative: 19 %
Lymphs Abs: 1.4 10*3/uL (ref 0.7–4.0)
MCH: 29.7 pg (ref 26.0–34.0)
MCHC: 32.7 g/dL (ref 30.0–36.0)
MCV: 90.6 fL (ref 80.0–100.0)
Monocytes Absolute: 1 10*3/uL (ref 0.1–1.0)
Monocytes Relative: 14 %
Neutro Abs: 4.8 10*3/uL (ref 1.7–7.7)
Neutrophils Relative %: 65 %
Platelet Count: 244 10*3/uL (ref 150–400)
RBC: 3.74 MIL/uL — ABNORMAL LOW (ref 4.22–5.81)
RDW: 18.5 % — ABNORMAL HIGH (ref 11.5–15.5)
WBC Count: 7.3 10*3/uL (ref 4.0–10.5)
nRBC: 0 % (ref 0.0–0.2)

## 2020-06-30 MED ORDER — SODIUM CHLORIDE 0.9 % IV SOLN
200.0000 mg | Freq: Once | INTRAVENOUS | Status: AC
  Administered 2020-06-30: 200 mg via INTRAVENOUS
  Filled 2020-06-30: qty 8

## 2020-06-30 MED ORDER — SODIUM CHLORIDE 0.9 % IV SOLN
Freq: Once | INTRAVENOUS | Status: AC
  Filled 2020-06-30: qty 250

## 2020-06-30 MED ORDER — INFLUENZA VAC A&B SA ADJ QUAD 0.5 ML IM PRSY: 0.5000 mL | PREFILLED_SYRINGE | Freq: Once | INTRAMUSCULAR | Status: DC

## 2020-06-30 MED ORDER — PROCHLORPERAZINE MALEATE 10 MG PO TABS
ORAL_TABLET | ORAL | Status: AC
  Filled 2020-06-30: qty 1

## 2020-06-30 MED ORDER — PROCHLORPERAZINE MALEATE 10 MG PO TABS
10.0000 mg | ORAL_TABLET | Freq: Once | ORAL | Status: AC
  Administered 2020-06-30: 10 mg via ORAL

## 2020-06-30 MED ORDER — SODIUM CHLORIDE 0.9 % IV SOLN
500.0000 mg/m2 | Freq: Once | INTRAVENOUS | Status: AC
  Administered 2020-06-30: 900 mg via INTRAVENOUS
  Filled 2020-06-30: qty 20

## 2020-06-30 NOTE — Patient Instructions (Signed)
[facility] Discharge Instructions for Patients Receiving Chemotherapy  Today you received the following chemotherapy agents: Keytruda, Alimta  To help prevent nausea and vomiting after your treatment, we encourage you to take your nausea medication as directed.    If you develop nausea and vomiting that is not controlled by your nausea medication, call the clinic.   BELOW ARE SYMPTOMS THAT SHOULD BE REPORTED IMMEDIATELY:  *FEVER GREATER THAN 100.5 F  *CHILLS WITH OR WITHOUT FEVER  NAUSEA AND VOMITING THAT IS NOT CONTROLLED WITH YOUR NAUSEA MEDICATION  *UNUSUAL SHORTNESS OF BREATH  *UNUSUAL BRUISING OR BLEEDING  TENDERNESS IN MOUTH AND THROAT WITH OR WITHOUT PRESENCE OF ULCERS  *URINARY PROBLEMS  *BOWEL PROBLEMS  UNUSUAL RASH Items with * indicate a potential emergency and should be followed up as soon as possible.  Feel free to call the clinic should you have any questions or concerns. The clinic phone number is [phone].  Please show the [facility] at check-in to the Emergency Department and triage nurse.

## 2020-07-01 ENCOUNTER — Other Ambulatory Visit: Payer: Self-pay | Admitting: Radiation Therapy

## 2020-07-01 DIAGNOSIS — C7949 Secondary malignant neoplasm of other parts of nervous system: Secondary | ICD-10-CM

## 2020-07-01 DIAGNOSIS — C7931 Secondary malignant neoplasm of brain: Secondary | ICD-10-CM

## 2020-07-01 NOTE — Addendum Note (Signed)
Addended by: Pincus Large on: 07/01/2020 09:13 AM   Modules accepted: Orders

## 2020-07-02 DIAGNOSIS — C3492 Malignant neoplasm of unspecified part of left bronchus or lung: Secondary | ICD-10-CM | POA: Diagnosis not present

## 2020-07-02 DIAGNOSIS — R350 Frequency of micturition: Secondary | ICD-10-CM | POA: Diagnosis not present

## 2020-07-02 DIAGNOSIS — R413 Other amnesia: Secondary | ICD-10-CM | POA: Diagnosis not present

## 2020-07-02 DIAGNOSIS — E039 Hypothyroidism, unspecified: Secondary | ICD-10-CM | POA: Diagnosis not present

## 2020-07-07 DIAGNOSIS — R3982 Chronic bladder pain: Secondary | ICD-10-CM | POA: Diagnosis not present

## 2020-07-07 DIAGNOSIS — N400 Enlarged prostate without lower urinary tract symptoms: Secondary | ICD-10-CM | POA: Diagnosis not present

## 2020-07-07 DIAGNOSIS — N281 Cyst of kidney, acquired: Secondary | ICD-10-CM | POA: Diagnosis not present

## 2020-07-07 DIAGNOSIS — N401 Enlarged prostate with lower urinary tract symptoms: Secondary | ICD-10-CM | POA: Diagnosis not present

## 2020-07-07 DIAGNOSIS — R338 Other retention of urine: Secondary | ICD-10-CM | POA: Diagnosis not present

## 2020-07-07 DIAGNOSIS — K8020 Calculus of gallbladder without cholecystitis without obstruction: Secondary | ICD-10-CM | POA: Diagnosis not present

## 2020-07-07 DIAGNOSIS — I700 Atherosclerosis of aorta: Secondary | ICD-10-CM | POA: Diagnosis not present

## 2020-07-07 DIAGNOSIS — R1033 Periumbilical pain: Secondary | ICD-10-CM | POA: Diagnosis not present

## 2020-07-07 DIAGNOSIS — R350 Frequency of micturition: Secondary | ICD-10-CM | POA: Diagnosis not present

## 2020-07-08 ENCOUNTER — Telehealth: Payer: Self-pay | Admitting: Medical Oncology

## 2020-07-08 NOTE — Telephone Encounter (Signed)
Urology consult completed. Wife was told  the inflammation in the prostate may be related to Immunotherapy".   Dilaudid last dose 4 pm yesterday. It helped him sleep but he had " gory nightmares" after taking dilaudid  -he refused to take anymore. She did not get the CT results from Dr Jeffie Pollock so I instructed her to call him with hydromorphone adverse reaction and request CT results.

## 2020-07-21 ENCOUNTER — Inpatient Hospital Stay: Payer: Medicare PPO

## 2020-07-21 ENCOUNTER — Inpatient Hospital Stay: Payer: Medicare PPO | Attending: Internal Medicine

## 2020-07-21 ENCOUNTER — Other Ambulatory Visit: Payer: Self-pay

## 2020-07-21 VITALS — BP 122/62 | HR 62 | Temp 98.2°F | Resp 17

## 2020-07-21 DIAGNOSIS — Z79899 Other long term (current) drug therapy: Secondary | ICD-10-CM | POA: Diagnosis not present

## 2020-07-21 DIAGNOSIS — F329 Major depressive disorder, single episode, unspecified: Secondary | ICD-10-CM | POA: Insufficient documentation

## 2020-07-21 DIAGNOSIS — C7931 Secondary malignant neoplasm of brain: Secondary | ICD-10-CM | POA: Diagnosis not present

## 2020-07-21 DIAGNOSIS — Z5111 Encounter for antineoplastic chemotherapy: Secondary | ICD-10-CM | POA: Insufficient documentation

## 2020-07-21 DIAGNOSIS — C7951 Secondary malignant neoplasm of bone: Secondary | ICD-10-CM | POA: Diagnosis not present

## 2020-07-21 DIAGNOSIS — C3490 Malignant neoplasm of unspecified part of unspecified bronchus or lung: Secondary | ICD-10-CM | POA: Insufficient documentation

## 2020-07-21 DIAGNOSIS — C3492 Malignant neoplasm of unspecified part of left bronchus or lung: Secondary | ICD-10-CM

## 2020-07-21 DIAGNOSIS — Z87891 Personal history of nicotine dependence: Secondary | ICD-10-CM | POA: Diagnosis not present

## 2020-07-21 DIAGNOSIS — Z5112 Encounter for antineoplastic immunotherapy: Secondary | ICD-10-CM | POA: Insufficient documentation

## 2020-07-21 DIAGNOSIS — Z23 Encounter for immunization: Secondary | ICD-10-CM | POA: Insufficient documentation

## 2020-07-21 LAB — CMP (CANCER CENTER ONLY)
ALT: 20 U/L (ref 0–44)
AST: 26 U/L (ref 15–41)
Albumin: 3.3 g/dL — ABNORMAL LOW (ref 3.5–5.0)
Alkaline Phosphatase: 90 U/L (ref 38–126)
Anion gap: 9 (ref 5–15)
BUN: 6 mg/dL — ABNORMAL LOW (ref 8–23)
CO2: 24 mmol/L (ref 22–32)
Calcium: 9.6 mg/dL (ref 8.9–10.3)
Chloride: 105 mmol/L (ref 98–111)
Creatinine: 1.09 mg/dL (ref 0.61–1.24)
GFR, Estimated: >60 mL/min (ref >60)
Glucose, Bld: 149 mg/dL — ABNORMAL HIGH (ref 70–99)
Potassium: 3.4 mmol/L — ABNORMAL LOW (ref 3.5–5.1)
Sodium: 138 mmol/L (ref 135–145)
Total Bilirubin: 0.5 mg/dL (ref 0.3–1.2)
Total Protein: 7 g/dL (ref 6.5–8.1)

## 2020-07-21 LAB — CBC WITH DIFFERENTIAL (CANCER CENTER ONLY)
Abs Immature Granulocytes: 0.01 10*3/uL (ref 0.00–0.07)
Basophils Absolute: 0 10*3/uL (ref 0.0–0.1)
Basophils Relative: 1 %
Eosinophils Absolute: 0.1 10*3/uL (ref 0.0–0.5)
Eosinophils Relative: 3 %
HCT: 35.1 % — ABNORMAL LOW (ref 39.0–52.0)
Hemoglobin: 11.2 g/dL — ABNORMAL LOW (ref 13.0–17.0)
Immature Granulocytes: 0 %
Lymphocytes Relative: 24 %
Lymphs Abs: 1.1 10*3/uL (ref 0.7–4.0)
MCH: 30.2 pg (ref 26.0–34.0)
MCHC: 31.9 g/dL (ref 30.0–36.0)
MCV: 94.6 fL (ref 80.0–100.0)
Monocytes Absolute: 0.5 10*3/uL (ref 0.1–1.0)
Monocytes Relative: 11 %
Neutro Abs: 2.8 10*3/uL (ref 1.7–7.7)
Neutrophils Relative %: 61 %
Platelet Count: 222 10*3/uL (ref 150–400)
RBC: 3.71 MIL/uL — ABNORMAL LOW (ref 4.22–5.81)
RDW: 18.6 % — ABNORMAL HIGH (ref 11.5–15.5)
WBC Count: 4.6 10*3/uL (ref 4.0–10.5)
nRBC: 0 % (ref 0.0–0.2)

## 2020-07-21 LAB — TSH: TSH: 2.625 u[IU]/mL (ref 0.320–4.118)

## 2020-07-21 MED ORDER — SODIUM CHLORIDE 0.9 % IV SOLN
500.0000 mg/m2 | Freq: Once | INTRAVENOUS | Status: AC
  Administered 2020-07-21: 900 mg via INTRAVENOUS
  Filled 2020-07-21: qty 20

## 2020-07-21 MED ORDER — CYANOCOBALAMIN 1000 MCG/ML IJ SOLN
INTRAMUSCULAR | Status: AC
  Filled 2020-07-21: qty 1

## 2020-07-21 MED ORDER — SODIUM CHLORIDE 0.9 % IV SOLN
Freq: Once | INTRAVENOUS | Status: AC
  Filled 2020-07-21: qty 250

## 2020-07-21 MED ORDER — CYANOCOBALAMIN 1000 MCG/ML IJ SOLN
1000.0000 ug | Freq: Once | INTRAMUSCULAR | Status: AC
  Administered 2020-07-21: 1000 ug via INTRAMUSCULAR

## 2020-07-21 MED ORDER — PROCHLORPERAZINE MALEATE 10 MG PO TABS
10.0000 mg | ORAL_TABLET | Freq: Once | ORAL | Status: AC
  Administered 2020-07-21: 10 mg via ORAL

## 2020-07-21 MED ORDER — SODIUM CHLORIDE 0.9 % IV SOLN
200.0000 mg | Freq: Once | INTRAVENOUS | Status: AC
  Administered 2020-07-21: 200 mg via INTRAVENOUS
  Filled 2020-07-21: qty 8

## 2020-07-21 MED ORDER — PROCHLORPERAZINE MALEATE 10 MG PO TABS
ORAL_TABLET | ORAL | Status: AC
  Filled 2020-07-21: qty 1

## 2020-07-21 NOTE — Patient Instructions (Signed)
[facility] Discharge Instructions for Patients Receiving Chemotherapy  Today you received the following chemotherapy agents: Keytruda, Alimta  To help prevent nausea and vomiting after your treatment, we encourage you to take your nausea medication as directed.    If you develop nausea and vomiting that is not controlled by your nausea medication, call the clinic.   BELOW ARE SYMPTOMS THAT SHOULD BE REPORTED IMMEDIATELY:  *FEVER GREATER THAN 100.5 F  *CHILLS WITH OR WITHOUT FEVER  NAUSEA AND VOMITING THAT IS NOT CONTROLLED WITH YOUR NAUSEA MEDICATION  *UNUSUAL SHORTNESS OF BREATH  *UNUSUAL BRUISING OR BLEEDING  TENDERNESS IN MOUTH AND THROAT WITH OR WITHOUT PRESENCE OF ULCERS  *URINARY PROBLEMS  *BOWEL PROBLEMS  UNUSUAL RASH Items with * indicate a potential emergency and should be followed up as soon as possible.  Feel free to call the clinic should you have any questions or concerns. The clinic phone number is [phone].  Please show the [facility] at check-in to the Emergency Department and triage nurse.

## 2020-07-26 ENCOUNTER — Other Ambulatory Visit: Payer: Self-pay | Admitting: Internal Medicine

## 2020-07-27 NOTE — Progress Notes (Signed)
..  The following Medication: Alimta is approved for drug replacement program by [employer]. The enrollment period is from 09/19/2020 to 09/18/2021.  Reason for Assistance: EOOP. ID: [id number] First DOS:09/22/2020.

## 2020-07-28 ENCOUNTER — Other Ambulatory Visit: Payer: Self-pay

## 2020-07-28 ENCOUNTER — Inpatient Hospital Stay: Payer: Medicare PPO

## 2020-07-28 DIAGNOSIS — C7951 Secondary malignant neoplasm of bone: Secondary | ICD-10-CM | POA: Diagnosis not present

## 2020-07-28 DIAGNOSIS — C7931 Secondary malignant neoplasm of brain: Secondary | ICD-10-CM | POA: Diagnosis not present

## 2020-07-28 DIAGNOSIS — C3490 Malignant neoplasm of unspecified part of unspecified bronchus or lung: Secondary | ICD-10-CM | POA: Diagnosis present

## 2020-07-28 DIAGNOSIS — Z87891 Personal history of nicotine dependence: Secondary | ICD-10-CM | POA: Diagnosis not present

## 2020-07-28 DIAGNOSIS — Z23 Encounter for immunization: Secondary | ICD-10-CM | POA: Diagnosis not present

## 2020-07-28 DIAGNOSIS — Z79899 Other long term (current) drug therapy: Secondary | ICD-10-CM | POA: Diagnosis not present

## 2020-07-28 DIAGNOSIS — F329 Major depressive disorder, single episode, unspecified: Secondary | ICD-10-CM | POA: Diagnosis not present

## 2020-07-28 DIAGNOSIS — Z5112 Encounter for antineoplastic immunotherapy: Secondary | ICD-10-CM | POA: Diagnosis not present

## 2020-07-28 DIAGNOSIS — Z5111 Encounter for antineoplastic chemotherapy: Secondary | ICD-10-CM | POA: Diagnosis present

## 2020-08-06 ENCOUNTER — Other Ambulatory Visit: Payer: Self-pay | Admitting: Physician Assistant

## 2020-08-06 ENCOUNTER — Telehealth: Payer: Self-pay | Admitting: Physician Assistant

## 2020-08-06 DIAGNOSIS — C3492 Malignant neoplasm of unspecified part of left bronchus or lung: Secondary | ICD-10-CM

## 2020-08-06 NOTE — Telephone Encounter (Signed)
The patient did not have a follow up visit with a provider at his last infusion. He needs a restaging CT scan of the chest, abdomen, and pelvis prior to his appointment on 08/11/20. I spoke to the patient's wife and advised her to call radiology scheduling to schedule his scan on 08/10/20. The scan has been authorized. She expressed understanding with the instructions.

## 2020-08-07 ENCOUNTER — Other Ambulatory Visit: Payer: Self-pay

## 2020-08-07 ENCOUNTER — Ambulatory Visit (HOSPITAL_COMMUNITY)
Admission: RE | Admit: 2020-08-07 | Discharge: 2020-08-07 | Disposition: A | Discharge status: Home / Self Care | Payer: Medicare PPO | Source: Ambulatory Visit | Attending: Physician Assistant | Admitting: Physician Assistant

## 2020-08-07 DIAGNOSIS — C3492 Malignant neoplasm of unspecified part of left bronchus or lung: Secondary | ICD-10-CM | POA: Insufficient documentation

## 2020-08-07 DIAGNOSIS — C7951 Secondary malignant neoplasm of bone: Secondary | ICD-10-CM | POA: Diagnosis not present

## 2020-08-07 DIAGNOSIS — C3490 Malignant neoplasm of unspecified part of unspecified bronchus or lung: Secondary | ICD-10-CM | POA: Diagnosis not present

## 2020-08-07 DIAGNOSIS — K811 Chronic cholecystitis: Secondary | ICD-10-CM | POA: Diagnosis not present

## 2020-08-07 IMAGING — CT CT ABD-PELV W/ CM
2 of 5 series · 12 of 36 positions shown, 15 images · IV contrast (OMNIPAQUE)
Comparison: [DATE] the PET of [DATE]

CLINICAL DATA: Stage IV non-small cell lung cancer. Left upper lobe
lung mass with bilateral mediastinal adenopathy and pulmonary
metastasis. Diagnosed [DATE]. Status post IV chemotherapy with
oral chemotherapy ongoing. Evaluate treatment response.

EXAM:
CT CHEST, ABDOMEN, AND PELVIS WITH CONTRAST
TECHNIQUE: Multidetector CT imaging of the chest, abdomen and pelvis was
performed following the standard protocol during bolus
administration of intravenous contrast.
CONTRAST:  100mL OMNIPAQUE IOHEXOL 300 MG/ML  SOLN

[Series 2: cap with · axial · 0.73mm/px · z∈[-601,-96]mm · 9 of 127 slices shown, 12 images]
[im 13/127  mediastinal]
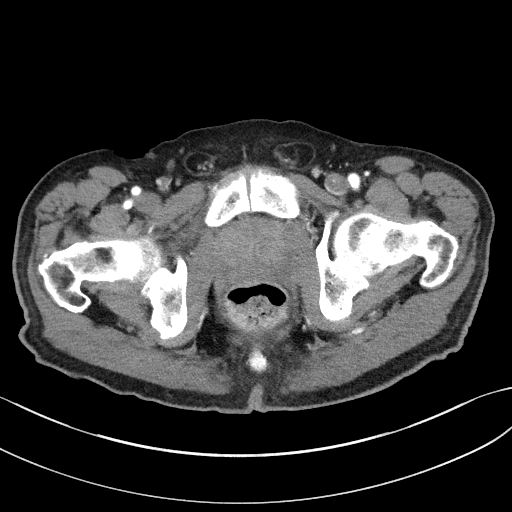
[im 13/127  lung]
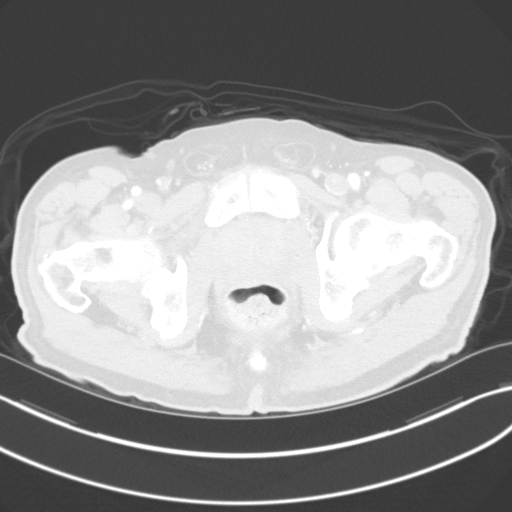
[im 26/127  lung]
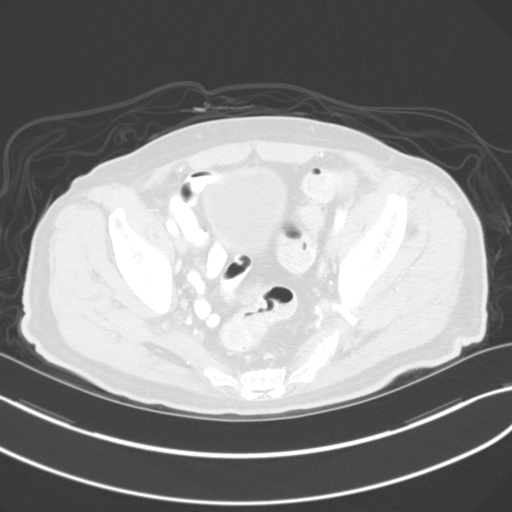
[im 38/127  lung]
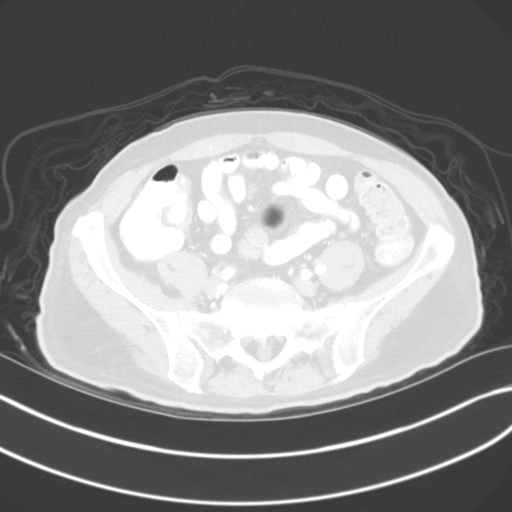
[im 51/127  lung]
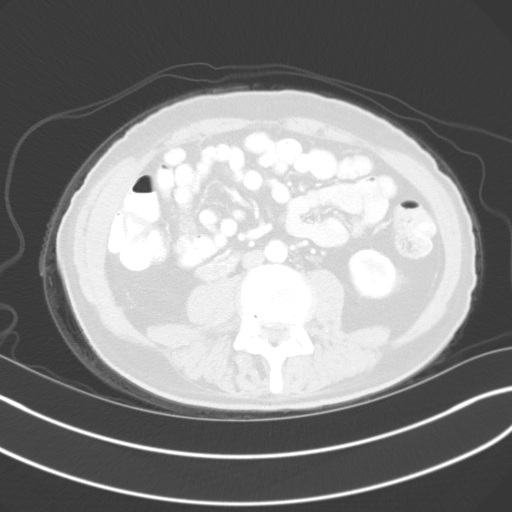
[im 64/127  mediastinal]
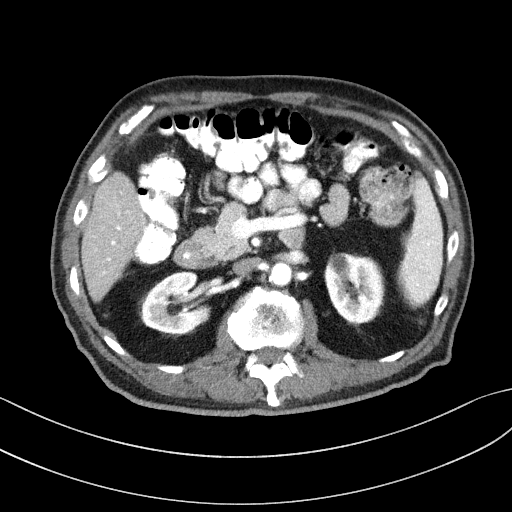
[im 64/127  lung]
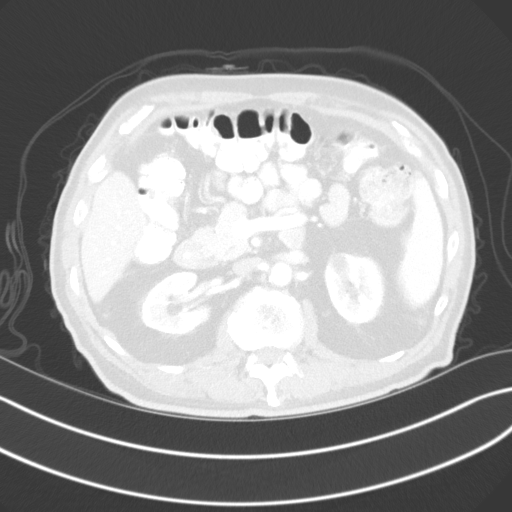
[im 76/127  lung]
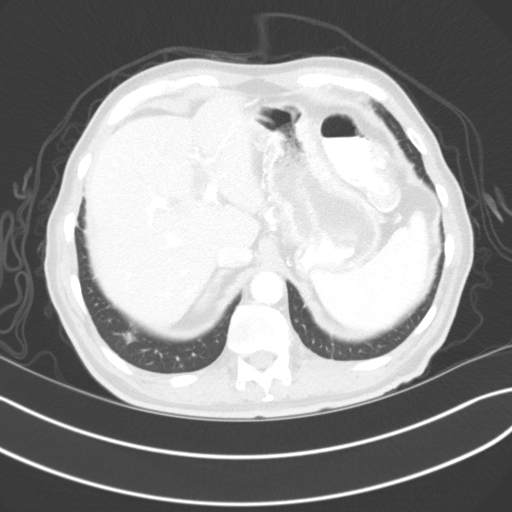
[im 89/127  lung]
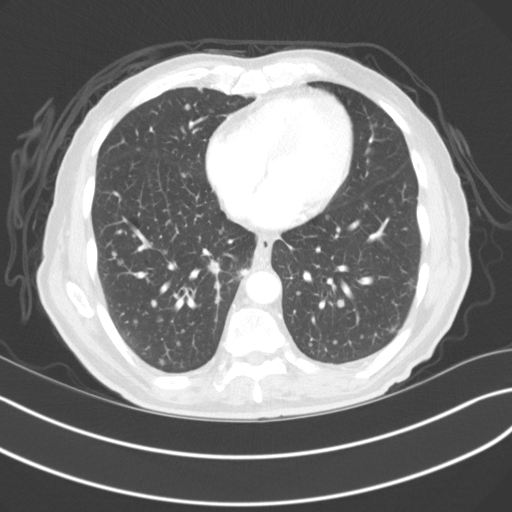
[im 101/127  lung]
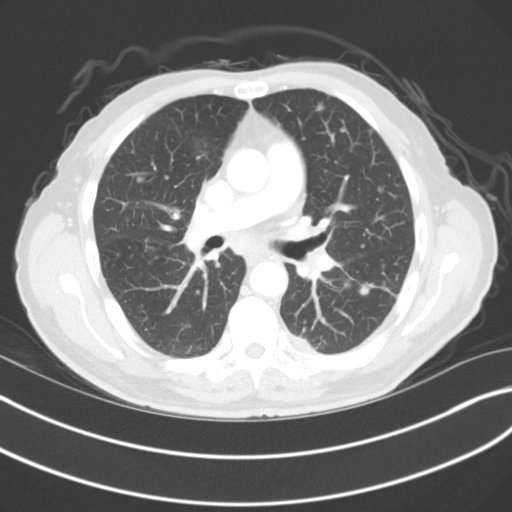
[im 114/127  mediastinal]
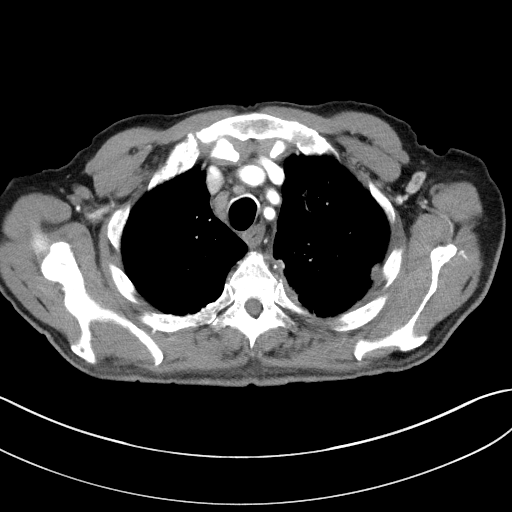
[im 114/127  lung]
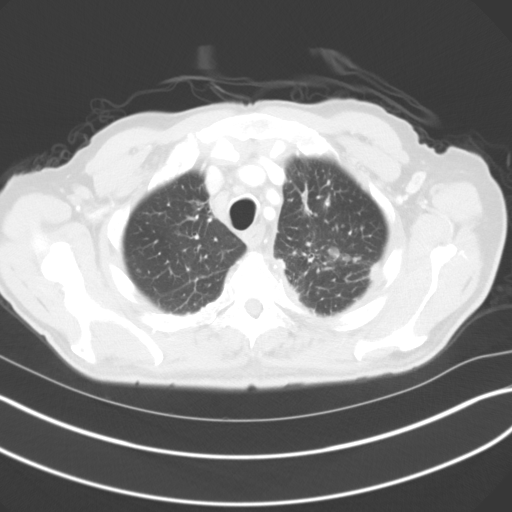

[Series 5: coronals · coronal · 0.68mm/px · 3 of 159 slices shown]
[im 32/159  lung]
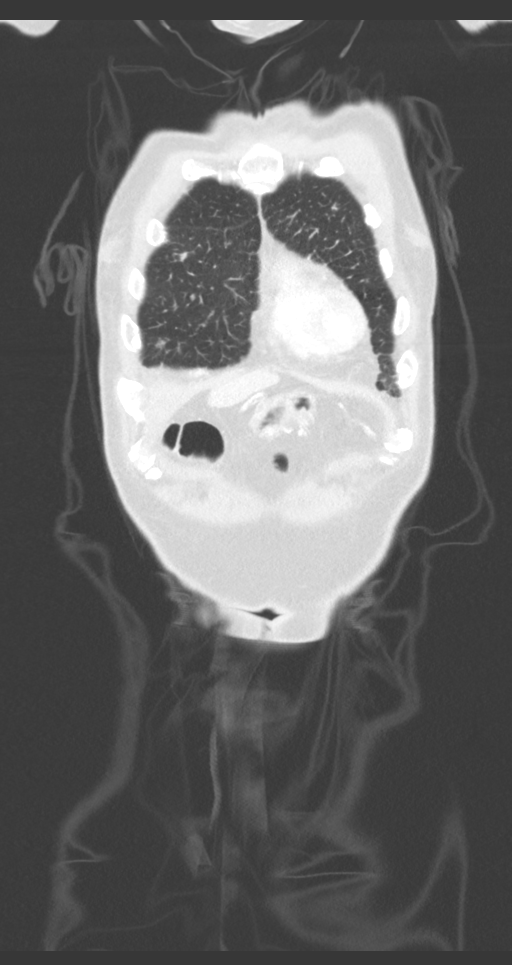
[im 64/159  lung]
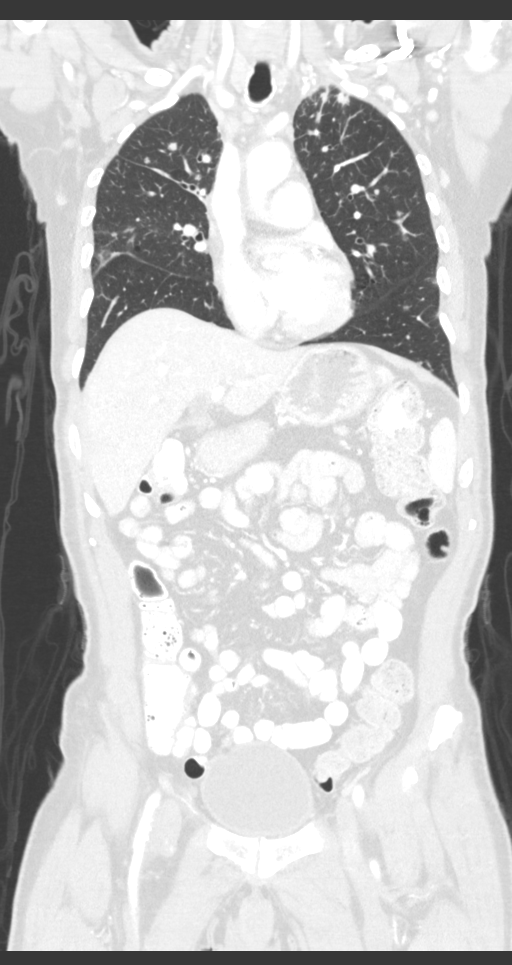
[im 95/159  lung]
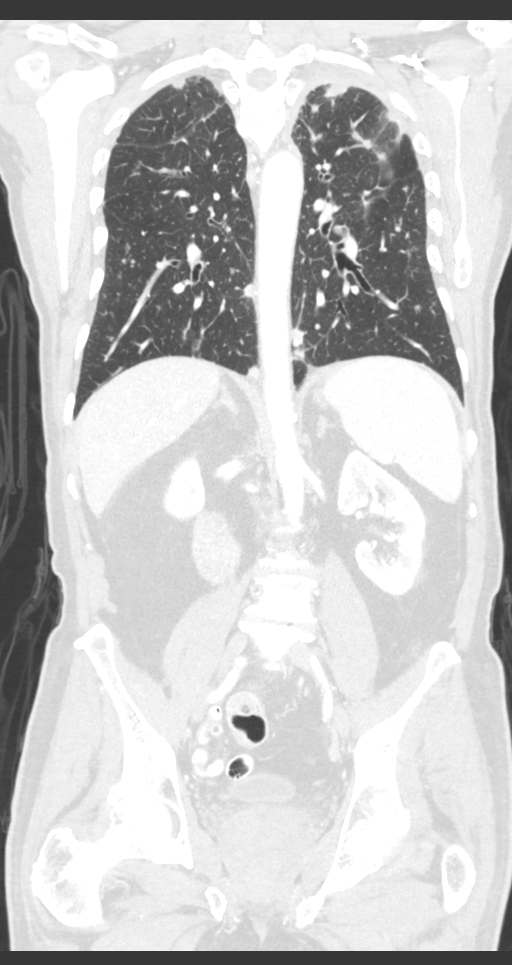

[12 of 36 positions shown; findings below may reference images not displayed]

FINDINGS: CT CHEST FINDINGS

Cardiovascular: Aortic atherosclerosis. Normal heart size, without
pericardial effusion. Multivessel coronary artery atherosclerosis.
No central pulmonary embolism, on this non-dedicated study.

Mediastinum/Nodes: No supraclavicular adenopathy. Right paratracheal
node measures 8 mm on [DATE] versus 10 mm on the prior (when
remeasured).

Subcarinal node measures 1.3 cm on [DATE] versus 1.6 cm on the prior
exam (when remeasured).

Periesophageal 9 mm node on 43/2 is decreased from 10 mm on the
prior exam (when remeasured).

Lungs/Pleura: Left-sided pleural thickening is similar.

Left apical lung mass measures 3.4 x 2.6 cm on [DATE]. When remeasured
in a similar fashion at the same level on the prior, 3.5 x 2.9 cm on
[DATE].

Innumerable bilateral pulmonary nodules, consistent with metastatic
disease.

An index nodule along the right minor fissure measures 1.2 x 1.1 cm
on 78/4. Compare 1.4 x 1.3 cm on the prior.

Index pleural-based left lower lobe pulmonary nodule measures 1.4 cm
on 106/4. Compare 1.2 cm on the prior exam (when remeasured).

More cephalad left lower lobe pulmonary nodule measures 9 mm on 61/4
and is similar to on the prior.

Inferior right upper lobe 11 x 10 mm nodule on 44/4 is similar to 12
x 8 mm on the prior exam.

Musculoskeletal: Extensive osseous metastasis. Some lesions of
undergone interval sclerosis indicative of healing. Example within
the posterior eighth left rib on [DATE].

CT ABDOMEN PELVIS FINDINGS

Hepatobiliary: Normal liver. Gallbladder mucosal hyperenhancement
without calcified stone or surrounding edema. No biliary duct
dilatation.

Pancreas: Normal, without mass or ductal dilatation.

Spleen: Normal in size, without focal abnormality.

Adrenals/Urinary Tract: Normal adrenal glands. Upper pole left renal
1.4 cm cyst. Significant impression of the median lobe prostate into
the urinary bladder.

Stomach/Bowel: Gastric antral underdistention. Normal colon,
appendix, and terminal ileum. Normal small bowel.

Vascular/Lymphatic: Aortic atherosclerosis. No abdominal adenopathy.
Mildly prominent bilateral inguinal nodes are not pathologic by size
criteria and likely reactive.

Reproductive: Moderate prostatomegaly.

Other: No significant free fluid. Tiny fat containing left inguinal
hernia. No evidence of omental or peritoneal disease.

Musculoskeletal: Right-sided L1 lesion of 1.2 cm on 61/2 is
minimally decreased in size compared to 1.4 cm on the prior. More
well-defined and sclerotic today. Mild convex left lumbar spine
curvature.
IMPRESSION: 1. Similar to minimal improvement in left apical lung mass.
2. Similar pulmonary metastasis.
3. Mild improvement in thoracic adenopathy/nodal metastasis.
4. No findings of abdominopelvic soft tissue metastasis.
5. Osseous metastasis, with mild interval response to therapy as
evidenced by increase in sclerosis.
6. Prostatomegaly.
7. Nonspecific gallbladder mucosal hyperenhancement. Correlate with
right upper quadrant symptoms. Chronic cholecystitis could have this
appearance. No surrounding edema to suggest acute inflammation.

## 2020-08-07 IMAGING — CT CT CHEST W/ CM
2 of 5 series · 12 of 36 positions shown, 15 images · IV contrast (OMNIPAQUE)
Comparison: [DATE] the PET of [DATE]

CLINICAL DATA: Stage IV non-small cell lung cancer. Left upper lobe
lung mass with bilateral mediastinal adenopathy and pulmonary
metastasis. Diagnosed [DATE]. Status post IV chemotherapy with
oral chemotherapy ongoing. Evaluate treatment response.

EXAM:
CT CHEST, ABDOMEN, AND PELVIS WITH CONTRAST
TECHNIQUE: Multidetector CT imaging of the chest, abdomen and pelvis was
performed following the standard protocol during bolus
administration of intravenous contrast.
CONTRAST:  100mL OMNIPAQUE IOHEXOL 300 MG/ML  SOLN

[Series 2: cap with · axial · 0.73mm/px · z∈[-601,-96]mm · 9 of 127 slices shown, 12 images]
[im 13/127  mediastinal]
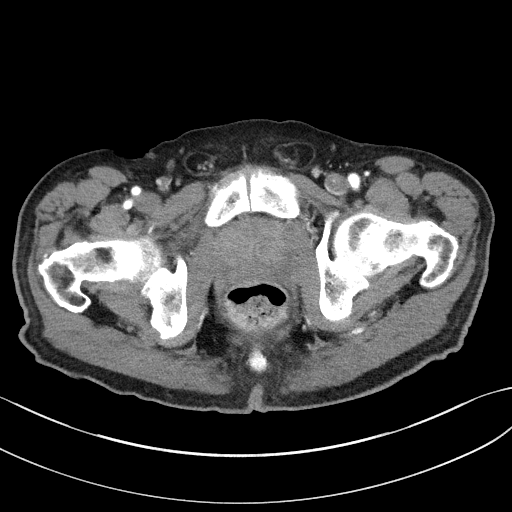
[im 13/127  lung]
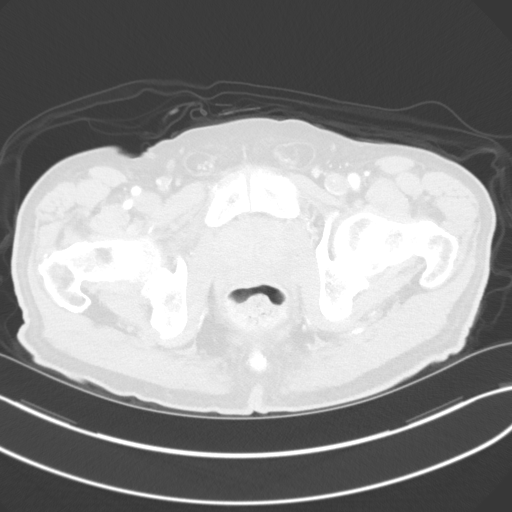
[im 26/127  lung]
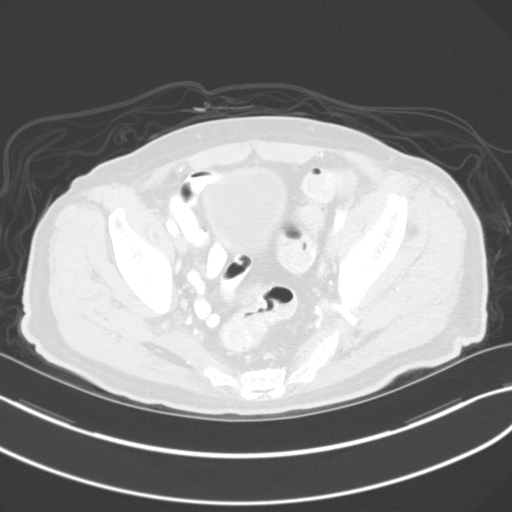
[im 38/127  lung]
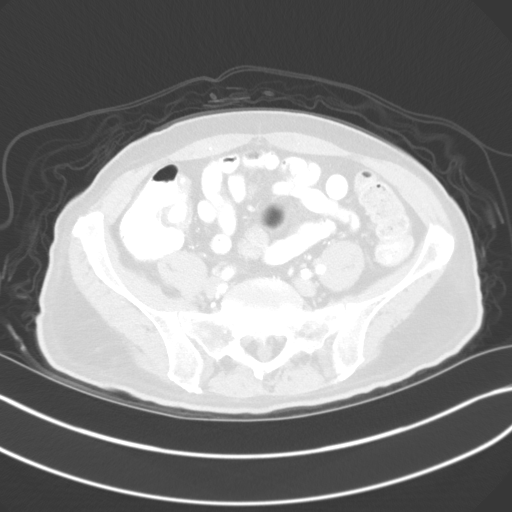
[im 51/127  lung]
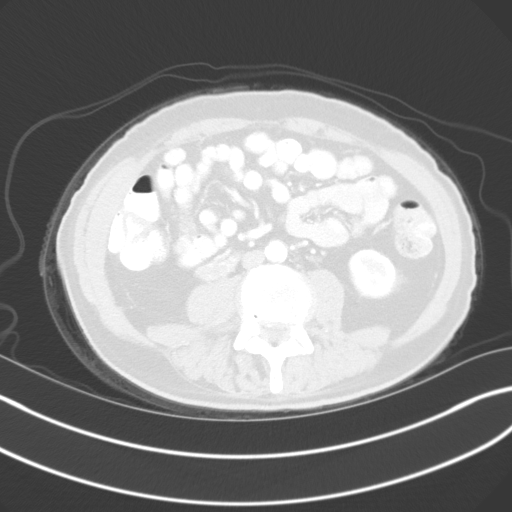
[im 64/127  mediastinal]
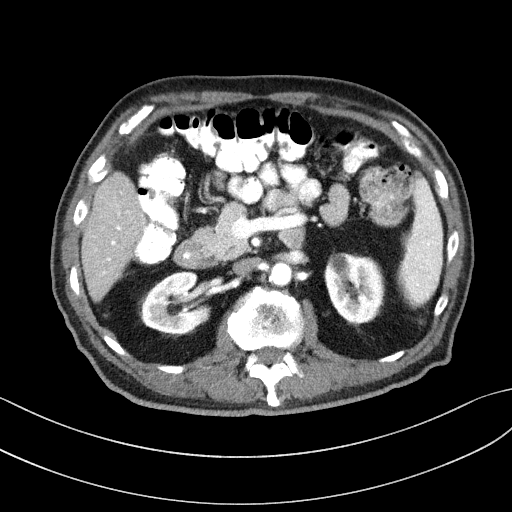
[im 64/127  lung]
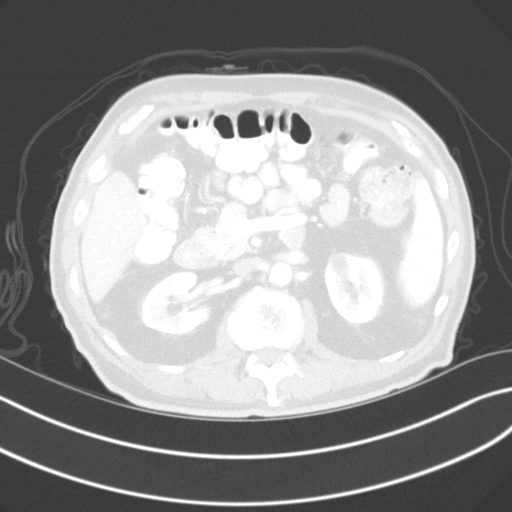
[im 76/127  lung]
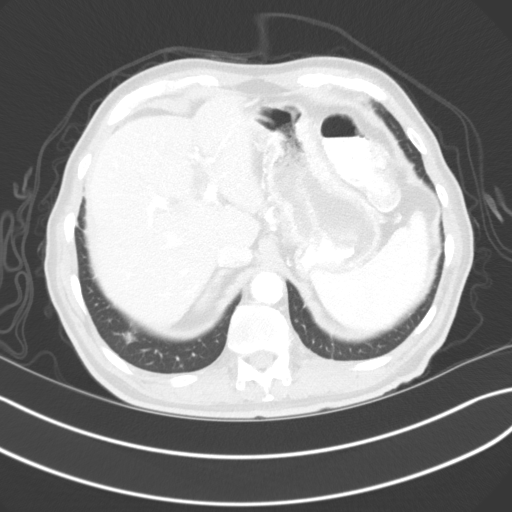
[im 89/127  lung]
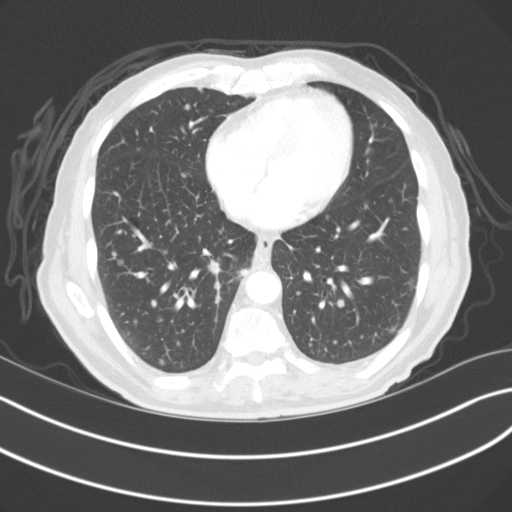
[im 101/127  lung]
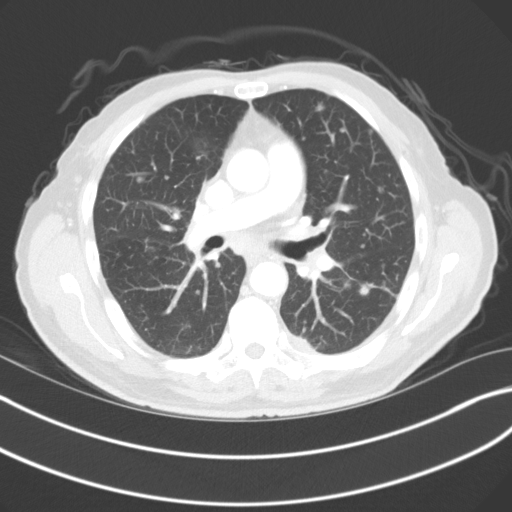
[im 114/127  mediastinal]
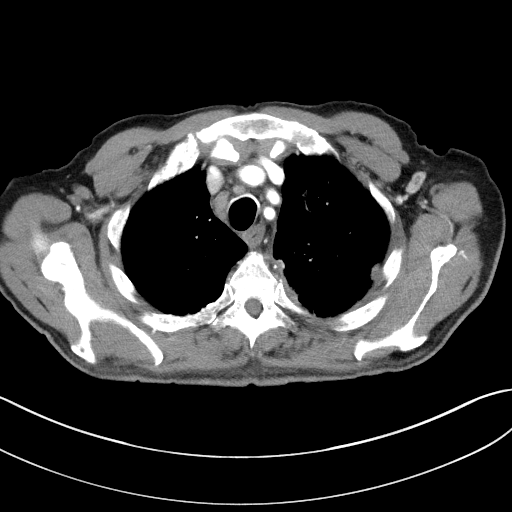
[im 114/127  lung]
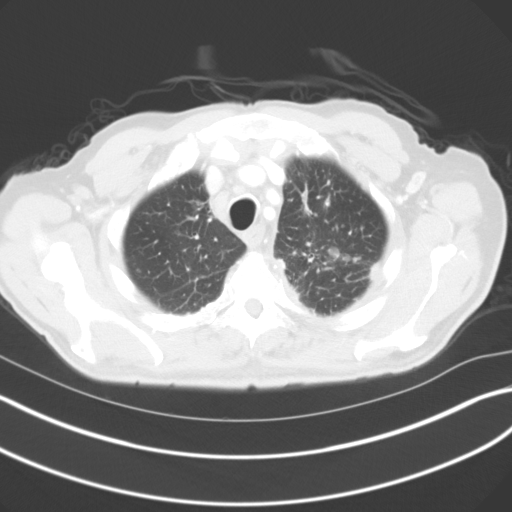

[Series 5: coronals · coronal · 0.68mm/px · 3 of 159 slices shown]
[im 32/159  lung]
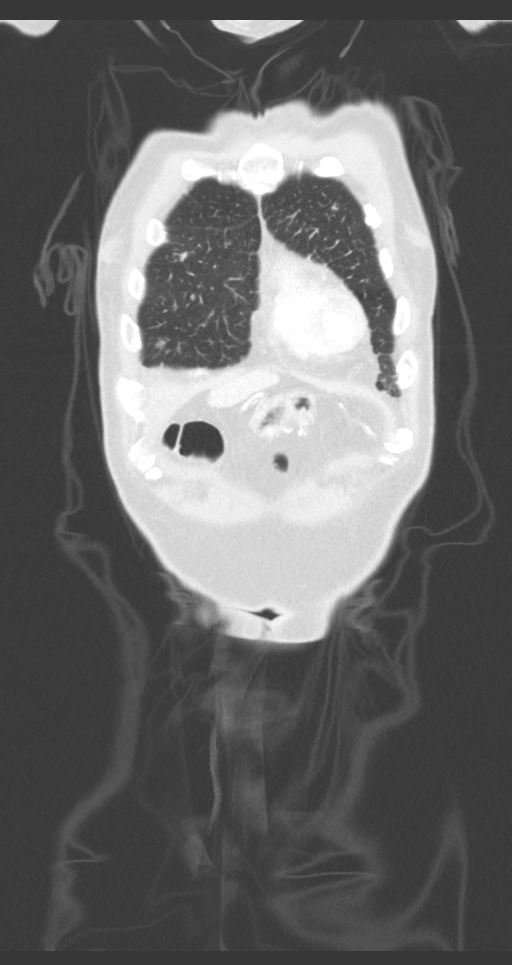
[im 64/159  lung]
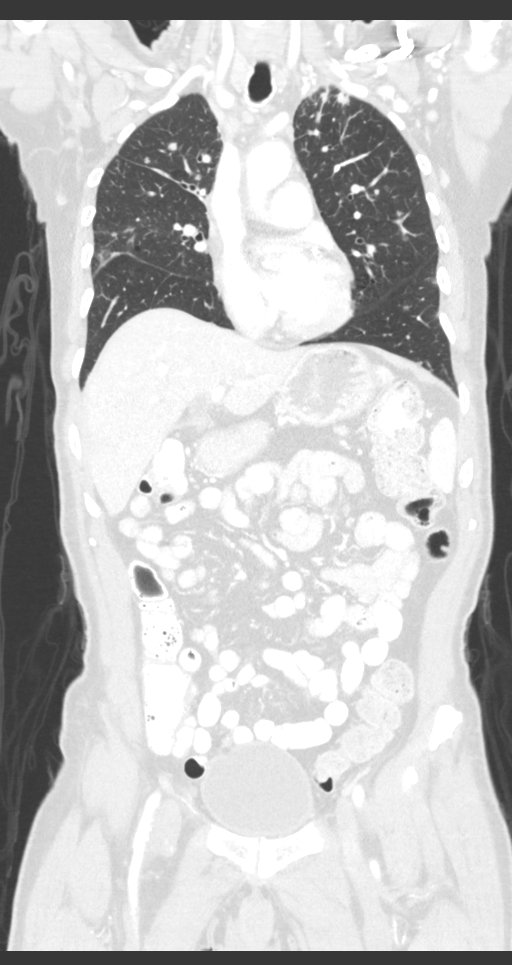
[im 95/159  lung]
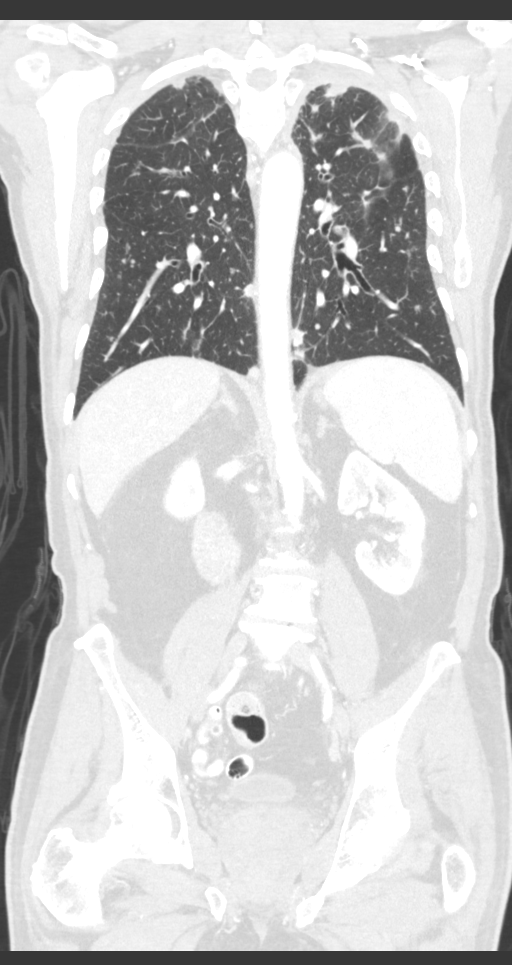

[12 of 36 positions shown; findings below may reference images not displayed]

FINDINGS: CT CHEST FINDINGS

Cardiovascular: Aortic atherosclerosis. Normal heart size, without
pericardial effusion. Multivessel coronary artery atherosclerosis.
No central pulmonary embolism, on this non-dedicated study.

Mediastinum/Nodes: No supraclavicular adenopathy. Right paratracheal
node measures 8 mm on [DATE] versus 10 mm on the prior (when
remeasured).

Subcarinal node measures 1.3 cm on [DATE] versus 1.6 cm on the prior
exam (when remeasured).

Periesophageal 9 mm node on 43/2 is decreased from 10 mm on the
prior exam (when remeasured).

Lungs/Pleura: Left-sided pleural thickening is similar.

Left apical lung mass measures 3.4 x 2.6 cm on [DATE]. When remeasured
in a similar fashion at the same level on the prior, 3.5 x 2.9 cm on
[DATE].

Innumerable bilateral pulmonary nodules, consistent with metastatic
disease.

An index nodule along the right minor fissure measures 1.2 x 1.1 cm
on 78/4. Compare 1.4 x 1.3 cm on the prior.

Index pleural-based left lower lobe pulmonary nodule measures 1.4 cm
on 106/4. Compare 1.2 cm on the prior exam (when remeasured).

More cephalad left lower lobe pulmonary nodule measures 9 mm on 61/4
and is similar to on the prior.

Inferior right upper lobe 11 x 10 mm nodule on 44/4 is similar to 12
x 8 mm on the prior exam.

Musculoskeletal: Extensive osseous metastasis. Some lesions of
undergone interval sclerosis indicative of healing. Example within
the posterior eighth left rib on [DATE].

CT ABDOMEN PELVIS FINDINGS

Hepatobiliary: Normal liver. Gallbladder mucosal hyperenhancement
without calcified stone or surrounding edema. No biliary duct
dilatation.

Pancreas: Normal, without mass or ductal dilatation.

Spleen: Normal in size, without focal abnormality.

Adrenals/Urinary Tract: Normal adrenal glands. Upper pole left renal
1.4 cm cyst. Significant impression of the median lobe prostate into
the urinary bladder.

Stomach/Bowel: Gastric antral underdistention. Normal colon,
appendix, and terminal ileum. Normal small bowel.

Vascular/Lymphatic: Aortic atherosclerosis. No abdominal adenopathy.
Mildly prominent bilateral inguinal nodes are not pathologic by size
criteria and likely reactive.

Reproductive: Moderate prostatomegaly.

Other: No significant free fluid. Tiny fat containing left inguinal
hernia. No evidence of omental or peritoneal disease.

Musculoskeletal: Right-sided L1 lesion of 1.2 cm on 61/2 is
minimally decreased in size compared to 1.4 cm on the prior. More
well-defined and sclerotic today. Mild convex left lumbar spine
curvature.
IMPRESSION: 1. Similar to minimal improvement in left apical lung mass.
2. Similar pulmonary metastasis.
3. Mild improvement in thoracic adenopathy/nodal metastasis.
4. No findings of abdominopelvic soft tissue metastasis.
5. Osseous metastasis, with mild interval response to therapy as
evidenced by increase in sclerosis.
6. Prostatomegaly.
7. Nonspecific gallbladder mucosal hyperenhancement. Correlate with
right upper quadrant symptoms. Chronic cholecystitis could have this
appearance. No surrounding edema to suggest acute inflammation.

## 2020-08-07 MED ORDER — IOHEXOL 300 MG/ML  SOLN
100.0000 mL | Freq: Once | INTRAMUSCULAR | Status: AC | PRN
  Administered 2020-08-07: 100 mL via INTRAVENOUS

## 2020-08-11 ENCOUNTER — Encounter: Payer: Self-pay | Admitting: *Deleted

## 2020-08-11 ENCOUNTER — Other Ambulatory Visit: Payer: Self-pay | Admitting: Physician Assistant

## 2020-08-11 ENCOUNTER — Inpatient Hospital Stay: Payer: Medicare PPO

## 2020-08-11 ENCOUNTER — Inpatient Hospital Stay (HOSPITAL_BASED_OUTPATIENT_CLINIC_OR_DEPARTMENT_OTHER): Payer: Medicare PPO | Admitting: Internal Medicine

## 2020-08-11 ENCOUNTER — Encounter: Payer: Self-pay | Admitting: Internal Medicine

## 2020-08-11 ENCOUNTER — Other Ambulatory Visit: Payer: Self-pay

## 2020-08-11 VITALS — BP 114/54 | HR 76 | Temp 98.1°F | Resp 17 | Ht 69.0 in | Wt 147.3 lb

## 2020-08-11 DIAGNOSIS — C7931 Secondary malignant neoplasm of brain: Secondary | ICD-10-CM

## 2020-08-11 DIAGNOSIS — E876 Hypokalemia: Secondary | ICD-10-CM

## 2020-08-11 DIAGNOSIS — Z5111 Encounter for antineoplastic chemotherapy: Secondary | ICD-10-CM | POA: Diagnosis not present

## 2020-08-11 DIAGNOSIS — Z5112 Encounter for antineoplastic immunotherapy: Secondary | ICD-10-CM

## 2020-08-11 DIAGNOSIS — C7951 Secondary malignant neoplasm of bone: Secondary | ICD-10-CM | POA: Diagnosis not present

## 2020-08-11 DIAGNOSIS — C3490 Malignant neoplasm of unspecified part of unspecified bronchus or lung: Secondary | ICD-10-CM | POA: Diagnosis not present

## 2020-08-11 DIAGNOSIS — Z79899 Other long term (current) drug therapy: Secondary | ICD-10-CM | POA: Diagnosis not present

## 2020-08-11 DIAGNOSIS — F329 Major depressive disorder, single episode, unspecified: Secondary | ICD-10-CM | POA: Diagnosis not present

## 2020-08-11 DIAGNOSIS — C3492 Malignant neoplasm of unspecified part of left bronchus or lung: Secondary | ICD-10-CM | POA: Diagnosis not present

## 2020-08-11 DIAGNOSIS — E039 Hypothyroidism, unspecified: Secondary | ICD-10-CM

## 2020-08-11 DIAGNOSIS — Z87891 Personal history of nicotine dependence: Secondary | ICD-10-CM | POA: Diagnosis not present

## 2020-08-11 LAB — CBC WITH DIFFERENTIAL (CANCER CENTER ONLY)
Abs Immature Granulocytes: 0.02 10*3/uL (ref 0.00–0.07)
Basophils Absolute: 0 10*3/uL (ref 0.0–0.1)
Basophils Relative: 0 %
Eosinophils Absolute: 0.1 10*3/uL (ref 0.0–0.5)
Eosinophils Relative: 2 %
HCT: 32.7 % — ABNORMAL LOW (ref 39.0–52.0)
Hemoglobin: 10.7 g/dL — ABNORMAL LOW (ref 13.0–17.0)
Immature Granulocytes: 0 %
Lymphocytes Relative: 21 %
Lymphs Abs: 1 10*3/uL (ref 0.7–4.0)
MCH: 31.8 pg (ref 26.0–34.0)
MCHC: 32.7 g/dL (ref 30.0–36.0)
MCV: 97 fL (ref 80.0–100.0)
Monocytes Absolute: 0.5 10*3/uL (ref 0.1–1.0)
Monocytes Relative: 11 %
Neutro Abs: 3 10*3/uL (ref 1.7–7.7)
Neutrophils Relative %: 66 %
Platelet Count: 183 10*3/uL (ref 150–400)
RBC: 3.37 MIL/uL — ABNORMAL LOW (ref 4.22–5.81)
RDW: 17.1 % — ABNORMAL HIGH (ref 11.5–15.5)
WBC Count: 4.6 10*3/uL (ref 4.0–10.5)
nRBC: 0 % (ref 0.0–0.2)

## 2020-08-11 LAB — CMP (CANCER CENTER ONLY)
ALT: 11 U/L (ref 0–44)
AST: 20 U/L (ref 15–41)
Albumin: 2.9 g/dL — ABNORMAL LOW (ref 3.5–5.0)
Alkaline Phosphatase: 92 U/L (ref 38–126)
Anion gap: 10 (ref 5–15)
BUN: 6 mg/dL — ABNORMAL LOW (ref 8–23)
CO2: 24 mmol/L (ref 22–32)
Calcium: 9.1 mg/dL (ref 8.9–10.3)
Chloride: 107 mmol/L (ref 98–111)
Creatinine: 1.06 mg/dL (ref 0.61–1.24)
GFR, Estimated: >60 mL/min (ref >60)
Glucose, Bld: 141 mg/dL — ABNORMAL HIGH (ref 70–99)
Potassium: 3.2 mmol/L — ABNORMAL LOW (ref 3.5–5.1)
Sodium: 141 mmol/L (ref 135–145)
Total Bilirubin: 0.4 mg/dL (ref 0.3–1.2)
Total Protein: 6.5 g/dL (ref 6.5–8.1)

## 2020-08-11 LAB — TSH: TSH: 0.43 u[IU]/mL (ref 0.320–4.118)

## 2020-08-11 MED ORDER — SODIUM CHLORIDE 0.9 % IV SOLN
Freq: Once | INTRAVENOUS | Status: AC
  Filled 2020-08-11: qty 250

## 2020-08-11 MED ORDER — POTASSIUM CHLORIDE CRYS ER 20 MEQ PO TBCR: 20.0000 meq | EXTENDED_RELEASE_TABLET | Freq: Every day | ORAL | 0 refills | Status: DC

## 2020-08-11 MED ORDER — PROCHLORPERAZINE MALEATE 10 MG PO TABS
10.0000 mg | ORAL_TABLET | Freq: Once | ORAL | Status: AC
  Administered 2020-08-11: 10 mg via ORAL

## 2020-08-11 MED ORDER — SODIUM CHLORIDE 0.9 % IV SOLN
200.0000 mg | Freq: Once | INTRAVENOUS | Status: AC
  Administered 2020-08-11: 200 mg via INTRAVENOUS
  Filled 2020-08-11: qty 8

## 2020-08-11 MED ORDER — SODIUM CHLORIDE 0.9 % IV SOLN
500.0000 mg/m2 | Freq: Once | INTRAVENOUS | Status: AC
  Administered 2020-08-11: 900 mg via INTRAVENOUS
  Filled 2020-08-11: qty 20

## 2020-08-11 MED ORDER — PROCHLORPERAZINE MALEATE 10 MG PO TABS
ORAL_TABLET | ORAL | Status: AC
  Filled 2020-08-11: qty 1

## 2020-08-11 NOTE — Progress Notes (Signed)
[place] [phone]   [phone]) [phone]  OFFICE PROGRESS NOTE  Seward Carol, MD [address] [place] [address]  DIAGNOSIS: Stage IV (T4, N3, M1c) non-small cell lung cancer, adenocarcinoma presented with left upper lobe lung mass in addition to bilateral mediastinal lymphadenopathy as well as bilateral pulmonary nodules and metastatic disease to the bones and brain diagnosed in July 2021.  Molecular studies by [employer] showed no actionable mutations.  PRIOR THERAPY: None  CURRENT THERAPY: Systemic chemotherapy with carboplatin for AUC of 5, Alimta 500 mg/M2 and Keytruda 200 mg IV every 3 weeks.  First dose 04/06/2020.  Status post 6 cycles.  INTERVAL HISTORY: Phillip Franklin 79 y.o. male returns to the clinic today for follow-up visit accompanied by his wife [name].  The patient is feeling much better today and more engaged.  He denied having any current chest pain but still have shortness of breath with exertion with mild cough and no hemoptysis.  He denied having any chest pain.  He denied having any fever or chills.  He has no nausea, vomiting, diarrhea or constipation.  He has no headache or visual changes.  The patient denied having any recent weight loss or night sweats.  He has been tolerating his treatment fairly well.  He had repeat CT scan of the chest, abdomen pelvis performed recently and is here for evaluation and discussion of his discuss results.   MEDICAL HISTORY: Past Medical History:  Diagnosis Date  . Depression   . GERD (gastroesophageal reflux disease)    occasional, diet controlled  . High cholesterol   . Hypertension    no longer taking medication - states it's undercontrol  . Hypothyroid     ALLERGIES:  is allergic to codeine, hydrocodone, oxycodone, and penicillins.  MEDICATIONS:  Current Outpatient Medications  Medication Sig Dispense Refill  . acetaminophen (TYLENOL) 325 MG tablet Take 325-650  mg by mouth every 6 (six) hours as needed for moderate pain or headache.    Marland Kitchen amoxicillin-clavulanate (AUGMENTIN) 875-125 MG tablet Take 1 tablet by mouth 2 (two) times daily.    Marland Kitchen atorvastatin (LIPITOR) 20 MG tablet Take 20 mg by mouth daily.    . benzonatate (TESSALON) 200 MG capsule Take 1 capsule (200 mg total) by mouth 4 (four) times daily as needed for cough. 60 capsule 1  . citalopram (CELEXA) 20 MG tablet Take 1 tablet (20 mg total) by mouth daily. 30 tablet 3  . dronabinol (MARINOL) 2.5 MG capsule Take 1 capsule (2.5 mg total) by mouth 2 (two) times daily before a meal. (Patient taking differently: Take 2.5 mg by mouth 2 (two) times daily before a meal. Patient is only taking once a day) 60 capsule 2  . esomeprazole (NEXIUM) 20 MG capsule Take 20 mg by mouth daily as needed (acid reflux).     . folic acid (FOLVITE) 1 MG tablet Take 1 tablet by mouth once daily 30 tablet 0  . HYDROmorphone (DILAUDID) 2 MG tablet Take 2 mg by mouth 4 (four) times daily.    Marland Kitchen levothyroxine (SYNTHROID) 88 MCG tablet Take 88 mcg by mouth daily before breakfast.    . LORazepam (ATIVAN) 0.5 MG tablet Take 1-2 tablets by mouth 30 minutes before procedures, PRN anxiety. 8 tablet 0  . Multiple Vitamins-Minerals (CENTRUM SILVER 50+MEN) TABS Take 1 tablet by mouth daily.    Marland Kitchen neomycin-polymyxin b-dexamethasone (MAXITROL) 3.5-10000-0.1 SUSP Place 1 drop into the right eye every  8 (eight) hours.    . prochlorperazine (COMPAZINE) 10 MG tablet Take 1 tablet (10 mg total) by mouth every 6 (six) hours as needed for nausea or vomiting. 30 tablet 0  . sulfamethoxazole-trimethoprim (BACTRIM DS) 800-160 MG tablet Take 1 tablet by mouth 2 (two) times daily. 14 tablet 0  . tamsulosin (FLOMAX) 0.4 MG CAPS capsule Take 0.4 mg by mouth every evening.     . traMADol (ULTRAM) 50 MG tablet Take 1 tablet (50 mg total) by mouth every 6 (six) hours as needed. 30 tablet 0   No current facility-administered medications for this visit.     SURGICAL HISTORY:  Past Surgical History:  Procedure Laterality Date  . BRONCHIAL NEEDLE ASPIRATION BIOPSY  03/24/2020   Procedure: BRONCHIAL NEEDLE ASPIRATION BIOPSIES;  Surgeon: Candee Furbish, MD;  Location: [facility] ENDOSCOPY;  Service: Pulmonary;;  . HEMORRHOID SURGERY    . VIDEO BRONCHOSCOPY WITH ENDOBRONCHIAL ULTRASOUND N/A 03/24/2020   Procedure: VIDEO BRONCHOSCOPY WITH ENDOBRONCHIAL ULTRASOUND;  Surgeon: Candee Furbish, MD;  Location: [facility] ENDOSCOPY;  Service: Pulmonary;  Laterality: N/A;    REVIEW OF SYSTEMS:  Constitutional: positive for anorexia and fatigue Eyes: negative Ears, nose, mouth, throat, and face: negative Respiratory: positive for cough and dyspnea on exertion Cardiovascular: negative Gastrointestinal: negative Genitourinary:negative Integument/breast: negative Hematologic/lymphatic: negative Musculoskeletal:positive for muscle weakness Neurological: negative Behavioral/Psych: positive for depression Endocrine: negative Allergic/Immunologic: negative   PHYSICAL EXAMINATION: General appearance: alert, cooperative, fatigued and no distress Head: Normocephalic, without obvious abnormality, atraumatic Neck: no adenopathy, no JVD, supple, symmetrical, trachea midline and thyroid not enlarged, symmetric, no tenderness/mass/nodules Lymph nodes: Cervical, supraclavicular, and axillary nodes normal. Resp: clear to auscultation bilaterally Back: symmetric, no curvature. ROM normal. No CVA tenderness. Cardio: regular rate and rhythm, S1, S2 normal, no murmur, click, rub or gallop GI: soft, non-tender; bowel sounds normal; no masses,  no organomegaly Extremities: extremities normal, atraumatic, no cyanosis or edema Neurologic: Alert and oriented X 3, normal strength and tone. Normal symmetric reflexes. Normal coordination and gait  ECOG PERFORMANCE STATUS: 1 - Symptomatic but completely ambulatory  Blood pressure (!) 114/54, pulse 76, temperature 98.1 F (36.7 C),  temperature source Tympanic, resp. rate 17, height 5\' 9"  (1.753 m), weight 147 lb 4.8 oz (66.8 kg), SpO2 99 %.  LABORATORY DATA: Lab Results  Component Value Date   WBC 4.6 07/21/2020   HGB 11.2 (L) 07/21/2020   HCT 35.1 (L) 07/21/2020   MCV 94.6 07/21/2020   PLT 222 07/21/2020      Chemistry      Component Value Date/Time   NA 138 07/21/2020 1333   K 3.4 (L) 07/21/2020 1333   CL 105 07/21/2020 1333   CO2 24 07/21/2020 1333   BUN 6 (L) 07/21/2020 1333   CREATININE 1.09 07/21/2020 1333      Component Value Date/Time   CALCIUM 9.6 07/21/2020 1333   ALKPHOS 90 07/21/2020 1333   AST 26 07/21/2020 1333   ALT 20 07/21/2020 1333   BILITOT 0.5 07/21/2020 1333       RADIOGRAPHIC STUDIES: CT Chest W Contrast  Result Date: 08/07/2020 CLINICAL DATA:  Stage IV non-small cell lung cancer. Left upper lobe lung mass with bilateral mediastinal adenopathy and pulmonary metastasis. Diagnosed 04/21/2020. Status post IV chemotherapy with oral chemotherapy ongoing. Evaluate treatment response. EXAM: CT CHEST, ABDOMEN, AND PELVIS WITH CONTRAST TECHNIQUE: Multidetector CT imaging of the chest, abdomen and pelvis was performed following the standard protocol during bolus administration of intravenous contrast. CONTRAST:  155mL OMNIPAQUE IOHEXOL 300  MG/ML  SOLN COMPARISON:  06/04/2020 the PET of 03/20/2020 FINDINGS: CT CHEST FINDINGS Cardiovascular: Aortic atherosclerosis. Normal heart size, without pericardial effusion. Multivessel coronary artery atherosclerosis. No central pulmonary embolism, on this non-dedicated study. Mediastinum/Nodes: No supraclavicular adenopathy. Right paratracheal node measures 8 mm on 15/2 versus 10 mm on the prior (when remeasured). Subcarinal node measures 1.3 cm on 29/2 versus 1.6 cm on the prior exam (when remeasured). Periesophageal 9 mm node on 43/2 is decreased from 10 mm on the prior exam (when remeasured). Lungs/Pleura: Left-sided pleural thickening is similar. Left  apical lung mass measures 3.4 x 2.6 cm on 26/4. When remeasured in a similar fashion at the same level on the prior, 3.5 x 2.9 cm on 20/4. Innumerable bilateral pulmonary nodules, consistent with metastatic disease. An index nodule along the right minor fissure measures 1.2 x 1.1 cm on 78/4. Compare 1.4 x 1.3 cm on the prior. Index pleural-based left lower lobe pulmonary nodule measures 1.4 cm on 106/4. Compare 1.2 cm on the prior exam (when remeasured). More cephalad left lower lobe pulmonary nodule measures 9 mm on 61/4 and is similar to on the prior. Inferior right upper lobe 11 x 10 mm nodule on 44/4 is similar to 12 x 8 mm on the prior exam. Musculoskeletal: Extensive osseous metastasis. Some lesions of undergone interval sclerosis indicative of healing. Example within the posterior eighth left rib on 31/2. CT ABDOMEN PELVIS FINDINGS Hepatobiliary: Normal liver. Gallbladder mucosal hyperenhancement without calcified stone or surrounding edema. No biliary duct dilatation. Pancreas: Normal, without mass or ductal dilatation. Spleen: Normal in size, without focal abnormality. Adrenals/Urinary Tract: Normal adrenal glands. Upper pole left renal 1.4 cm cyst. Significant impression of the median lobe prostate into the urinary bladder. Stomach/Bowel: Gastric antral underdistention. Normal colon, appendix, and terminal ileum. Normal small bowel. Vascular/Lymphatic: Aortic atherosclerosis. No abdominal adenopathy. Mildly prominent bilateral inguinal nodes are not pathologic by size criteria and likely reactive. Reproductive: Moderate prostatomegaly. Other: No significant free fluid. Tiny fat containing left inguinal hernia. No evidence of omental or peritoneal disease. Musculoskeletal: Right-sided L1 lesion of 1.2 cm on 61/2 is minimally decreased in size compared to 1.4 cm on the prior. More well-defined and sclerotic today. Mild convex left lumbar spine curvature. IMPRESSION: 1. Similar to minimal improvement in left  apical lung mass. 2. Similar pulmonary metastasis. 3. Mild improvement in thoracic adenopathy/nodal metastasis. 4. No findings of abdominopelvic soft tissue metastasis. 5. Osseous metastasis, with mild interval response to therapy as evidenced by increase in sclerosis. 6. Prostatomegaly. 7. Nonspecific gallbladder mucosal hyperenhancement. Correlate with right upper quadrant symptoms. Chronic cholecystitis could have this appearance. No surrounding edema to suggest acute inflammation. Electronically Signed   By: Abigail Miyamoto M.D.   On: 08/07/2020 16:12   CT Abdomen Pelvis W Contrast  Result Date: 08/07/2020 CLINICAL DATA:  Stage IV non-small cell lung cancer. Left upper lobe lung mass with bilateral mediastinal adenopathy and pulmonary metastasis. Diagnosed 04/21/2020. Status post IV chemotherapy with oral chemotherapy ongoing. Evaluate treatment response. EXAM: CT CHEST, ABDOMEN, AND PELVIS WITH CONTRAST TECHNIQUE: Multidetector CT imaging of the chest, abdomen and pelvis was performed following the standard protocol during bolus administration of intravenous contrast. CONTRAST:  176mL OMNIPAQUE IOHEXOL 300 MG/ML  SOLN COMPARISON:  06/04/2020 the PET of 03/20/2020 FINDINGS: CT CHEST FINDINGS Cardiovascular: Aortic atherosclerosis. Normal heart size, without pericardial effusion. Multivessel coronary artery atherosclerosis. No central pulmonary embolism, on this non-dedicated study. Mediastinum/Nodes: No supraclavicular adenopathy. Right paratracheal node measures 8 mm on 15/2 versus 10 mm  on the prior (when remeasured). Subcarinal node measures 1.3 cm on 29/2 versus 1.6 cm on the prior exam (when remeasured). Periesophageal 9 mm node on 43/2 is decreased from 10 mm on the prior exam (when remeasured). Lungs/Pleura: Left-sided pleural thickening is similar. Left apical lung mass measures 3.4 x 2.6 cm on 26/4. When remeasured in a similar fashion at the same level on the prior, 3.5 x 2.9 cm on 20/4. Innumerable  bilateral pulmonary nodules, consistent with metastatic disease. An index nodule along the right minor fissure measures 1.2 x 1.1 cm on 78/4. Compare 1.4 x 1.3 cm on the prior. Index pleural-based left lower lobe pulmonary nodule measures 1.4 cm on 106/4. Compare 1.2 cm on the prior exam (when remeasured). More cephalad left lower lobe pulmonary nodule measures 9 mm on 61/4 and is similar to on the prior. Inferior right upper lobe 11 x 10 mm nodule on 44/4 is similar to 12 x 8 mm on the prior exam. Musculoskeletal: Extensive osseous metastasis. Some lesions of undergone interval sclerosis indicative of healing. Example within the posterior eighth left rib on 31/2. CT ABDOMEN PELVIS FINDINGS Hepatobiliary: Normal liver. Gallbladder mucosal hyperenhancement without calcified stone or surrounding edema. No biliary duct dilatation. Pancreas: Normal, without mass or ductal dilatation. Spleen: Normal in size, without focal abnormality. Adrenals/Urinary Tract: Normal adrenal glands. Upper pole left renal 1.4 cm cyst. Significant impression of the median lobe prostate into the urinary bladder. Stomach/Bowel: Gastric antral underdistention. Normal colon, appendix, and terminal ileum. Normal small bowel. Vascular/Lymphatic: Aortic atherosclerosis. No abdominal adenopathy. Mildly prominent bilateral inguinal nodes are not pathologic by size criteria and likely reactive. Reproductive: Moderate prostatomegaly. Other: No significant free fluid. Tiny fat containing left inguinal hernia. No evidence of omental or peritoneal disease. Musculoskeletal: Right-sided L1 lesion of 1.2 cm on 61/2 is minimally decreased in size compared to 1.4 cm on the prior. More well-defined and sclerotic today. Mild convex left lumbar spine curvature. IMPRESSION: 1. Similar to minimal improvement in left apical lung mass. 2. Similar pulmonary metastasis. 3. Mild improvement in thoracic adenopathy/nodal metastasis. 4. No findings of abdominopelvic soft  tissue metastasis. 5. Osseous metastasis, with mild interval response to therapy as evidenced by increase in sclerosis. 6. Prostatomegaly. 7. Nonspecific gallbladder mucosal hyperenhancement. Correlate with right upper quadrant symptoms. Chronic cholecystitis could have this appearance. No surrounding edema to suggest acute inflammation. Electronically Signed   By: Abigail Miyamoto M.D.   On: 08/07/2020 16:12    ASSESSMENT AND PLAN: This is a very pleasant 79 years old white male recently diagnosed with a stage IV (T4, N3, M1C) non-small cell lung cancer, adenocarcinoma presented with left upper lobe lung mass in addition to bilateral pulmonary nodules as well as bilateral hilar and mediastinal lymphadenopathy and metastatic disease to the bones and brain diagnosed in July 2021. He underwent stereotactic radiotherapy to the metastatic brain lesion under the care of Dr. Isidore Moos. Unfortunately the molecular studies by [employer] was negative for actionable mutations.  The initial tissue biopsy was insufficient for molecular studies by [facility]. The patient had repeat biopsy for the molecular studies and the expectation is to have the result on 05/05/2020. He started systemic chemotherapy with carboplatin for AUC of 5, Alimta 500 mg/M2 and Keytruda 200 mg IV every 3 weeks status post 6 cycles.  Starting from cycle #5 he is on maintenance treatment with Alimta and Keytruda.  He has been tolerating this treatment well with no concerning adverse effects. He had repeat CT scan of the  chest, abdomen pelvis performed recently.  I personally and independently reviewed the scan images and discussed the result and shared some of the images with the patient and his wife today.  His scan showed no concerning findings for disease progression and there was significant improvement from the initial scan. I recommended for the patient to continue his current treatment with maintenance Alimta and Keytruda and he will proceed  with cycle #7 today. For the depression, he will continue his treatment with Cymbalta. He will come back for follow-up visit in 3 weeks for evaluation before starting cycle #8. The patient was advised to call immediately if he has any concerning symptoms in the interval. Disclaimer: This note was dictated with voice recognition software. Similar sounding words can inadvertently be transcribed and may not be corrected upon review.

## 2020-08-11 NOTE — Patient Instructions (Signed)
[facility] Discharge Instructions for Patients Receiving Chemotherapy  Today you received the following chemotherapy agents: Keytruda, Alimta  To help prevent nausea and vomiting after your treatment, we encourage you to take your nausea medication as directed.    If you develop nausea and vomiting that is not controlled by your nausea medication, call the clinic.   BELOW ARE SYMPTOMS THAT SHOULD BE REPORTED IMMEDIATELY:  *FEVER GREATER THAN 100.5 F  *CHILLS WITH OR WITHOUT FEVER  NAUSEA AND VOMITING THAT IS NOT CONTROLLED WITH YOUR NAUSEA MEDICATION  *UNUSUAL SHORTNESS OF BREATH  *UNUSUAL BRUISING OR BLEEDING  TENDERNESS IN MOUTH AND THROAT WITH OR WITHOUT PRESENCE OF ULCERS  *URINARY PROBLEMS  *BOWEL PROBLEMS  UNUSUAL RASH Items with * indicate a potential emergency and should be followed up as soon as possible.  Feel free to call the clinic should you have any questions or concerns. The clinic phone number is [phone].  Please show the [facility] at check-in to the Emergency Department and triage nurse.

## 2020-08-11 NOTE — Progress Notes (Signed)
I spoke with patient and his wife today during clinic. [name] doing well without complaints.  No barriers noted at this time.

## 2020-08-12 ENCOUNTER — Encounter: Payer: Self-pay | Admitting: General Practice

## 2020-08-12 NOTE — Progress Notes (Signed)
[facility] Spiritual Care Note  Connected with wife [name] by phone for caregiver support per staff referral. Plan to follow up again next month, also by phone.   [place], [place], [facility] Pager [phone] Voicemail [phone]

## 2020-08-18 ENCOUNTER — Telehealth: Payer: Self-pay | Admitting: Radiation Therapy

## 2020-08-18 ENCOUNTER — Other Ambulatory Visit: Payer: Self-pay | Admitting: Radiation Therapy

## 2020-08-18 NOTE — Telephone Encounter (Signed)
Returned Ms. Wilmot's call regarding her concerns with [place] tolerating the upcoming brain MRI and a mental assessment.   Dr. Isidore Moos recommended that he try Afrin nasal spray to treat the sinus congestion. -- they can buy that OTC --- don't use for more than 3 days or else it will cause chronic nasal drip. [name] said that Elisandro will probably refuse to do this as he refused the mucinex recommended by Dr. Julien Nordmann.   I answered her questions about the upcoming MRI to help him better prepare for the procedure. I explained that the scan will be done using a head coil, which will be like placing a helmet on his head. He has ativan to take prior to the scan and was instructed to take this 30 min prior to his scheduled MRI appointment.   Regarding the mental assessment, Dr. Isidore Moos recommended they see Dr. Mickeal Skinner for this. [name] said that she is not sure Yamen will agree to see another physician, and wanted to know if Dr. Christella Noa could mention this during their upcoming follow-up to review the MRI results. She thinks that if it is coming from Dr. Christella Noa or a current provider, Caisen will be more apt to agree to it, but not from just her. I will reach out to Dr. Lacy Duverney secretary and request that he mention this during [facility] upcoming visit on 12/6. [name] even went as far to say that she would also be willing to do a mental assessment,  if this would help Harel not feel alone, and to agree to seeing Dr. Mickeal Skinner.   [name] was very happy with everything that was discussed and knows that she can call back with any other issues or concerns regarding Jontae.   Mont Dutton R.T.(R)(T) Radiation Special Procedures Navigator

## 2020-08-20 ENCOUNTER — Ambulatory Visit (HOSPITAL_COMMUNITY): Admission: RE | Admit: 2020-08-20 | Payer: Medicare PPO | Source: Ambulatory Visit

## 2020-08-21 DIAGNOSIS — N183 Chronic kidney disease, stage 3 unspecified: Secondary | ICD-10-CM | POA: Diagnosis not present

## 2020-08-21 DIAGNOSIS — H6121 Impacted cerumen, right ear: Secondary | ICD-10-CM | POA: Diagnosis not present

## 2020-08-21 DIAGNOSIS — I700 Atherosclerosis of aorta: Secondary | ICD-10-CM | POA: Diagnosis not present

## 2020-08-21 DIAGNOSIS — R634 Abnormal weight loss: Secondary | ICD-10-CM | POA: Diagnosis not present

## 2020-08-21 DIAGNOSIS — C3492 Malignant neoplasm of unspecified part of left bronchus or lung: Secondary | ICD-10-CM | POA: Diagnosis not present

## 2020-08-21 DIAGNOSIS — R339 Retention of urine, unspecified: Secondary | ICD-10-CM | POA: Diagnosis not present

## 2020-08-24 ENCOUNTER — Ambulatory Visit (HOSPITAL_COMMUNITY)
Admission: RE | Admit: 2020-08-24 | Discharge: 2020-08-24 | Disposition: A | Discharge status: Home / Self Care | Payer: Medicare PPO | Source: Ambulatory Visit | Attending: Radiation Oncology | Admitting: Radiation Oncology

## 2020-08-24 ENCOUNTER — Other Ambulatory Visit: Payer: Self-pay

## 2020-08-24 ENCOUNTER — Ambulatory Visit (HOSPITAL_COMMUNITY): Payer: Medicare PPO

## 2020-08-24 DIAGNOSIS — C7931 Secondary malignant neoplasm of brain: Secondary | ICD-10-CM | POA: Insufficient documentation

## 2020-08-24 DIAGNOSIS — C3490 Malignant neoplasm of unspecified part of unspecified bronchus or lung: Secondary | ICD-10-CM | POA: Diagnosis not present

## 2020-08-24 DIAGNOSIS — G9389 Other specified disorders of brain: Secondary | ICD-10-CM | POA: Diagnosis not present

## 2020-08-24 DIAGNOSIS — C7949 Secondary malignant neoplasm of other parts of nervous system: Secondary | ICD-10-CM | POA: Diagnosis not present

## 2020-08-24 DIAGNOSIS — G319 Degenerative disease of nervous system, unspecified: Secondary | ICD-10-CM | POA: Diagnosis not present

## 2020-08-24 IMAGING — MR MR HEAD WO/W CM
11 of 13 series · 21 of 48 positions shown · IV contrast (Yes GAD)
Comparison: [DATE]

CLINICAL DATA: Metastatic non-small cell lung cancer. Six posterior
fossa metastases treated with SRS on [DATE].

EXAM:
MRI HEAD WITHOUT AND WITH CONTRAST
TECHNIQUE: Multiplanar, multiecho pulse sequences of the brain and surrounding
structures were obtained without and with intravenous contrast.
CONTRAST:  6.5mL GADAVIST GADOBUTROL 1 MMOL/ML IV SOLN

[Series 2: FLAIR · sagittal · 3.0mm · 0.47mm/px · 1 of 36 slices shown (1 of 2)]
[im 1/36]
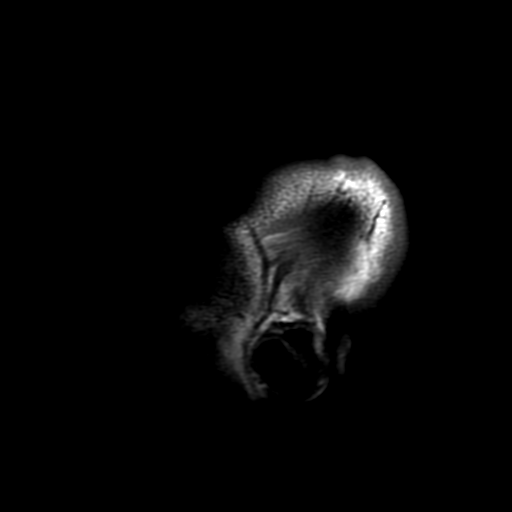

[Series 3: DWI · axial · 3.0mm · 0.94mm/px · z∈[-64,+104]mm · 2 of 116 slices shown]
[im 1/116]
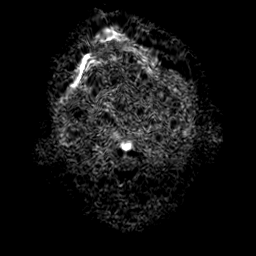
[im 116/116]
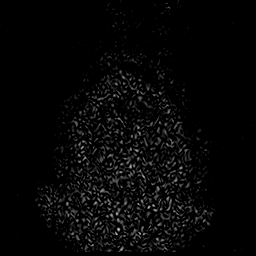

[Series 4: FLAIR · axial · 3.0mm · 0.47mm/px · 1 of 60 slices shown (2 of 2)]
[im 1/60]
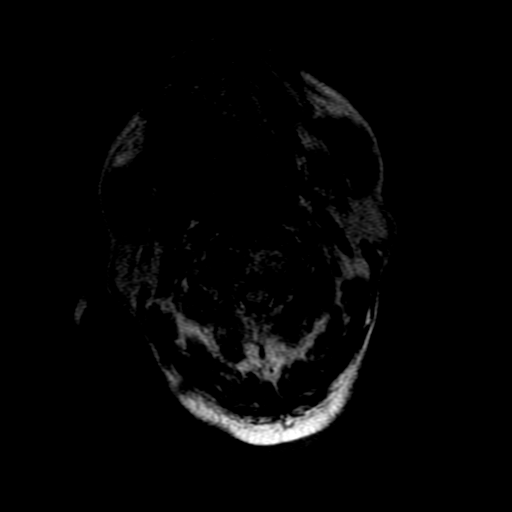

[Series 5: SWI · axial · 3.0mm · 0.47mm/px · z∈[-64,+106]mm · 3 of 116 slices shown]
[im 1/116]
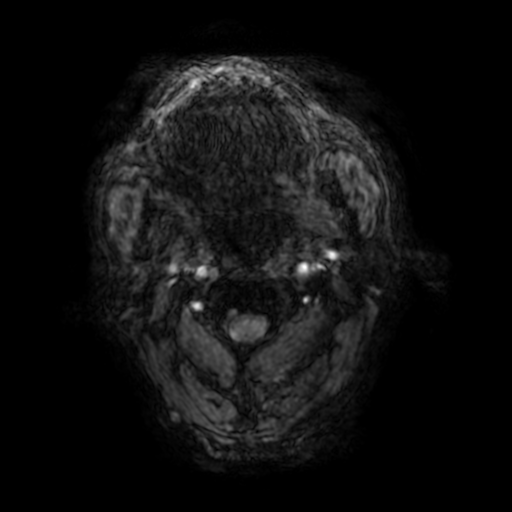
[im 58/116]
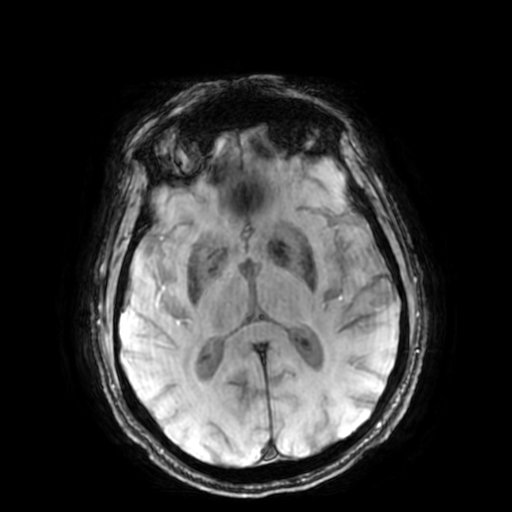
[im 116/116]
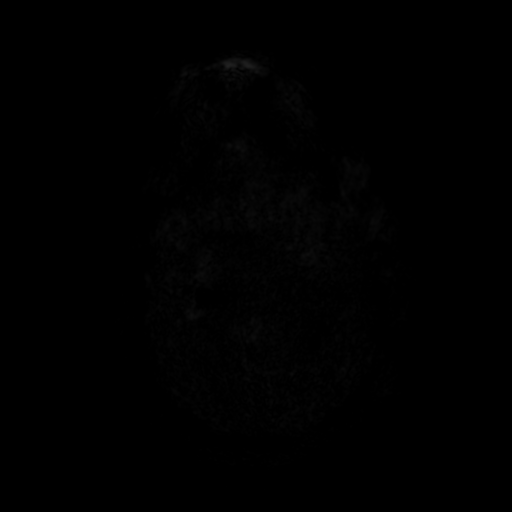

[Series 7: T2 post-contrast · coronal · 5.0mm · 0.39mm/px · 1 of 32 slices shown (1 of 2)]
[im 1/32]
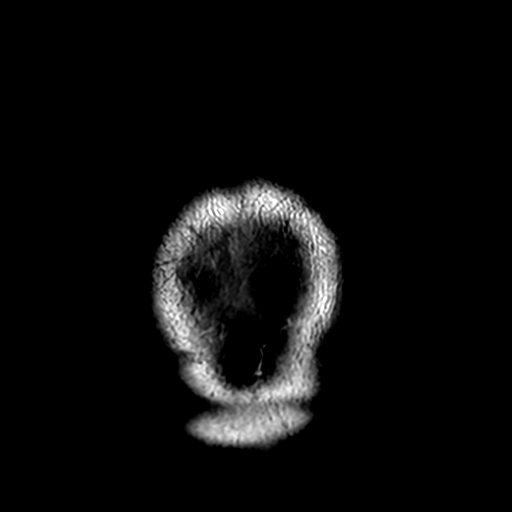

[Series 8: T1 · coronal · 5.0mm · 0.43mm/px · 1 of 32 slices shown]
[im 1/32]
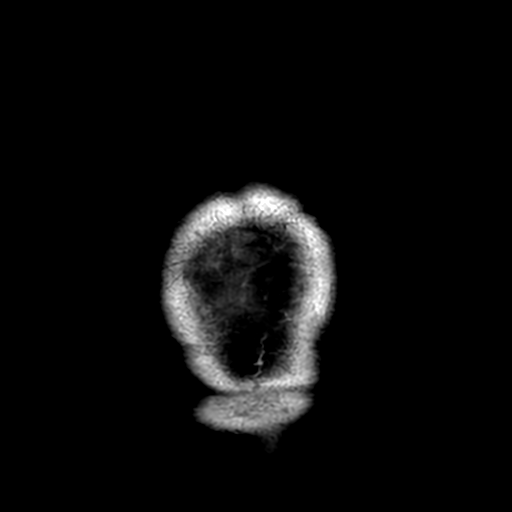

[Series 9: T2 post-contrast · axial · 5.0mm · 0.47mm/px · 1 of 30 slices shown (2 of 2)]
[im 1/30]
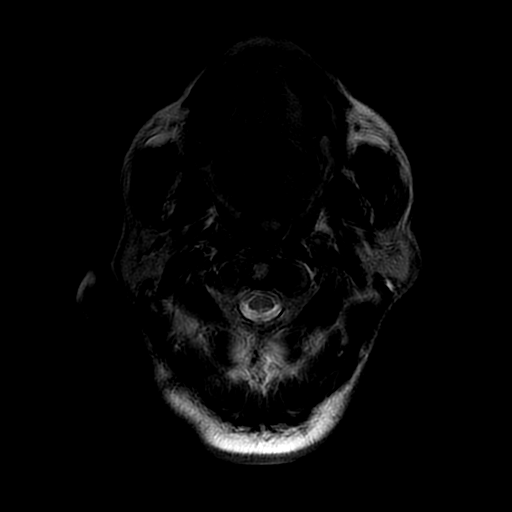

[Series 10: FLAIR post-contrast · sagittal · 3.0mm · 0.47mm/px · 1 of 36 slices shown]
[im 1/36]
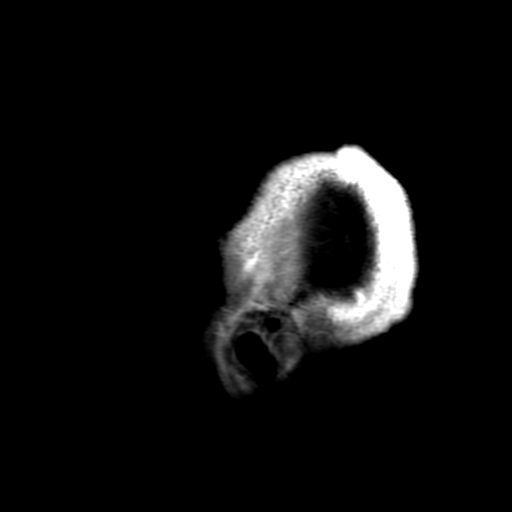

[Series 350: ADC · axial · 3.0mm · 0.94mm/px · 1 of 58 slices shown]
[im 1/58]
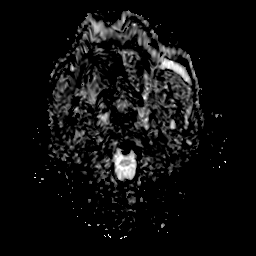

[Series 600: multiplanar reconstruction (mpr) · axial · 0.9mm · 0.50mm/px · z∈[-143,+112]mm · 8 of 301 slices shown (1 of 2)]
[im 1/301]
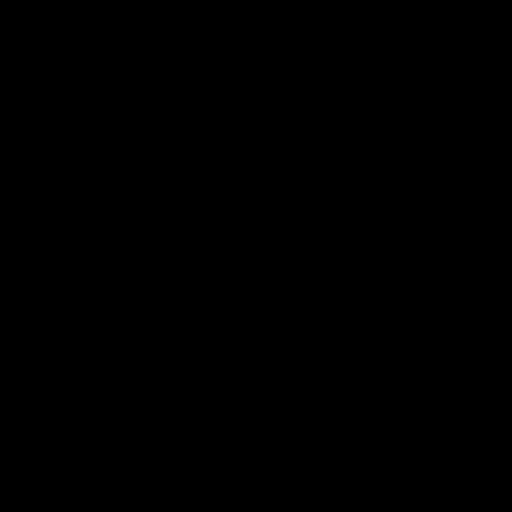
[im 43/301]
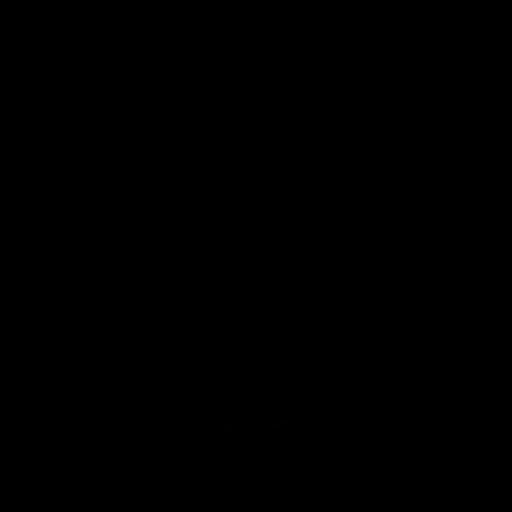
[im 86/301]
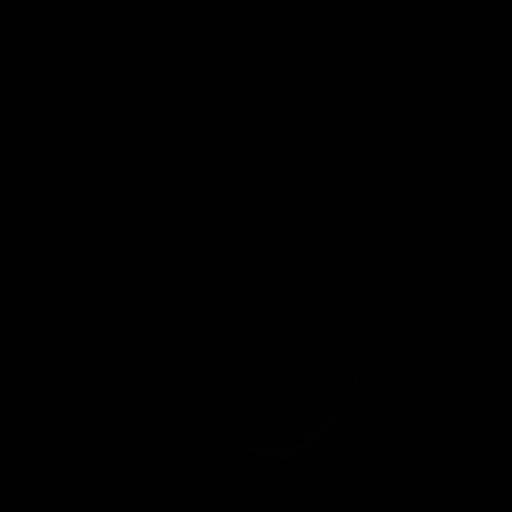
[im 129/301]
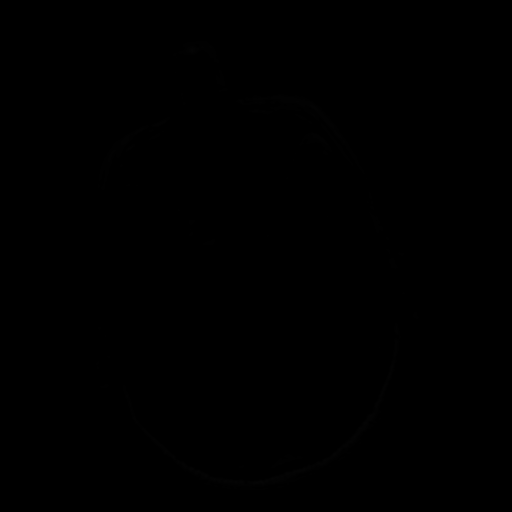
[im 172/301]
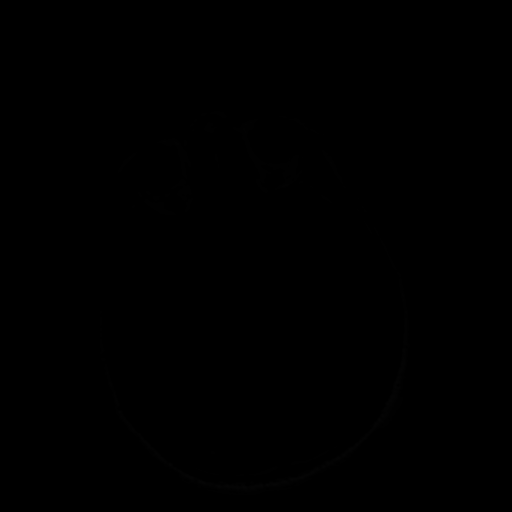
[im 215/301]
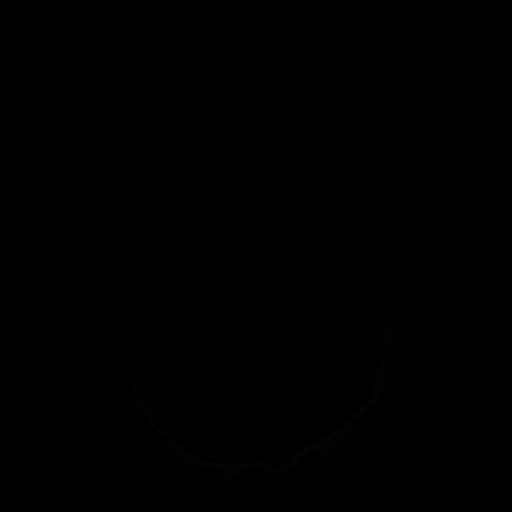
[im 258/301]
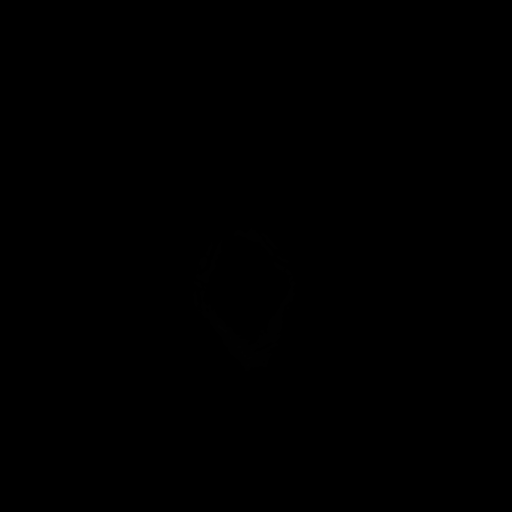
[im 301/301]
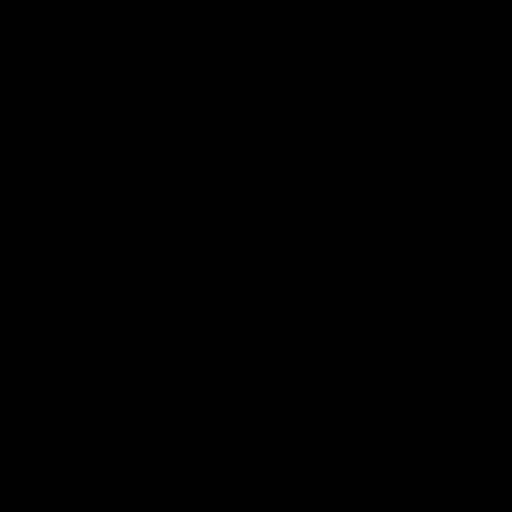

[Series 1100: multiplanar reconstruction (mpr) · axial · 0.9mm · 0.50mm/px · 1 of 301 slices shown (2 of 2)]
[im 1/301]
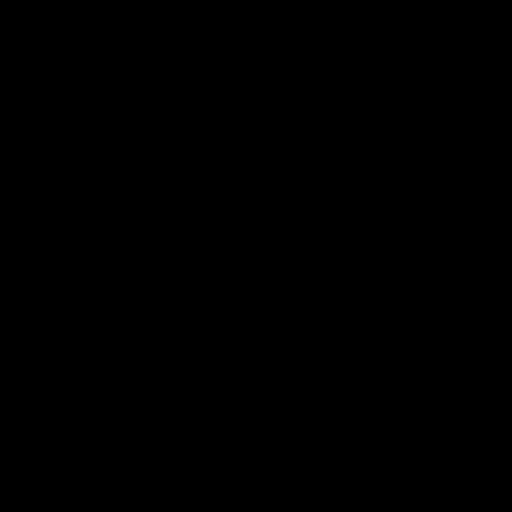

[21 of 48 positions shown; findings below may reference images not displayed]

FINDINGS: The study is moderately motion degraded including on postcontrast
imaging.

BRAIN

New Lesions: None.

Larger lesions: None.

Stable or Smaller lesions:

1. 6 mm left cerebellar lesion with new chronic blood products
(series [7Z], image 109, previously 8 mm).
2. Faintly visible 3 mm left cerebellar lesion anterior to #1
(series [7Z], image 110, previously 3 mm).
3. 2 mm right cerebellar lesion (series [7Z], image 122, previously
3 mm).
4. The other 3 small enhancing cerebellar lesions on the prior study
are not visible today, either decreased in size/resolved or obscured
by motion.

Other Brain findings: There is no edema associated with any of the
treated metastases. No acute infarct, midline shift, or extra-axial
fluid collection is identified. There is mild to moderate cerebral
atrophy. A 19 x 7 mm enhancing extra-axial mass laterally over the
right frontal convexity is unchanged, and there is no associated
brain edema or mass effect.

Vascular: Major intracranial vascular flow voids are preserved.

Skull and upper cervical spine: No suspicious marrow lesion.

Sinuses/Orbits: Unremarkable orbits. Paranasal sinuses and mastoid
air cells are clear.

Other: None.
IMPRESSION: 1. Motion degraded examination without evidence of new or
progressive intracranial metastatic disease.
2. Decreased size of treated cerebellar metastases.
3. Unchanged right frontal meningioma.

## 2020-08-24 MED ORDER — GADOBUTROL 1 MMOL/ML IV SOLN
6.5000 mL | Freq: Once | INTRAVENOUS | Status: AC | PRN
  Administered 2020-08-24: 6.5 mL via INTRAVENOUS

## 2020-08-24 NOTE — Progress Notes (Signed)
..  The following Medication: Beryle Flock has been approved thru [employer] as Risk manager. Re-enrollment period is 08/21/2020 to 09/18/2021.  Assistance ID: [id number]. Reason for Assistance: EOOP Next DOS: 09/01/2020

## 2020-08-25 DIAGNOSIS — C7931 Secondary malignant neoplasm of brain: Secondary | ICD-10-CM | POA: Diagnosis not present

## 2020-09-01 ENCOUNTER — Inpatient Hospital Stay: Payer: Medicare PPO | Attending: Internal Medicine

## 2020-09-01 ENCOUNTER — Other Ambulatory Visit: Payer: Self-pay

## 2020-09-01 ENCOUNTER — Inpatient Hospital Stay: Payer: Medicare PPO

## 2020-09-01 ENCOUNTER — Inpatient Hospital Stay: Payer: Medicare PPO | Admitting: Internal Medicine

## 2020-09-01 ENCOUNTER — Encounter: Payer: Self-pay | Admitting: Internal Medicine

## 2020-09-01 VITALS — BP 113/47 | HR 80 | Temp 98.1°F | Resp 18 | Ht 69.0 in | Wt 148.7 lb

## 2020-09-01 DIAGNOSIS — C7931 Secondary malignant neoplasm of brain: Secondary | ICD-10-CM

## 2020-09-01 DIAGNOSIS — F329 Major depressive disorder, single episode, unspecified: Secondary | ICD-10-CM | POA: Insufficient documentation

## 2020-09-01 DIAGNOSIS — Z5112 Encounter for antineoplastic immunotherapy: Secondary | ICD-10-CM | POA: Insufficient documentation

## 2020-09-01 DIAGNOSIS — C3490 Malignant neoplasm of unspecified part of unspecified bronchus or lung: Secondary | ICD-10-CM | POA: Diagnosis not present

## 2020-09-01 DIAGNOSIS — Z5111 Encounter for antineoplastic chemotherapy: Secondary | ICD-10-CM

## 2020-09-01 DIAGNOSIS — C3492 Malignant neoplasm of unspecified part of left bronchus or lung: Secondary | ICD-10-CM | POA: Diagnosis not present

## 2020-09-01 DIAGNOSIS — Z87891 Personal history of nicotine dependence: Secondary | ICD-10-CM | POA: Diagnosis not present

## 2020-09-01 DIAGNOSIS — C7951 Secondary malignant neoplasm of bone: Secondary | ICD-10-CM | POA: Insufficient documentation

## 2020-09-01 DIAGNOSIS — Z79899 Other long term (current) drug therapy: Secondary | ICD-10-CM | POA: Insufficient documentation

## 2020-09-01 LAB — CMP (CANCER CENTER ONLY)
ALT: 15 U/L (ref 0–44)
AST: 22 U/L (ref 15–41)
Albumin: 2.8 g/dL — ABNORMAL LOW (ref 3.5–5.0)
Alkaline Phosphatase: 103 U/L (ref 38–126)
Anion gap: 7 (ref 5–15)
BUN: 5 mg/dL — ABNORMAL LOW (ref 8–23)
CO2: 27 mmol/L (ref 22–32)
Calcium: 9.4 mg/dL (ref 8.9–10.3)
Chloride: 106 mmol/L (ref 98–111)
Creatinine: 0.98 mg/dL (ref 0.61–1.24)
GFR, Estimated: >60 mL/min (ref >60)
Glucose, Bld: 148 mg/dL — ABNORMAL HIGH (ref 70–99)
Potassium: 3.7 mmol/L (ref 3.5–5.1)
Sodium: 140 mmol/L (ref 135–145)
Total Bilirubin: 0.5 mg/dL (ref 0.3–1.2)
Total Protein: 6.8 g/dL (ref 6.5–8.1)

## 2020-09-01 LAB — CBC WITH DIFFERENTIAL (CANCER CENTER ONLY)
Abs Immature Granulocytes: 0.02 10*3/uL (ref 0.00–0.07)
Basophils Absolute: 0 10*3/uL (ref 0.0–0.1)
Basophils Relative: 1 %
Eosinophils Absolute: 0.1 10*3/uL (ref 0.0–0.5)
Eosinophils Relative: 3 %
HCT: 33.8 % — ABNORMAL LOW (ref 39.0–52.0)
Hemoglobin: 10.5 g/dL — ABNORMAL LOW (ref 13.0–17.0)
Immature Granulocytes: 1 %
Lymphocytes Relative: 22 %
Lymphs Abs: 0.9 10*3/uL (ref 0.7–4.0)
MCH: 31.5 pg (ref 26.0–34.0)
MCHC: 31.1 g/dL (ref 30.0–36.0)
MCV: 101.5 fL — ABNORMAL HIGH (ref 80.0–100.0)
Monocytes Absolute: 0.5 10*3/uL (ref 0.1–1.0)
Monocytes Relative: 11 %
Neutro Abs: 2.7 10*3/uL (ref 1.7–7.7)
Neutrophils Relative %: 62 %
Platelet Count: 238 10*3/uL (ref 150–400)
RBC: 3.33 MIL/uL — ABNORMAL LOW (ref 4.22–5.81)
RDW: 16.5 % — ABNORMAL HIGH (ref 11.5–15.5)
WBC Count: 4.3 10*3/uL (ref 4.0–10.5)
nRBC: 0 % (ref 0.0–0.2)

## 2020-09-01 LAB — TSH: TSH: 0.747 u[IU]/mL (ref 0.320–4.118)

## 2020-09-01 MED ORDER — PROCHLORPERAZINE MALEATE 10 MG PO TABS
ORAL_TABLET | ORAL | Status: AC
  Filled 2020-09-01: qty 1

## 2020-09-01 MED ORDER — SODIUM CHLORIDE 0.9 % IV SOLN
Freq: Once | INTRAVENOUS | Status: AC
  Filled 2020-09-01: qty 250

## 2020-09-01 MED ORDER — SODIUM CHLORIDE 0.9 % IV SOLN
200.0000 mg | Freq: Once | INTRAVENOUS | Status: AC
  Administered 2020-09-01: 200 mg via INTRAVENOUS
  Filled 2020-09-01: qty 8

## 2020-09-01 MED ORDER — PROCHLORPERAZINE MALEATE 10 MG PO TABS: 10.0000 mg | ORAL_TABLET | Freq: Once | ORAL | Status: DC

## 2020-09-01 MED ORDER — SODIUM CHLORIDE 0.9 % IV SOLN
500.0000 mg/m2 | Freq: Once | INTRAVENOUS | Status: AC
  Administered 2020-09-01: 900 mg via INTRAVENOUS
  Filled 2020-09-01: qty 20

## 2020-09-01 NOTE — Progress Notes (Signed)
[facility] [phone]   [phone]  OFFICE PROGRESS NOTE  Seward Carol, MD [address] [place] [address]  DIAGNOSIS: Stage IV (T4, N3, M1c) non-small cell lung cancer, adenocarcinoma presented with left upper lobe lung mass in addition to bilateral mediastinal lymphadenopathy as well as bilateral pulmonary nodules and metastatic disease to the bones and brain diagnosed in July 2021.  Molecular studies by [employer] showed no actionable mutations.  PRIOR THERAPY: None  CURRENT THERAPY: Systemic chemotherapy with carboplatin for AUC of 5, Alimta 500 mg/M2 and Keytruda 200 mg IV every 3 weeks.  First dose 04/06/2020.  Status post 7 cycles.  Starting from cycle #5 he is on maintenance treatment with Alimta and Keytruda every 3 weeks.  INTERVAL HISTORY: Phillip Franklin 79 y.o. male returns to the clinic today for follow-up visit accompanied by his wife.  The patient is feeling fine today with no concerning complaints.  He denied having any chest pain, shortness of breath, cough or hemoptysis.  He denied having any nausea, vomiting, diarrhea or constipation.  He has no headache or visual changes.  He continues to tolerate his treatment with maintenance Alimta and Keytruda fairly well.  [name] here today for evaluation before starting cycle #8.   MEDICAL HISTORY: Past Medical History:  Diagnosis Date  . Depression   . GERD (gastroesophageal reflux disease)    occasional, diet controlled  . High cholesterol   . Hypertension    no longer taking medication - states it's undercontrol  . Hypothyroid     ALLERGIES:  is allergic to codeine, hydrocodone, oxycodone, and penicillins.  MEDICATIONS:  Current Outpatient Medications  Medication Sig Dispense Refill  . acetaminophen (TYLENOL) 325 MG tablet Take 325-650 mg by mouth every 6 (six) hours as needed for moderate pain or headache.    Marland Kitchen amoxicillin-clavulanate (AUGMENTIN)  875-125 MG tablet Take 1 tablet by mouth 2 (two) times daily.    Marland Kitchen atorvastatin (LIPITOR) 20 MG tablet Take 20 mg by mouth daily.    . benzonatate (TESSALON) 200 MG capsule Take 1 capsule (200 mg total) by mouth 4 (four) times daily as needed for cough. 60 capsule 1  . citalopram (CELEXA) 20 MG tablet Take 1 tablet (20 mg total) by mouth daily. 30 tablet 3  . dronabinol (MARINOL) 2.5 MG capsule Take 1 capsule (2.5 mg total) by mouth 2 (two) times daily before a meal. (Patient taking differently: Take 2.5 mg by mouth 2 (two) times daily before a meal. Patient is only taking once a day) 60 capsule 2  . esomeprazole (NEXIUM) 20 MG capsule Take 20 mg by mouth daily as needed (acid reflux).     . folic acid (FOLVITE) 1 MG tablet Take 1 tablet by mouth once daily 30 tablet 0  . HYDROmorphone (DILAUDID) 2 MG tablet Take 2 mg by mouth 4 (four) times daily.    Marland Kitchen levothyroxine (SYNTHROID) 88 MCG tablet Take 88 mcg by mouth daily before breakfast.    . LORazepam (ATIVAN) 0.5 MG tablet Take 1-2 tablets by mouth 30 minutes before procedures, PRN anxiety. 8 tablet 0  . Multiple Vitamins-Minerals (CENTRUM SILVER 50+MEN) TABS Take 1 tablet by mouth daily.    Marland Kitchen neomycin-polymyxin b-dexamethasone (MAXITROL) 3.5-10000-0.1 SUSP Place 1 drop into the right eye every 8 (eight) hours.    . potassium chloride SA (KLOR-CON) 20 MEQ tablet Take 1 tablet (20 mEq total) by mouth daily. 6 tablet 0  .  prochlorperazine (COMPAZINE) 10 MG tablet Take 1 tablet (10 mg total) by mouth every 6 (six) hours as needed for nausea or vomiting. 30 tablet 0  . sulfamethoxazole-trimethoprim (BACTRIM DS) 800-160 MG tablet Take 1 tablet by mouth 2 (two) times daily. 14 tablet 0  . tamsulosin (FLOMAX) 0.4 MG CAPS capsule Take 0.4 mg by mouth every evening.     . traMADol (ULTRAM) 50 MG tablet Take 1 tablet (50 mg total) by mouth every 6 (six) hours as needed. 30 tablet 0   No current facility-administered medications for this visit.     SURGICAL HISTORY:  Past Surgical History:  Procedure Laterality Date  . BRONCHIAL NEEDLE ASPIRATION BIOPSY  03/24/2020   Procedure: BRONCHIAL NEEDLE ASPIRATION BIOPSIES;  Surgeon: Candee Furbish, MD;  Location: [facility] ENDOSCOPY;  Service: Pulmonary;;  . HEMORRHOID SURGERY    . VIDEO BRONCHOSCOPY WITH ENDOBRONCHIAL ULTRASOUND N/A 03/24/2020   Procedure: VIDEO BRONCHOSCOPY WITH ENDOBRONCHIAL ULTRASOUND;  Surgeon: Candee Furbish, MD;  Location: [facility] ENDOSCOPY;  Service: Pulmonary;  Laterality: N/A;    REVIEW OF SYSTEMS:  A comprehensive review of systems was negative except for: Constitutional: positive for fatigue   PHYSICAL EXAMINATION: General appearance: alert, cooperative, fatigued and no distress Head: Normocephalic, without obvious abnormality, atraumatic Neck: no adenopathy, no JVD, supple, symmetrical, trachea midline and thyroid not enlarged, symmetric, no tenderness/mass/nodules Lymph nodes: Cervical, supraclavicular, and axillary nodes normal. Resp: clear to auscultation bilaterally Back: symmetric, no curvature. ROM normal. No CVA tenderness. Cardio: regular rate and rhythm, S1, S2 normal, no murmur, click, rub or gallop GI: soft, non-tender; bowel sounds normal; no masses,  no organomegaly Extremities: extremities normal, atraumatic, no cyanosis or edema  ECOG PERFORMANCE STATUS: 1 - Symptomatic but completely ambulatory  Blood pressure (!) 113/47, pulse 80, temperature 98.1 F (36.7 C), temperature source Tympanic, resp. rate 18, height 5\' 9"  (1.753 m), weight 148 lb 11.2 oz (67.4 kg), SpO2 99 %.  LABORATORY DATA: Lab Results  Component Value Date   WBC 4.3 09/01/2020   HGB 10.5 (L) 09/01/2020   HCT 33.8 (L) 09/01/2020   MCV 101.5 (H) 09/01/2020   PLT 238 09/01/2020      Chemistry      Component Value Date/Time   NA 140 09/01/2020 1318   K 3.7 09/01/2020 1318   CL 106 09/01/2020 1318   CO2 27 09/01/2020 1318   BUN 5 (L) 09/01/2020 1318   CREATININE 0.98  09/01/2020 1318      Component Value Date/Time   CALCIUM 9.4 09/01/2020 1318   ALKPHOS 103 09/01/2020 1318   AST 22 09/01/2020 1318   ALT 15 09/01/2020 1318   BILITOT 0.5 09/01/2020 1318       RADIOGRAPHIC STUDIES: CT Chest W Contrast  Result Date: 08/07/2020 CLINICAL DATA:  Stage IV non-small cell lung cancer. Left upper lobe lung mass with bilateral mediastinal adenopathy and pulmonary metastasis. Diagnosed 04/21/2020. Status post IV chemotherapy with oral chemotherapy ongoing. Evaluate treatment response. EXAM: CT CHEST, ABDOMEN, AND PELVIS WITH CONTRAST TECHNIQUE: Multidetector CT imaging of the chest, abdomen and pelvis was performed following the standard protocol during bolus administration of intravenous contrast. CONTRAST:  116mL OMNIPAQUE IOHEXOL 300 MG/ML  SOLN COMPARISON:  06/04/2020 the PET of 03/20/2020 FINDINGS: CT CHEST FINDINGS Cardiovascular: Aortic atherosclerosis. Normal heart size, without pericardial effusion. Multivessel coronary artery atherosclerosis. No central pulmonary embolism, on this non-dedicated study. Mediastinum/Nodes: No supraclavicular adenopathy. Right paratracheal node measures 8 mm on 15/2 versus 10 mm on the prior (when remeasured). Subcarinal node  measures 1.3 cm on 29/2 versus 1.6 cm on the prior exam (when remeasured). Periesophageal 9 mm node on 43/2 is decreased from 10 mm on the prior exam (when remeasured). Lungs/Pleura: Left-sided pleural thickening is similar. Left apical lung mass measures 3.4 x 2.6 cm on 26/4. When remeasured in a similar fashion at the same level on the prior, 3.5 x 2.9 cm on 20/4. Innumerable bilateral pulmonary nodules, consistent with metastatic disease. An index nodule along the right minor fissure measures 1.2 x 1.1 cm on 78/4. Compare 1.4 x 1.3 cm on the prior. Index pleural-based left lower lobe pulmonary nodule measures 1.4 cm on 106/4. Compare 1.2 cm on the prior exam (when remeasured). More cephalad left lower lobe  pulmonary nodule measures 9 mm on 61/4 and is similar to on the prior. Inferior right upper lobe 11 x 10 mm nodule on 44/4 is similar to 12 x 8 mm on the prior exam. Musculoskeletal: Extensive osseous metastasis. Some lesions of undergone interval sclerosis indicative of healing. Example within the posterior eighth left rib on 31/2. CT ABDOMEN PELVIS FINDINGS Hepatobiliary: Normal liver. Gallbladder mucosal hyperenhancement without calcified stone or surrounding edema. No biliary duct dilatation. Pancreas: Normal, without mass or ductal dilatation. Spleen: Normal in size, without focal abnormality. Adrenals/Urinary Tract: Normal adrenal glands. Upper pole left renal 1.4 cm cyst. Significant impression of the median lobe prostate into the urinary bladder. Stomach/Bowel: Gastric antral underdistention. Normal colon, appendix, and terminal ileum. Normal small bowel. Vascular/Lymphatic: Aortic atherosclerosis. No abdominal adenopathy. Mildly prominent bilateral inguinal nodes are not pathologic by size criteria and likely reactive. Reproductive: Moderate prostatomegaly. Other: No significant free fluid. Tiny fat containing left inguinal hernia. No evidence of omental or peritoneal disease. Musculoskeletal: Right-sided L1 lesion of 1.2 cm on 61/2 is minimally decreased in size compared to 1.4 cm on the prior. More well-defined and sclerotic today. Mild convex left lumbar spine curvature. IMPRESSION: 1. Similar to minimal improvement in left apical lung mass. 2. Similar pulmonary metastasis. 3. Mild improvement in thoracic adenopathy/nodal metastasis. 4. No findings of abdominopelvic soft tissue metastasis. 5. Osseous metastasis, with mild interval response to therapy as evidenced by increase in sclerosis. 6. Prostatomegaly. 7. Nonspecific gallbladder mucosal hyperenhancement. Correlate with right upper quadrant symptoms. Chronic cholecystitis could have this appearance. No surrounding edema to suggest acute inflammation.  Electronically Signed   By: Abigail Miyamoto M.D.   On: 08/07/2020 16:12   MR Brain W Wo Contrast  Result Date: 08/25/2020 CLINICAL DATA:  Metastatic non-small cell lung cancer. Six posterior fossa metastases treated with SRS on 05/04/2020. EXAM: MRI HEAD WITHOUT AND WITH CONTRAST TECHNIQUE: Multiplanar, multiecho pulse sequences of the brain and surrounding structures were obtained without and with intravenous contrast. CONTRAST:  6.103mL GADAVIST GADOBUTROL 1 MMOL/ML IV SOLN COMPARISON:  04/23/2020 FINDINGS: The study is moderately motion degraded including on postcontrast imaging. BRAIN New Lesions: None. Larger lesions: None. Stable or Smaller lesions: 1. 6 mm left cerebellar lesion with new chronic blood products (series 1100, image 109, previously 8 mm). 2. Faintly visible 3 mm left cerebellar lesion anterior to #1 (series 1100, image 110, previously 3 mm). 3. 2 mm right cerebellar lesion (series 1100, image 122, previously 3 mm). 4. The other 3 small enhancing cerebellar lesions on the prior study are not visible today, either decreased in size/resolved or obscured by motion. Other Brain findings: There is no edema associated with any of the treated metastases. No acute infarct, midline shift, or extra-axial fluid collection is identified. There is mild  to moderate cerebral atrophy. A 19 x 7 mm enhancing extra-axial mass laterally over the right frontal convexity is unchanged, and there is no associated brain edema or mass effect. Vascular: Major intracranial vascular flow voids are preserved. Skull and upper cervical spine: No suspicious marrow lesion. Sinuses/Orbits: Unremarkable orbits. Paranasal sinuses and mastoid air cells are clear. Other: None. IMPRESSION: 1. Motion degraded examination without evidence of new or progressive intracranial metastatic disease. 2. Decreased size of treated cerebellar metastases. 3. Unchanged right frontal meningioma. Electronically Signed   By: Logan Bores M.D.   On:  08/25/2020 09:14   CT Abdomen Pelvis W Contrast  Result Date: 08/07/2020 CLINICAL DATA:  Stage IV non-small cell lung cancer. Left upper lobe lung mass with bilateral mediastinal adenopathy and pulmonary metastasis. Diagnosed 04/21/2020. Status post IV chemotherapy with oral chemotherapy ongoing. Evaluate treatment response. EXAM: CT CHEST, ABDOMEN, AND PELVIS WITH CONTRAST TECHNIQUE: Multidetector CT imaging of the chest, abdomen and pelvis was performed following the standard protocol during bolus administration of intravenous contrast. CONTRAST:  159mL OMNIPAQUE IOHEXOL 300 MG/ML  SOLN COMPARISON:  06/04/2020 the PET of 03/20/2020 FINDINGS: CT CHEST FINDINGS Cardiovascular: Aortic atherosclerosis. Normal heart size, without pericardial effusion. Multivessel coronary artery atherosclerosis. No central pulmonary embolism, on this non-dedicated study. Mediastinum/Nodes: No supraclavicular adenopathy. Right paratracheal node measures 8 mm on 15/2 versus 10 mm on the prior (when remeasured). Subcarinal node measures 1.3 cm on 29/2 versus 1.6 cm on the prior exam (when remeasured). Periesophageal 9 mm node on 43/2 is decreased from 10 mm on the prior exam (when remeasured). Lungs/Pleura: Left-sided pleural thickening is similar. Left apical lung mass measures 3.4 x 2.6 cm on 26/4. When remeasured in a similar fashion at the same level on the prior, 3.5 x 2.9 cm on 20/4. Innumerable bilateral pulmonary nodules, consistent with metastatic disease. An index nodule along the right minor fissure measures 1.2 x 1.1 cm on 78/4. Compare 1.4 x 1.3 cm on the prior. Index pleural-based left lower lobe pulmonary nodule measures 1.4 cm on 106/4. Compare 1.2 cm on the prior exam (when remeasured). More cephalad left lower lobe pulmonary nodule measures 9 mm on 61/4 and is similar to on the prior. Inferior right upper lobe 11 x 10 mm nodule on 44/4 is similar to 12 x 8 mm on the prior exam. Musculoskeletal: Extensive osseous  metastasis. Some lesions of undergone interval sclerosis indicative of healing. Example within the posterior eighth left rib on 31/2. CT ABDOMEN PELVIS FINDINGS Hepatobiliary: Normal liver. Gallbladder mucosal hyperenhancement without calcified stone or surrounding edema. No biliary duct dilatation. Pancreas: Normal, without mass or ductal dilatation. Spleen: Normal in size, without focal abnormality. Adrenals/Urinary Tract: Normal adrenal glands. Upper pole left renal 1.4 cm cyst. Significant impression of the median lobe prostate into the urinary bladder. Stomach/Bowel: Gastric antral underdistention. Normal colon, appendix, and terminal ileum. Normal small bowel. Vascular/Lymphatic: Aortic atherosclerosis. No abdominal adenopathy. Mildly prominent bilateral inguinal nodes are not pathologic by size criteria and likely reactive. Reproductive: Moderate prostatomegaly. Other: No significant free fluid. Tiny fat containing left inguinal hernia. No evidence of omental or peritoneal disease. Musculoskeletal: Right-sided L1 lesion of 1.2 cm on 61/2 is minimally decreased in size compared to 1.4 cm on the prior. More well-defined and sclerotic today. Mild convex left lumbar spine curvature. IMPRESSION: 1. Similar to minimal improvement in left apical lung mass. 2. Similar pulmonary metastasis. 3. Mild improvement in thoracic adenopathy/nodal metastasis. 4. No findings of abdominopelvic soft tissue metastasis. 5. Osseous metastasis, with mild  interval response to therapy as evidenced by increase in sclerosis. 6. Prostatomegaly. 7. Nonspecific gallbladder mucosal hyperenhancement. Correlate with right upper quadrant symptoms. Chronic cholecystitis could have this appearance. No surrounding edema to suggest acute inflammation. Electronically Signed   By: Abigail Miyamoto M.D.   On: 08/07/2020 16:12    ASSESSMENT AND PLAN: This is a very pleasant 79 years old white male recently diagnosed with a stage IV (T4, N3, M1C)  non-small cell lung cancer, adenocarcinoma presented with left upper lobe lung mass in addition to bilateral pulmonary nodules as well as bilateral hilar and mediastinal lymphadenopathy and metastatic disease to the bones and brain diagnosed in July 2021. He underwent stereotactic radiotherapy to the metastatic brain lesion under the care of Dr. Isidore Moos. Unfortunately the molecular studies by [employer] was negative for actionable mutations.  The initial tissue biopsy was insufficient for molecular studies by [facility]. The patient had repeat biopsy for the molecular studies and the expectation is to have the result on 05/05/2020. He started systemic chemotherapy with carboplatin for AUC of 5, Alimta 500 mg/M2 and Keytruda 200 mg IV every 3 weeks status post 7 cycles.  Starting from cycle #5 he is on maintenance treatment with Alimta and Keytruda.   The patient continues to tolerate his treatment with maintenance Alimta and Keytruda fairly well.  I recommended for him to proceed with cycle #8 today as planned. I will see him back for follow-up visit in 3 weeks for evaluation before starting cycle #9. For the depression, he will continue his treatment with Cymbalta. He was advised to call immediately if he has any other concerning symptoms in the interval. Disclaimer: This note was dictated with voice recognition software. Similar sounding words can inadvertently be transcribed and may not be corrected upon review.

## 2020-09-01 NOTE — Patient Instructions (Signed)
[facility] Discharge Instructions for Patients Receiving Chemotherapy  Today you received the following chemotherapy agents: Keytruda, Alimta  To help prevent nausea and vomiting after your treatment, we encourage you to take your nausea medication as directed.    If you develop nausea and vomiting that is not controlled by your nausea medication, call the clinic.   BELOW ARE SYMPTOMS THAT SHOULD BE REPORTED IMMEDIATELY:  *FEVER GREATER THAN 100.5 F  *CHILLS WITH OR WITHOUT FEVER  NAUSEA AND VOMITING THAT IS NOT CONTROLLED WITH YOUR NAUSEA MEDICATION  *UNUSUAL SHORTNESS OF BREATH  *UNUSUAL BRUISING OR BLEEDING  TENDERNESS IN MOUTH AND THROAT WITH OR WITHOUT PRESENCE OF ULCERS  *URINARY PROBLEMS  *BOWEL PROBLEMS  UNUSUAL RASH Items with * indicate a potential emergency and should be followed up as soon as possible.  Feel free to call the clinic should you have any questions or concerns. The clinic phone number is [phone].  Please show the [facility] at check-in to the Emergency Department and triage nurse.

## 2020-09-01 NOTE — Progress Notes (Signed)
Patient discharged in stable condition with no complaints 

## 2020-09-04 ENCOUNTER — Telehealth: Payer: Self-pay | Admitting: Internal Medicine

## 2020-09-04 NOTE — Telephone Encounter (Signed)
R/s 1/4 appt per provider on call schedule. Called and left msg. Mailed printout

## 2020-09-04 NOTE — Telephone Encounter (Signed)
Scheduled per los. Called and left msg. Mailed printout  °

## 2020-09-08 ENCOUNTER — Other Ambulatory Visit: Payer: Self-pay | Admitting: Radiation Therapy

## 2020-09-08 DIAGNOSIS — C7949 Secondary malignant neoplasm of other parts of nervous system: Secondary | ICD-10-CM

## 2020-09-08 DIAGNOSIS — C7931 Secondary malignant neoplasm of brain: Secondary | ICD-10-CM

## 2020-09-09 ENCOUNTER — Telehealth: Payer: Self-pay | Admitting: Internal Medicine

## 2020-09-09 NOTE — Telephone Encounter (Signed)
Release: [id number] Faxed medical records to [facility] @ fax [phone]

## 2020-09-21 ENCOUNTER — Inpatient Hospital Stay: Payer: Medicare PPO | Attending: Internal Medicine

## 2020-09-21 ENCOUNTER — Other Ambulatory Visit: Payer: Self-pay

## 2020-09-21 ENCOUNTER — Inpatient Hospital Stay: Payer: Medicare PPO

## 2020-09-21 ENCOUNTER — Inpatient Hospital Stay (HOSPITAL_BASED_OUTPATIENT_CLINIC_OR_DEPARTMENT_OTHER): Payer: Medicare PPO | Admitting: Internal Medicine

## 2020-09-21 ENCOUNTER — Other Ambulatory Visit: Payer: Self-pay | Admitting: Internal Medicine

## 2020-09-21 VITALS — BP 130/60 | HR 79 | Temp 97.9°F | Resp 17 | Ht 69.0 in | Wt 147.6 lb

## 2020-09-21 DIAGNOSIS — F32A Depression, unspecified: Secondary | ICD-10-CM | POA: Diagnosis not present

## 2020-09-21 DIAGNOSIS — C7931 Secondary malignant neoplasm of brain: Secondary | ICD-10-CM | POA: Diagnosis not present

## 2020-09-21 DIAGNOSIS — C3490 Malignant neoplasm of unspecified part of unspecified bronchus or lung: Secondary | ICD-10-CM

## 2020-09-21 DIAGNOSIS — Z79899 Other long term (current) drug therapy: Secondary | ICD-10-CM | POA: Insufficient documentation

## 2020-09-21 DIAGNOSIS — C7951 Secondary malignant neoplasm of bone: Secondary | ICD-10-CM | POA: Insufficient documentation

## 2020-09-21 DIAGNOSIS — C3492 Malignant neoplasm of unspecified part of left bronchus or lung: Secondary | ICD-10-CM

## 2020-09-21 DIAGNOSIS — Z87891 Personal history of nicotine dependence: Secondary | ICD-10-CM | POA: Insufficient documentation

## 2020-09-21 DIAGNOSIS — Z5111 Encounter for antineoplastic chemotherapy: Secondary | ICD-10-CM | POA: Diagnosis not present

## 2020-09-21 DIAGNOSIS — Z5112 Encounter for antineoplastic immunotherapy: Secondary | ICD-10-CM

## 2020-09-21 DIAGNOSIS — F329 Major depressive disorder, single episode, unspecified: Secondary | ICD-10-CM | POA: Diagnosis not present

## 2020-09-21 DIAGNOSIS — R5383 Other fatigue: Secondary | ICD-10-CM | POA: Diagnosis not present

## 2020-09-21 LAB — CBC WITH DIFFERENTIAL (CANCER CENTER ONLY)
Abs Immature Granulocytes: 0.02 10*3/uL (ref 0.00–0.07)
Basophils Absolute: 0 10*3/uL (ref 0.0–0.1)
Basophils Relative: 0 %
Eosinophils Absolute: 0.1 10*3/uL (ref 0.0–0.5)
Eosinophils Relative: 2 %
HCT: 33.6 % — ABNORMAL LOW (ref 39.0–52.0)
Hemoglobin: 10.9 g/dL — ABNORMAL LOW (ref 13.0–17.0)
Immature Granulocytes: 0 %
Lymphocytes Relative: 22 %
Lymphs Abs: 1.2 10*3/uL (ref 0.7–4.0)
MCH: 31.4 pg (ref 26.0–34.0)
MCHC: 32.4 g/dL (ref 30.0–36.0)
MCV: 96.8 fL (ref 80.0–100.0)
Monocytes Absolute: 0.7 10*3/uL (ref 0.1–1.0)
Monocytes Relative: 13 %
Neutro Abs: 3.4 10*3/uL (ref 1.7–7.7)
Neutrophils Relative %: 63 %
Platelet Count: 250 10*3/uL (ref 150–400)
RBC: 3.47 MIL/uL — ABNORMAL LOW (ref 4.22–5.81)
RDW: 16 % — ABNORMAL HIGH (ref 11.5–15.5)
WBC Count: 5.4 10*3/uL (ref 4.0–10.5)
nRBC: 0 % (ref 0.0–0.2)

## 2020-09-21 LAB — CMP (CANCER CENTER ONLY)
ALT: 13 U/L (ref 0–44)
AST: 22 U/L (ref 15–41)
Albumin: 3 g/dL — ABNORMAL LOW (ref 3.5–5.0)
Alkaline Phosphatase: 104 U/L (ref 38–126)
Anion gap: 5 (ref 5–15)
BUN: 7 mg/dL — ABNORMAL LOW (ref 8–23)
CO2: 27 mmol/L (ref 22–32)
Calcium: 9.6 mg/dL (ref 8.9–10.3)
Chloride: 106 mmol/L (ref 98–111)
Creatinine: 0.96 mg/dL (ref 0.61–1.24)
GFR, Estimated: >60 mL/min (ref >60)
Glucose, Bld: 114 mg/dL — ABNORMAL HIGH (ref 70–99)
Potassium: 3.5 mmol/L (ref 3.5–5.1)
Sodium: 138 mmol/L (ref 135–145)
Total Bilirubin: 0.5 mg/dL (ref 0.3–1.2)
Total Protein: 7.3 g/dL (ref 6.5–8.1)

## 2020-09-21 LAB — TSH: TSH: 1.165 u[IU]/mL (ref 0.320–4.118)

## 2020-09-21 MED ORDER — SODIUM CHLORIDE 0.9 % IV SOLN
Freq: Once | INTRAVENOUS | Status: DC
  Filled 2020-09-21: qty 250

## 2020-09-21 MED ORDER — PROCHLORPERAZINE MALEATE 10 MG PO TABS
ORAL_TABLET | ORAL | Status: AC
  Filled 2020-09-21: qty 1

## 2020-09-21 MED ORDER — PROCHLORPERAZINE MALEATE 10 MG PO TABS
10.0000 mg | ORAL_TABLET | Freq: Once | ORAL | Status: AC
  Administered 2020-09-21: 10 mg via ORAL

## 2020-09-21 MED ORDER — SODIUM CHLORIDE 0.9 % IV SOLN
200.0000 mg | Freq: Once | INTRAVENOUS | Status: AC
  Administered 2020-09-21: 200 mg via INTRAVENOUS
  Filled 2020-09-21: qty 8

## 2020-09-21 MED ORDER — SODIUM CHLORIDE 0.9 % IV SOLN
500.0000 mg/m2 | Freq: Once | INTRAVENOUS | Status: AC
  Administered 2020-09-21: 900 mg via INTRAVENOUS
  Filled 2020-09-21: qty 20

## 2020-09-21 MED ORDER — CYANOCOBALAMIN 1000 MCG/ML IJ SOLN
1000.0000 ug | Freq: Once | INTRAMUSCULAR | Status: AC
  Administered 2020-09-21: 1000 ug via INTRAMUSCULAR

## 2020-09-21 NOTE — Patient Instructions (Signed)
[facility] Discharge Instructions for Patients Receiving Chemotherapy  Today you received the following chemotherapy agents: Keytruda, Alimta  To help prevent nausea and vomiting after your treatment, we encourage you to take your nausea medication as directed.    If you develop nausea and vomiting that is not controlled by your nausea medication, call the clinic.   BELOW ARE SYMPTOMS THAT SHOULD BE REPORTED IMMEDIATELY:  *FEVER GREATER THAN 100.5 F  *CHILLS WITH OR WITHOUT FEVER  NAUSEA AND VOMITING THAT IS NOT CONTROLLED WITH YOUR NAUSEA MEDICATION  *UNUSUAL SHORTNESS OF BREATH  *UNUSUAL BRUISING OR BLEEDING  TENDERNESS IN MOUTH AND THROAT WITH OR WITHOUT PRESENCE OF ULCERS  *URINARY PROBLEMS  *BOWEL PROBLEMS  UNUSUAL RASH Items with * indicate a potential emergency and should be followed up as soon as possible.  Feel free to call the clinic should you have any questions or concerns. The clinic phone number is [phone].  Please show the [facility] at check-in to the Emergency Department and triage nurse.

## 2020-09-21 NOTE — Progress Notes (Signed)
Pt discharged in no apparent distress. Pt left in wheelchair just as he arrived in one.  Pt aware of discharge instructions and verbalized understanding and had no further questions.

## 2020-09-21 NOTE — Progress Notes (Signed)
[facility] [phone]   [phone]  OFFICE PROGRESS NOTE  Seward Carol, MD [address] [place] [address]  DIAGNOSIS: Stage IV (T4, N3, M1c) non-small cell lung cancer, adenocarcinoma presented with left upper lobe lung mass in addition to bilateral mediastinal lymphadenopathy as well as bilateral pulmonary nodules and metastatic disease to the bones and brain diagnosed in July 2021.  Molecular studies by [employer] showed no actionable mutations.  PRIOR THERAPY: None  CURRENT THERAPY: Systemic chemotherapy with carboplatin for AUC of 5, Alimta 500 mg/M2 and Keytruda 200 mg IV every 3 weeks.  First dose 04/06/2020.  Status post 8 cycles.  Starting from cycle #5 he is on maintenance treatment with Alimta and Keytruda every 3 weeks.  INTERVAL HISTORY: Phillip Franklin 80 y.o. male returns to the clinic today for follow-up visit accompanied by his wife [name].  The patient is feeling fine today with no concerning complaints except for mild fatigue.  He complains of 1 day of nausea after the treatment with increased frequency of urination secondary to aggressive hydration after the treatment.  He denied having any current chest pain, shortness of breath, cough or hemoptysis.  He denied having any fever or chills.  He has no nausea, vomiting, diarrhea or constipation.  He has no headache or visual changes.  He tolerated the last cycle of his treatment fairly well.  The patient is here today for evaluation before starting cycle #9.   MEDICAL HISTORY: Past Medical History:  Diagnosis Date  . Depression   . GERD (gastroesophageal reflux disease)    occasional, diet controlled  . High cholesterol   . Hypertension    no longer taking medication - states it's undercontrol  . Hypothyroid     ALLERGIES:  is allergic to codeine, hydrocodone, oxycodone, and penicillins.  MEDICATIONS:  Current Outpatient Medications  Medication Sig  Dispense Refill  . acetaminophen (TYLENOL) 325 MG tablet Take 325-650 mg by mouth every 6 (six) hours as needed for moderate pain or headache.    Marland Kitchen amoxicillin-clavulanate (AUGMENTIN) 875-125 MG tablet Take 1 tablet by mouth 2 (two) times daily.    Marland Kitchen atorvastatin (LIPITOR) 20 MG tablet Take 20 mg by mouth daily.    . benzonatate (TESSALON) 200 MG capsule Take 1 capsule (200 mg total) by mouth 4 (four) times daily as needed for cough. 60 capsule 1  . chlorhexidine (PERIDEX) 0.12 % solution RINSE WITH 15ML THREE TIMES DAILY FOR MOUTH RINSE FOR 1 MINUTE THEN EXPECTORATE    . citalopram (CELEXA) 20 MG tablet Take 1 tablet (20 mg total) by mouth daily. 30 tablet 3  . dronabinol (MARINOL) 2.5 MG capsule Take 1 capsule (2.5 mg total) by mouth 2 (two) times daily before a meal. (Patient taking differently: Take 2.5 mg by mouth 2 (two) times daily before a meal. Patient is only taking once a day) 60 capsule 2  . esomeprazole (NEXIUM) 20 MG capsule Take 20 mg by mouth daily as needed (acid reflux).     . folic acid (FOLVITE) 1 MG tablet Take 1 tablet by mouth once daily 30 tablet 0  . HYDROmorphone (DILAUDID) 2 MG tablet Take 2 mg by mouth 4 (four) times daily.    Marland Kitchen levothyroxine (SYNTHROID) 88 MCG tablet Take 88 mcg by mouth daily before breakfast.    . LORazepam (ATIVAN) 0.5 MG tablet Take 1-2 tablets by mouth 30 minutes before procedures, PRN anxiety. 8 tablet 0  .  Multiple Vitamins-Minerals (CENTRUM SILVER 50+MEN) TABS Take 1 tablet by mouth daily.    Marland Kitchen neomycin-polymyxin b-dexamethasone (MAXITROL) 3.5-10000-0.1 SUSP Place 1 drop into the right eye every 8 (eight) hours.    . potassium chloride SA (KLOR-CON) 20 MEQ tablet Take 1 tablet (20 mEq total) by mouth daily. 6 tablet 0  . prochlorperazine (COMPAZINE) 10 MG tablet Take 1 tablet (10 mg total) by mouth every 6 (six) hours as needed for nausea or vomiting. 30 tablet 0  . sulfamethoxazole-trimethoprim (BACTRIM DS) 800-160 MG tablet Take 1 tablet by  mouth 2 (two) times daily. 14 tablet 0  . tamsulosin (FLOMAX) 0.4 MG CAPS capsule Take 0.4 mg by mouth every evening.     . traMADol (ULTRAM) 50 MG tablet Take 1 tablet (50 mg total) by mouth every 6 (six) hours as needed. 30 tablet 0   No current facility-administered medications for this visit.    SURGICAL HISTORY:  Past Surgical History:  Procedure Laterality Date  . BRONCHIAL NEEDLE ASPIRATION BIOPSY  03/24/2020   Procedure: BRONCHIAL NEEDLE ASPIRATION BIOPSIES;  Surgeon: Candee Furbish, MD;  Location: [facility] ENDOSCOPY;  Service: Pulmonary;;  . HEMORRHOID SURGERY    . VIDEO BRONCHOSCOPY WITH ENDOBRONCHIAL ULTRASOUND N/A 03/24/2020   Procedure: VIDEO BRONCHOSCOPY WITH ENDOBRONCHIAL ULTRASOUND;  Surgeon: Candee Furbish, MD;  Location: [facility] ENDOSCOPY;  Service: Pulmonary;  Laterality: N/A;    REVIEW OF SYSTEMS:  A comprehensive review of systems was negative except for: Constitutional: positive for fatigue   PHYSICAL EXAMINATION: General appearance: alert, cooperative, fatigued and no distress Head: Normocephalic, without obvious abnormality, atraumatic Neck: no adenopathy, no JVD, supple, symmetrical, trachea midline and thyroid not enlarged, symmetric, no tenderness/mass/nodules Lymph nodes: Cervical, supraclavicular, and axillary nodes normal. Resp: clear to auscultation bilaterally Back: symmetric, no curvature. ROM normal. No CVA tenderness. Cardio: regular rate and rhythm, S1, S2 normal, no murmur, click, rub or gallop GI: soft, non-tender; bowel sounds normal; no masses,  no organomegaly Extremities: extremities normal, atraumatic, no cyanosis or edema  ECOG PERFORMANCE STATUS: 1 - Symptomatic but completely ambulatory  Blood pressure 130/60, pulse 79, temperature 97.9 F (36.6 C), temperature source Tympanic, resp. rate 17, height 5\' 9"  (1.753 m), weight 147 lb 9.6 oz (67 kg), SpO2 97 %.  LABORATORY DATA: Lab Results  Component Value Date   WBC 5.4 09/21/2020   HGB 10.9 (L)  09/21/2020   HCT 33.6 (L) 09/21/2020   MCV 96.8 09/21/2020   PLT 250 09/21/2020      Chemistry      Component Value Date/Time   NA 140 09/01/2020 1318   K 3.7 09/01/2020 1318   CL 106 09/01/2020 1318   CO2 27 09/01/2020 1318   BUN 5 (L) 09/01/2020 1318   CREATININE 0.98 09/01/2020 1318      Component Value Date/Time   CALCIUM 9.4 09/01/2020 1318   ALKPHOS 103 09/01/2020 1318   AST 22 09/01/2020 1318   ALT 15 09/01/2020 1318   BILITOT 0.5 09/01/2020 1318       RADIOGRAPHIC STUDIES: MR Brain W Wo Contrast  Result Date: 08/25/2020 CLINICAL DATA:  Metastatic non-small cell lung cancer. Six posterior fossa metastases treated with SRS on 05/04/2020. EXAM: MRI HEAD WITHOUT AND WITH CONTRAST TECHNIQUE: Multiplanar, multiecho pulse sequences of the brain and surrounding structures were obtained without and with intravenous contrast. CONTRAST:  6.32mL GADAVIST GADOBUTROL 1 MMOL/ML IV SOLN COMPARISON:  04/23/2020 FINDINGS: The study is moderately motion degraded including on postcontrast imaging. BRAIN New Lesions: None. Larger lesions:  None. Stable or Smaller lesions: 1. 6 mm left cerebellar lesion with new chronic blood products (series 1100, image 109, previously 8 mm). 2. Faintly visible 3 mm left cerebellar lesion anterior to #1 (series 1100, image 110, previously 3 mm). 3. 2 mm right cerebellar lesion (series 1100, image 122, previously 3 mm). 4. The other 3 small enhancing cerebellar lesions on the prior study are not visible today, either decreased in size/resolved or obscured by motion. Other Brain findings: There is no edema associated with any of the treated metastases. No acute infarct, midline shift, or extra-axial fluid collection is identified. There is mild to moderate cerebral atrophy. A 19 x 7 mm enhancing extra-axial mass laterally over the right frontal convexity is unchanged, and there is no associated brain edema or mass effect. Vascular: Major intracranial vascular flow voids  are preserved. Skull and upper cervical spine: No suspicious marrow lesion. Sinuses/Orbits: Unremarkable orbits. Paranasal sinuses and mastoid air cells are clear. Other: None. IMPRESSION: 1. Motion degraded examination without evidence of new or progressive intracranial metastatic disease. 2. Decreased size of treated cerebellar metastases. 3. Unchanged right frontal meningioma. Electronically Signed   By: Logan Bores M.D.   On: 08/25/2020 09:14    ASSESSMENT AND PLAN: This is a very pleasant 80 years old white male recently diagnosed with a stage IV (T4, N3, M1C) non-small cell lung cancer, adenocarcinoma presented with left upper lobe lung mass in addition to bilateral pulmonary nodules as well as bilateral hilar and mediastinal lymphadenopathy and metastatic disease to the bones and brain diagnosed in July 2021. He underwent stereotactic radiotherapy to the metastatic brain lesion under the care of Dr. Isidore Moos. Unfortunately the molecular studies by [employer] was negative for actionable mutations.  The initial tissue biopsy was insufficient for molecular studies by [facility]. The patient had repeat biopsy for the molecular studies and the expectation is to have the result on 05/05/2020. He started systemic chemotherapy with carboplatin for AUC of 5, Alimta 500 mg/M2 and Keytruda 200 mg IV every 3 weeks status post 8 cycles.  Starting from cycle #5 he is on maintenance treatment with Alimta and Keytruda.   The patient continues to tolerate this treatment well with no concerning adverse effects. I recommended for him to proceed with cycle #9 today as planned. I will see him back for follow-up visit in 3 weeks for evaluation with repeat CT scan of the chest, abdomen pelvis for restaging of his disease. For the depression, he will continue his treatment with Cymbalta. The patient was advised to call immediately if he has any other concerning symptoms in the interval. Disclaimer: This note was  dictated with voice recognition software. Similar sounding words can inadvertently be transcribed and may not be corrected upon review.

## 2020-09-22 ENCOUNTER — Ambulatory Visit: Payer: Medicare PPO

## 2020-09-22 ENCOUNTER — Other Ambulatory Visit: Payer: Medicare PPO

## 2020-09-22 ENCOUNTER — Ambulatory Visit: Payer: Medicare PPO | Admitting: Internal Medicine

## 2020-09-23 ENCOUNTER — Telehealth: Payer: Self-pay | Admitting: Internal Medicine

## 2020-09-23 NOTE — Telephone Encounter (Signed)
Scheduled per 1/3 los. Pt will receive an updated appt calendar per next visit appt notes

## 2020-10-08 NOTE — Progress Notes (Signed)
[name] OFFICE PROGRESS NOTE  Seward Carol, MD [place] [address] [place] [address]  DIAGNOSIS: Stage IV (T4, N3, M1c) non-small cell lung cancer, adenocarcinoma presented with left upper lobe lung mass in addition to bilateral mediastinal lymphadenopathy as well as bilateral pulmonary nodules and metastatic disease to the bones and brain diagnosed in July 2021.  Molecular studies by [facility]showed no actionable mutations.  PRIOR THERAPY: SRS to the metastatic brain lesion under the care of Dr. Isidore Moos on 05/04/20  CURRENT THERAPY: Palliative systemic chemotherapy with carboplatin for an AUC of 5, Alimta 500 mg per metered squared, Keytruda 200 mg IV every 3 weeks. Status post 9 cycles. First dose 04/06/20. Starting from cycle # 5, the patient has been on maintenance Alimta and Keytruda.   INTERVAL HISTORY: Phillip Franklin 80 y.o. male returns to clinic today for a follow-up visit accompanied by his wife.  The patient is feeling fair today without any concerning complaints. The patient denies any recent fever, chills, or night sweats. His wife thinks that he is eating better. He denies any chest pain or hemoptysis.  He reports stable dyspnea on exertion.  He reports baseline cough.  He denies any diarrhea or constipation. He reports one episode of nausea/vomiting following his last cycle of treatment. He denies any headache or visual changes.  The patient recently had a restaging CT scan performed.  The patient is here today for evaluation and to review his scan results before starting cycle #10.   MEDICAL HISTORY: Past Medical History:  Diagnosis Date  . Depression   . GERD (gastroesophageal reflux disease)    occasional, diet controlled  . High cholesterol   . Hypertension    no longer taking medication - states it's undercontrol  . Hypothyroid     ALLERGIES:  is allergic to codeine, hydrocodone, oxycodone, and penicillins.  MEDICATIONS:   Current Outpatient Medications  Medication Sig Dispense Refill  . acetaminophen (TYLENOL) 325 MG tablet Take 325-650 mg by mouth every 6 (six) hours as needed for moderate pain or headache.    Marland Kitchen amoxicillin-clavulanate (AUGMENTIN) 875-125 MG tablet Take 1 tablet by mouth 2 (two) times daily.    Marland Kitchen atorvastatin (LIPITOR) 20 MG tablet Take 20 mg by mouth daily.    . benzonatate (TESSALON) 200 MG capsule Take 1 capsule (200 mg total) by mouth 4 (four) times daily as needed for cough. 60 capsule 1  . chlorhexidine (PERIDEX) 0.12 % solution RINSE WITH 15ML THREE TIMES DAILY FOR MOUTH RINSE FOR 1 MINUTE THEN EXPECTORATE    . citalopram (CELEXA) 20 MG tablet Take 1 tablet (20 mg total) by mouth daily. 30 tablet 3  . dronabinol (MARINOL) 2.5 MG capsule Take 1 capsule (2.5 mg total) by mouth 2 (two) times daily before a meal. (Patient taking differently: Take 2.5 mg by mouth 2 (two) times daily before a meal. Patient is only taking once a day) 60 capsule 2  . esomeprazole (NEXIUM) 20 MG capsule Take 20 mg by mouth daily as needed (acid reflux).     . folic acid (FOLVITE) 1 MG tablet Take 1 tablet by mouth once daily 30 tablet 0  . HYDROmorphone (DILAUDID) 2 MG tablet Take 2 mg by mouth 4 (four) times daily.    Marland Kitchen levothyroxine (SYNTHROID) 88 MCG tablet Take 88 mcg by mouth daily before breakfast.    . LORazepam (ATIVAN) 0.5 MG tablet Take 1-2 tablets by mouth 30 minutes before procedures, PRN anxiety. 8 tablet  0  . Multiple Vitamins-Minerals (CENTRUM SILVER 50+MEN) TABS Take 1 tablet by mouth daily.    Marland Kitchen neomycin-polymyxin b-dexamethasone (MAXITROL) 3.5-10000-0.1 SUSP Place 1 drop into the right eye every 8 (eight) hours.    . potassium chloride SA (KLOR-CON) 20 MEQ tablet Take 1 tablet (20 mEq total) by mouth daily. 6 tablet 0  . prochlorperazine (COMPAZINE) 10 MG tablet Take 1 tablet (10 mg total) by mouth every 6 (six) hours as needed for nausea or vomiting. 30 tablet 0  . sulfamethoxazole-trimethoprim  (BACTRIM DS) 800-160 MG tablet Take 1 tablet by mouth 2 (two) times daily. 14 tablet 0  . tamsulosin (FLOMAX) 0.4 MG CAPS capsule Take 0.4 mg by mouth every evening.     . traMADol (ULTRAM) 50 MG tablet Take 1 tablet (50 mg total) by mouth every 6 (six) hours as needed. 30 tablet 0   No current facility-administered medications for this visit.    SURGICAL HISTORY:  Past Surgical History:  Procedure Laterality Date  . BRONCHIAL NEEDLE ASPIRATION BIOPSY  03/24/2020   Procedure: BRONCHIAL NEEDLE ASPIRATION BIOPSIES;  Surgeon: Candee Furbish, MD;  Location: [facility] ENDOSCOPY;  Service: Pulmonary;;  . HEMORRHOID SURGERY    . VIDEO BRONCHOSCOPY WITH ENDOBRONCHIAL ULTRASOUND N/A 03/24/2020   Procedure: VIDEO BRONCHOSCOPY WITH ENDOBRONCHIAL ULTRASOUND;  Surgeon: Candee Furbish, MD;  Location: [facility] ENDOSCOPY;  Service: Pulmonary;  Laterality: N/A;    REVIEW OF SYSTEMS:   Review of Systems  Constitutional: Positive for fatigue. Negative for appetite change, chills, fever and unexpected weight change.  HENT:   Negative for mouth sores, nosebleeds, sore throat and trouble swallowing.   Eyes: Negative for eye problems and icterus.  Respiratory: Positive for dyspnea on exertion. Negative for cough, hemoptysis, and wheezing.   Cardiovascular: Negative for chest pain and leg swelling.  Gastrointestinal: Negative for abdominal pain, constipation, diarrhea, nausea and vomiting (resolved).  Genitourinary: Negative for bladder incontinence, difficulty urinating, dysuria, frequency and hematuria.   Musculoskeletal: Negative for back pain, gait problem, neck pain and neck stiffness.  Skin: Negative for itching and rash.  Neurological: Negative for dizziness, extremity weakness, gait problem, headaches, light-headedness and seizures.  Hematological: Negative for adenopathy. Does not bruise/bleed easily.  Psychiatric/Behavioral: Negative for confusion, depression and sleep disturbance. The patient is not  nervous/anxious.     PHYSICAL EXAMINATION:  Blood pressure (!) 115/49, pulse 75, temperature (!) 97 F (36.1 C), temperature source Tympanic, resp. rate 14, height 5\' 9"  (1.753 m), weight 145 lb 9.6 oz (66 kg), SpO2 98 %.  ECOG PERFORMANCE STATUS: 2  Physical Exam  Constitutional: Oriented to person, place, and time and well-developed, well-nourished, and in no distress.  HENT:  Head: Normocephalic and atraumatic.  Mouth/Throat: Oropharynx is clear and moist. No oropharyngeal exudate.  Eyes: Conjunctivae are normal. Right eye exhibits no discharge. Left eye exhibits no discharge. No scleral icterus.  Neck: Normal range of motion. Neck supple.  Cardiovascular: Normal rate, regular rhythm, systolic murmur noted left intercostal space and intact distal pulses.  Pulmonary/Chest: Effort normal and breath sounds normal. No respiratory distress. No wheezes. No rales.  Abdominal: Soft. Bowel sounds are normal. Exhibits no distension and no mass. There is no tenderness.  Musculoskeletal: Normal range of motion. Exhibits no edema.  Lymphadenopathy:  No cervical adenopathy. Neurological: Alert and oriented to person, place, and time. Exhibits normal muscle tone. Gait normal. Coordination normal.  Skin: Skin is warm and dry. No rash noted. Not diaphoretic. No erythema. No pallor.  Psychiatric: Mood, memory and judgment  normal.  Vitals reviewed.  LABORATORY DATA: Lab Results  Component Value Date   WBC 5.4 10/13/2020   HGB 10.9 (L) 10/13/2020   HCT 33.4 (L) 10/13/2020   MCV 97.9 10/13/2020   PLT 204 10/13/2020      Chemistry      Component Value Date/Time   NA 137 10/13/2020 1347   K 3.6 10/13/2020 1347   CL 105 10/13/2020 1347   CO2 25 10/13/2020 1347   BUN 7 (L) 10/13/2020 1347   CREATININE 1.00 10/13/2020 1347      Component Value Date/Time   CALCIUM 9.2 10/13/2020 1347   ALKPHOS 122 10/13/2020 1347   AST 23 10/13/2020 1347   ALT 13 10/13/2020 1347   BILITOT 0.4  10/13/2020 1347       RADIOGRAPHIC STUDIES:  CT Chest W Contrast  Result Date: 10/12/2020 CLINICAL DATA:  Non-small cell lung cancer staging. Ongoing oral therapy by report. Stage IV disease in this 80 year old male, follow-up evaluation. EXAM: CT CHEST, ABDOMEN, AND PELVIS WITH CONTRAST TECHNIQUE: Multidetector CT imaging of the chest, abdomen and pelvis was performed following the standard protocol during bolus administration of intravenous contrast. CONTRAST:  171mL OMNIPAQUE IOHEXOL 300 MG/ML  SOLN COMPARISON:  August 07, 2020 FINDINGS: CT CHEST FINDINGS Cardiovascular: Calcified and noncalcified atheromatous plaque in the thoracic aorta. No aneurysmal dilation. Three-vessel coronary artery disease. Normal heart size. Central pulmonary vasculature normal caliber. Mediastinum/Nodes: High RIGHT paratracheal lymph node (image 15, series 2) approximately 9 mm short axis previously 8 mm short axis. Subcarinal nodal tissue (image 28, series 2) 12 mm short axis previously 13 mm short axis. Scattered small lymph nodes elsewhere in the chest with similar appearance including paraesophageal lymph nodes Lungs/Pleura: LEFT upper lobe pulmonary mass (image 25, series 6) 4.5 x 3.0 cm in greatest axial dimension previously approximately 3.4 x 2.6 cm. When measured in the coronal plane greatest dimension is approximately 4.3 cm as compared to 3.9 cm. Innumerable nodules ranging from 2-3 mm to 1 cm size. LEFT lower lobe pulmonary nodule (image 105, series 6) 14 mm, stable. LEFT lower lobe pulmonary nodule along the pleura (image 87, series 6) 15 mm previously 11 mm. Multiplicity of nodules makes detection of new nodules difficult if not impossible. Nodule in the RIGHT middle lobe (image 80, series 6) 10 mm as compared to approximately 12 mm greatest dimension. Signs of septal thickening though subtle are stable. Musculoskeletal: Widespread bony metastatic disease, similar about the bony thorax when compared to the  prior study. See below for full musculoskeletal detail. CT ABDOMEN PELVIS FINDINGS Hepatobiliary: Mild intra and extrahepatic biliary duct distension is similar to the prior exam and not associated with pericholecystic stranding or gallbladder wall thickening. The no focal, suspicious hepatic lesion. Stable tiny hypodensity along the margin of the RIGHT hepatic lobe is unchanged dating back to at least June of 2021 measuring approximately 6 mm. Pancreas: Normal, without mass, inflammation or ductal dilatation. Spleen: Normal Adrenals/Urinary Tract: Adrenal glands are normal. Symmetric renal enhancement. Cyst in the upper pole the LEFT kidney. No hydronephrosis. Urinary bladder under distended limiting assessment. Prostate with impression upon the bladder base in the setting of marked prostatomegaly. Stomach/Bowel: No acute gastrointestinal process.  Normal appendix. Vascular/Lymphatic: Calcified atheromatous plaque of the abdominal aorta without aneurysmal dilation. There is no gastrohepatic or hepatoduodenal ligament lymphadenopathy. No retroperitoneal or mesenteric lymphadenopathy. No pelvic sidewall lymphadenopathy. Reproductive: Prostatomegaly and heterogeneity similar to the prior exam. Other: No ascites Musculoskeletal: RIGHT scapular sclerosis is subtle and unchanged. Scattered  areas of bony sclerosis elsewhere about the bony thorax with similar appearance. T8 lesion along the inferior endplate with destructive changes and bony sclerosis surrounding lytic process centrally is unchanged at approximately 13 mm. Sclerotic lesion at L1 is unchanged. Subtle sclerosis in the RIGHT ischium and scattered foci of sclerosis about the bony pelvis with similar appearance. IMPRESSION: 1. Slight interval increase in size of the LEFT upper lobe pulmonary mass. Differences size is not as dramatic when measured in the coronal plane. 2. Dominant area in the LEFT lower lobe along the periphery may be slightly larger. On  balance, other pulmonary nodules are stable. 3. Widespread bony metastatic disease with similar appearance. 4. Unchanged appearance of mediastinal lymph nodes. 5. No evidence of metastatic disease to the abdomen or pelvis. 6. Prostatomegaly and heterogeneity similar to the prior exam. 7. Stable nonspecific biliary duct distension. Correlate with any abdominal symptoms or laboratory abnormalities that would suggest biliary obstruction. Since June of 2021. Gallbladder is no longer hyperenhancing. 8. Three-vessel coronary artery disease. 9. Aortic atherosclerosis. Aortic Atherosclerosis (ICD10-I70.0). Electronically Signed   By: Zetta Bills M.D.   On: 10/12/2020 17:10   CT Abdomen Pelvis W Contrast  Result Date: 10/12/2020 CLINICAL DATA:  Non-small cell lung cancer staging. Ongoing oral therapy by report. Stage IV disease in this 80 year old male, follow-up evaluation. EXAM: CT CHEST, ABDOMEN, AND PELVIS WITH CONTRAST TECHNIQUE: Multidetector CT imaging of the chest, abdomen and pelvis was performed following the standard protocol during bolus administration of intravenous contrast. CONTRAST:  199mL OMNIPAQUE IOHEXOL 300 MG/ML  SOLN COMPARISON:  August 07, 2020 FINDINGS: CT CHEST FINDINGS Cardiovascular: Calcified and noncalcified atheromatous plaque in the thoracic aorta. No aneurysmal dilation. Three-vessel coronary artery disease. Normal heart size. Central pulmonary vasculature normal caliber. Mediastinum/Nodes: High RIGHT paratracheal lymph node (image 15, series 2) approximately 9 mm short axis previously 8 mm short axis. Subcarinal nodal tissue (image 28, series 2) 12 mm short axis previously 13 mm short axis. Scattered small lymph nodes elsewhere in the chest with similar appearance including paraesophageal lymph nodes Lungs/Pleura: LEFT upper lobe pulmonary mass (image 25, series 6) 4.5 x 3.0 cm in greatest axial dimension previously approximately 3.4 x 2.6 cm. When measured in the coronal plane  greatest dimension is approximately 4.3 cm as compared to 3.9 cm. Innumerable nodules ranging from 2-3 mm to 1 cm size. LEFT lower lobe pulmonary nodule (image 105, series 6) 14 mm, stable. LEFT lower lobe pulmonary nodule along the pleura (image 87, series 6) 15 mm previously 11 mm. Multiplicity of nodules makes detection of new nodules difficult if not impossible. Nodule in the RIGHT middle lobe (image 80, series 6) 10 mm as compared to approximately 12 mm greatest dimension. Signs of septal thickening though subtle are stable. Musculoskeletal: Widespread bony metastatic disease, similar about the bony thorax when compared to the prior study. See below for full musculoskeletal detail. CT ABDOMEN PELVIS FINDINGS Hepatobiliary: Mild intra and extrahepatic biliary duct distension is similar to the prior exam and not associated with pericholecystic stranding or gallbladder wall thickening. The no focal, suspicious hepatic lesion. Stable tiny hypodensity along the margin of the RIGHT hepatic lobe is unchanged dating back to at least June of 2021 measuring approximately 6 mm. Pancreas: Normal, without mass, inflammation or ductal dilatation. Spleen: Normal Adrenals/Urinary Tract: Adrenal glands are normal. Symmetric renal enhancement. Cyst in the upper pole the LEFT kidney. No hydronephrosis. Urinary bladder under distended limiting assessment. Prostate with impression upon the bladder base in the setting of  marked prostatomegaly. Stomach/Bowel: No acute gastrointestinal process.  Normal appendix. Vascular/Lymphatic: Calcified atheromatous plaque of the abdominal aorta without aneurysmal dilation. There is no gastrohepatic or hepatoduodenal ligament lymphadenopathy. No retroperitoneal or mesenteric lymphadenopathy. No pelvic sidewall lymphadenopathy. Reproductive: Prostatomegaly and heterogeneity similar to the prior exam. Other: No ascites Musculoskeletal: RIGHT scapular sclerosis is subtle and unchanged. Scattered  areas of bony sclerosis elsewhere about the bony thorax with similar appearance. T8 lesion along the inferior endplate with destructive changes and bony sclerosis surrounding lytic process centrally is unchanged at approximately 13 mm. Sclerotic lesion at L1 is unchanged. Subtle sclerosis in the RIGHT ischium and scattered foci of sclerosis about the bony pelvis with similar appearance. IMPRESSION: 1. Slight interval increase in size of the LEFT upper lobe pulmonary mass. Differences size is not as dramatic when measured in the coronal plane. 2. Dominant area in the LEFT lower lobe along the periphery may be slightly larger. On balance, other pulmonary nodules are stable. 3. Widespread bony metastatic disease with similar appearance. 4. Unchanged appearance of mediastinal lymph nodes. 5. No evidence of metastatic disease to the abdomen or pelvis. 6. Prostatomegaly and heterogeneity similar to the prior exam. 7. Stable nonspecific biliary duct distension. Correlate with any abdominal symptoms or laboratory abnormalities that would suggest biliary obstruction. Since June of 2021. Gallbladder is no longer hyperenhancing. 8. Three-vessel coronary artery disease. 9. Aortic atherosclerosis. Aortic Atherosclerosis (ICD10-I70.0). Electronically Signed   By: Zetta Bills M.D.   On: 10/12/2020 17:10     ASSESSMENT/PLAN:  This is a very pleasant 80 year old male recently diagnosed with stage IV (T4, N3, M1C) non-small cell lung cancer, adenocarcinoma. He presented with a left upper lobe lung mass in addition to bilateral pulmonary nodules as well as bilateral hilar and mediastinal lymphadenopathy. He also has metastatic disease to the bones and brain. He was diagnosed in July 2021. His molecular studies performed by [facility] [employer] was negative for any actionable mutations. The initial tissue biopsy was insufficient for molecular studies by [facility]. The patient had repeat biopsy for the molecular studieswhich  were negative for any actionable mutations.   He completed SRS to the brain lesion under the care of Dr. Isidore Moos. This was completed on 05/04/20.  [name] currently undergoing palliative systemic chemotherapy with carboplatin for an AUC of 5, Alimta 500 mg per metered squared, Keytruda 200 mg IV every 3 weeks. [name] status post 9 cycles. Starting from cycle #5, the patient has been on maintenance Keytruda and Alimta.  The patient recently had a restaging CT scan performed.  Dr. Julien Nordmann personally and independently reviewed the scan discussed the results with the patient today.  The scan showed fairly stable disease.  Dr. Julien Nordmann recommends he continue on the same treatment.  He will proceed with cycle #10 today scheduled.  We will see him back for follow-up visit in 3 weeks for evaluation before starting cycle #11.  The patient will continue to take Celexa for his depression.  The patient was advised to call immediately if he has any concerning symptoms in the interval. The patient voices understanding of current disease status and treatment options and is in agreement with the current care plan. All questions were answered. The patient knows to call the clinic with any problems, questions or concerns. We can certainly see the patient much sooner if necessary  No orders of the defined types were placed in this encounter.    I spent 20-29 minutes for this encounter  Linn Goetze L Jourden Delmont, PA-C 10/13/20  ADDENDUM: Hematology/Oncology Attending: I had a face-to-face encounter with the patient.  I reviewed his lab, scan and recommended his care plan.  This is a very pleasant 80 years old white male diagnosed with a stage IV non-small cell lung cancer, adenocarcinoma with no actionable mutation in July 2021.  The patient started systemic chemotherapy initially with carboplatin, Alimta and Keytruda for 4 cycles and he is currently on maintenance treatment with Alimta and Keytruda every 3 weeks  status post 5 more cycles. He has been tolerating this treatment well except for the fatigue.  He also has history of depression but start feeling better over the last few weeks.  He denied having any significant recent weight loss or night sweats. The patient had repeat CT scan of the chest, abdomen pelvis performed recently.  I personally and independently reviewed the scan images and discussed the result and showed the images to the patient today. Has a scan showed no significant evidence for disease progression but there was slight increase in the size of the dominant left upper lobe lung mass. I recommended for the patient to continue his current treatment with maintenance Alimta and Keytruda and he will proceed with cycle #10 today. I will see the patient back for follow-up visit in 3 weeks for evaluation before starting cycle #11. For the depression he will continue his current treatment with Celexa. The patient was advised to call immediately if he has any other concerning symptoms in the interval.  Disclaimer: This note was dictated with voice recognition software. Similar sounding words can inadvertently be transcribed and may be missed upon review. Eilleen Kempf, MD 10/13/20

## 2020-10-09 ENCOUNTER — Ambulatory Visit (HOSPITAL_COMMUNITY): Payer: Medicare PPO

## 2020-10-12 ENCOUNTER — Other Ambulatory Visit: Payer: Self-pay

## 2020-10-12 ENCOUNTER — Ambulatory Visit (HOSPITAL_COMMUNITY)
Admission: RE | Admit: 2020-10-12 | Discharge: 2020-10-12 | Disposition: A | Discharge status: Home / Self Care | Payer: Medicare PPO | Source: Ambulatory Visit | Attending: Internal Medicine | Admitting: Internal Medicine

## 2020-10-12 DIAGNOSIS — N281 Cyst of kidney, acquired: Secondary | ICD-10-CM | POA: Diagnosis not present

## 2020-10-12 DIAGNOSIS — C3490 Malignant neoplasm of unspecified part of unspecified bronchus or lung: Secondary | ICD-10-CM | POA: Insufficient documentation

## 2020-10-12 DIAGNOSIS — K838 Other specified diseases of biliary tract: Secondary | ICD-10-CM | POA: Diagnosis not present

## 2020-10-12 DIAGNOSIS — I2510 Atherosclerotic heart disease of native coronary artery without angina pectoris: Secondary | ICD-10-CM | POA: Diagnosis not present

## 2020-10-12 DIAGNOSIS — K7689 Other specified diseases of liver: Secondary | ICD-10-CM | POA: Diagnosis not present

## 2020-10-12 DIAGNOSIS — C7951 Secondary malignant neoplasm of bone: Secondary | ICD-10-CM | POA: Diagnosis not present

## 2020-10-12 IMAGING — CT CT ABD-PELV W/ CM
2 of 5 series · 11 of 36 positions shown, 13 images · IV contrast (omnipaque)
Comparison: [DATE]

CLINICAL DATA: Non-small cell lung cancer staging. Ongoing oral
therapy by report. Stage IV disease in this 79-year-old male,
follow-up evaluation.

EXAM:
CT CHEST, ABDOMEN, AND PELVIS WITH CONTRAST
TECHNIQUE: Multidetector CT imaging of the chest, abdomen and pelvis was
performed following the standard protocol during bolus
administration of intravenous contrast.
CONTRAST:  100mL OMNIPAQUE IOHEXOL 300 MG/ML  SOLN

[Series 2: cap with · axial · 0.82mm/px · z∈[-478,+62]mm · 8 of 136 slices shown, 10 images]
[im 14/136  mediastinal]
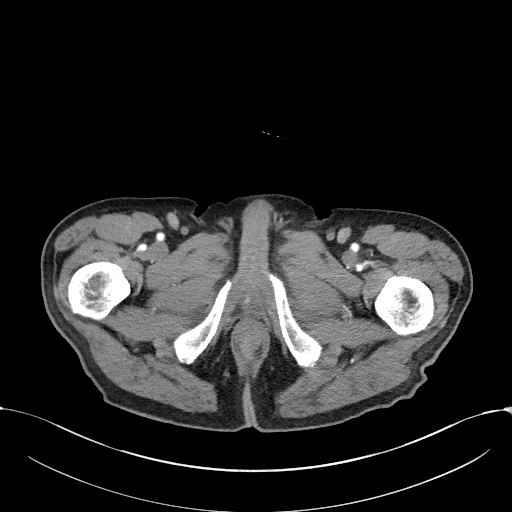
[im 14/136  lung]
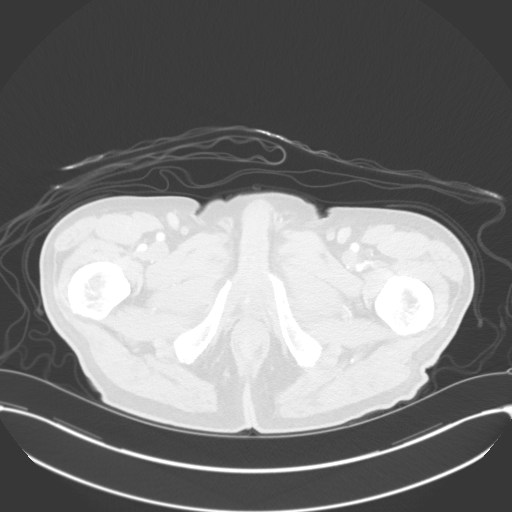
[im 28/136  lung]
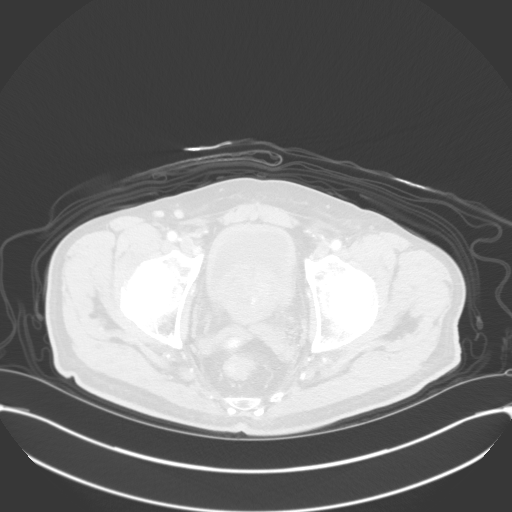
[im 41/136  lung]
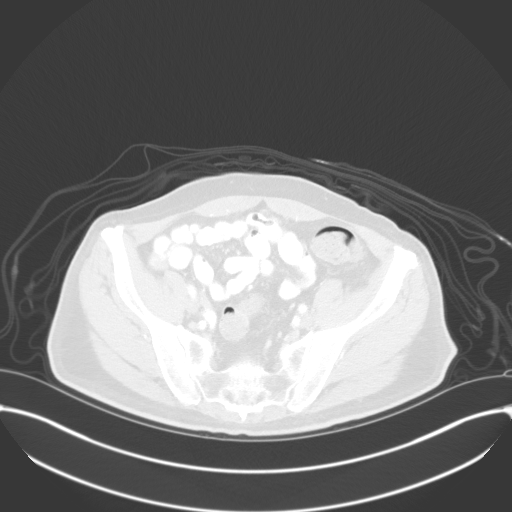
[im 55/136  lung]
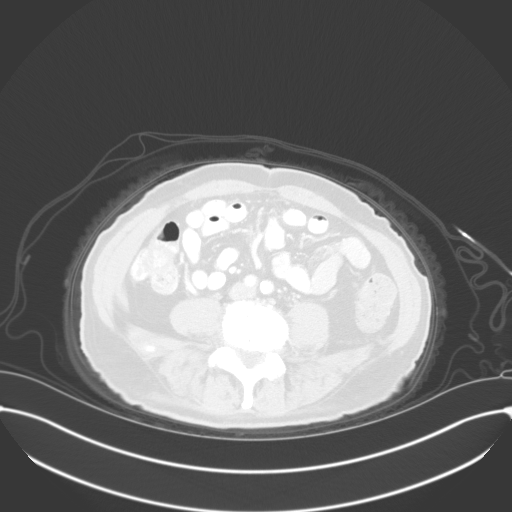
[im 82/136  mediastinal]
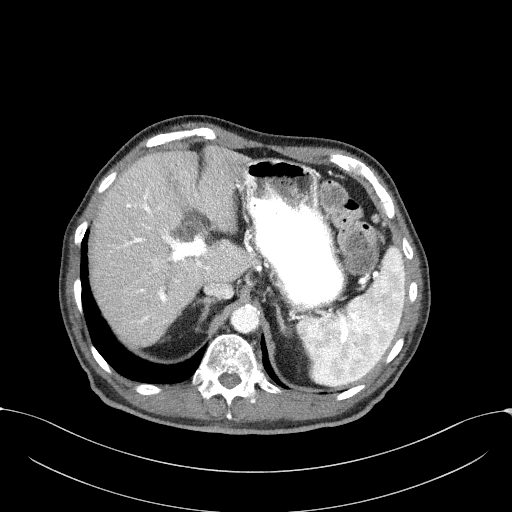
[im 82/136  lung]
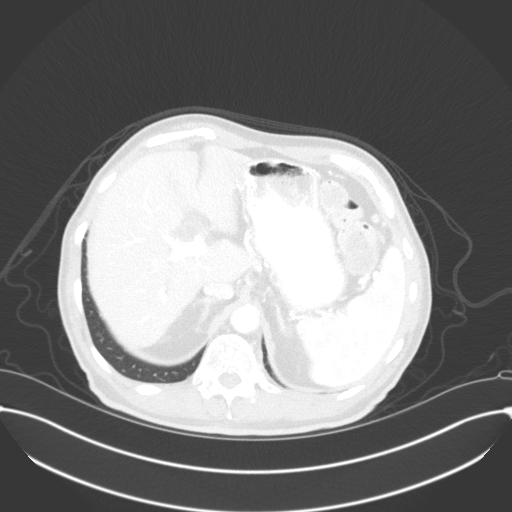
[im 95/136  lung]
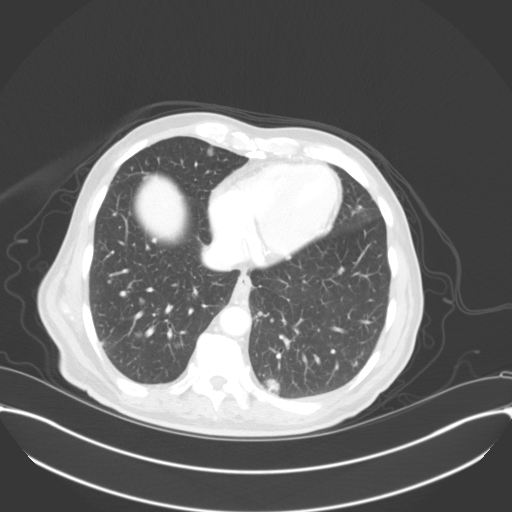
[im 109/136  lung]
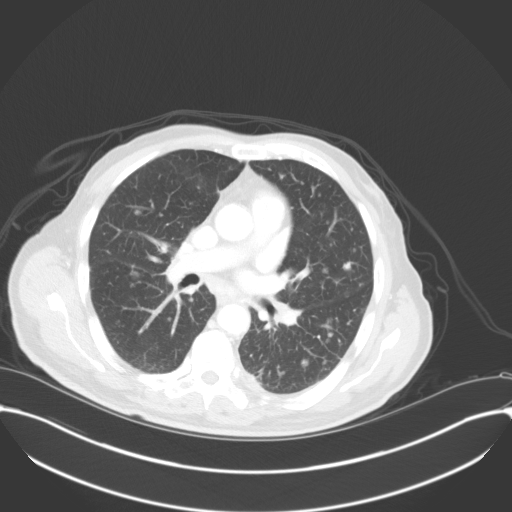
[im 122/136  lung]
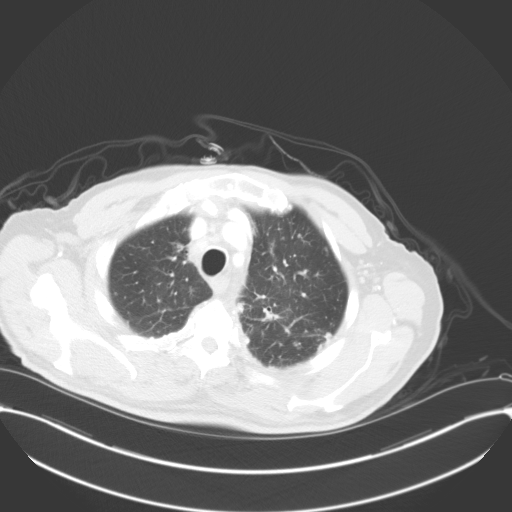

[Series 4: coronals · coronal · 0.68mm/px · 3 of 136 slices shown]
[im 28/136  lung]
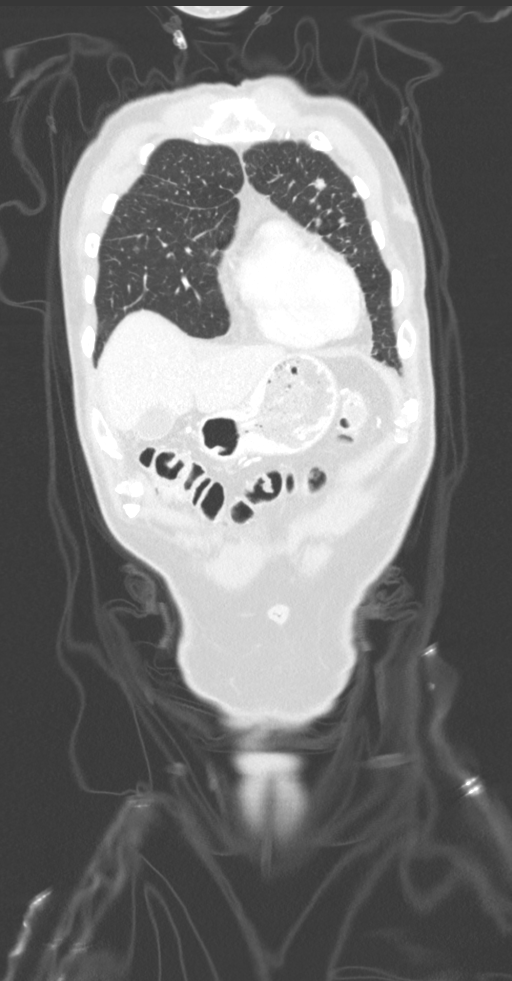
[im 55/136  lung]
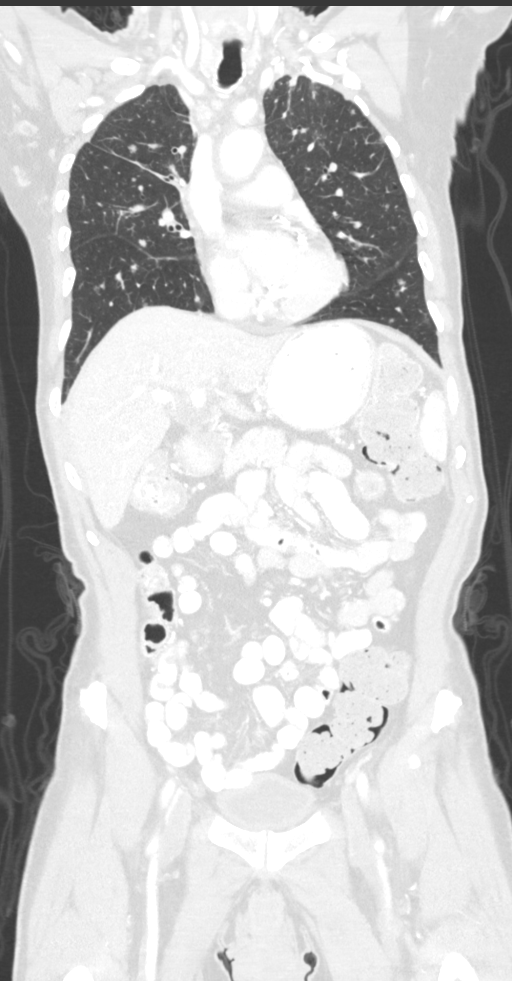
[im 82/136  lung]
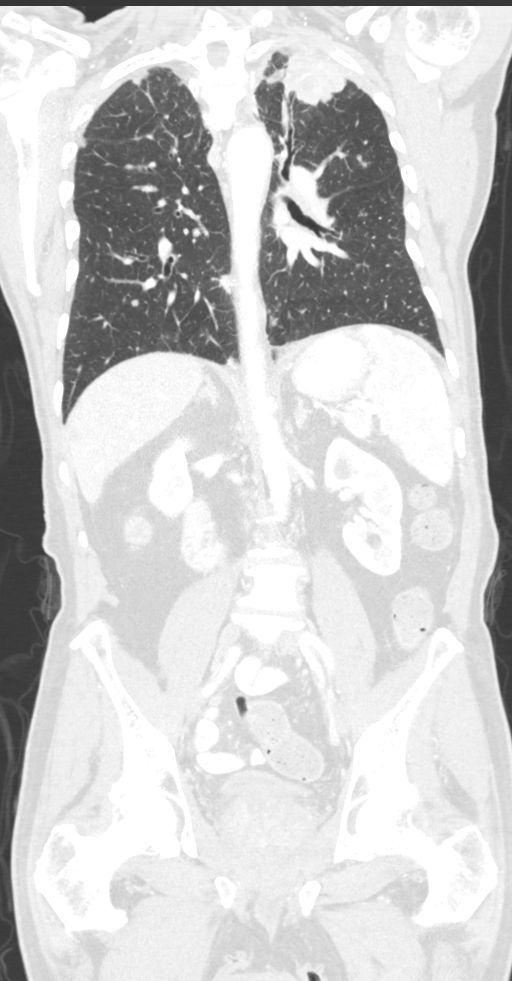

[11 of 36 positions shown; findings below may reference images not displayed]

FINDINGS: CT CHEST FINDINGS

Cardiovascular: Calcified and noncalcified atheromatous plaque in
the thoracic aorta. No aneurysmal dilation. Three-vessel coronary
artery disease. Normal heart size. Central pulmonary vasculature
normal caliber.

Mediastinum/Nodes: High RIGHT paratracheal lymph node (image 15,
series 2) approximately 9 mm short axis previously 8 mm short axis.

Subcarinal nodal tissue (image 28, series 2) 12 mm short axis
previously 13 mm short axis.

Scattered small lymph nodes elsewhere in the chest with similar
appearance including paraesophageal lymph nodes

Lungs/Pleura: LEFT upper lobe pulmonary mass (image 25, series [DATE] x 3.0 cm in greatest axial dimension previously approximately
3.4 x 2.6 cm. When measured in the coronal plane greatest dimension
is approximately 4.3 cm as compared to 3.9 cm.

Innumerable nodules ranging from 2-3 mm to 1 cm size.

LEFT lower lobe pulmonary nodule (image 105, series 6) 14 mm,
stable.

LEFT lower lobe pulmonary nodule along the pleura (image 87, series
6) 15 mm previously 11 mm.

Multiplicity of nodules makes detection of new nodules difficult if
not impossible.

Nodule in the RIGHT middle lobe (image 80, series 6) 10 mm as
compared to approximately 12 mm greatest dimension.

Signs of septal thickening though subtle are stable.

Musculoskeletal: Widespread bony metastatic disease, similar about
the bony thorax when compared to the prior study. See below for full
musculoskeletal detail.

CT ABDOMEN PELVIS FINDINGS

Hepatobiliary: Mild intra and extrahepatic biliary duct distension
is similar to the prior exam and not associated with pericholecystic
stranding or gallbladder wall thickening. The no focal, suspicious
hepatic lesion.

Stable tiny hypodensity along the margin of the RIGHT hepatic lobe
is unchanged dating back to at least [DATE] measuring
approximately 6 mm.

Pancreas: Normal, without mass, inflammation or ductal dilatation.

Spleen: Normal

Adrenals/Urinary Tract: Adrenal glands are normal.

Symmetric renal enhancement. Cyst in the upper pole the LEFT kidney.
No hydronephrosis. Urinary bladder under distended limiting
assessment. Prostate with impression upon the bladder base in the
setting of marked prostatomegaly.

Stomach/Bowel: No acute gastrointestinal process.  Normal appendix.

Vascular/Lymphatic: Calcified atheromatous plaque of the abdominal
aorta without aneurysmal dilation. There is no gastrohepatic or
hepatoduodenal ligament lymphadenopathy. No retroperitoneal or
mesenteric lymphadenopathy.

No pelvic sidewall lymphadenopathy.

Reproductive: Prostatomegaly and heterogeneity similar to the prior
exam.

Other: No ascites

Musculoskeletal: RIGHT scapular sclerosis is subtle and unchanged.
Scattered areas of bony sclerosis elsewhere about the bony thorax
with similar appearance.

T8 lesion along the inferior endplate with destructive changes and
bony sclerosis surrounding lytic process centrally is unchanged at
approximately 13 mm.

Sclerotic lesion at L1 is unchanged.

Subtle sclerosis in the RIGHT ischium and scattered foci of
sclerosis about the bony pelvis with similar appearance.
IMPRESSION: 1. Slight interval increase in size of the LEFT upper lobe pulmonary
mass. Differences size is not as dramatic when measured in the
coronal plane.
2. Dominant area in the LEFT lower lobe along the periphery may be
slightly larger. On balance, other pulmonary nodules are stable.
3. Widespread bony metastatic disease with similar appearance.
4. Unchanged appearance of mediastinal lymph nodes.
5. No evidence of metastatic disease to the abdomen or pelvis.
6. Prostatomegaly and heterogeneity similar to the prior exam.
7. Stable nonspecific biliary duct distension. Correlate with any
abdominal symptoms or laboratory abnormalities that would suggest
biliary obstruction. Since [DATE]. Gallbladder is no longer
hyperenhancing.
8. Three-vessel coronary artery disease.
9. Aortic atherosclerosis.

Aortic Atherosclerosis ([CC]-[CC]).

## 2020-10-12 MED ORDER — IOHEXOL 300 MG/ML  SOLN
100.0000 mL | Freq: Once | INTRAMUSCULAR | Status: AC | PRN
  Administered 2020-10-12: 100 mL via INTRAVENOUS

## 2020-10-13 ENCOUNTER — Inpatient Hospital Stay: Payer: Medicare PPO

## 2020-10-13 ENCOUNTER — Other Ambulatory Visit: Payer: Self-pay

## 2020-10-13 ENCOUNTER — Inpatient Hospital Stay: Payer: Medicare PPO | Admitting: Physician Assistant

## 2020-10-13 VITALS — BP 115/49 | HR 75 | Temp 97.0°F | Resp 14 | Ht 69.0 in | Wt 145.6 lb

## 2020-10-13 DIAGNOSIS — F329 Major depressive disorder, single episode, unspecified: Secondary | ICD-10-CM | POA: Diagnosis not present

## 2020-10-13 DIAGNOSIS — Z5111 Encounter for antineoplastic chemotherapy: Secondary | ICD-10-CM | POA: Diagnosis not present

## 2020-10-13 DIAGNOSIS — F32A Depression, unspecified: Secondary | ICD-10-CM | POA: Diagnosis not present

## 2020-10-13 DIAGNOSIS — Z5112 Encounter for antineoplastic immunotherapy: Secondary | ICD-10-CM

## 2020-10-13 DIAGNOSIS — C7931 Secondary malignant neoplasm of brain: Secondary | ICD-10-CM | POA: Diagnosis not present

## 2020-10-13 DIAGNOSIS — C3492 Malignant neoplasm of unspecified part of left bronchus or lung: Secondary | ICD-10-CM

## 2020-10-13 DIAGNOSIS — Z79899 Other long term (current) drug therapy: Secondary | ICD-10-CM | POA: Diagnosis not present

## 2020-10-13 DIAGNOSIS — C7951 Secondary malignant neoplasm of bone: Secondary | ICD-10-CM | POA: Diagnosis not present

## 2020-10-13 DIAGNOSIS — R5383 Other fatigue: Secondary | ICD-10-CM | POA: Diagnosis not present

## 2020-10-13 DIAGNOSIS — C3490 Malignant neoplasm of unspecified part of unspecified bronchus or lung: Secondary | ICD-10-CM | POA: Diagnosis not present

## 2020-10-13 LAB — CMP (CANCER CENTER ONLY)
ALT: 13 U/L (ref 0–44)
AST: 23 U/L (ref 15–41)
Albumin: 3 g/dL — ABNORMAL LOW (ref 3.5–5.0)
Alkaline Phosphatase: 122 U/L (ref 38–126)
Anion gap: 7 (ref 5–15)
BUN: 7 mg/dL — ABNORMAL LOW (ref 8–23)
CO2: 25 mmol/L (ref 22–32)
Calcium: 9.2 mg/dL (ref 8.9–10.3)
Chloride: 105 mmol/L (ref 98–111)
Creatinine: 1 mg/dL (ref 0.61–1.24)
GFR, Estimated: >60 mL/min (ref >60)
Glucose, Bld: 140 mg/dL — ABNORMAL HIGH (ref 70–99)
Potassium: 3.6 mmol/L (ref 3.5–5.1)
Sodium: 137 mmol/L (ref 135–145)
Total Bilirubin: 0.4 mg/dL (ref 0.3–1.2)
Total Protein: 7 g/dL (ref 6.5–8.1)

## 2020-10-13 LAB — CBC WITH DIFFERENTIAL (CANCER CENTER ONLY)
Abs Immature Granulocytes: 0.01 10*3/uL (ref 0.00–0.07)
Basophils Absolute: 0 10*3/uL (ref 0.0–0.1)
Basophils Relative: 1 %
Eosinophils Absolute: 0.2 10*3/uL (ref 0.0–0.5)
Eosinophils Relative: 3 %
HCT: 33.4 % — ABNORMAL LOW (ref 39.0–52.0)
Hemoglobin: 10.9 g/dL — ABNORMAL LOW (ref 13.0–17.0)
Immature Granulocytes: 0 %
Lymphocytes Relative: 18 %
Lymphs Abs: 1 10*3/uL (ref 0.7–4.0)
MCH: 32 pg (ref 26.0–34.0)
MCHC: 32.6 g/dL (ref 30.0–36.0)
MCV: 97.9 fL (ref 80.0–100.0)
Monocytes Absolute: 0.6 10*3/uL (ref 0.1–1.0)
Monocytes Relative: 11 %
Neutro Abs: 3.6 10*3/uL (ref 1.7–7.7)
Neutrophils Relative %: 67 %
Platelet Count: 204 10*3/uL (ref 150–400)
RBC: 3.41 MIL/uL — ABNORMAL LOW (ref 4.22–5.81)
RDW: 15.6 % — ABNORMAL HIGH (ref 11.5–15.5)
WBC Count: 5.4 10*3/uL (ref 4.0–10.5)
nRBC: 0 % (ref 0.0–0.2)

## 2020-10-13 LAB — TSH: TSH: 0.482 u[IU]/mL (ref 0.320–4.118)

## 2020-10-13 MED ORDER — PROCHLORPERAZINE MALEATE 10 MG PO TABS
ORAL_TABLET | ORAL | Status: AC
  Filled 2020-10-13: qty 1

## 2020-10-13 MED ORDER — SODIUM CHLORIDE 0.9 % IV SOLN
500.0000 mg/m2 | Freq: Once | INTRAVENOUS | Status: AC
  Administered 2020-10-13: 900 mg via INTRAVENOUS
  Filled 2020-10-13: qty 20

## 2020-10-13 MED ORDER — SODIUM CHLORIDE 0.9 % IV SOLN
200.0000 mg | Freq: Once | INTRAVENOUS | Status: AC
  Administered 2020-10-13: 200 mg via INTRAVENOUS
  Filled 2020-10-13: qty 8

## 2020-10-13 MED ORDER — SODIUM CHLORIDE 0.9 % IV SOLN
Freq: Once | INTRAVENOUS | Status: AC
  Filled 2020-10-13: qty 250

## 2020-10-13 MED ORDER — PROCHLORPERAZINE MALEATE 10 MG PO TABS
10.0000 mg | ORAL_TABLET | Freq: Once | ORAL | Status: AC
Start: 2020-10-13 — End: 2020-10-13
  Administered 2020-10-13: 10 mg via ORAL

## 2020-10-13 NOTE — Patient Instructions (Signed)
[facility] Discharge Instructions for Patients Receiving Chemotherapy  Today you received the following chemotherapy agents Pembrolizumab (KEYTRUDA) & Pemetrexed (ALIMTA).  To help prevent nausea and vomiting after your treatment, we encourage you to take your nausea medication as prescribed.   If you develop nausea and vomiting that is not controlled by your nausea medication, call the clinic.   BELOW ARE SYMPTOMS THAT SHOULD BE REPORTED IMMEDIATELY:  *FEVER GREATER THAN 100.5 F  *CHILLS WITH OR WITHOUT FEVER  NAUSEA AND VOMITING THAT IS NOT CONTROLLED WITH YOUR NAUSEA MEDICATION  *UNUSUAL SHORTNESS OF BREATH  *UNUSUAL BRUISING OR BLEEDING  TENDERNESS IN MOUTH AND THROAT WITH OR WITHOUT PRESENCE OF ULCERS  *URINARY PROBLEMS  *BOWEL PROBLEMS  UNUSUAL RASH Items with * indicate a potential emergency and should be followed up as soon as possible.  Feel free to call the clinic should you have any questions or concerns. The clinic phone number is [phone].  Please show the [facility] at check-in to the Emergency Department and triage nurse.

## 2020-10-14 ENCOUNTER — Telehealth: Payer: Self-pay | Admitting: Physician Assistant

## 2020-10-14 NOTE — Telephone Encounter (Signed)
Scheduled appointments per 1/25 los. Will have updated calendar printed for patient at next visit.

## 2020-11-03 ENCOUNTER — Inpatient Hospital Stay: Payer: Medicare PPO | Attending: Internal Medicine | Admitting: Internal Medicine

## 2020-11-03 ENCOUNTER — Inpatient Hospital Stay: Payer: Medicare PPO

## 2020-11-03 ENCOUNTER — Other Ambulatory Visit: Payer: Self-pay

## 2020-11-03 ENCOUNTER — Encounter: Payer: Self-pay | Admitting: Internal Medicine

## 2020-11-03 VITALS — BP 132/52 | HR 85 | Temp 97.6°F | Resp 16 | Ht 69.0 in | Wt 145.5 lb

## 2020-11-03 VITALS — BP 114/48 | HR 66 | Temp 98.1°F | Resp 18

## 2020-11-03 DIAGNOSIS — F329 Major depressive disorder, single episode, unspecified: Secondary | ICD-10-CM | POA: Diagnosis not present

## 2020-11-03 DIAGNOSIS — C3492 Malignant neoplasm of unspecified part of left bronchus or lung: Secondary | ICD-10-CM

## 2020-11-03 DIAGNOSIS — C7951 Secondary malignant neoplasm of bone: Secondary | ICD-10-CM | POA: Diagnosis not present

## 2020-11-03 DIAGNOSIS — C3490 Malignant neoplasm of unspecified part of unspecified bronchus or lung: Secondary | ICD-10-CM | POA: Insufficient documentation

## 2020-11-03 DIAGNOSIS — R5383 Other fatigue: Secondary | ICD-10-CM | POA: Insufficient documentation

## 2020-11-03 DIAGNOSIS — Z5111 Encounter for antineoplastic chemotherapy: Secondary | ICD-10-CM | POA: Insufficient documentation

## 2020-11-03 DIAGNOSIS — C7931 Secondary malignant neoplasm of brain: Secondary | ICD-10-CM | POA: Diagnosis not present

## 2020-11-03 DIAGNOSIS — Z87891 Personal history of nicotine dependence: Secondary | ICD-10-CM | POA: Diagnosis not present

## 2020-11-03 DIAGNOSIS — Z5112 Encounter for antineoplastic immunotherapy: Secondary | ICD-10-CM | POA: Diagnosis not present

## 2020-11-03 DIAGNOSIS — Z79899 Other long term (current) drug therapy: Secondary | ICD-10-CM | POA: Insufficient documentation

## 2020-11-03 LAB — CBC WITH DIFFERENTIAL (CANCER CENTER ONLY)
Abs Immature Granulocytes: 0.02 10*3/uL (ref 0.00–0.07)
Basophils Absolute: 0 10*3/uL (ref 0.0–0.1)
Basophils Relative: 1 %
Eosinophils Absolute: 0.1 10*3/uL (ref 0.0–0.5)
Eosinophils Relative: 3 %
HCT: 31.6 % — ABNORMAL LOW (ref 39.0–52.0)
Hemoglobin: 10.1 g/dL — ABNORMAL LOW (ref 13.0–17.0)
Immature Granulocytes: 0 %
Lymphocytes Relative: 20 %
Lymphs Abs: 1 10*3/uL (ref 0.7–4.0)
MCH: 30.9 pg (ref 26.0–34.0)
MCHC: 32 g/dL (ref 30.0–36.0)
MCV: 96.6 fL (ref 80.0–100.0)
Monocytes Absolute: 0.7 10*3/uL (ref 0.1–1.0)
Monocytes Relative: 14 %
Neutro Abs: 3.1 10*3/uL (ref 1.7–7.7)
Neutrophils Relative %: 62 %
Platelet Count: 285 10*3/uL (ref 150–400)
RBC: 3.27 MIL/uL — ABNORMAL LOW (ref 4.22–5.81)
RDW: 14.8 % (ref 11.5–15.5)
WBC Count: 5 10*3/uL (ref 4.0–10.5)
nRBC: 0 % (ref 0.0–0.2)

## 2020-11-03 LAB — CMP (CANCER CENTER ONLY)
ALT: 12 U/L (ref 0–44)
AST: 21 U/L (ref 15–41)
Albumin: 2.7 g/dL — ABNORMAL LOW (ref 3.5–5.0)
Alkaline Phosphatase: 124 U/L (ref 38–126)
Anion gap: 8 (ref 5–15)
BUN: 9 mg/dL (ref 8–23)
CO2: 23 mmol/L (ref 22–32)
Calcium: 9.1 mg/dL (ref 8.9–10.3)
Chloride: 106 mmol/L (ref 98–111)
Creatinine: 1.03 mg/dL (ref 0.61–1.24)
GFR, Estimated: >60 mL/min (ref >60)
Glucose, Bld: 170 mg/dL — ABNORMAL HIGH (ref 70–99)
Potassium: 3.2 mmol/L — ABNORMAL LOW (ref 3.5–5.1)
Sodium: 137 mmol/L (ref 135–145)
Total Bilirubin: 0.3 mg/dL (ref 0.3–1.2)
Total Protein: 6.9 g/dL (ref 6.5–8.1)

## 2020-11-03 LAB — TSH: TSH: 0.482 u[IU]/mL (ref 0.320–4.118)

## 2020-11-03 MED ORDER — SODIUM CHLORIDE 0.9 % IV SOLN
200.0000 mg | Freq: Once | INTRAVENOUS | Status: AC
  Administered 2020-11-03: 200 mg via INTRAVENOUS
  Filled 2020-11-03: qty 8

## 2020-11-03 MED ORDER — SODIUM CHLORIDE 0.9 % IV SOLN
Freq: Once | INTRAVENOUS | Status: AC
  Filled 2020-11-03: qty 250

## 2020-11-03 MED ORDER — SODIUM CHLORIDE 0.9 % IV SOLN
500.0000 mg/m2 | Freq: Once | INTRAVENOUS | Status: AC
  Administered 2020-11-03: 900 mg via INTRAVENOUS
  Filled 2020-11-03: qty 20

## 2020-11-03 MED ORDER — PROCHLORPERAZINE MALEATE 10 MG PO TABS
ORAL_TABLET | ORAL | Status: AC
  Filled 2020-11-03: qty 1

## 2020-11-03 MED ORDER — PROCHLORPERAZINE MALEATE 10 MG PO TABS
10.0000 mg | ORAL_TABLET | Freq: Once | ORAL | Status: AC
  Administered 2020-11-03: 10 mg via ORAL

## 2020-11-03 NOTE — Patient Instructions (Signed)
[facility] Discharge Instructions for Patients Receiving Chemotherapy  Today you received the following chemotherapy agents keytruda, alimta  To help prevent nausea and vomiting after your treatment, we encourage you to take your nausea medication as directed.   If you develop nausea and vomiting that is not controlled by your nausea medication, call the clinic.   BELOW ARE SYMPTOMS THAT SHOULD BE REPORTED IMMEDIATELY:  *FEVER GREATER THAN 100.5 F  *CHILLS WITH OR WITHOUT FEVER  NAUSEA AND VOMITING THAT IS NOT CONTROLLED WITH YOUR NAUSEA MEDICATION  *UNUSUAL SHORTNESS OF BREATH  *UNUSUAL BRUISING OR BLEEDING  TENDERNESS IN MOUTH AND THROAT WITH OR WITHOUT PRESENCE OF ULCERS  *URINARY PROBLEMS  *BOWEL PROBLEMS  UNUSUAL RASH Items with * indicate a potential emergency and should be followed up as soon as possible.  Feel free to call the clinic should you have any questions or concerns. The clinic phone number is [phone].  Please show the [facility] at check-in to the Emergency Department and triage nurse.

## 2020-11-03 NOTE — Progress Notes (Signed)
[place] [phone]   [phone]  OFFICE PROGRESS NOTE  Seward Carol, MD [address] [place] [address]  DIAGNOSIS: Stage IV (T4, N3, M1c) non-small cell lung cancer, adenocarcinoma presented with left upper lobe lung mass in addition to bilateral mediastinal lymphadenopathy as well as bilateral pulmonary nodules and metastatic disease to the bones and brain diagnosed in July 2021.  Molecular studies by [facility] [employer] showed no actionable mutations.  PRIOR THERAPY: None  CURRENT THERAPY: Systemic chemotherapy with carboplatin for AUC of 5, Alimta 500 mg/M2 and Keytruda 200 mg IV every 3 weeks.  First dose 04/06/2020.  Status post 10 cycles.  Starting from cycle #5 he is on maintenance treatment with Alimta and Keytruda every 3 weeks.  INTERVAL HISTORY: Phillip Franklin 80 y.o. male returns to the clinic today for follow-up visit accompanied by his wife [name].  The patient is feeling fine today with no concerning complaints except for the fatigue.  He denied having any current chest pain, shortness of breath, cough or hemoptysis.  He denied having any fever or chills.  He has no nausea, vomiting, diarrhea or constipation.  He has no headache or visual changes.  He denied having any recent weight loss or night sweats.  He continues to tolerate his treatment with Alimta and Keytruda fairly well.  The patient is here today for evaluation before starting cycle #11.  MEDICAL HISTORY: Past Medical History:  Diagnosis Date  . Depression   . GERD (gastroesophageal reflux disease)    occasional, diet controlled  . High cholesterol   . Hypertension    no longer taking medication - states it's undercontrol  . Hypothyroid     ALLERGIES:  is allergic to codeine, hydrocodone, oxycodone, and penicillins.  MEDICATIONS:  Current Outpatient Medications  Medication Sig Dispense Refill  . acetaminophen (TYLENOL) 325 MG tablet Take 325-650  mg by mouth every 6 (six) hours as needed for moderate pain or headache.    Marland Kitchen amoxicillin-clavulanate (AUGMENTIN) 875-125 MG tablet Take 1 tablet by mouth 2 (two) times daily.    Marland Kitchen atorvastatin (LIPITOR) 20 MG tablet Take 20 mg by mouth daily.    . benzonatate (TESSALON) 200 MG capsule Take 1 capsule (200 mg total) by mouth 4 (four) times daily as needed for cough. 60 capsule 1  . chlorhexidine (PERIDEX) 0.12 % solution RINSE WITH 15ML THREE TIMES DAILY FOR MOUTH RINSE FOR 1 MINUTE THEN EXPECTORATE    . citalopram (CELEXA) 20 MG tablet Take 1 tablet (20 mg total) by mouth daily. 30 tablet 3  . dronabinol (MARINOL) 2.5 MG capsule Take 1 capsule (2.5 mg total) by mouth 2 (two) times daily before a meal. (Patient taking differently: Take 2.5 mg by mouth 2 (two) times daily before a meal. Patient is only taking once a day) 60 capsule 2  . esomeprazole (NEXIUM) 20 MG capsule Take 20 mg by mouth daily as needed (acid reflux).     . folic acid (FOLVITE) 1 MG tablet Take 1 tablet by mouth once daily 30 tablet 0  . HYDROmorphone (DILAUDID) 2 MG tablet Take 2 mg by mouth 4 (four) times daily.    Marland Kitchen levothyroxine (SYNTHROID) 88 MCG tablet Take 88 mcg by mouth daily before breakfast.    . LORazepam (ATIVAN) 0.5 MG tablet Take 1-2 tablets by mouth 30 minutes before procedures, PRN anxiety. 8 tablet 0  . Multiple Vitamins-Minerals (CENTRUM SILVER 50+MEN) TABS Take 1 tablet by  mouth daily.    Marland Kitchen neomycin-polymyxin b-dexamethasone (MAXITROL) 3.5-10000-0.1 SUSP Place 1 drop into the right eye every 8 (eight) hours.    . potassium chloride SA (KLOR-CON) 20 MEQ tablet Take 1 tablet (20 mEq total) by mouth daily. 6 tablet 0  . prochlorperazine (COMPAZINE) 10 MG tablet Take 1 tablet (10 mg total) by mouth every 6 (six) hours as needed for nausea or vomiting. 30 tablet 0  . sulfamethoxazole-trimethoprim (BACTRIM DS) 800-160 MG tablet Take 1 tablet by mouth 2 (two) times daily. 14 tablet 0  . tamsulosin (FLOMAX) 0.4 MG CAPS  capsule Take 0.4 mg by mouth every evening.     . traMADol (ULTRAM) 50 MG tablet Take 1 tablet (50 mg total) by mouth every 6 (six) hours as needed. 30 tablet 0   No current facility-administered medications for this visit.    SURGICAL HISTORY:  Past Surgical History:  Procedure Laterality Date  . BRONCHIAL NEEDLE ASPIRATION BIOPSY  03/24/2020   Procedure: BRONCHIAL NEEDLE ASPIRATION BIOPSIES;  Surgeon: Candee Furbish, MD;  Location: [facility] ENDOSCOPY;  Service: Pulmonary;;  . HEMORRHOID SURGERY    . VIDEO BRONCHOSCOPY WITH ENDOBRONCHIAL ULTRASOUND N/A 03/24/2020   Procedure: VIDEO BRONCHOSCOPY WITH ENDOBRONCHIAL ULTRASOUND;  Surgeon: Candee Furbish, MD;  Location: [facility] ENDOSCOPY;  Service: Pulmonary;  Laterality: N/A;    REVIEW OF SYSTEMS:  A comprehensive review of systems was negative except for: Constitutional: positive for fatigue   PHYSICAL EXAMINATION: General appearance: alert, cooperative, fatigued and no distress Head: Normocephalic, without obvious abnormality, atraumatic Neck: no adenopathy, no JVD, supple, symmetrical, trachea midline and thyroid not enlarged, symmetric, no tenderness/mass/nodules Lymph nodes: Cervical, supraclavicular, and axillary nodes normal. Resp: clear to auscultation bilaterally Back: symmetric, no curvature. ROM normal. No CVA tenderness. Cardio: regular rate and rhythm, S1, S2 normal, no murmur, click, rub or gallop GI: soft, non-tender; bowel sounds normal; no masses,  no organomegaly Extremities: extremities normal, atraumatic, no cyanosis or edema  ECOG PERFORMANCE STATUS: 1 - Symptomatic but completely ambulatory  Blood pressure (!) 132/52, pulse 85, temperature 97.6 F (36.4 C), temperature source Tympanic, resp. rate 16, height 5\' 9"  (1.753 m), weight 145 lb 8 oz (66 kg), SpO2 100 %.  LABORATORY DATA: Lab Results  Component Value Date   WBC 5.0 11/03/2020   HGB 10.1 (L) 11/03/2020   HCT 31.6 (L) 11/03/2020   MCV 96.6 11/03/2020   PLT 285  11/03/2020      Chemistry      Component Value Date/Time   NA 137 11/03/2020 1107   K 3.2 (L) 11/03/2020 1107   CL 106 11/03/2020 1107   CO2 23 11/03/2020 1107   BUN 9 11/03/2020 1107   CREATININE 1.03 11/03/2020 1107      Component Value Date/Time   CALCIUM 9.1 11/03/2020 1107   ALKPHOS 124 11/03/2020 1107   AST 21 11/03/2020 1107   ALT 12 11/03/2020 1107   BILITOT 0.3 11/03/2020 1107       RADIOGRAPHIC STUDIES: CT Chest W Contrast  Result Date: 10/12/2020 CLINICAL DATA:  Non-small cell lung cancer staging. Ongoing oral therapy by report. Stage IV disease in this 80 year old male, follow-up evaluation. EXAM: CT CHEST, ABDOMEN, AND PELVIS WITH CONTRAST TECHNIQUE: Multidetector CT imaging of the chest, abdomen and pelvis was performed following the standard protocol during bolus administration of intravenous contrast. CONTRAST:  154mL OMNIPAQUE IOHEXOL 300 MG/ML  SOLN COMPARISON:  August 07, 2020 FINDINGS: CT CHEST FINDINGS Cardiovascular: Calcified and noncalcified atheromatous plaque in the thoracic aorta. No  aneurysmal dilation. Three-vessel coronary artery disease. Normal heart size. Central pulmonary vasculature normal caliber. Mediastinum/Nodes: High RIGHT paratracheal lymph node (image 15, series 2) approximately 9 mm short axis previously 8 mm short axis. Subcarinal nodal tissue (image 28, series 2) 12 mm short axis previously 13 mm short axis. Scattered small lymph nodes elsewhere in the chest with similar appearance including paraesophageal lymph nodes Lungs/Pleura: LEFT upper lobe pulmonary mass (image 25, series 6) 4.5 x 3.0 cm in greatest axial dimension previously approximately 3.4 x 2.6 cm. When measured in the coronal plane greatest dimension is approximately 4.3 cm as compared to 3.9 cm. Innumerable nodules ranging from 2-3 mm to 1 cm size. LEFT lower lobe pulmonary nodule (image 105, series 6) 14 mm, stable. LEFT lower lobe pulmonary nodule along the pleura (image 87,  series 6) 15 mm previously 11 mm. Multiplicity of nodules makes detection of new nodules difficult if not impossible. Nodule in the RIGHT middle lobe (image 80, series 6) 10 mm as compared to approximately 12 mm greatest dimension. Signs of septal thickening though subtle are stable. Musculoskeletal: Widespread bony metastatic disease, similar about the bony thorax when compared to the prior study. See below for full musculoskeletal detail. CT ABDOMEN PELVIS FINDINGS Hepatobiliary: Mild intra and extrahepatic biliary duct distension is similar to the prior exam and not associated with pericholecystic stranding or gallbladder wall thickening. The no focal, suspicious hepatic lesion. Stable tiny hypodensity along the margin of the RIGHT hepatic lobe is unchanged dating back to at least June of 2021 measuring approximately 6 mm. Pancreas: Normal, without mass, inflammation or ductal dilatation. Spleen: Normal Adrenals/Urinary Tract: Adrenal glands are normal. Symmetric renal enhancement. Cyst in the upper pole the LEFT kidney. No hydronephrosis. Urinary bladder under distended limiting assessment. Prostate with impression upon the bladder base in the setting of marked prostatomegaly. Stomach/Bowel: No acute gastrointestinal process.  Normal appendix. Vascular/Lymphatic: Calcified atheromatous plaque of the abdominal aorta without aneurysmal dilation. There is no gastrohepatic or hepatoduodenal ligament lymphadenopathy. No retroperitoneal or mesenteric lymphadenopathy. No pelvic sidewall lymphadenopathy. Reproductive: Prostatomegaly and heterogeneity similar to the prior exam. Other: No ascites Musculoskeletal: RIGHT scapular sclerosis is subtle and unchanged. Scattered areas of bony sclerosis elsewhere about the bony thorax with similar appearance. T8 lesion along the inferior endplate with destructive changes and bony sclerosis surrounding lytic process centrally is unchanged at approximately 13 mm. Sclerotic lesion  at L1 is unchanged. Subtle sclerosis in the RIGHT ischium and scattered foci of sclerosis about the bony pelvis with similar appearance. IMPRESSION: 1. Slight interval increase in size of the LEFT upper lobe pulmonary mass. Differences size is not as dramatic when measured in the coronal plane. 2. Dominant area in the LEFT lower lobe along the periphery may be slightly larger. On balance, other pulmonary nodules are stable. 3. Widespread bony metastatic disease with similar appearance. 4. Unchanged appearance of mediastinal lymph nodes. 5. No evidence of metastatic disease to the abdomen or pelvis. 6. Prostatomegaly and heterogeneity similar to the prior exam. 7. Stable nonspecific biliary duct distension. Correlate with any abdominal symptoms or laboratory abnormalities that would suggest biliary obstruction. Since June of 2021. Gallbladder is no longer hyperenhancing. 8. Three-vessel coronary artery disease. 9. Aortic atherosclerosis. Aortic Atherosclerosis (ICD10-I70.0). Electronically Signed   By: Zetta Bills M.D.   On: 10/12/2020 17:10   CT Abdomen Pelvis W Contrast  Result Date: 10/12/2020 CLINICAL DATA:  Non-small cell lung cancer staging. Ongoing oral therapy by report. Stage IV disease in this 80 year old male, follow-up evaluation.  EXAM: CT CHEST, ABDOMEN, AND PELVIS WITH CONTRAST TECHNIQUE: Multidetector CT imaging of the chest, abdomen and pelvis was performed following the standard protocol during bolus administration of intravenous contrast. CONTRAST:  155mL OMNIPAQUE IOHEXOL 300 MG/ML  SOLN COMPARISON:  August 07, 2020 FINDINGS: CT CHEST FINDINGS Cardiovascular: Calcified and noncalcified atheromatous plaque in the thoracic aorta. No aneurysmal dilation. Three-vessel coronary artery disease. Normal heart size. Central pulmonary vasculature normal caliber. Mediastinum/Nodes: High RIGHT paratracheal lymph node (image 15, series 2) approximately 9 mm short axis previously 8 mm short axis.  Subcarinal nodal tissue (image 28, series 2) 12 mm short axis previously 13 mm short axis. Scattered small lymph nodes elsewhere in the chest with similar appearance including paraesophageal lymph nodes Lungs/Pleura: LEFT upper lobe pulmonary mass (image 25, series 6) 4.5 x 3.0 cm in greatest axial dimension previously approximately 3.4 x 2.6 cm. When measured in the coronal plane greatest dimension is approximately 4.3 cm as compared to 3.9 cm. Innumerable nodules ranging from 2-3 mm to 1 cm size. LEFT lower lobe pulmonary nodule (image 105, series 6) 14 mm, stable. LEFT lower lobe pulmonary nodule along the pleura (image 87, series 6) 15 mm previously 11 mm. Multiplicity of nodules makes detection of new nodules difficult if not impossible. Nodule in the RIGHT middle lobe (image 80, series 6) 10 mm as compared to approximately 12 mm greatest dimension. Signs of septal thickening though subtle are stable. Musculoskeletal: Widespread bony metastatic disease, similar about the bony thorax when compared to the prior study. See below for full musculoskeletal detail. CT ABDOMEN PELVIS FINDINGS Hepatobiliary: Mild intra and extrahepatic biliary duct distension is similar to the prior exam and not associated with pericholecystic stranding or gallbladder wall thickening. The no focal, suspicious hepatic lesion. Stable tiny hypodensity along the margin of the RIGHT hepatic lobe is unchanged dating back to at least June of 2021 measuring approximately 6 mm. Pancreas: Normal, without mass, inflammation or ductal dilatation. Spleen: Normal Adrenals/Urinary Tract: Adrenal glands are normal. Symmetric renal enhancement. Cyst in the upper pole the LEFT kidney. No hydronephrosis. Urinary bladder under distended limiting assessment. Prostate with impression upon the bladder base in the setting of marked prostatomegaly. Stomach/Bowel: No acute gastrointestinal process.  Normal appendix. Vascular/Lymphatic: Calcified atheromatous  plaque of the abdominal aorta without aneurysmal dilation. There is no gastrohepatic or hepatoduodenal ligament lymphadenopathy. No retroperitoneal or mesenteric lymphadenopathy. No pelvic sidewall lymphadenopathy. Reproductive: Prostatomegaly and heterogeneity similar to the prior exam. Other: No ascites Musculoskeletal: RIGHT scapular sclerosis is subtle and unchanged. Scattered areas of bony sclerosis elsewhere about the bony thorax with similar appearance. T8 lesion along the inferior endplate with destructive changes and bony sclerosis surrounding lytic process centrally is unchanged at approximately 13 mm. Sclerotic lesion at L1 is unchanged. Subtle sclerosis in the RIGHT ischium and scattered foci of sclerosis about the bony pelvis with similar appearance. IMPRESSION: 1. Slight interval increase in size of the LEFT upper lobe pulmonary mass. Differences size is not as dramatic when measured in the coronal plane. 2. Dominant area in the LEFT lower lobe along the periphery may be slightly larger. On balance, other pulmonary nodules are stable. 3. Widespread bony metastatic disease with similar appearance. 4. Unchanged appearance of mediastinal lymph nodes. 5. No evidence of metastatic disease to the abdomen or pelvis. 6. Prostatomegaly and heterogeneity similar to the prior exam. 7. Stable nonspecific biliary duct distension. Correlate with any abdominal symptoms or laboratory abnormalities that would suggest biliary obstruction. Since June of 2021. Gallbladder is no longer  hyperenhancing. 8. Three-vessel coronary artery disease. 9. Aortic atherosclerosis. Aortic Atherosclerosis (ICD10-I70.0). Electronically Signed   By: Zetta Bills M.D.   On: 10/12/2020 17:10    ASSESSMENT AND PLAN: This is a very pleasant 80 years old white male recently diagnosed with a stage IV (T4, N3, M1C) non-small cell lung cancer, adenocarcinoma presented with left upper lobe lung mass in addition to bilateral pulmonary nodules  as well as bilateral hilar and mediastinal lymphadenopathy and metastatic disease to the bones and brain diagnosed in July 2021. He underwent stereotactic radiotherapy to the metastatic brain lesion under the care of Dr. Isidore Moos. Unfortunately the molecular studies by [employer] was negative for actionable mutations.  The initial tissue biopsy was insufficient for molecular studies by [facility]. The patient had repeat biopsy for the molecular studies and the expectation is to have the result on 05/05/2020. He started systemic chemotherapy with carboplatin for AUC of 5, Alimta 500 mg/M2 and Keytruda 200 mg IV every 3 weeks status post 10 cycles.  Starting from cycle #5 he is on maintenance treatment with Alimta and Keytruda.   The patient continues to tolerate his treatment well with no concerning adverse effects. I recommended for him to proceed with cycle #11 today as planned. I will see him back for follow-up visit in 3 weeks for evaluation before starting cycle #12. For the depression he is currently on Cymbalta. The patient was advised to call immediately if he has any other concerning symptoms in the interval.  Disclaimer: This note was dictated with voice recognition software. Similar sounding words can inadvertently be transcribed and may not be corrected upon review.

## 2020-11-03 NOTE — Addendum Note (Signed)
Addended by: Ardeen Garland on: 11/03/2020 12:24 PM   Modules accepted: Orders

## 2020-11-17 ENCOUNTER — Other Ambulatory Visit: Payer: Self-pay | Admitting: Physician Assistant

## 2020-11-17 ENCOUNTER — Other Ambulatory Visit: Payer: Self-pay | Admitting: Internal Medicine

## 2020-11-17 DIAGNOSIS — R634 Abnormal weight loss: Secondary | ICD-10-CM

## 2020-11-20 NOTE — Progress Notes (Signed)
[facility] OFFICE PROGRESS NOTE  Seward Carol, MD [address] [place] [address]  DIAGNOSIS: Stage IV (T4, N3, M1c) non-small cell lung cancer, adenocarcinoma presented with left upper lobe lung mass in addition to bilateral mediastinal lymphadenopathy as well as bilateral pulmonary nodules and metastatic disease to the bones and brain diagnosed in July 2021.  Molecular studies by [employer]showed no actionable mutations.  PRIOR THERAPY: SRS to the metastatic brain lesion under the care of Dr. Isidore Moos on 05/04/20  CURRENT THERAPY: Palliative systemic chemotherapy with carboplatin for an AUC of 5, Alimta 500 mg per metered squared, Keytruda 200 mg IV every 3 weeks. Status post11 cycles. First dose 04/06/20. Starting from cycle # 5, the patient has been on maintenance Alimta and Keytruda.  INTERVAL HISTORY: Phillip Franklin 80 y.o. male returns to the clinic today for a follow-up visit accompanied by his wife.  The patient is feeling fair today without any concerning complaints except for fatigue. He also needs dental work done and they are inquiring if that is alright with his cancer treatment and if there is any particular instructions needed from our standpoint. The patient denies any recent fever, chills, or night sweats. His wife thinks that he is eating better. His weight is fairly stable. He denies any chest pain or hemoptysis. He reports his "breathing is fine" and he reports stable dyspnea on exertion.  He denies any diarrhea or constipation. He denies nausea or vomiting. He denies any headache or visual changes. [name] scheduled for a repeat brain MRI on 11/27/20 and a follow up with neuro oncology after that.  The patient is here today for evaluation before starting cycle #12  MEDICAL HISTORY: Past Medical History:  Diagnosis Date  . Depression   . GERD (gastroesophageal reflux disease)    occasional, diet controlled  . High cholesterol   .  Hypertension    no longer taking medication - states it's undercontrol  . Hypothyroid     ALLERGIES:  is allergic to codeine, hydrocodone, oxycodone, and penicillins.  MEDICATIONS:  Current Outpatient Medications  Medication Sig Dispense Refill  . acetaminophen (TYLENOL) 325 MG tablet Take 325-650 mg by mouth every 6 (six) hours as needed for moderate pain or headache.    Marland Kitchen atorvastatin (LIPITOR) 20 MG tablet Take 20 mg by mouth daily.    . benzonatate (TESSALON) 200 MG capsule Take 1 capsule (200 mg total) by mouth 4 (four) times daily as needed for cough. 60 capsule 1  . chlorhexidine (PERIDEX) 0.12 % solution RINSE WITH 15ML THREE TIMES DAILY FOR MOUTH RINSE FOR 1 MINUTE THEN EXPECTORATE    . citalopram (CELEXA) 20 MG tablet Take 1 tablet (20 mg total) by mouth daily. 30 tablet 3  . dronabinol (MARINOL) 2.5 MG capsule TAKE 1 CAPSULE BY MOUTH TWICE DAILY BEFORE MEAL(S) 60 capsule 0  . esomeprazole (NEXIUM) 20 MG capsule Take 20 mg by mouth daily as needed (acid reflux).     . folic acid (FOLVITE) 1 MG tablet Take 1 tablet by mouth once daily 30 tablet 0  . HYDROmorphone (DILAUDID) 2 MG tablet Take 2 mg by mouth 4 (four) times daily.    Marland Kitchen levothyroxine (SYNTHROID) 88 MCG tablet Take 88 mcg by mouth daily before breakfast.    . LORazepam (ATIVAN) 0.5 MG tablet Take 1-2 tablets by mouth 30 minutes before procedures, PRN anxiety. 8 tablet 0  . Multiple Vitamins-Minerals (CENTRUM SILVER 50+MEN) TABS Take 1 tablet by mouth  daily.    . neomycin-polymyxin b-dexamethasone (MAXITROL) 3.5-10000-0.1 SUSP Place 1 drop into the right eye every 8 (eight) hours.    . potassium chloride SA (KLOR-CON) 20 MEQ tablet Take 1 tablet (20 mEq total) by mouth daily. 6 tablet 0  . prochlorperazine (COMPAZINE) 10 MG tablet Take 1 tablet (10 mg total) by mouth every 6 (six) hours as needed for nausea or vomiting. 30 tablet 0  . sulfamethoxazole-trimethoprim (BACTRIM DS) 800-160 MG tablet Take 1 tablet by mouth 2  (two) times daily. 14 tablet 0  . tamsulosin (FLOMAX) 0.4 MG CAPS capsule Take 0.4 mg by mouth every evening.     . traMADol (ULTRAM) 50 MG tablet Take 1 tablet (50 mg total) by mouth every 6 (six) hours as needed. 30 tablet 0   No current facility-administered medications for this visit.   Facility-Administered Medications Ordered in Other Visits  Medication Dose Route Frequency Provider Last Rate Last Admin  . 0.9 %  sodium chloride infusion   Intravenous Once Curt Bears, MD      . cyanocobalamin ((VITAMIN B-12)) injection 1,000 mcg  1,000 mcg Intramuscular Once Curt Bears, MD      . pembrolizumab [facility]) 200 mg in sodium chloride 0.9 % 50 mL chemo infusion  200 mg Intravenous Once Curt Bears, MD      . PEMEtrexed (ALIMTA) 900 mg in sodium chloride 0.9 % 100 mL chemo infusion  500 mg/m2 (Treatment Plan Recorded) Intravenous Once Curt Bears, MD      . prochlorperazine (COMPAZINE) tablet 10 mg  10 mg Oral Once Curt Bears, MD        SURGICAL HISTORY:  Past Surgical History:  Procedure Laterality Date  . BRONCHIAL NEEDLE ASPIRATION BIOPSY  03/24/2020   Procedure: BRONCHIAL NEEDLE ASPIRATION BIOPSIES;  Surgeon: Candee Furbish, MD;  Location: [facility] ENDOSCOPY;  Service: Pulmonary;;  . HEMORRHOID SURGERY    . VIDEO BRONCHOSCOPY WITH ENDOBRONCHIAL ULTRASOUND N/A 03/24/2020   Procedure: VIDEO BRONCHOSCOPY WITH ENDOBRONCHIAL ULTRASOUND;  Surgeon: Candee Furbish, MD;  Location: [facility] ENDOSCOPY;  Service: Pulmonary;  Laterality: N/A;    REVIEW OF SYSTEMS:   Review of Systems  Constitutional: Positive for fatigue. Negative for appetite change, chills, fever and unexpected weight change.  HENT: Negative for mouth sores, nosebleeds, sore throat and trouble swallowing.   Eyes: Negative for eye problems and icterus.  Respiratory: Positive for baseline dyspnea on exertion. Negative for cough, hemoptysis, and wheezing.   Cardiovascular: Negative for chest pain and leg swelling.   Gastrointestinal: Negative for abdominal pain, constipation, diarrhea, nausea and vomiting.  Genitourinary: Negative for bladder incontinence, difficulty urinating, dysuria, frequency and hematuria.   Musculoskeletal: Negative for back pain, gait problem, neck pain and neck stiffness.  Skin: Negative for itching and rash.  Neurological: Negative for dizziness, extremity weakness, gait problem, headaches, light-headedness and seizures.  Hematological: Negative for adenopathy. Does not bruise/bleed easily.  Psychiatric/Behavioral: Negative for confusion, depression and sleep disturbance. The patient is not nervous/anxious.    PHYSICAL EXAMINATION:  Blood pressure (!) 120/53, pulse 81, temperature 97.7 F (36.5 C), temperature source Tympanic, resp. rate 16, height 5\' 9"  (1.753 m), weight 144 lb 1.6 oz (65.4 kg), SpO2 98 %.  ECOG PERFORMANCE STATUS: 2  Physical Exam  Constitutional: Oriented to person, place, and time and thin appearing male and in no distress.  HENT:  Head: Normocephalic and atraumatic.  Mouth/Throat: Oropharynx is clear and moist. No oropharyngeal exudate.  Eyes: Conjunctivae are normal. Right eye exhibits no discharge. Left eye  exhibits no discharge. No scleral icterus.  Neck: Normal range of motion. Neck supple.  Cardiovascular: Normal rate, regular rhythm, systolic murmur noted left intercostal space and intact distal pulses.  Pulmonary/Chest: Effort normal and breath sounds normal. No respiratory distress. No wheezes. No rales.  Abdominal: Soft. Bowel sounds are normal. Exhibits no distension and no mass. There is no tenderness.  Musculoskeletal: Normal range of motion. Exhibits no edema.  Lymphadenopathy:  No cervical adenopathy. Neurological: Alert and oriented to person, place, and time. Exhibits muscle wasting. Examined in the wheelchair. Skin: Skin is warm and dry. No rash noted. Not diaphoretic. No erythema. No pallor.  Psychiatric: Mood, memory and  judgment normal.  Vitals reviewed.  LABORATORY DATA: Lab Results  Component Value Date   WBC 5.4 11/24/2020   HGB 10.1 (L) 11/24/2020   HCT 31.7 (L) 11/24/2020   MCV 98.1 11/24/2020   PLT 255 11/24/2020      Chemistry      Component Value Date/Time   NA 137 11/24/2020 1110   K 3.7 11/24/2020 1110   CL 104 11/24/2020 1110   CO2 27 11/24/2020 1110   BUN 10 11/24/2020 1110   CREATININE 1.06 11/24/2020 1110      Component Value Date/Time   CALCIUM 8.9 11/24/2020 1110   ALKPHOS 124 11/24/2020 1110   AST 29 11/24/2020 1110   ALT 16 11/24/2020 1110   BILITOT 0.3 11/24/2020 1110       RADIOGRAPHIC STUDIES:  No results found.   ASSESSMENT/PLAN:  This is a very pleasant 80 year old male recently diagnosed with stage IV (T4,N3,M1C) non-small cell lung cancer, adenocarcinoma. He presented with a left upper lobe lung mass in addition to bilateral pulmonary nodules as well as bilateral hilar and mediastinal lymphadenopathy. He also has metastatic disease to the bones and brain. He was diagnosed in July 2021. His molecular studies performed by [employer] was negative for any actionable mutations. The initial tissue biopsy was insufficient for molecular studies by [facility]. The patient had repeat biopsy for the molecular studieswhich were negative for any actionable mutations.   He completed SRS to the brain lesion under the care of Dr. Isidore Moos. This was completed on 05/04/20.  [name] currently undergoing palliative systemic chemotherapy with carboplatin for an AUC of 5, Alimta 500 mg per metered squared, Keytruda 200 mg IV every 3 weeks. [name] status post11cycles. Starting from cycle #5, the patient has been on maintenance Keytruda and Alimta.   Labs were reivewed. Recommend that he proceed with cycle #12 today as scheduled.   I will arrange for a restaging CT scan of the chest, abdomen, and pelvis prior to starting his next cycle of treatment.   We will see him back  for a follow up visit in 3 weeks for evaluation and to review his scan results before starting cycle #13.   Regarding needing dental work, there are no particular restrictions on our end related to his cancer treatment. We would recommend having dental work ~1 week before he is due for chemotherapy though.   The patient was advised to call immediately if he has any concerning symptoms in the interval. The patient voices understanding of current disease status and treatment options and is in agreement with the current care plan. All questions were answered. The patient knows to call the clinic with any problems, questions or concerns. We can certainly see the patient much sooner if necessary   Orders Placed This Encounter  Procedures  . CT Chest W  Contrast    Standing Status:   Future    Standing Expiration Date:   11/24/2021  . CT Abdomen Pelvis W Contrast    Standing Status:   Future    Standing Expiration Date:   11/24/2021     I spent 20-29 minutes in this encounter.   Christy Friede L Rally Ouch, PA-C 11/24/20

## 2020-11-24 ENCOUNTER — Inpatient Hospital Stay: Payer: Medicare PPO

## 2020-11-24 ENCOUNTER — Inpatient Hospital Stay: Payer: Medicare PPO | Attending: Internal Medicine | Admitting: Physician Assistant

## 2020-11-24 ENCOUNTER — Other Ambulatory Visit: Payer: Self-pay

## 2020-11-24 ENCOUNTER — Encounter: Payer: Self-pay | Admitting: Physician Assistant

## 2020-11-24 VITALS — BP 120/53 | HR 81 | Temp 97.7°F | Resp 16 | Ht 69.0 in | Wt 144.1 lb

## 2020-11-24 DIAGNOSIS — R5383 Other fatigue: Secondary | ICD-10-CM | POA: Insufficient documentation

## 2020-11-24 DIAGNOSIS — Z79899 Other long term (current) drug therapy: Secondary | ICD-10-CM | POA: Insufficient documentation

## 2020-11-24 DIAGNOSIS — Z5112 Encounter for antineoplastic immunotherapy: Secondary | ICD-10-CM | POA: Diagnosis not present

## 2020-11-24 DIAGNOSIS — C3490 Malignant neoplasm of unspecified part of unspecified bronchus or lung: Secondary | ICD-10-CM | POA: Insufficient documentation

## 2020-11-24 DIAGNOSIS — C7951 Secondary malignant neoplasm of bone: Secondary | ICD-10-CM | POA: Insufficient documentation

## 2020-11-24 DIAGNOSIS — C3492 Malignant neoplasm of unspecified part of left bronchus or lung: Secondary | ICD-10-CM

## 2020-11-24 DIAGNOSIS — Z87891 Personal history of nicotine dependence: Secondary | ICD-10-CM | POA: Insufficient documentation

## 2020-11-24 DIAGNOSIS — C7931 Secondary malignant neoplasm of brain: Secondary | ICD-10-CM | POA: Insufficient documentation

## 2020-11-24 DIAGNOSIS — Z5111 Encounter for antineoplastic chemotherapy: Secondary | ICD-10-CM | POA: Insufficient documentation

## 2020-11-24 LAB — CMP (CANCER CENTER ONLY)
ALT: 16 U/L (ref 0–44)
AST: 29 U/L (ref 15–41)
Albumin: 2.9 g/dL — ABNORMAL LOW (ref 3.5–5.0)
Alkaline Phosphatase: 124 U/L (ref 38–126)
Anion gap: 6 (ref 5–15)
BUN: 10 mg/dL (ref 8–23)
CO2: 27 mmol/L (ref 22–32)
Calcium: 8.9 mg/dL (ref 8.9–10.3)
Chloride: 104 mmol/L (ref 98–111)
Creatinine: 1.06 mg/dL (ref 0.61–1.24)
GFR, Estimated: >60 mL/min (ref >60)
Glucose, Bld: 136 mg/dL — ABNORMAL HIGH (ref 70–99)
Potassium: 3.7 mmol/L (ref 3.5–5.1)
Sodium: 137 mmol/L (ref 135–145)
Total Bilirubin: 0.3 mg/dL (ref 0.3–1.2)
Total Protein: 7 g/dL (ref 6.5–8.1)

## 2020-11-24 LAB — CBC WITH DIFFERENTIAL (CANCER CENTER ONLY)
Abs Immature Granulocytes: 0.03 10*3/uL (ref 0.00–0.07)
Basophils Absolute: 0 10*3/uL (ref 0.0–0.1)
Basophils Relative: 1 %
Eosinophils Absolute: 0.2 10*3/uL (ref 0.0–0.5)
Eosinophils Relative: 3 %
HCT: 31.7 % — ABNORMAL LOW (ref 39.0–52.0)
Hemoglobin: 10.1 g/dL — ABNORMAL LOW (ref 13.0–17.0)
Immature Granulocytes: 1 %
Lymphocytes Relative: 22 %
Lymphs Abs: 1.2 10*3/uL (ref 0.7–4.0)
MCH: 31.3 pg (ref 26.0–34.0)
MCHC: 31.9 g/dL (ref 30.0–36.0)
MCV: 98.1 fL (ref 80.0–100.0)
Monocytes Absolute: 0.7 10*3/uL (ref 0.1–1.0)
Monocytes Relative: 13 %
Neutro Abs: 3.3 10*3/uL (ref 1.7–7.7)
Neutrophils Relative %: 60 %
Platelet Count: 255 10*3/uL (ref 150–400)
RBC: 3.23 MIL/uL — ABNORMAL LOW (ref 4.22–5.81)
RDW: 16.1 % — ABNORMAL HIGH (ref 11.5–15.5)
WBC Count: 5.4 10*3/uL (ref 4.0–10.5)
nRBC: 0 % (ref 0.0–0.2)

## 2020-11-24 LAB — TSH: TSH: 2.427 u[IU]/mL (ref 0.320–4.118)

## 2020-11-24 MED ORDER — CYANOCOBALAMIN 1000 MCG/ML IJ SOLN
INTRAMUSCULAR | Status: AC
  Filled 2020-11-24: qty 1

## 2020-11-24 MED ORDER — SODIUM CHLORIDE 0.9 % IV SOLN
Freq: Once | INTRAVENOUS | Status: AC
  Filled 2020-11-24: qty 250

## 2020-11-24 MED ORDER — PROCHLORPERAZINE MALEATE 10 MG PO TABS
ORAL_TABLET | ORAL | Status: AC
  Filled 2020-11-24: qty 1

## 2020-11-24 MED ORDER — SODIUM CHLORIDE 0.9 % IV SOLN
200.0000 mg | Freq: Once | INTRAVENOUS | Status: AC
  Administered 2020-11-24: 200 mg via INTRAVENOUS
  Filled 2020-11-24: qty 8

## 2020-11-24 MED ORDER — SODIUM CHLORIDE 0.9 % IV SOLN
500.0000 mg/m2 | Freq: Once | INTRAVENOUS | Status: AC
  Administered 2020-11-24: 900 mg via INTRAVENOUS
  Filled 2020-11-24: qty 16

## 2020-11-24 MED ORDER — CYANOCOBALAMIN 1000 MCG/ML IJ SOLN
1000.0000 ug | Freq: Once | INTRAMUSCULAR | Status: AC
  Administered 2020-11-24: 1000 ug via INTRAMUSCULAR

## 2020-11-24 MED ORDER — PROCHLORPERAZINE MALEATE 10 MG PO TABS
10.0000 mg | ORAL_TABLET | Freq: Once | ORAL | Status: AC
  Administered 2020-11-24: 10 mg via ORAL

## 2020-11-24 NOTE — Patient Instructions (Signed)
[facility] Discharge Instructions for Patients Receiving Chemotherapy  Today you received the following chemotherapy agents: pembrolizumab and pemetrexed.  To help prevent nausea and vomiting after your treatment, we encourage you to take your nausea medication as directed.   If you develop nausea and vomiting that is not controlled by your nausea medication, call the clinic.   BELOW ARE SYMPTOMS THAT SHOULD BE REPORTED IMMEDIATELY:  *FEVER GREATER THAN 100.5 F  *CHILLS WITH OR WITHOUT FEVER  NAUSEA AND VOMITING THAT IS NOT CONTROLLED WITH YOUR NAUSEA MEDICATION  *UNUSUAL SHORTNESS OF BREATH  *UNUSUAL BRUISING OR BLEEDING  TENDERNESS IN MOUTH AND THROAT WITH OR WITHOUT PRESENCE OF ULCERS  *URINARY PROBLEMS  *BOWEL PROBLEMS  UNUSUAL RASH Items with * indicate a potential emergency and should be followed up as soon as possible.  Feel free to call the clinic should you have any questions or concerns. The clinic phone number is [phone].  Please show the [facility] at check-in to the Emergency Department and triage nurse.

## 2020-11-25 ENCOUNTER — Telehealth: Payer: Self-pay | Admitting: Physician Assistant

## 2020-11-25 NOTE — Telephone Encounter (Signed)
Scheduled per los. Called and left msg. Mailed printout  °

## 2020-11-27 ENCOUNTER — Other Ambulatory Visit: Payer: Self-pay

## 2020-11-27 ENCOUNTER — Ambulatory Visit (HOSPITAL_COMMUNITY)
Admission: RE | Admit: 2020-11-27 | Discharge: 2020-11-27 | Disposition: A | Discharge status: Home / Self Care | Payer: Medicare PPO | Source: Ambulatory Visit | Attending: Radiation Oncology | Admitting: Radiation Oncology

## 2020-11-27 DIAGNOSIS — C7949 Secondary malignant neoplasm of other parts of nervous system: Secondary | ICD-10-CM | POA: Insufficient documentation

## 2020-11-27 DIAGNOSIS — G9389 Other specified disorders of brain: Secondary | ICD-10-CM | POA: Diagnosis not present

## 2020-11-27 DIAGNOSIS — C7931 Secondary malignant neoplasm of brain: Secondary | ICD-10-CM | POA: Insufficient documentation

## 2020-11-27 DIAGNOSIS — C3490 Malignant neoplasm of unspecified part of unspecified bronchus or lung: Secondary | ICD-10-CM | POA: Diagnosis not present

## 2020-11-27 DIAGNOSIS — I614 Nontraumatic intracerebral hemorrhage in cerebellum: Secondary | ICD-10-CM | POA: Diagnosis not present

## 2020-11-27 IMAGING — MR MR HEAD WO/W CM
11 of 14 series · 23 of 48 positions shown · IV contrast (6.5 M gad)
Comparison: [DATE]

CLINICAL DATA: Metastatic lung carcinoma status post stereotactic
radio surgery

EXAM:
MRI HEAD WITHOUT AND WITH CONTRAST
TECHNIQUE: Multiplanar, multiecho pulse sequences of the brain and surrounding
structures were obtained without and with intravenous contrast.
CONTRAST:  6.5mL GADAVIST GADOBUTROL 1 MMOL/ML IV SOLN

[Series 2: FLAIR · sagittal · 3.0mm · 0.47mm/px · 2 of 47 slices shown (1 of 2)]
[im 1/47]
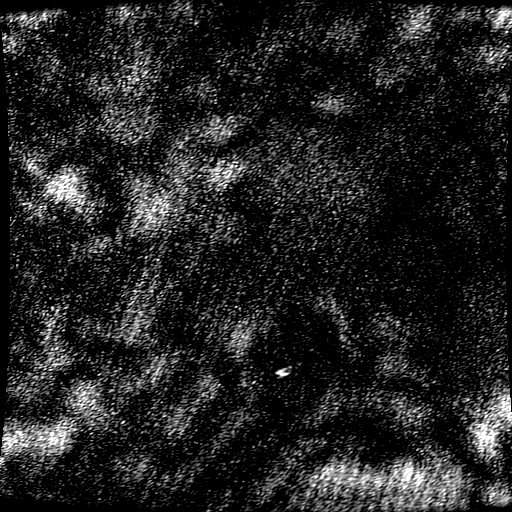
[im 47/47]
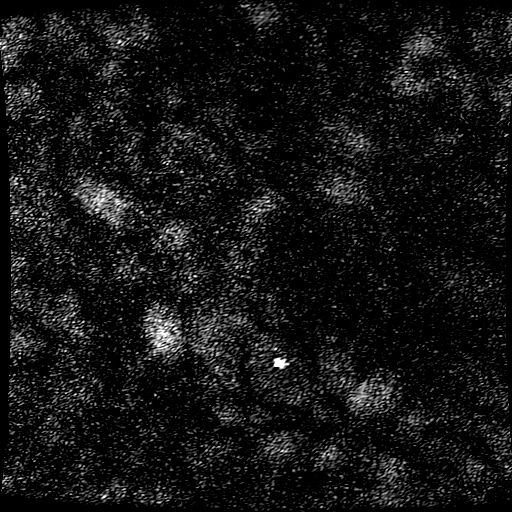

[Series 3: DWI · axial · 3.0mm · 0.94mm/px · z∈[-75,+111]mm · 3 of 126 slices shown]
[im 1/126]
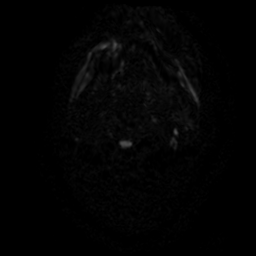
[im 63/126]
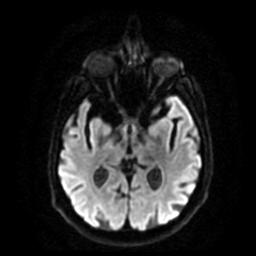
[im 126/126]
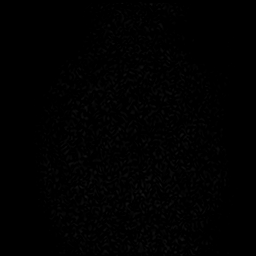

[Series 4: FLAIR · axial · 3.0mm · 0.47mm/px · z∈[-69,+120]mm · 2 of 64 slices shown (2 of 2)]
[im 1/64]
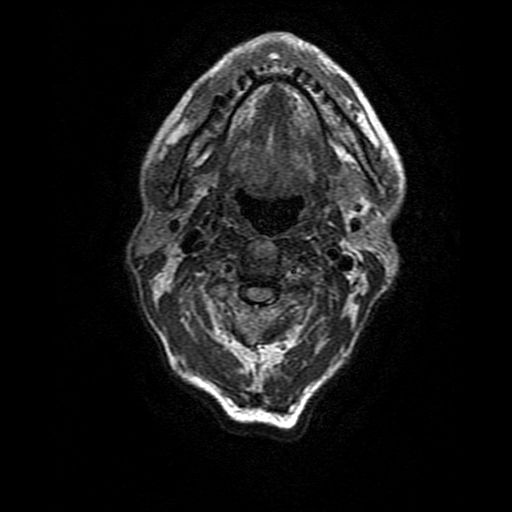
[im 64/64]
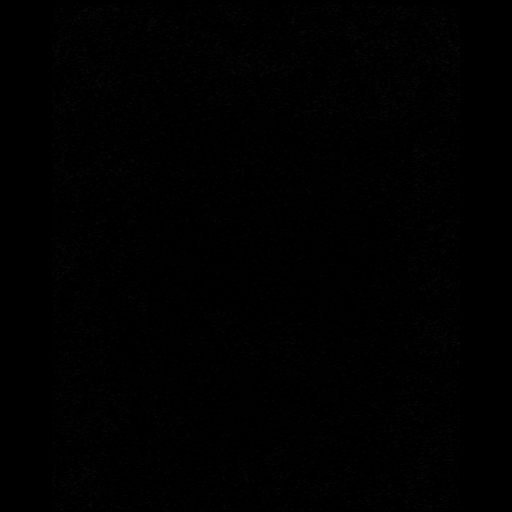

[Series 5: SWI · axial · 3.0mm · 0.47mm/px · z∈[-70,+115]mm · 3 of 124 slices shown (1 of 2)]
[im 1/124]
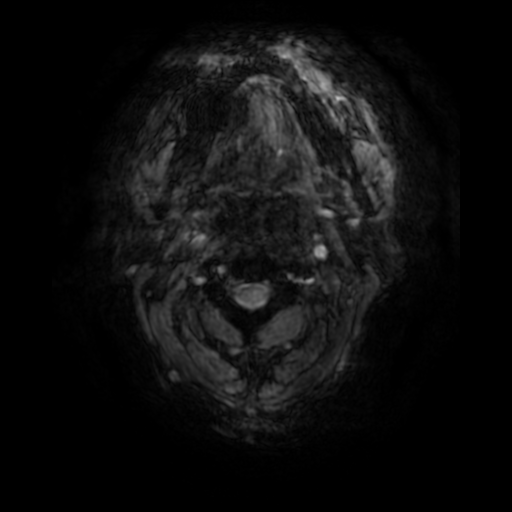
[im 62/124]
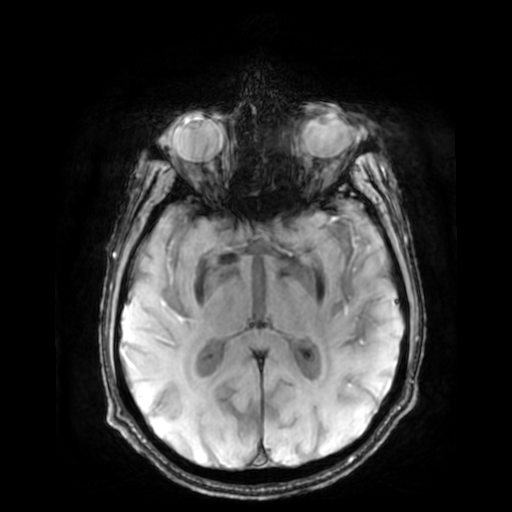
[im 124/124]
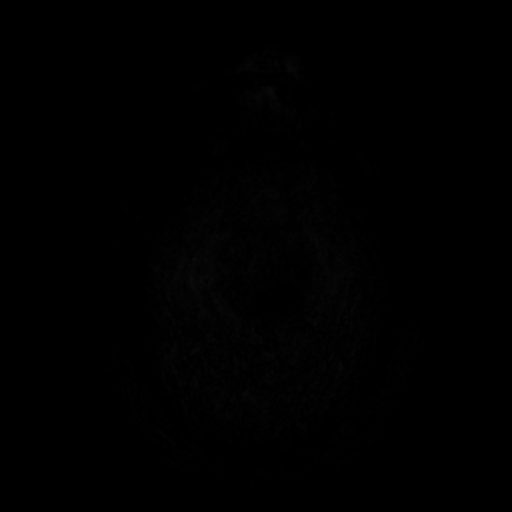

[Series 8: T2 post-contrast · coronal · 3.0mm · 0.39mm/px · 1 of 55 slices shown (1 of 2)]
[im 1/55]
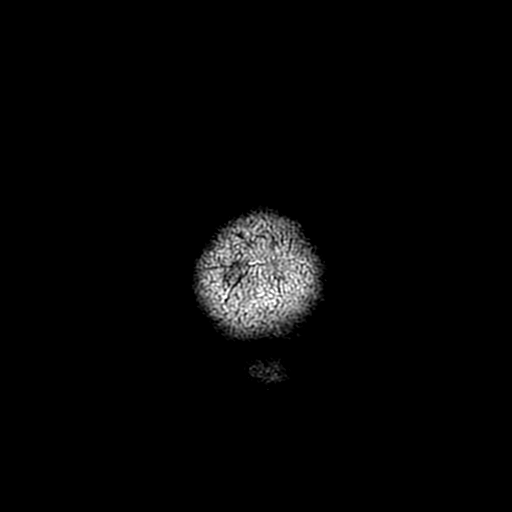

[Series 9: T2 post-contrast · axial · 5.0mm · 0.47mm/px · 1 of 34 slices shown (2 of 2)]
[im 1/34]
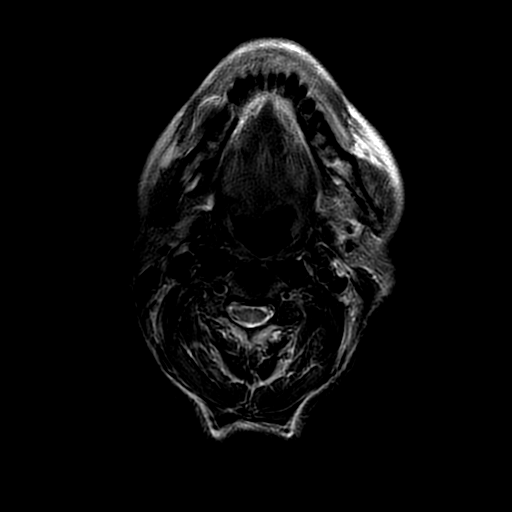

[Series 10: T1 post-contrast · coronal · 3.0mm · 0.39mm/px · 1 of 55 slices shown]
[im 1/55]
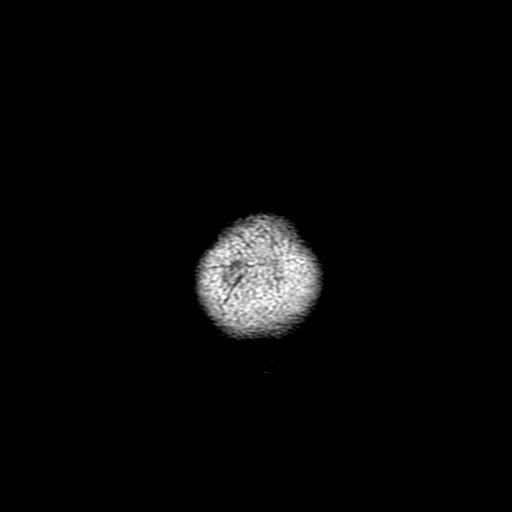

[Series 11: FLAIR post-contrast · sagittal · 3.0mm · 0.47mm/px · 1 of 47 slices shown]
[im 1/47]
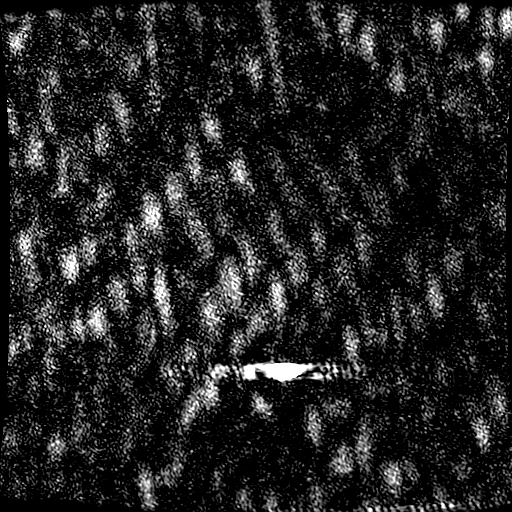

[Series 350: ADC · axial · 3.0mm · 0.94mm/px · z∈[-75,+111]mm · 2 of 63 slices shown]
[im 1/63]
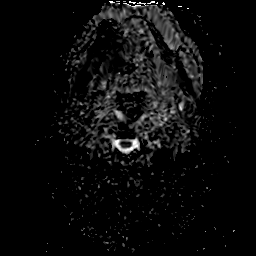
[im 63/63]
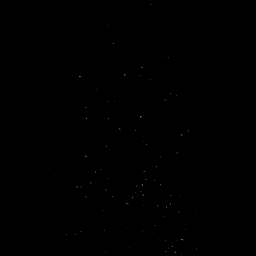

[Series 500: SWI · axial · 3.0mm · 0.47mm/px · z∈[-70,+107]mm · 3 of 119 slices shown (2 of 2)]
[im 1/119]
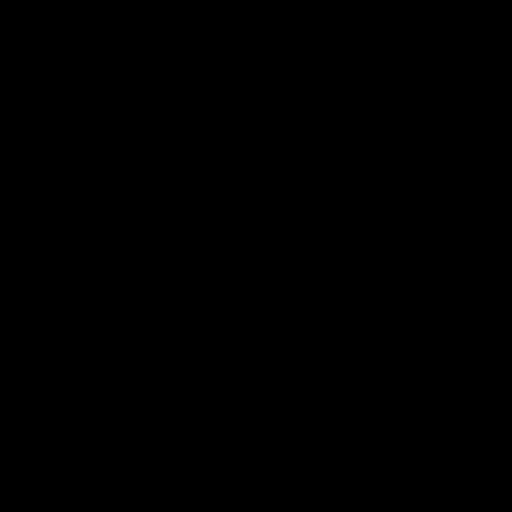
[im 60/119]
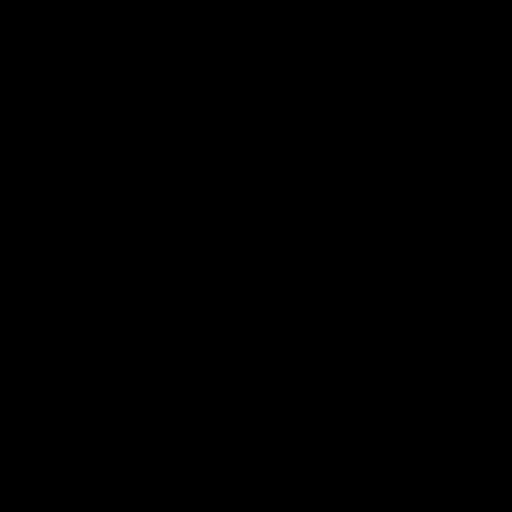
[im 119/119]
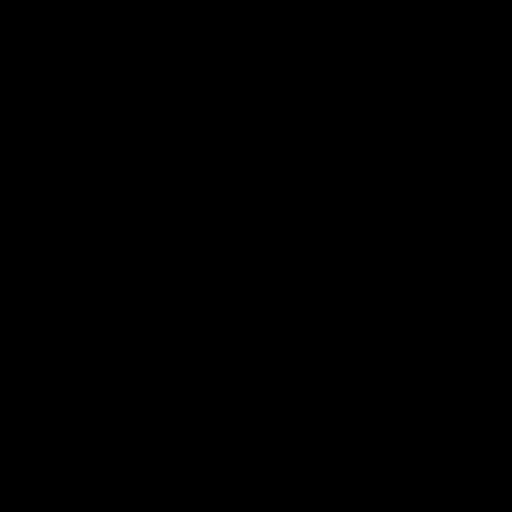

[Series 600: T1 · axial · non-contrast · 0.9mm · 0.50mm/px · z∈[-130,-20]mm · 4 of 300 slices shown]
[im 1/300]
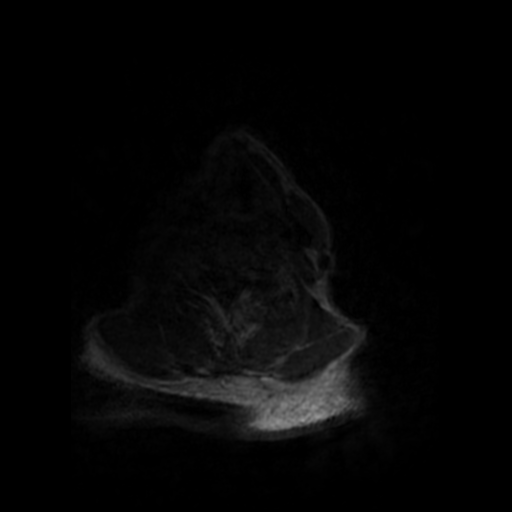
[im 43/300]
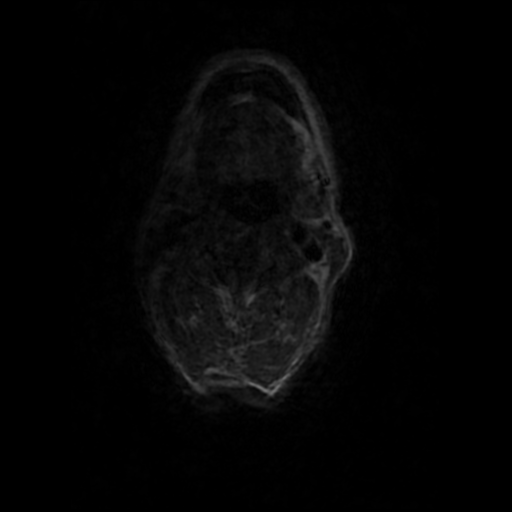
[im 86/300]
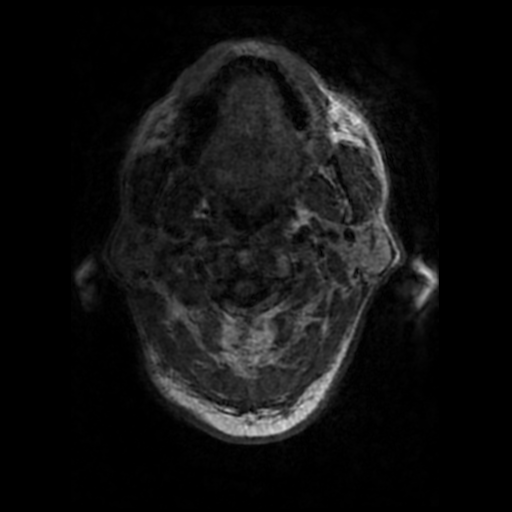
[im 129/300]
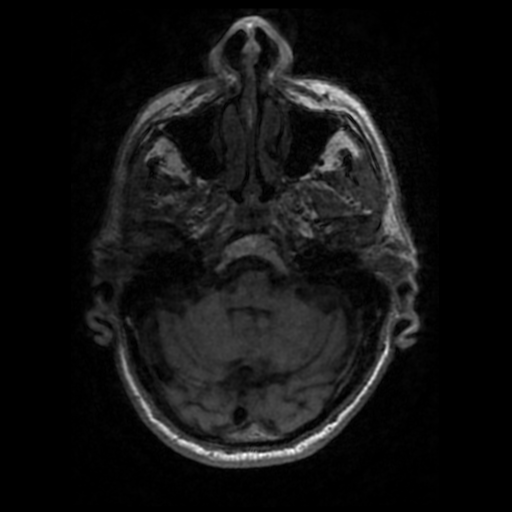

[23 of 48 positions shown; findings below may reference images not displayed]

FINDINGS: Brain: Minimal periventricular white matter T2-weighted signal
hyperintensity is unchanged. Unchanged focus of microhemorrhage in
the left cerebellar hemisphere. No acute infarct. Generalized volume
loss is unchanged.

There are 3 punctate lesions in the cerebellum there are all
unchanged in size. The largest is in the posterior left hemisphere
and measures 5 mm. These lesions are annotated on series 601.

Right frontal meningioma is unchanged in size. A smaller extra-axial
lesion over the left convexity measuring 3 mm is also unchanged

Vascular: Normal flow voids.

Skull and upper cervical spine: Normal marrow signal.

Sinuses/Orbits: Negative.

Other: None.
IMPRESSION: 1. Unchanged size of 3 punctate metastases in the cerebellum. No new
lesions.
2. Unchanged right frontal and left convexity extra-axial lesions,
likely meningiomas.

## 2020-11-27 MED ORDER — GADOBUTROL 1 MMOL/ML IV SOLN
6.5000 mL | Freq: Once | INTRAVENOUS | Status: AC | PRN
  Administered 2020-11-27: 6.5 mL via INTRAVENOUS

## 2020-11-30 ENCOUNTER — Inpatient Hospital Stay: Payer: Medicare PPO | Admitting: Internal Medicine

## 2020-11-30 ENCOUNTER — Encounter: Payer: Self-pay | Admitting: Internal Medicine

## 2020-11-30 ENCOUNTER — Other Ambulatory Visit: Payer: Self-pay

## 2020-11-30 VITALS — BP 117/53 | HR 86 | Temp 98.1°F | Resp 17 | Ht 69.0 in | Wt 142.2 lb

## 2020-11-30 DIAGNOSIS — R5383 Other fatigue: Secondary | ICD-10-CM | POA: Diagnosis not present

## 2020-11-30 DIAGNOSIS — Z5112 Encounter for antineoplastic immunotherapy: Secondary | ICD-10-CM | POA: Diagnosis not present

## 2020-11-30 DIAGNOSIS — Z79899 Other long term (current) drug therapy: Secondary | ICD-10-CM | POA: Diagnosis not present

## 2020-11-30 DIAGNOSIS — C7931 Secondary malignant neoplasm of brain: Secondary | ICD-10-CM | POA: Diagnosis not present

## 2020-11-30 DIAGNOSIS — C3490 Malignant neoplasm of unspecified part of unspecified bronchus or lung: Secondary | ICD-10-CM | POA: Diagnosis not present

## 2020-11-30 DIAGNOSIS — C7951 Secondary malignant neoplasm of bone: Secondary | ICD-10-CM | POA: Diagnosis not present

## 2020-11-30 DIAGNOSIS — Z87891 Personal history of nicotine dependence: Secondary | ICD-10-CM | POA: Diagnosis not present

## 2020-11-30 DIAGNOSIS — Z5111 Encounter for antineoplastic chemotherapy: Secondary | ICD-10-CM | POA: Diagnosis not present

## 2020-11-30 NOTE — Progress Notes (Signed)
[facility] [place], [place] [address] [phone]   New Patient Evaluation  Date of Service: 11/30/20 Patient Name: Phillip Franklin Patient MRN: [id number] Patient DOB: [birth year] Provider: Ventura Sellers, MD  Identifying Statement:  Phillip Franklin is a 80 y.o. male with Brain metastases ([facility]) [C79.31] who presents for initial consultation and evaluation regarding cancer associated neurologic deficits.    Referring Provider: Kyung Rudd, MD [address] [place],  [place] [address]  Primary Cancer:  Oncologic History: Oncology History  Adenocarcinoma of left lung, stage 4 ([facility])  03/30/2020 Initial Diagnosis   Adenocarcinoma of left lung, stage 4 ([facility])   04/06/2020 -  Chemotherapy    Patient is on Treatment Plan: LUNG CARBOPLATIN / PEMETREXED / PEMBROLIZUMAB Q21D INDUCTION X 4 CYCLES / MAINTENANCE PEMETREXED + PEMBROLIZUMAB      10/13/2020 Cancer Staging   Staging form: Lung, AJCC 8th Edition - Clinical: Stage IVB (cT4, cN3, cM1c) - Signed by Curt Bears, MD on 10/13/2020    [facility] Oncologic History 05/04/20: SRS to 5 posterior fossa metastases Phillip Franklin)  History of Present Illness: The patient's records from the referring physician were obtained and reviewed and the patient interviewed to confirm this HPI.  Phillip Franklin presents to clinic today following recent brain MRI for review.  He and his wife describe no new or progressive neurologic deficits.  He continues to maintain independence, although fatigue, balance issues, cognitive "slowing" impede activities such as driving, going on long outings.  He continues on chemotherapy with Dr. Julien Nordmann.  Denies headaches or seizures.  Medications: Current Outpatient Medications on File Prior to Visit  Medication Sig Dispense Refill  . acetaminophen (TYLENOL) 325 MG tablet Take 325-650 mg by mouth every 6 (six) hours as needed for moderate pain or headache.    Marland Kitchen atorvastatin  (LIPITOR) 20 MG tablet Take 20 mg by mouth daily.    . benzonatate (TESSALON) 200 MG capsule Take 1 capsule (200 mg total) by mouth 4 (four) times daily as needed for cough. 60 capsule 1  . chlorhexidine (PERIDEX) 0.12 % solution RINSE WITH 15ML THREE TIMES DAILY FOR MOUTH RINSE FOR 1 MINUTE THEN EXPECTORATE    . citalopram (CELEXA) 20 MG tablet Take 1 tablet (20 mg total) by mouth daily. 30 tablet 3  . dronabinol (MARINOL) 2.5 MG capsule TAKE 1 CAPSULE BY MOUTH TWICE DAILY BEFORE MEAL(S) 60 capsule 0  . esomeprazole (NEXIUM) 20 MG capsule Take 20 mg by mouth daily as needed (acid reflux).     . folic acid (FOLVITE) 1 MG tablet Take 1 tablet by mouth once daily 30 tablet 0  . HYDROmorphone (DILAUDID) 2 MG tablet Take 2 mg by mouth 4 (four) times daily.    Marland Kitchen levothyroxine (SYNTHROID) 88 MCG tablet Take 88 mcg by mouth daily before breakfast.    . LORazepam (ATIVAN) 0.5 MG tablet Take 1-2 tablets by mouth 30 minutes before procedures, PRN anxiety. 8 tablet 0  . Multiple Vitamins-Minerals (CENTRUM SILVER 50+MEN) TABS Take 1 tablet by mouth daily.    Marland Kitchen neomycin-polymyxin b-dexamethasone (MAXITROL) 3.5-10000-0.1 SUSP Place 1 drop into the right eye every 8 (eight) hours.    . potassium chloride SA (KLOR-CON) 20 MEQ tablet Take 1 tablet (20 mEq total) by mouth daily. 6 tablet 0  . prochlorperazine (COMPAZINE) 10 MG tablet Take 1 tablet (10 mg total) by mouth every 6 (six) hours as needed for nausea or vomiting. 30 tablet 0  .  sulfamethoxazole-trimethoprim (BACTRIM DS) 800-160 MG tablet Take 1 tablet by mouth 2 (two) times daily. 14 tablet 0  . tamsulosin (FLOMAX) 0.4 MG CAPS capsule Take 0.4 mg by mouth every evening.     . traMADol (ULTRAM) 50 MG tablet Take 1 tablet (50 mg total) by mouth every 6 (six) hours as needed. 30 tablet 0   No current facility-administered medications on file prior to visit.    Allergies:  Allergies  Allergen Reactions  . Codeine Other (See Comments)    hallucinations   . Hydrocodone Other (See Comments)    hallucinations  . Oxycodone Other (See Comments)    hallucinations  . Penicillins Nausea And Vomiting   Past Medical History:  Past Medical History:  Diagnosis Date  . Depression   . GERD (gastroesophageal reflux disease)    occasional, diet controlled  . High cholesterol   . Hypertension    no longer taking medication - states it's undercontrol  . Hypothyroid    Past Surgical History:  Past Surgical History:  Procedure Laterality Date  . BRONCHIAL NEEDLE ASPIRATION BIOPSY  03/24/2020   Procedure: BRONCHIAL NEEDLE ASPIRATION BIOPSIES;  Surgeon: Candee Furbish, MD;  Location: [facility] ENDOSCOPY;  Service: Pulmonary;;  . HEMORRHOID SURGERY    . VIDEO BRONCHOSCOPY WITH ENDOBRONCHIAL ULTRASOUND N/A 03/24/2020   Procedure: VIDEO BRONCHOSCOPY WITH ENDOBRONCHIAL ULTRASOUND;  Surgeon: Candee Furbish, MD;  Location: [facility] ENDOSCOPY;  Service: Pulmonary;  Laterality: N/A;   Social History:  Social History   Socioeconomic History  . Marital status: Married    Spouse name: Not on file  . Number of children: Not on file  . Years of education: Not on file  . Highest education level: Not on file  Occupational History  . Not on file  Tobacco Use  . Smoking status: Former Smoker    Types: Cigarettes, Pipe    Quit date: 11/13/1977    Years since quitting: 43.0  . Smokeless tobacco: Never Used  Vaping Use  . Vaping Use: Never used  Substance and Sexual Activity  . Alcohol use: No  . Drug use: No  . Sexual activity: Not Currently  Other Topics Concern  . Not on file  Social History Narrative  . Not on file   Social Determinants of Health   Financial Resource Strain: Not on file  Food Insecurity: Not on file  Transportation Needs: Not on file  Physical Activity: Not on file  Stress: Not on file  Social Connections: Not on file  Intimate Partner Violence: Not on file   Family History:  Family History  Problem Relation Age of Onset  . Heart  disease Father     Review of Systems: Constitutional: Doesn't report fevers, chills or abnormal weight loss Eyes: Doesn't report blurriness of vision Ears, nose, mouth, throat, and face: Doesn't report sore throat Respiratory: Doesn't report cough, dyspnea or wheezes Cardiovascular: Doesn't report palpitation, chest discomfort  Gastrointestinal:  Doesn't report nausea, constipation, diarrhea GU: Doesn't report incontinence Skin: Doesn't report skin rashes Neurological: Per HPI Musculoskeletal: Doesn't report joint pain Behavioral/Psych: Doesn't report anxiety  Physical Exam: Vitals:   11/30/20 1214  BP: (!) 117/53  Pulse: 86  Resp: 17  Temp: 98.1 F (36.7 C)  SpO2: 99%   KPS: 80. General: Alert, cooperative, pleasant, in no acute distress Head: Normal EENT: No conjunctival injection or scleral icterus.  Lungs: Resp effort normal Cardiac: Regular rate Abdomen: Non-distended abdomen Skin: No rashes cyanosis or petechiae. Extremities: No clubbing or edema  Neurologic Exam: Mental Status: Awake, alert, attentive to examiner. Oriented to self and environment. Language is fluent with intact comprehension.  Slow processing speed. Cranial Nerves: Visual acuity is grossly normal. Visual fields are full. Extra-ocular movements intact. No ptosis. Face is symmetric Motor: Tone and bulk are normal. Power is full in both arms and legs. Reflexes are symmetric, no pathologic reflexes present.  Sensory: Intact to light touch Gait: Impaired tandem   Labs: I have reviewed the data as listed    Component Value Date/Time   NA 137 11/24/2020 1110   K 3.7 11/24/2020 1110   CL 104 11/24/2020 1110   CO2 27 11/24/2020 1110   GLUCOSE 136 (H) 11/24/2020 1110   BUN 10 11/24/2020 1110   CREATININE 1.06 11/24/2020 1110   CALCIUM 8.9 11/24/2020 1110   PROT 7.0 11/24/2020 1110   ALBUMIN 2.9 (L) 11/24/2020 1110   AST 29 11/24/2020 1110   ALT 16 11/24/2020 1110   ALKPHOS 124 11/24/2020 1110    BILITOT 0.3 11/24/2020 1110   GFRNONAA >60 11/24/2020 1110   GFRAA >60 06/22/2020 1236   Lab Results  Component Value Date   WBC 5.4 11/24/2020   NEUTROABS 3.3 11/24/2020   HGB 10.1 (L) 11/24/2020   HCT 31.7 (L) 11/24/2020   MCV 98.1 11/24/2020   PLT 255 11/24/2020    Imaging:  MR Brain W Wo Contrast  Result Date: 11/27/2020 CLINICAL DATA:  Metastatic lung carcinoma status post stereotactic radio surgery EXAM: MRI HEAD WITHOUT AND WITH CONTRAST TECHNIQUE: Multiplanar, multiecho pulse sequences of the brain and surrounding structures were obtained without and with intravenous contrast. CONTRAST:  6.68mL GADAVIST GADOBUTROL 1 MMOL/ML IV SOLN COMPARISON:  08/24/2020 FINDINGS: Brain: Minimal periventricular white matter T2-weighted signal hyperintensity is unchanged. Unchanged focus of microhemorrhage in the left cerebellar hemisphere. No acute infarct. Generalized volume loss is unchanged. There are 3 punctate lesions in the cerebellum there are all unchanged in size. The largest is in the posterior left hemisphere and measures 5 mm. These lesions are annotated on series 601. Right frontal meningioma is unchanged in size. A smaller extra-axial lesion over the left convexity measuring 3 mm is also unchanged (601:137). Vascular: Normal flow voids. Skull and upper cervical spine: Normal marrow signal. Sinuses/Orbits: Negative. Other: None. IMPRESSION: 1. Unchanged size of 3 punctate metastases in the cerebellum. No new lesions. 2. Unchanged right frontal and left convexity extra-axial lesions, likely meningiomas. Electronically Signed   By: Ulyses Jarred M.D.   On: 11/27/2020 23:20    [facility] Clinician Interpretation: I have personally reviewed the radiological images as listed.  My interpretation, in the context of the patient's clinical presentation, is stable disease   Assessment/Plan Brain metastases [facility]) [C79.31]  Phillip Franklin is clinically and radiographically stable today, now 7 months  removed from [facility].  He demonstrates age appropriate or age advanced cortical atrophy on MRI.  We counseled him today regarding exercise, sleep, diet, positive outlook.  We spent twenty additional minutes teaching regarding the natural history, biology, and historical experience in the treatment of neurologic complications of cancer.   We appreciate the opportunity to participate in the care of Phillip Franklin.   We ask that Phillip Franklin return to clinic in 4 months following next brain MRI, or sooner as needed.  All questions were answered. The patient knows to call the clinic with any problems, questions or concerns. No barriers to learning were detected.  The total time spent in the encounter was 40 minutes and  more than 50% was on counseling and review of test results   Ventura Sellers, MD Medical Director of Neuro-Oncology [facility] [place] 11/30/20 2:01 PM

## 2020-12-01 ENCOUNTER — Telehealth: Payer: Self-pay | Admitting: Medical Oncology

## 2020-12-01 NOTE — Telephone Encounter (Signed)
LVM of Dr Reche Dixon message pn pts phone .

## 2020-12-01 NOTE — Telephone Encounter (Signed)
Wife asking "Does Mr . Fahl need to still see Dr Christella Noa"?

## 2020-12-01 NOTE — Telephone Encounter (Signed)
We reviewed MRI results and neurologic issues yesterday.  There is no role for neurosurgery at this time, so it would be ok if they cancelled the appointment.  Ventura Sellers, MD

## 2020-12-03 ENCOUNTER — Telehealth: Payer: Self-pay | Admitting: Internal Medicine

## 2020-12-03 NOTE — Telephone Encounter (Signed)
Scheduled per los. Called and left msg. Mailed printout  °

## 2020-12-04 ENCOUNTER — Other Ambulatory Visit: Payer: Self-pay | Admitting: Radiation Therapy

## 2020-12-09 ENCOUNTER — Telehealth: Payer: Self-pay | Admitting: Medical Oncology

## 2020-12-09 NOTE — Telephone Encounter (Signed)
-----   Message from [facility] sent at 12/09/2020  1:11 PM EDT ----- Regarding: scan Good afternoon - the auth for patients scans on Friday are still pending. Due to the 48hr rule, we have to cancel and rs once we have the approval.   As soon as we do, I will let you know.  Thank you  Velna Hatchet

## 2020-12-10 ENCOUNTER — Telehealth: Payer: Self-pay | Admitting: Medical Oncology

## 2020-12-10 NOTE — Telephone Encounter (Signed)
Wife confirmed appt for 03/29.

## 2020-12-10 NOTE — Telephone Encounter (Signed)
Wife said [employer] told her this morning that the scans have not been auth because the radiology order has expired. Message sent to [facility].

## 2020-12-11 ENCOUNTER — Encounter (HOSPITAL_COMMUNITY): Payer: Self-pay

## 2020-12-11 ENCOUNTER — Ambulatory Visit (HOSPITAL_COMMUNITY): Payer: Medicare PPO

## 2020-12-15 ENCOUNTER — Inpatient Hospital Stay: Payer: Medicare PPO

## 2020-12-15 ENCOUNTER — Other Ambulatory Visit: Payer: Self-pay

## 2020-12-15 ENCOUNTER — Inpatient Hospital Stay: Payer: Medicare PPO | Admitting: Internal Medicine

## 2020-12-15 ENCOUNTER — Other Ambulatory Visit: Payer: Self-pay | Admitting: Internal Medicine

## 2020-12-15 VITALS — BP 117/55 | HR 77 | Temp 97.9°F | Resp 15 | Ht 69.0 in | Wt 144.1 lb

## 2020-12-15 DIAGNOSIS — R5383 Other fatigue: Secondary | ICD-10-CM | POA: Diagnosis not present

## 2020-12-15 DIAGNOSIS — C3492 Malignant neoplasm of unspecified part of left bronchus or lung: Secondary | ICD-10-CM

## 2020-12-15 DIAGNOSIS — Z79899 Other long term (current) drug therapy: Secondary | ICD-10-CM | POA: Diagnosis not present

## 2020-12-15 DIAGNOSIS — Z5111 Encounter for antineoplastic chemotherapy: Secondary | ICD-10-CM

## 2020-12-15 DIAGNOSIS — C7951 Secondary malignant neoplasm of bone: Secondary | ICD-10-CM | POA: Diagnosis not present

## 2020-12-15 DIAGNOSIS — Z87891 Personal history of nicotine dependence: Secondary | ICD-10-CM | POA: Diagnosis not present

## 2020-12-15 DIAGNOSIS — E039 Hypothyroidism, unspecified: Secondary | ICD-10-CM

## 2020-12-15 DIAGNOSIS — C3490 Malignant neoplasm of unspecified part of unspecified bronchus or lung: Secondary | ICD-10-CM | POA: Diagnosis not present

## 2020-12-15 DIAGNOSIS — C7931 Secondary malignant neoplasm of brain: Secondary | ICD-10-CM

## 2020-12-15 DIAGNOSIS — Z5112 Encounter for antineoplastic immunotherapy: Secondary | ICD-10-CM | POA: Diagnosis not present

## 2020-12-15 LAB — CMP (CANCER CENTER ONLY)
ALT: 13 U/L (ref 0–44)
AST: 25 U/L (ref 15–41)
Albumin: 2.9 g/dL — ABNORMAL LOW (ref 3.5–5.0)
Alkaline Phosphatase: 118 U/L (ref 38–126)
Anion gap: 13 (ref 5–15)
BUN: 10 mg/dL (ref 8–23)
CO2: 24 mmol/L (ref 22–32)
Calcium: 9.2 mg/dL (ref 8.9–10.3)
Chloride: 103 mmol/L (ref 98–111)
Creatinine: 1.18 mg/dL (ref 0.61–1.24)
GFR, Estimated: >60 mL/min (ref >60)
Glucose, Bld: 172 mg/dL — ABNORMAL HIGH (ref 70–99)
Potassium: 3.5 mmol/L (ref 3.5–5.1)
Sodium: 140 mmol/L (ref 135–145)
Total Bilirubin: 0.3 mg/dL (ref 0.3–1.2)
Total Protein: 7.3 g/dL (ref 6.5–8.1)

## 2020-12-15 LAB — CBC WITH DIFFERENTIAL (CANCER CENTER ONLY)
Abs Immature Granulocytes: 0.02 10*3/uL (ref 0.00–0.07)
Basophils Absolute: 0 10*3/uL (ref 0.0–0.1)
Basophils Relative: 0 %
Eosinophils Absolute: 0.2 10*3/uL (ref 0.0–0.5)
Eosinophils Relative: 3 %
HCT: 33.6 % — ABNORMAL LOW (ref 39.0–52.0)
Hemoglobin: 10.5 g/dL — ABNORMAL LOW (ref 13.0–17.0)
Immature Granulocytes: 0 %
Lymphocytes Relative: 20 %
Lymphs Abs: 1.1 10*3/uL (ref 0.7–4.0)
MCH: 31.2 pg (ref 26.0–34.0)
MCHC: 31.3 g/dL (ref 30.0–36.0)
MCV: 99.7 fL (ref 80.0–100.0)
Monocytes Absolute: 0.5 10*3/uL (ref 0.1–1.0)
Monocytes Relative: 9 %
Neutro Abs: 3.7 10*3/uL (ref 1.7–7.7)
Neutrophils Relative %: 68 %
Platelet Count: 251 10*3/uL (ref 150–400)
RBC: 3.37 MIL/uL — ABNORMAL LOW (ref 4.22–5.81)
RDW: 16.2 % — ABNORMAL HIGH (ref 11.5–15.5)
WBC Count: 5.5 10*3/uL (ref 4.0–10.5)
nRBC: 0 % (ref 0.0–0.2)

## 2020-12-15 LAB — TSH: TSH: 3.466 u[IU]/mL (ref 0.320–4.118)

## 2020-12-15 MED ORDER — PEMETREXED DISODIUM CHEMO INJECTION 500 MG
500.0000 mg/m2 | Freq: Once | INTRAVENOUS | Status: AC
  Administered 2020-12-15: 900 mg via INTRAVENOUS
  Filled 2020-12-15: qty 36

## 2020-12-15 MED ORDER — PROCHLORPERAZINE MALEATE 10 MG PO TABS
10.0000 mg | ORAL_TABLET | Freq: Once | ORAL | Status: AC
  Administered 2020-12-15: 10 mg via ORAL

## 2020-12-15 MED ORDER — PROCHLORPERAZINE MALEATE 10 MG PO TABS
ORAL_TABLET | ORAL | Status: AC
  Filled 2020-12-15: qty 1

## 2020-12-15 MED ORDER — SODIUM CHLORIDE 0.9 % IV SOLN
Freq: Once | INTRAVENOUS | Status: AC
  Filled 2020-12-15: qty 250

## 2020-12-15 MED ORDER — SODIUM CHLORIDE 0.9 % IV SOLN
200.0000 mg | Freq: Once | INTRAVENOUS | Status: AC
  Administered 2020-12-15: 200 mg via INTRAVENOUS
  Filled 2020-12-15: qty 8

## 2020-12-15 NOTE — Progress Notes (Signed)
[facility] [phone]   [phone]  OFFICE PROGRESS NOTE  Seward Carol, MD [address] [place] [address]  DIAGNOSIS: Stage IV (T4, N3, M1c) non-small cell lung cancer, adenocarcinoma presented with left upper lobe lung mass in addition to bilateral mediastinal lymphadenopathy as well as bilateral pulmonary nodules and metastatic disease to the bones and brain diagnosed in July 2021.  Molecular studies by [employer] showed no actionable mutations.  PRIOR THERAPY: None  CURRENT THERAPY: Systemic chemotherapy with carboplatin for AUC of 5, Alimta 500 mg/M2 and Keytruda 200 mg IV every 3 weeks.  First dose 04/06/2020.  Status post 12 cycles.  Starting from cycle #5 he is on maintenance treatment with Alimta and Keytruda every 3 weeks.  INTERVAL HISTORY: SHARON STAPEL 80 y.o. male returns to the clinic today for follow-up visit accompanied by his wife.  The patient continues to complain of fatigue.  He denied having any current chest pain, shortness of breath except with exertion with no cough or hemoptysis.  He has no nausea, vomiting, diarrhea or constipation.  He has no headache or visual changes.  He has been tolerating his treatment with Alimta and Keytruda fairly well except for the fatigue.  [name] here today for evaluation before starting cycle #13.  MEDICAL HISTORY: Past Medical History:  Diagnosis Date  . Depression   . GERD (gastroesophageal reflux disease)    occasional, diet controlled  . High cholesterol   . Hypertension    no longer taking medication - states it's undercontrol  . Hypothyroid     ALLERGIES:  is allergic to codeine, hydrocodone, oxycodone, and penicillins.  MEDICATIONS:  Current Outpatient Medications  Medication Sig Dispense Refill  . acetaminophen (TYLENOL) 325 MG tablet Take 325-650 mg by mouth every 6 (six) hours as needed for moderate pain or headache.    Marland Kitchen atorvastatin (LIPITOR) 20  MG tablet Take 20 mg by mouth daily.    . benzonatate (TESSALON) 200 MG capsule Take 1 capsule (200 mg total) by mouth 4 (four) times daily as needed for cough. 60 capsule 1  . chlorhexidine (PERIDEX) 0.12 % solution RINSE WITH 15ML THREE TIMES DAILY FOR MOUTH RINSE FOR 1 MINUTE THEN EXPECTORATE    . citalopram (CELEXA) 20 MG tablet Take 1 tablet (20 mg total) by mouth daily. 30 tablet 3  . dronabinol (MARINOL) 2.5 MG capsule TAKE 1 CAPSULE BY MOUTH TWICE DAILY BEFORE MEAL(S) 60 capsule 0  . esomeprazole (NEXIUM) 20 MG capsule Take 20 mg by mouth daily as needed (acid reflux).     . folic acid (FOLVITE) 1 MG tablet Take 1 tablet by mouth once daily 30 tablet 0  . HYDROmorphone (DILAUDID) 2 MG tablet Take 2 mg by mouth 4 (four) times daily.    Marland Kitchen levothyroxine (SYNTHROID) 88 MCG tablet Take 88 mcg by mouth daily before breakfast.    . LORazepam (ATIVAN) 0.5 MG tablet Take 1-2 tablets by mouth 30 minutes before procedures, PRN anxiety. 8 tablet 0  . Multiple Vitamins-Minerals (CENTRUM SILVER 50+MEN) TABS Take 1 tablet by mouth daily.    Marland Kitchen neomycin-polymyxin b-dexamethasone (MAXITROL) 3.5-10000-0.1 SUSP Place 1 drop into the right eye every 8 (eight) hours.    . potassium chloride SA (KLOR-CON) 20 MEQ tablet Take 1 tablet (20 mEq total) by mouth daily. 6 tablet 0  . prochlorperazine (COMPAZINE) 10 MG tablet Take 1 tablet (10 mg total) by mouth every 6 (six) hours as  needed for nausea or vomiting. 30 tablet 0  . sulfamethoxazole-trimethoprim (BACTRIM DS) 800-160 MG tablet Take 1 tablet by mouth 2 (two) times daily. 14 tablet 0  . tamsulosin (FLOMAX) 0.4 MG CAPS capsule Take 0.4 mg by mouth every evening.     . traMADol (ULTRAM) 50 MG tablet Take 1 tablet (50 mg total) by mouth every 6 (six) hours as needed. 30 tablet 0   No current facility-administered medications for this visit.    SURGICAL HISTORY:  Past Surgical History:  Procedure Laterality Date  . BRONCHIAL NEEDLE ASPIRATION BIOPSY   03/24/2020   Procedure: BRONCHIAL NEEDLE ASPIRATION BIOPSIES;  Surgeon: Candee Furbish, MD;  Location: [facility] ENDOSCOPY;  Service: Pulmonary;;  . HEMORRHOID SURGERY    . VIDEO BRONCHOSCOPY WITH ENDOBRONCHIAL ULTRASOUND N/A 03/24/2020   Procedure: VIDEO BRONCHOSCOPY WITH ENDOBRONCHIAL ULTRASOUND;  Surgeon: Candee Furbish, MD;  Location: [facility] ENDOSCOPY;  Service: Pulmonary;  Laterality: N/A;    REVIEW OF SYSTEMS:  A comprehensive review of systems was negative except for: Constitutional: positive for fatigue   PHYSICAL EXAMINATION: General appearance: alert, cooperative, fatigued and no distress Head: Normocephalic, without obvious abnormality, atraumatic Neck: no adenopathy, no JVD, supple, symmetrical, trachea midline and thyroid not enlarged, symmetric, no tenderness/mass/nodules Lymph nodes: Cervical, supraclavicular, and axillary nodes normal. Resp: clear to auscultation bilaterally Back: symmetric, no curvature. ROM normal. No CVA tenderness. Cardio: regular rate and rhythm, S1, S2 normal, no murmur, click, rub or gallop GI: soft, non-tender; bowel sounds normal; no masses,  no organomegaly Extremities: extremities normal, atraumatic, no cyanosis or edema  ECOG PERFORMANCE STATUS: 1 - Symptomatic but completely ambulatory  Blood pressure (!) 117/55, pulse 77, temperature 97.9 F (36.6 C), temperature source Tympanic, resp. rate 15, height 5\' 9"  (1.753 m), weight 144 lb 1.6 oz (65.4 kg), SpO2 95 %.  LABORATORY DATA: Lab Results  Component Value Date   WBC 5.5 12/15/2020   HGB 10.5 (L) 12/15/2020   HCT 33.6 (L) 12/15/2020   MCV 99.7 12/15/2020   PLT 251 12/15/2020      Chemistry      Component Value Date/Time   NA 137 11/24/2020 1110   K 3.7 11/24/2020 1110   CL 104 11/24/2020 1110   CO2 27 11/24/2020 1110   BUN 10 11/24/2020 1110   CREATININE 1.06 11/24/2020 1110      Component Value Date/Time   CALCIUM 8.9 11/24/2020 1110   ALKPHOS 124 11/24/2020 1110   AST 29 11/24/2020  1110   ALT 16 11/24/2020 1110   BILITOT 0.3 11/24/2020 1110       RADIOGRAPHIC STUDIES: MR Brain W Wo Contrast  Result Date: 11/27/2020 CLINICAL DATA:  Metastatic lung carcinoma status post stereotactic radio surgery EXAM: MRI HEAD WITHOUT AND WITH CONTRAST TECHNIQUE: Multiplanar, multiecho pulse sequences of the brain and surrounding structures were obtained without and with intravenous contrast. CONTRAST:  6.78mL GADAVIST GADOBUTROL 1 MMOL/ML IV SOLN COMPARISON:  08/24/2020 FINDINGS: Brain: Minimal periventricular white matter T2-weighted signal hyperintensity is unchanged. Unchanged focus of microhemorrhage in the left cerebellar hemisphere. No acute infarct. Generalized volume loss is unchanged. There are 3 punctate lesions in the cerebellum there are all unchanged in size. The largest is in the posterior left hemisphere and measures 5 mm. These lesions are annotated on series 601. Right frontal meningioma is unchanged in size. A smaller extra-axial lesion over the left convexity measuring 3 mm is also unchanged (601:137). Vascular: Normal flow voids. Skull and upper cervical spine: Normal marrow signal. Sinuses/Orbits: Negative. Other:  None. IMPRESSION: 1. Unchanged size of 3 punctate metastases in the cerebellum. No new lesions. 2. Unchanged right frontal and left convexity extra-axial lesions, likely meningiomas. Electronically Signed   By: Ulyses Jarred M.D.   On: 11/27/2020 23:20    ASSESSMENT AND PLAN: This is a very pleasant 80 years old white male recently diagnosed with a stage IV (T4, N3, M1C) non-small cell lung cancer, adenocarcinoma presented with left upper lobe lung mass in addition to bilateral pulmonary nodules as well as bilateral hilar and mediastinal lymphadenopathy and metastatic disease to the bones and brain diagnosed in July 2021. He underwent stereotactic radiotherapy to the metastatic brain lesion under the care of Dr. Isidore Moos. Unfortunately the molecular studies by  [employer] was negative for actionable mutations.  The initial tissue biopsy was insufficient for molecular studies by foundation 1. The patient had repeat biopsy for the molecular studies and the expectation is to have the result on 05/05/2020. He started systemic chemotherapy with carboplatin for AUC of 5, Alimta 500 mg/M2 and Keytruda 200 mg IV every 3 weeks status post 12 cycles.  Starting from cycle #5 he is on maintenance treatment with Alimta and Keytruda.  The patient has been tolerating this treatment well with no concerning complaints except for the fatigue for almost a week after his treatment. He was supposed to have repeat CT scan of the chest, abdomen and pelvis before this visit but unfortunately for insurance issues it has been delayed.  I recommended for the patient to proceed with cycle #13 today as planned. We will make sure he will get his imaging studies for restaging of his disease before the next cycle of his treatment.  If the scan showed no concerning findings for disease progression and the patient wanted break off chemotherapy I may consider it at that time. His most recent MRI of the brain showed no concerning findings for disease progression in the brain. For the depression he is currently on Cymbalta. He was advised to call immediately if he has any other concerning symptoms in the interval.  Disclaimer: This note was dictated with voice recognition software. Similar sounding words can inadvertently be transcribed and may not be corrected upon review.

## 2020-12-15 NOTE — Patient Instructions (Signed)
[facility] Discharge Instructions for Patients Receiving Chemotherapy  Today you received the following chemotherapy agents: pembrolizumab and pemetrexed.  To help prevent nausea and vomiting after your treatment, we encourage you to take your nausea medication as directed.   If you develop nausea and vomiting that is not controlled by your nausea medication, call the clinic.   BELOW ARE SYMPTOMS THAT SHOULD BE REPORTED IMMEDIATELY:  *FEVER GREATER THAN 100.5 F  *CHILLS WITH OR WITHOUT FEVER  NAUSEA AND VOMITING THAT IS NOT CONTROLLED WITH YOUR NAUSEA MEDICATION  *UNUSUAL SHORTNESS OF BREATH  *UNUSUAL BRUISING OR BLEEDING  TENDERNESS IN MOUTH AND THROAT WITH OR WITHOUT PRESENCE OF ULCERS  *URINARY PROBLEMS  *BOWEL PROBLEMS  UNUSUAL RASH Items with * indicate a potential emergency and should be followed up as soon as possible.  Feel free to call the clinic should you have any questions or concerns. The clinic phone number is [phone].  Please show the [facility] at check-in to the Emergency Department and triage nurse.

## 2021-01-01 ENCOUNTER — Ambulatory Visit (HOSPITAL_COMMUNITY)
Admission: RE | Admit: 2021-01-01 | Discharge: 2021-01-01 | Disposition: A | Discharge status: Home / Self Care | Payer: Medicare PPO | Source: Ambulatory Visit | Attending: Physician Assistant | Admitting: Physician Assistant

## 2021-01-01 ENCOUNTER — Encounter (HOSPITAL_COMMUNITY): Payer: Self-pay

## 2021-01-01 ENCOUNTER — Other Ambulatory Visit: Payer: Self-pay

## 2021-01-01 DIAGNOSIS — C3492 Malignant neoplasm of unspecified part of left bronchus or lung: Secondary | ICD-10-CM | POA: Diagnosis not present

## 2021-01-01 DIAGNOSIS — J90 Pleural effusion, not elsewhere classified: Secondary | ICD-10-CM | POA: Diagnosis not present

## 2021-01-01 DIAGNOSIS — I700 Atherosclerosis of aorta: Secondary | ICD-10-CM | POA: Diagnosis not present

## 2021-01-01 DIAGNOSIS — K4090 Unilateral inguinal hernia, without obstruction or gangrene, not specified as recurrent: Secondary | ICD-10-CM | POA: Diagnosis not present

## 2021-01-01 DIAGNOSIS — C7951 Secondary malignant neoplasm of bone: Secondary | ICD-10-CM | POA: Diagnosis not present

## 2021-01-01 DIAGNOSIS — I2510 Atherosclerotic heart disease of native coronary artery without angina pectoris: Secondary | ICD-10-CM | POA: Diagnosis not present

## 2021-01-01 DIAGNOSIS — K769 Liver disease, unspecified: Secondary | ICD-10-CM | POA: Diagnosis not present

## 2021-01-01 DIAGNOSIS — C3412 Malignant neoplasm of upper lobe, left bronchus or lung: Secondary | ICD-10-CM | POA: Diagnosis not present

## 2021-01-01 IMAGING — CT CT ABD-PELV W/ CM
2 of 5 series · 12 of 36 positions shown, 15 images · IV contrast (OMNIPAQUE)
Comparison: [DATE]

CLINICAL DATA: Stage IV non-small cell lung cancer. Left upper lobe
lung mass/adenocarcinoma. Bone and brain metastasis. Chemotherapy
and radiation therapy complete. On maintenance immunotherapy.

EXAM:
CT CHEST, ABDOMEN, AND PELVIS WITH CONTRAST
TECHNIQUE: Multidetector CT imaging of the chest, abdomen and pelvis was
performed following the standard protocol during bolus
administration of intravenous contrast.
CONTRAST:  100mL OMNIPAQUE IOHEXOL 300 MG/ML  SOLN

[Series 2: cap with · axial · 0.82mm/px · z∈[-354,+141]mm · 9 of 122 slices shown, 12 images]
[im 12/122  mediastinal]
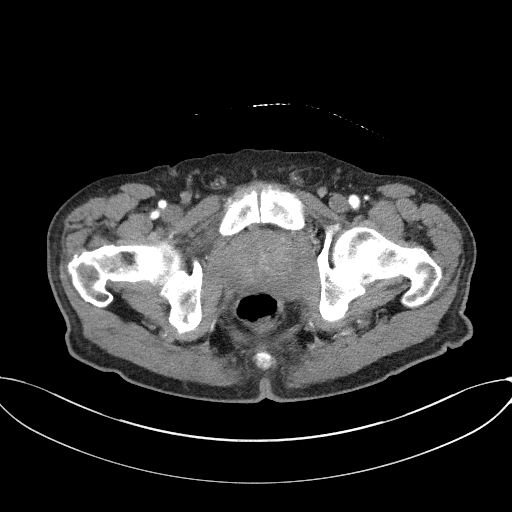
[im 12/122  lung]
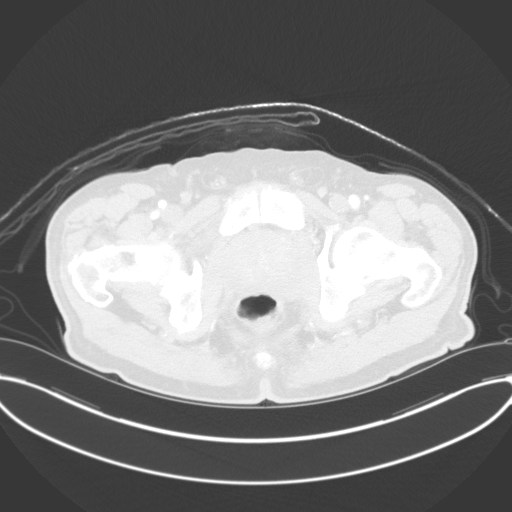
[im 23/122  lung]
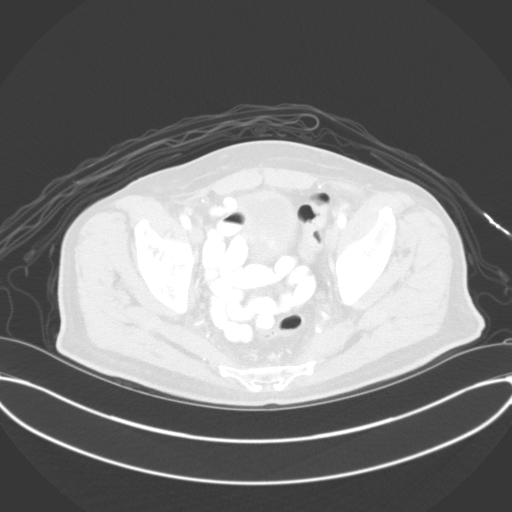
[im 34/122  lung]
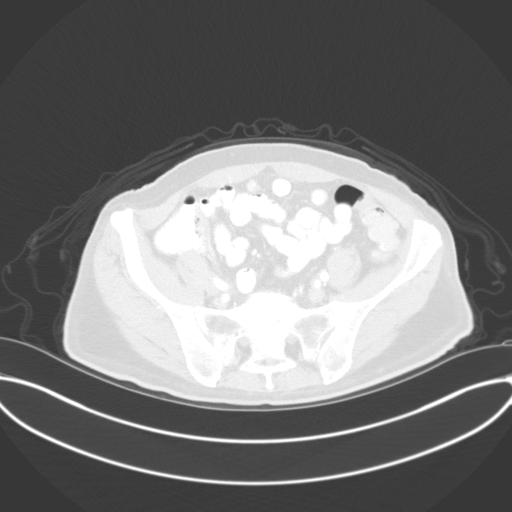
[im 45/122  lung]
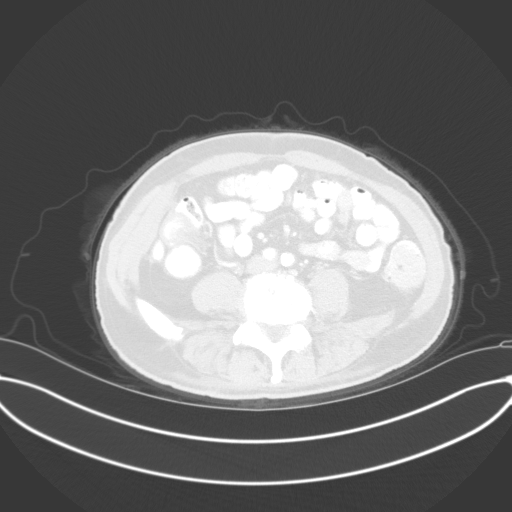
[im 67/122  mediastinal]
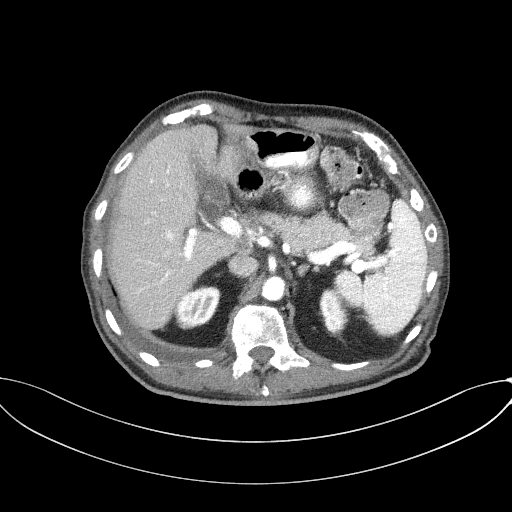
[im 67/122  lung]
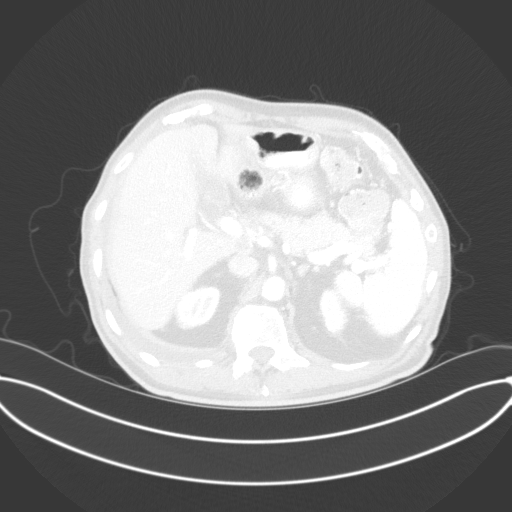
[im 78/122  lung]
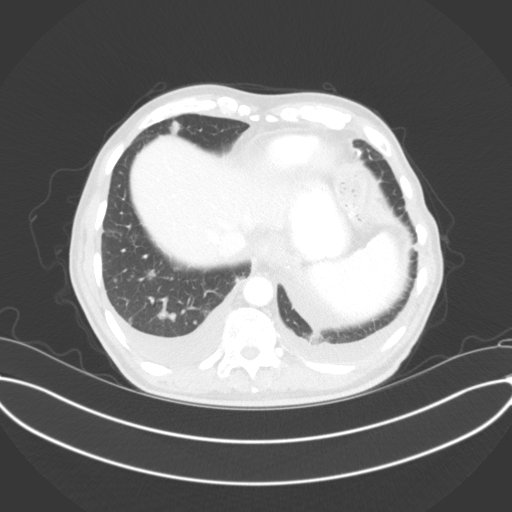
[im 89/122  lung]
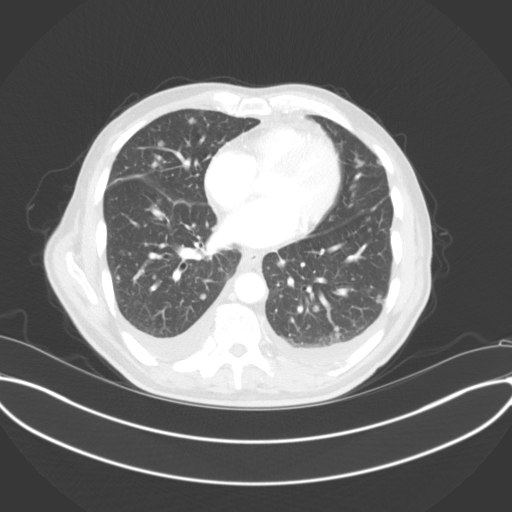
[im 100/122  lung]
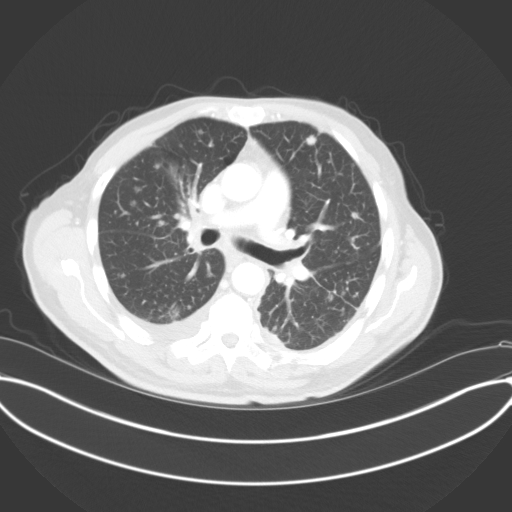
[im 111/122  mediastinal]
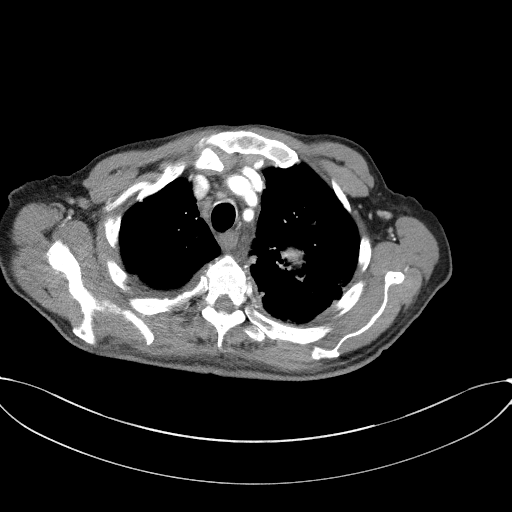
[im 111/122  lung]
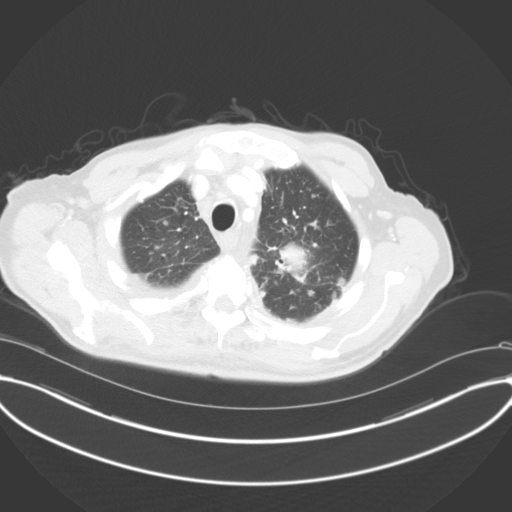

[Series 5: coronals · coronal · 0.77mm/px · 3 of 150 slices shown]
[im 30/150  lung]
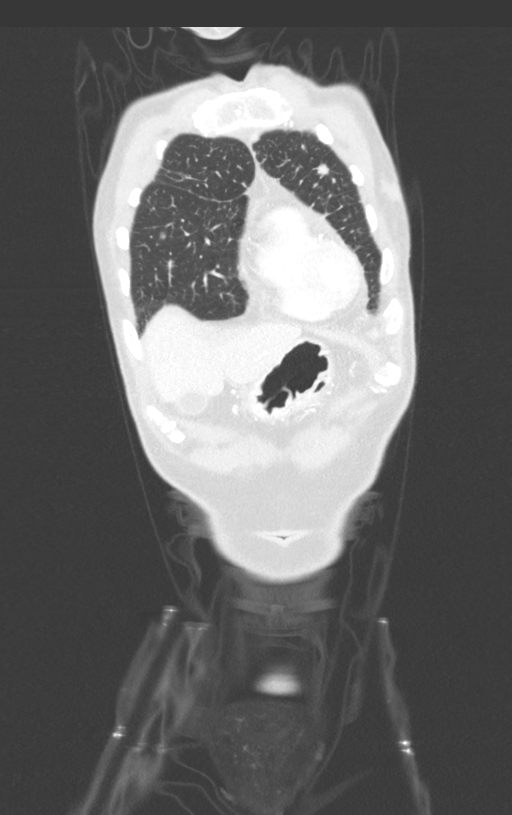
[im 60/150  lung]
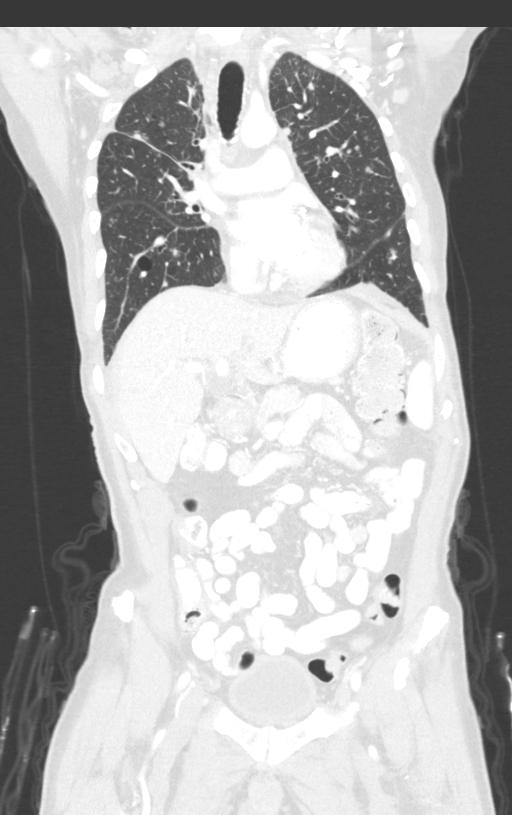
[im 90/150  lung]
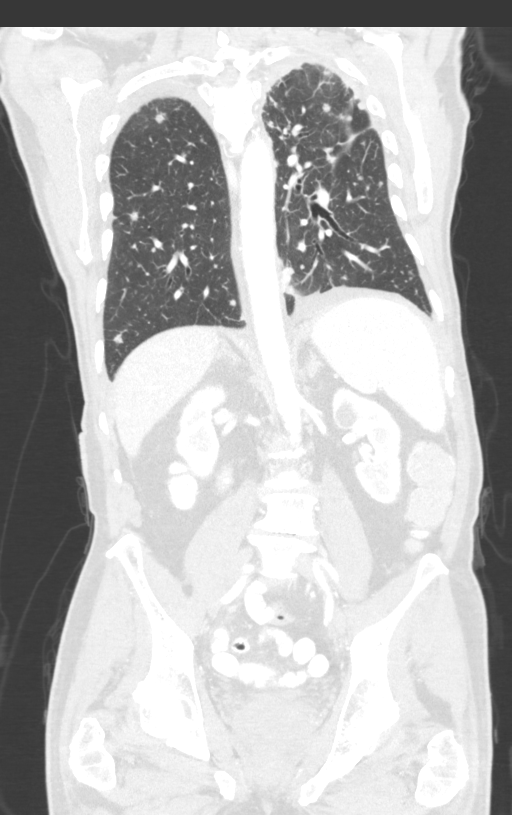

[12 of 36 positions shown; findings below may reference images not displayed]

FINDINGS: CT CHEST FINDINGS

Cardiovascular: Bovine arch. Aortic atherosclerosis. Normal heart
size. Trace pericardial fluid is new but likely physiologic.
Multivessel coronary artery atherosclerosis. No central pulmonary
embolism, on this non-dedicated study.

Mediastinum/Nodes: No supraclavicular adenopathy. Subcarinal node
measures 1.3 cm on [DATE] versus 1.2 cm on the prior. No hilar
adenopathy.

7 mm periesophageal node is similar on 40/2.

Lungs/Pleura: Small left pleural effusion is increased. A small
right pleural effusion is new. Left pleural thickening superiorly
and medially including on [DATE] is suspicious for pleural metastasis.

Left apical macrolobulated lung mass measures 4.9 x 3.9 cm on [DATE]
versus 4.6 x 3.0 cm on the prior exam. Extends superiorly with
probable involvement of the apical chest wall including on [DATE].

Innumerable bilateral pulmonary nodules/metastasis. Index
pleural-based left lower lobe nodule measures 1.3 cm on 90/7 versus
1.4 cm on the prior.

Posteromedial right lower lobe 8 mm nodule on 76/7 is similar to on
the prior exam (when remeasured).

The overall number of diminutive nodules is felt to be increased.

Index nodule within the lateral right upper lobe measures 1.1 cm on
43/7 versus 9 mm on the prior.

Musculoskeletal: Diffuse sclerotic osseous metastasis are similar. A
T10 eccentric left lesion measuring 11 mm today on [DATE] versus 12 mm
on the prior.

CT ABDOMEN PELVIS FINDINGS

Hepatobiliary: 5 mm low-density hepatic lesion is unchanged, too
small to characterize. Normal gallbladder, without biliary ductal
dilatation.

Pancreas: Normal, without mass or ductal dilatation.

Spleen: Normal in size, without focal abnormality.

Adrenals/Urinary Tract: Normal adrenal glands. Upper pole 9 mm left
renal cyst or minimally complex cyst. Normal right kidney, without
hydronephrosis. Moderate median lobe prostate impression into the
urinary bladder.

Stomach/Bowel: Normal stomach, without wall thickening. Colonic
stool burden suggests constipation. Normal terminal ileum. Normal
small bowel.

Vascular/Lymphatic: Aortic atherosclerosis. No abdominopelvic
adenopathy.

Reproductive: Moderate prostatomegaly.

Other: No significant free fluid. Tiny fat containing left inguinal
hernia. No evidence of omental or peritoneal disease.

Musculoskeletal: Sclerotic osseous metastasis. Example right iliac 6
mm lesion on 94/2 is unchanged.

Right-sided L1 lesion of 10 mm is also not significantly changed
IMPRESSION: 1. Interval enlargement of left upper lobe lung mass with extension
into the apical chest wall.
2. Although some pulmonary nodules measure smaller, the overall
pattern is felt to be of progressive pulmonary metastasis, as
evidenced by greater number of smaller nodules.
3. Similar borderline thoracic adenopathy.
4. Similar widespread osseous metastasis.
5. No evidence of soft tissue metastasis within the abdomen or
pelvis.
6. Increased small left and new small right pleural effusion.
Suspicion of left pleural metastasis.
7. Coronary artery atherosclerosis. Aortic Atherosclerosis
([L3]-[L3]).
8.  Possible constipation.
9. Prostatomegaly.

## 2021-01-01 MED ORDER — IOHEXOL 300 MG/ML  SOLN
100.0000 mL | Freq: Once | INTRAMUSCULAR | Status: AC | PRN
  Administered 2021-01-01: 100 mL via INTRAVENOUS

## 2021-01-05 ENCOUNTER — Inpatient Hospital Stay: Payer: Medicare PPO | Attending: Internal Medicine

## 2021-01-05 ENCOUNTER — Other Ambulatory Visit: Payer: Self-pay | Admitting: Medical Oncology

## 2021-01-05 ENCOUNTER — Inpatient Hospital Stay: Payer: Medicare PPO

## 2021-01-05 ENCOUNTER — Other Ambulatory Visit: Payer: Self-pay

## 2021-01-05 ENCOUNTER — Inpatient Hospital Stay: Payer: Medicare PPO | Admitting: Internal Medicine

## 2021-01-05 VITALS — BP 131/60 | HR 91 | Temp 97.9°F | Resp 18 | Ht 69.0 in | Wt 142.2 lb

## 2021-01-05 DIAGNOSIS — Z5112 Encounter for antineoplastic immunotherapy: Secondary | ICD-10-CM | POA: Diagnosis not present

## 2021-01-05 DIAGNOSIS — C3490 Malignant neoplasm of unspecified part of unspecified bronchus or lung: Secondary | ICD-10-CM | POA: Insufficient documentation

## 2021-01-05 DIAGNOSIS — F32A Depression, unspecified: Secondary | ICD-10-CM | POA: Insufficient documentation

## 2021-01-05 DIAGNOSIS — Z79899 Other long term (current) drug therapy: Secondary | ICD-10-CM | POA: Diagnosis not present

## 2021-01-05 DIAGNOSIS — C7931 Secondary malignant neoplasm of brain: Secondary | ICD-10-CM | POA: Diagnosis not present

## 2021-01-05 DIAGNOSIS — R531 Weakness: Secondary | ICD-10-CM | POA: Insufficient documentation

## 2021-01-05 DIAGNOSIS — Z87891 Personal history of nicotine dependence: Secondary | ICD-10-CM | POA: Diagnosis not present

## 2021-01-05 DIAGNOSIS — C3492 Malignant neoplasm of unspecified part of left bronchus or lung: Secondary | ICD-10-CM

## 2021-01-05 DIAGNOSIS — R5383 Other fatigue: Secondary | ICD-10-CM | POA: Insufficient documentation

## 2021-01-05 DIAGNOSIS — C7951 Secondary malignant neoplasm of bone: Secondary | ICD-10-CM | POA: Diagnosis not present

## 2021-01-05 DIAGNOSIS — Z5111 Encounter for antineoplastic chemotherapy: Secondary | ICD-10-CM | POA: Diagnosis not present

## 2021-01-05 LAB — CBC WITH DIFFERENTIAL (CANCER CENTER ONLY)
Abs Immature Granulocytes: 0.02 10*3/uL (ref 0.00–0.07)
Basophils Absolute: 0 10*3/uL (ref 0.0–0.1)
Basophils Relative: 0 %
Eosinophils Absolute: 0.2 10*3/uL (ref 0.0–0.5)
Eosinophils Relative: 3 %
HCT: 31.7 % — ABNORMAL LOW (ref 39.0–52.0)
Hemoglobin: 10.1 g/dL — ABNORMAL LOW (ref 13.0–17.0)
Immature Granulocytes: 0 %
Lymphocytes Relative: 22 %
Lymphs Abs: 1.3 10*3/uL (ref 0.7–4.0)
MCH: 31.1 pg (ref 26.0–34.0)
MCHC: 31.9 g/dL (ref 30.0–36.0)
MCV: 97.5 fL (ref 80.0–100.0)
Monocytes Absolute: 0.7 10*3/uL (ref 0.1–1.0)
Monocytes Relative: 12 %
Neutro Abs: 3.6 10*3/uL (ref 1.7–7.7)
Neutrophils Relative %: 63 %
Platelet Count: 279 10*3/uL (ref 150–400)
RBC: 3.25 MIL/uL — ABNORMAL LOW (ref 4.22–5.81)
RDW: 17 % — ABNORMAL HIGH (ref 11.5–15.5)
WBC Count: 5.7 10*3/uL (ref 4.0–10.5)
nRBC: 0 % (ref 0.0–0.2)

## 2021-01-05 LAB — CMP (CANCER CENTER ONLY)
ALT: 13 U/L (ref 0–44)
AST: 26 U/L (ref 15–41)
Albumin: 2.7 g/dL — ABNORMAL LOW (ref 3.5–5.0)
Alkaline Phosphatase: 126 U/L (ref 38–126)
Anion gap: 10 (ref 5–15)
BUN: 7 mg/dL — ABNORMAL LOW (ref 8–23)
CO2: 25 mmol/L (ref 22–32)
Calcium: 9.2 mg/dL (ref 8.9–10.3)
Chloride: 104 mmol/L (ref 98–111)
Creatinine: 1.1 mg/dL (ref 0.61–1.24)
GFR, Estimated: >60 mL/min (ref >60)
Glucose, Bld: 166 mg/dL — ABNORMAL HIGH (ref 70–99)
Potassium: 3.5 mmol/L (ref 3.5–5.1)
Sodium: 139 mmol/L (ref 135–145)
Total Bilirubin: 0.3 mg/dL (ref 0.3–1.2)
Total Protein: 6.9 g/dL (ref 6.5–8.1)

## 2021-01-05 LAB — TSH: TSH: 3.957 u[IU]/mL (ref 0.320–4.118)

## 2021-01-05 NOTE — Progress Notes (Signed)
Phillip Franklin [phone]   [phone]  OFFICE PROGRESS NOTE  Seward Carol, MD [address] [place] [address]  DIAGNOSIS: Stage IV (T4, N3, M1c) non-small cell lung cancer, adenocarcinoma presented with left upper lobe lung mass in addition to bilateral mediastinal lymphadenopathy as well as bilateral pulmonary nodules and metastatic disease to the bones and brain diagnosed in July 2021.  Molecular studies by [employer] showed no actionable mutations.  Biomarker Findings by foundation 1. Microsatellite status - MS-Stable Tumor Mutational Burden - 5 Muts/Mb Genomic Findings For a complete list of the genes assayed, please refer to the Appendix. KEAP1 V425f*25 STK11 E256* CDKN2A/B p15INK4b R82* 8 Disease relevant genes with no repor  PDL1 expression: 1%  PRIOR THERAPY: None  CURRENT THERAPY: Systemic chemotherapy with carboplatin for AUC of 5, Alimta 500 mg/M2 and Keytruda 200 mg IV every 3 weeks.  First dose 04/06/2020.  Status post 13 cycles.  Starting from cycle #5 he is on maintenance treatment with Alimta and Keytruda every 3 weeks.  Starting cycle #14, I will add carboplatin again for AUC of 5 for the next 4 cycles.  INTERVAL HISTORY: Phillip [id number]. male returns to the clinic today for follow-up visit accompanied by his wife Phillip Franklin  The patient is feeling fine today with no concerning complaints except for mild fatigue.  He denied having any worsening shortness of breath, cough or hemoptysis.  He has no chest pain.  He denied having any significant weight loss or night sweats.  He has no nausea, vomiting, diarrhea or constipation.  He has no headache or visual changes.  He has some weakness in the lower extremities.  He had a fall recently when he was going to the bathroom at the nighttime.  He had some bruises on the back of the head as well as the shoulder area.  He had repeat CT scan of the chest, abdomen  pelvis performed recently and he is here for evaluation and discussion of his scan results.  MEDICAL HISTORY: Past Medical History:  Diagnosis Date  . Depression   . GERD (gastroesophageal reflux disease)    occasional, diet controlled  . High cholesterol   . Hypertension    no longer taking medication - states it's undercontrol  . Hypothyroid     ALLERGIES:  is allergic to codeine, hydrocodone, oxycodone, and penicillins.  MEDICATIONS:  Current Outpatient Medications  Medication Sig Dispense Refill  . acetaminophen (TYLENOL) 325 MG tablet Take 325-650 mg by mouth every 6 (six) hours as needed for moderate pain or headache.    .Marland Kitchenatorvastatin (LIPITOR) 20 MG tablet Take 20 mg by mouth daily.    . benzonatate (TESSALON) 200 MG capsule Take 1 capsule (200 mg total) by mouth 4 (four) times daily as needed for cough. 60 capsule 1  . chlorhexidine (PERIDEX) 0.12 % solution RINSE WITH 15ML THREE TIMES DAILY FOR MOUTH RINSE FOR 1 MINUTE THEN EXPECTORATE    . citalopram (CELEXA) 20 MG tablet Take 1 tablet (20 mg total) by mouth daily. 30 tablet 3  . dronabinol (MARINOL) 2.5 MG capsule TAKE 1 CAPSULE BY MOUTH TWICE DAILY BEFORE MEAL(S) 60 capsule 0  . esomeprazole (NEXIUM) 20 MG capsule Take 20 mg by mouth daily as needed (acid reflux).     . folic acid (FOLVITE) 1 MG tablet Take 1 tablet by mouth once daily 30 tablet 0  . HYDROmorphone (DILAUDID) 2 MG tablet Take 2  mg by mouth 4 (four) times daily.    Marland Kitchen levothyroxine (SYNTHROID) 88 MCG tablet Take 88 mcg by mouth daily before breakfast.    . LORazepam (ATIVAN) 0.5 MG tablet Take 1-2 tablets by mouth 30 minutes before procedures, PRN anxiety. 8 tablet 0  . Multiple Vitamins-Minerals (CENTRUM SILVER 50+MEN) TABS Take 1 tablet by mouth daily.    Marland Kitchen neomycin-polymyxin b-dexamethasone (MAXITROL) 3.5-10000-0.1 SUSP Place 1 drop into the right eye every 8 (eight) hours.    . potassium chloride SA (KLOR-CON) 20 MEQ tablet Take 1 tablet (20 mEq total)  by mouth daily. 6 tablet 0  . prochlorperazine (COMPAZINE) 10 MG tablet Take 1 tablet (10 mg total) by mouth every 6 (six) hours as needed for nausea or vomiting. 30 tablet 0  . sulfamethoxazole-trimethoprim (BACTRIM DS) 800-160 MG tablet Take 1 tablet by mouth 2 (two) times daily. 14 tablet 0  . tamsulosin (FLOMAX) 0.4 MG CAPS capsule Take 0.4 mg by mouth every evening.     . traMADol (ULTRAM) 50 MG tablet Take 1 tablet (50 mg total) by mouth every 6 (six) hours as needed. 30 tablet 0   No current facility-administered medications for this visit.    SURGICAL HISTORY:  Past Surgical History:  Procedure Laterality Date  . BRONCHIAL NEEDLE ASPIRATION BIOPSY  03/24/2020   Procedure: BRONCHIAL NEEDLE ASPIRATION BIOPSIES;  Surgeon: Candee Furbish, MD;  Location: [facility] ENDOSCOPY;  Service: Pulmonary;;  . HEMORRHOID SURGERY    . VIDEO BRONCHOSCOPY WITH ENDOBRONCHIAL ULTRASOUND N/A 03/24/2020   Procedure: VIDEO BRONCHOSCOPY WITH ENDOBRONCHIAL ULTRASOUND;  Surgeon: Candee Furbish, MD;  Location: [facility] ENDOSCOPY;  Service: Pulmonary;  Laterality: N/A;    REVIEW OF SYSTEMS:  Constitutional: positive for fatigue Eyes: negative Ears, nose, mouth, throat, and face: negative Respiratory: positive for dyspnea on exertion Cardiovascular: negative Gastrointestinal: negative Genitourinary:negative Integument/breast: negative Hematologic/lymphatic: negative Musculoskeletal:positive for muscle weakness Neurological: negative Behavioral/Psych: negative Endocrine: negative Allergic/Immunologic: negative   PHYSICAL EXAMINATION: General appearance: alert, cooperative, fatigued and no distress Head: Normocephalic, without obvious abnormality, atraumatic Neck: no adenopathy, no JVD, supple, symmetrical, trachea midline and thyroid not enlarged, symmetric, no tenderness/mass/nodules Lymph nodes: Cervical, supraclavicular, and axillary nodes normal. Resp: clear to auscultation bilaterally Back: symmetric, no  curvature. ROM normal. No CVA tenderness. Cardio: regular rate and rhythm, S1, S2 normal, no murmur, click, rub or gallop GI: soft, non-tender; bowel sounds normal; no masses,  no organomegaly Extremities: extremities normal, atraumatic, no cyanosis or edema Neurologic: Alert and oriented X 3, normal strength and tone. Normal symmetric reflexes. Normal coordination and gait  ECOG PERFORMANCE STATUS: 1 - Symptomatic but completely ambulatory  Blood pressure 131/60, pulse 91, temperature 97.9 F (36.6 C), temperature source Tympanic, resp. rate 18, height '5\' 9"'  (1.753 m), weight 142 lb 3.2 oz (64.5 kg), SpO2 100 %.  LABORATORY DATA: Lab Results  Component Value Date   WBC 5.7 01/05/2021   HGB 10.1 (L) 01/05/2021   HCT 31.7 (L) 01/05/2021   MCV 97.5 01/05/2021   PLT 279 01/05/2021      Chemistry      Component Value Date/Time   NA 139 01/05/2021 1058   K 3.5 01/05/2021 1058   CL 104 01/05/2021 1058   CO2 25 01/05/2021 1058   BUN 7 (L) 01/05/2021 1058   CREATININE 1.10 01/05/2021 1058      Component Value Date/Time   CALCIUM 9.2 01/05/2021 1058   ALKPHOS 126 01/05/2021 1058   AST 26 01/05/2021 1058   ALT 13 01/05/2021 1058  BILITOT 0.3 01/05/2021 1058       RADIOGRAPHIC STUDIES: CT Chest W Contrast  Result Date: 01/03/2021 CLINICAL DATA:  Stage IV non-small cell lung cancer. Left upper lobe lung mass/adenocarcinoma. Bone and brain metastasis. Chemotherapy and radiation therapy complete. On maintenance immunotherapy. EXAM: CT CHEST, ABDOMEN, AND PELVIS WITH CONTRAST TECHNIQUE: Multidetector CT imaging of the chest, abdomen and pelvis was performed following the standard protocol during bolus administration of intravenous contrast. CONTRAST:  112m OMNIPAQUE IOHEXOL 300 MG/ML  SOLN COMPARISON:  10/12/2020 FINDINGS: CT CHEST FINDINGS Cardiovascular: Bovine arch. Aortic atherosclerosis. Normal heart size. Trace pericardial fluid is new but likely physiologic. Multivessel coronary  artery atherosclerosis. No central pulmonary embolism, on this non-dedicated study. Mediastinum/Nodes: No supraclavicular adenopathy. Subcarinal node measures 1.3 cm on 25/2 versus 1.2 cm on the prior. No hilar adenopathy. 7 mm periesophageal node is similar on 40/2. Lungs/Pleura: Small left pleural effusion is increased. A small right pleural effusion is new. Left pleural thickening superiorly and medially including on 19/2 is suspicious for pleural metastasis. Left apical macrolobulated lung mass measures 4.9 x 3.9 cm on 21/7 versus 4.6 x 3.0 cm on the prior exam. Extends superiorly with probable involvement of the apical chest wall including on 05/02. Innumerable bilateral pulmonary nodules/metastasis. Index pleural-based left lower lobe nodule measures 1.3 cm on 90/7 versus 1.4 cm on the prior. Posteromedial right lower lobe 8 mm nodule on 76/7 is similar to on the prior exam (when remeasured). The overall number of diminutive nodules is felt to be increased. Index nodule within the lateral right upper lobe measures 1.1 cm on 43/7 versus 9 mm on the prior. Musculoskeletal: Diffuse sclerotic osseous metastasis are similar. A T10 eccentric left lesion measuring 11 mm today on 31/2 versus 12 mm on the prior. CT ABDOMEN PELVIS FINDINGS Hepatobiliary: 5 mm low-density hepatic lesion is unchanged, too small to characterize. Normal gallbladder, without biliary ductal dilatation. Pancreas: Normal, without mass or ductal dilatation. Spleen: Normal in size, without focal abnormality. Adrenals/Urinary Tract: Normal adrenal glands. Upper pole 9 mm left renal cyst or minimally complex cyst. Normal right kidney, without hydronephrosis. Moderate median lobe prostate impression into the urinary bladder. Stomach/Bowel: Normal stomach, without wall thickening. Colonic stool burden suggests constipation. Normal terminal ileum. Normal small bowel. Vascular/Lymphatic: Aortic atherosclerosis. No abdominopelvic adenopathy.  Reproductive: Moderate prostatomegaly. Other: No significant free fluid. Tiny fat containing left inguinal hernia. No evidence of omental or peritoneal disease. Musculoskeletal: Sclerotic osseous metastasis. Example right iliac 6 mm lesion on 94/2 is unchanged. Right-sided L1 lesion of 10 mm is also not significantly changed IMPRESSION: 1. Interval enlargement of left upper lobe lung mass with extension into the apical chest wall. 2. Although some pulmonary nodules measure smaller, the overall pattern is felt to be of progressive pulmonary metastasis, as evidenced by greater number of smaller nodules. 3. Similar borderline thoracic adenopathy. 4. Similar widespread osseous metastasis. 5. No evidence of soft tissue metastasis within the abdomen or pelvis. 6. Increased small left and new small right pleural effusion. Suspicion of left pleural metastasis. 7. Coronary artery atherosclerosis. Aortic Atherosclerosis (ICD10-I70.0). 8.  Possible constipation. 9. Prostatomegaly. Electronically Signed   By: KAbigail MiyamotoM.D.   On: 01/03/2021 17:41   CT Abdomen Pelvis W Contrast  Result Date: 01/03/2021 CLINICAL DATA:  Stage IV non-small cell lung cancer. Left upper lobe lung mass/adenocarcinoma. Bone and brain metastasis. Chemotherapy and radiation therapy complete. On maintenance immunotherapy. EXAM: CT CHEST, ABDOMEN, AND PELVIS WITH CONTRAST TECHNIQUE: Multidetector CT imaging of the chest, abdomen  and pelvis was performed following the standard protocol during bolus administration of intravenous contrast. CONTRAST:  113m OMNIPAQUE IOHEXOL 300 MG/ML  SOLN COMPARISON:  10/12/2020 FINDINGS: CT CHEST FINDINGS Cardiovascular: Bovine arch. Aortic atherosclerosis. Normal heart size. Trace pericardial fluid is new but likely physiologic. Multivessel coronary artery atherosclerosis. No central pulmonary embolism, on this non-dedicated study. Mediastinum/Nodes: No supraclavicular adenopathy. Subcarinal node measures 1.3 cm on  25/2 versus 1.2 cm on the prior. No hilar adenopathy. 7 mm periesophageal node is similar on 40/2. Lungs/Pleura: Small left pleural effusion is increased. A small right pleural effusion is new. Left pleural thickening superiorly and medially including on 19/2 is suspicious for pleural metastasis. Left apical macrolobulated lung mass measures 4.9 x 3.9 cm on 21/7 versus 4.6 x 3.0 cm on the prior exam. Extends superiorly with probable involvement of the apical chest wall including on 05/02. Innumerable bilateral pulmonary nodules/metastasis. Index pleural-based left lower lobe nodule measures 1.3 cm on 90/7 versus 1.4 cm on the prior. Posteromedial right lower lobe 8 mm nodule on 76/7 is similar to on the prior exam (when remeasured). The overall number of diminutive nodules is felt to be increased. Index nodule within the lateral right upper lobe measures 1.1 cm on 43/7 versus 9 mm on the prior. Musculoskeletal: Diffuse sclerotic osseous metastasis are similar. A T10 eccentric left lesion measuring 11 mm today on 31/2 versus 12 mm on the prior. CT ABDOMEN PELVIS FINDINGS Hepatobiliary: 5 mm low-density hepatic lesion is unchanged, too small to characterize. Normal gallbladder, without biliary ductal dilatation. Pancreas: Normal, without mass or ductal dilatation. Spleen: Normal in size, without focal abnormality. Adrenals/Urinary Tract: Normal adrenal glands. Upper pole 9 mm left renal cyst or minimally complex cyst. Normal right kidney, without hydronephrosis. Moderate median lobe prostate impression into the urinary bladder. Stomach/Bowel: Normal stomach, without wall thickening. Colonic stool burden suggests constipation. Normal terminal ileum. Normal small bowel. Vascular/Lymphatic: Aortic atherosclerosis. No abdominopelvic adenopathy. Reproductive: Moderate prostatomegaly. Other: No significant free fluid. Tiny fat containing left inguinal hernia. No evidence of omental or peritoneal disease. Musculoskeletal:  Sclerotic osseous metastasis. Example right iliac 6 mm lesion on 94/2 is unchanged. Right-sided L1 lesion of 10 mm is also not significantly changed IMPRESSION: 1. Interval enlargement of left upper lobe lung mass with extension into the apical chest wall. 2. Although some pulmonary nodules measure smaller, the overall pattern is felt to be of progressive pulmonary metastasis, as evidenced by greater number of smaller nodules. 3. Similar borderline thoracic adenopathy. 4. Similar widespread osseous metastasis. 5. No evidence of soft tissue metastasis within the abdomen or pelvis. 6. Increased small left and new small right pleural effusion. Suspicion of left pleural metastasis. 7. Coronary artery atherosclerosis. Aortic Atherosclerosis (ICD10-I70.0). 8.  Possible constipation. 9. Prostatomegaly. Electronically Signed   By: KAbigail MiyamotoM.D.   On: 01/03/2021 17:41    ASSESSMENT AND PLAN: This is a very pleasant 80years old white male recently diagnosed with a stage IV (T4, N3, M1C) non-small cell lung cancer, adenocarcinoma presented with left upper lobe lung mass in addition to bilateral pulmonary nodules as well as bilateral hilar and mediastinal lymphadenopathy and metastatic disease to the bones and brain diagnosed in July 2021. He underwent stereotactic radiotherapy to the metastatic brain lesion under the care of Dr. SIsidore Moos Unfortunately the molecular studies by [employer] was negative for actionable mutations.  The initial tissue biopsy was insufficient for molecular studies by foundation 1. The patient had repeat biopsy for the molecular studies and the expectation  is to have the result on 05/05/2020. He started systemic chemotherapy with carboplatin for AUC of 5, Alimta 500 mg/M2 and Keytruda 200 mg IV every 3 weeks status post 13 cycles.  Starting from cycle #5 he is on maintenance treatment with Alimta and Keytruda.  The patient has been tolerating this treatment well with no concerning adverse  effect except for fatigue. He had repeat CT scan of the chest, abdomen pelvis performed recently.  I personally and independently reviewed the scan images and discussed the result and showed the images to the patient and his wife. Unfortunately his scan showed interval enlargement of the left upper lobe lung mass with extension into the apical chest wall and some increase on the small pulmonary nodules suggestive of disease progression. I had a lengthy discussion with the patient and his wife about his current disease status and treatment options.  Unfortunately with the slow disease progression, I discussed with the patient several options for management of his condition including adding carboplatin again to his maintenance treatment versus switching to a different chemotherapy regiment which I think will be a little bit harsh on this patient with his current condition versus palliative care which they are not interested in at this point at least. The patient and his wife agreed to the addition of carboplatin to the current regimen and he will start the next dose of his treatment next week. He will come back for follow-up visit in 4 weeks for evaluation with the start of cycle #15. I also strongly advised him not to drive his car with his current condition. For the depression he is currently on Cymbalta. The patient was advised to call immediately if he has any other concerning symptoms in the interval.  Disclaimer: This note was dictated with voice recognition software. Similar sounding words can inadvertently be transcribed and may not be corrected upon review.

## 2021-01-06 ENCOUNTER — Telehealth: Payer: Self-pay | Admitting: Medical Oncology

## 2021-01-06 ENCOUNTER — Telehealth: Payer: Self-pay

## 2021-01-06 NOTE — Telephone Encounter (Signed)
err

## 2021-01-06 NOTE — Telephone Encounter (Signed)
Phillip Franklin with [facility] called to indicate they cannot accept this pt at this time but they forwarded his referral to [facility] who was able to accept the pt.

## 2021-01-06 NOTE — Telephone Encounter (Signed)
PT order faxed to [facility].

## 2021-01-09 DIAGNOSIS — E039 Hypothyroidism, unspecified: Secondary | ICD-10-CM | POA: Diagnosis not present

## 2021-01-09 DIAGNOSIS — E7800 Pure hypercholesterolemia, unspecified: Secondary | ICD-10-CM | POA: Diagnosis not present

## 2021-01-09 DIAGNOSIS — I10 Essential (primary) hypertension: Secondary | ICD-10-CM | POA: Diagnosis not present

## 2021-01-09 DIAGNOSIS — C3412 Malignant neoplasm of upper lobe, left bronchus or lung: Secondary | ICD-10-CM | POA: Diagnosis not present

## 2021-01-09 DIAGNOSIS — G3184 Mild cognitive impairment, so stated: Secondary | ICD-10-CM | POA: Diagnosis not present

## 2021-01-09 DIAGNOSIS — C7951 Secondary malignant neoplasm of bone: Secondary | ICD-10-CM | POA: Diagnosis not present

## 2021-01-09 DIAGNOSIS — C7931 Secondary malignant neoplasm of brain: Secondary | ICD-10-CM | POA: Diagnosis not present

## 2021-01-09 DIAGNOSIS — F32A Depression, unspecified: Secondary | ICD-10-CM | POA: Diagnosis not present

## 2021-01-09 DIAGNOSIS — K219 Gastro-esophageal reflux disease without esophagitis: Secondary | ICD-10-CM | POA: Diagnosis not present

## 2021-01-11 ENCOUNTER — Telehealth: Payer: Self-pay

## 2021-01-11 ENCOUNTER — Telehealth: Payer: Self-pay | Admitting: Internal Medicine

## 2021-01-11 NOTE — Telephone Encounter (Signed)
Spoke with pt's wife as requested. Pt's wife states that she cannot bring her husband to his appt on 4/27 due to vertigo. Informed pt's wife that he can use [facility]'s transportation service if he qualifies. Pt's wife states that she is unsure if she can provide the assistance he needs at home after his infusion on 01/13/21. Pt's wife would still like to sign up for [facility] transportation service. Informed pt's wife that request to have [place] transportation service and scheduling department call them will be made. Pt's wife verbalizes understanding and agrees with plan of care.

## 2021-01-11 NOTE — Telephone Encounter (Signed)
Called pt's wife about r/s appt per 4/25 sch msg. No answer. Left msg for her to call me back to talk about options for r/s.

## 2021-01-12 ENCOUNTER — Telehealth: Payer: Self-pay | Admitting: Internal Medicine

## 2021-01-12 ENCOUNTER — Telehealth: Payer: Self-pay

## 2021-01-12 NOTE — Telephone Encounter (Signed)
Scheduled per 04/19 los, spoke with patient's relative and patient will be notified of upcoming appointment.

## 2021-01-12 NOTE — Telephone Encounter (Signed)
Pts wife called 01/11/21 wanting to cancel pts appt and infusion on 4/27 because she is currently unwell but it was unclear if they were okay with using [facility] transportation to allow the pt to still make his treatment. I have called the pt to clarify this and he requested the appts be cancelled and will skip this treatment until next time.  I then called pts wife just to make sure because she handles all of the pts scheduling. She confirmed that pt does not want to come to the appt alone and they agreed he will skip this appt to allow her time to start feeling better. She is aware of his next appt on 02/03/21.

## 2021-01-13 ENCOUNTER — Inpatient Hospital Stay: Payer: Medicare PPO

## 2021-01-13 ENCOUNTER — Other Ambulatory Visit: Payer: Medicare PPO

## 2021-01-14 DIAGNOSIS — C7931 Secondary malignant neoplasm of brain: Secondary | ICD-10-CM | POA: Diagnosis not present

## 2021-01-14 DIAGNOSIS — I350 Nonrheumatic aortic (valve) stenosis: Secondary | ICD-10-CM | POA: Diagnosis not present

## 2021-01-14 DIAGNOSIS — K219 Gastro-esophageal reflux disease without esophagitis: Secondary | ICD-10-CM | POA: Diagnosis not present

## 2021-01-14 DIAGNOSIS — E7800 Pure hypercholesterolemia, unspecified: Secondary | ICD-10-CM | POA: Diagnosis not present

## 2021-01-14 DIAGNOSIS — E039 Hypothyroidism, unspecified: Secondary | ICD-10-CM | POA: Diagnosis not present

## 2021-01-14 DIAGNOSIS — C3412 Malignant neoplasm of upper lobe, left bronchus or lung: Secondary | ICD-10-CM | POA: Diagnosis not present

## 2021-01-14 DIAGNOSIS — C3492 Malignant neoplasm of unspecified part of left bronchus or lung: Secondary | ICD-10-CM | POA: Diagnosis not present

## 2021-01-14 DIAGNOSIS — C7951 Secondary malignant neoplasm of bone: Secondary | ICD-10-CM | POA: Diagnosis not present

## 2021-01-14 DIAGNOSIS — G3184 Mild cognitive impairment, so stated: Secondary | ICD-10-CM | POA: Diagnosis not present

## 2021-01-14 DIAGNOSIS — F32A Depression, unspecified: Secondary | ICD-10-CM | POA: Diagnosis not present

## 2021-01-14 DIAGNOSIS — I10 Essential (primary) hypertension: Secondary | ICD-10-CM | POA: Diagnosis not present

## 2021-01-15 ENCOUNTER — Other Ambulatory Visit: Payer: Self-pay | Admitting: Internal Medicine

## 2021-01-15 ENCOUNTER — Other Ambulatory Visit: Payer: Self-pay | Admitting: Physician Assistant

## 2021-01-15 DIAGNOSIS — R634 Abnormal weight loss: Secondary | ICD-10-CM

## 2021-01-19 ENCOUNTER — Telehealth: Payer: Self-pay | Admitting: Medical Oncology

## 2021-01-19 NOTE — Telephone Encounter (Signed)
Received notice that [employer] dismissed the request  filed on 04/26.22 for Alimta. Request withdrawn per provider request.

## 2021-01-21 DIAGNOSIS — K219 Gastro-esophageal reflux disease without esophagitis: Secondary | ICD-10-CM | POA: Diagnosis not present

## 2021-01-21 DIAGNOSIS — C3412 Malignant neoplasm of upper lobe, left bronchus or lung: Secondary | ICD-10-CM | POA: Diagnosis not present

## 2021-01-21 DIAGNOSIS — E039 Hypothyroidism, unspecified: Secondary | ICD-10-CM | POA: Diagnosis not present

## 2021-01-21 DIAGNOSIS — E7800 Pure hypercholesterolemia, unspecified: Secondary | ICD-10-CM | POA: Diagnosis not present

## 2021-01-21 DIAGNOSIS — F32A Depression, unspecified: Secondary | ICD-10-CM | POA: Diagnosis not present

## 2021-01-21 DIAGNOSIS — C7951 Secondary malignant neoplasm of bone: Secondary | ICD-10-CM | POA: Diagnosis not present

## 2021-01-21 DIAGNOSIS — I10 Essential (primary) hypertension: Secondary | ICD-10-CM | POA: Diagnosis not present

## 2021-01-21 DIAGNOSIS — C7931 Secondary malignant neoplasm of brain: Secondary | ICD-10-CM | POA: Diagnosis not present

## 2021-01-21 DIAGNOSIS — G3184 Mild cognitive impairment, so stated: Secondary | ICD-10-CM | POA: Diagnosis not present

## 2021-01-26 ENCOUNTER — Ambulatory Visit: Payer: Medicare PPO | Admitting: Internal Medicine

## 2021-01-26 ENCOUNTER — Ambulatory Visit: Payer: Medicare PPO

## 2021-01-26 ENCOUNTER — Other Ambulatory Visit: Payer: Medicare PPO

## 2021-01-26 DIAGNOSIS — C7931 Secondary malignant neoplasm of brain: Secondary | ICD-10-CM | POA: Diagnosis not present

## 2021-01-26 DIAGNOSIS — E7800 Pure hypercholesterolemia, unspecified: Secondary | ICD-10-CM | POA: Diagnosis not present

## 2021-01-26 DIAGNOSIS — C7951 Secondary malignant neoplasm of bone: Secondary | ICD-10-CM | POA: Diagnosis not present

## 2021-01-26 DIAGNOSIS — F32A Depression, unspecified: Secondary | ICD-10-CM | POA: Diagnosis not present

## 2021-01-26 DIAGNOSIS — I10 Essential (primary) hypertension: Secondary | ICD-10-CM | POA: Diagnosis not present

## 2021-01-26 DIAGNOSIS — G3184 Mild cognitive impairment, so stated: Secondary | ICD-10-CM | POA: Diagnosis not present

## 2021-01-26 DIAGNOSIS — C3412 Malignant neoplasm of upper lobe, left bronchus or lung: Secondary | ICD-10-CM | POA: Diagnosis not present

## 2021-01-26 DIAGNOSIS — E039 Hypothyroidism, unspecified: Secondary | ICD-10-CM | POA: Diagnosis not present

## 2021-01-26 DIAGNOSIS — K219 Gastro-esophageal reflux disease without esophagitis: Secondary | ICD-10-CM | POA: Diagnosis not present

## 2021-01-29 ENCOUNTER — Other Ambulatory Visit: Payer: Self-pay | Admitting: Internal Medicine

## 2021-01-31 NOTE — Progress Notes (Signed)
[facility] OFFICE PROGRESS NOTE  Seward Carol, MD [address] [place] [address]  DIAGNOSIS: Stage IV (T4, N3, M1c) non-small cell lung cancer, adenocarcinoma presented with left upper lobe lung mass in addition to bilateral mediastinal lymphadenopathy as well as bilateral pulmonary nodules and metastatic disease to the bones and brain diagnosed in July 2021.  Molecular studies by [employer] showed no actionable mutations.  Biomarker Findings by foundation 1. Microsatellite status - MS-Stable Tumor Mutational Burden - 5 Muts/Mb Genomic Findings For a complete list of the genes assayed, please refer to the Appendix. KEAP1 V476f*25 STK11 E256* CDKN2A/B p15INK4b R82* 8 Disease relevant genes with no repor  PDL1 expression: 1%  PRIOR THERAPY: SRS to the metastatic brain lesion under the care of Dr. SIsidore Mooson 05/04/20  CURRENT THERAPY: Systemic chemotherapy with carboplatin for AUC of 5, Alimta 500 mg/M2 and Keytruda 200 mg IV every 3 weeks.  First dose 04/06/2020.  Status post 13 cycles.  Starting from cycle #5 he is on maintenance treatment with Alimta and Keytruda every 3 weeks.  Starting cycle #14, Dr. MJulien Nordmannadded carboplatin again for AUC of 5 for the next 4 cycles  INTERVAL HISTORY: Phillip Franklin 80y.o. male returns to the clinic today for a follow-up visit accompanied by his wife.  The patient recently showed evidence of disease progression on his last restaging CT scan performed.  Therefore, Dr. MJulien Nordmanndiscussed several options with the patient.  Dr. MJulien Nordmanndiscussed adding carboplatin again to his maintenance treatment for 4 cycles or switching to a different chemotherapy regimen which he thinks will be a little harsh on the patient with his current condition vs palliative care which they were not interested in.   The patient opted to add back in 4 cycles of carboplatin to his maintenance treatment.  [name] here to start his first dose  today.  The patient delayed treatment by a few weeks due to his wife having vertigo and possible TIA.  Otherwise, the patient states he feels "so-so" today because he is not where he wants to be.  The patient endorses fatigue and phlegm from post nasal drainage. [name] currently undergoing physical therapy. He denies changes with his breathing including cough or worsening dyspnea on exertion. [name] a little hypotensive today which his wife is concerned about. [name] not taking any anti-hypertensive's. He did take flomax this morning though. His wife states he drinks plenty of fluid. His wife also states he has been eating high protein food. He lost a few pounds but thinks his appetite is a little better. He takes Marinol for appetite. He denies any hemoptysis or chest pain.  He denies any significant weight loss or night sweats.  He denies any nausea, vomiting, diarrhea, or constipation.  He denies any headache or visual changes.  The patient is here today for evaluation before starting cycle #14.  MEDICAL HISTORY: Past Medical History:  Diagnosis Date  . Depression   . GERD (gastroesophageal reflux disease)    occasional, diet controlled  . High cholesterol   . Hypertension    no longer taking medication - states it's undercontrol  . Hypothyroid     ALLERGIES:  is allergic to codeine, hydrocodone, oxycodone, and penicillins.  MEDICATIONS:  Current Outpatient Medications  Medication Sig Dispense Refill  . acetaminophen (TYLENOL) 325 MG tablet Take 325-650 mg by mouth every 6 (six) hours as needed for moderate pain or headache.    . benzonatate (  TESSALON) 200 MG capsule Take 1 capsule (200 mg total) by mouth 4 (four) times daily as needed for cough. 60 capsule 1  . chlorhexidine (PERIDEX) 0.12 % solution RINSE WITH 15ML THREE TIMES DAILY FOR MOUTH RINSE FOR 1 MINUTE THEN EXPECTORATE    . citalopram (CELEXA) 20 MG tablet Take 1 tablet (20 mg total) by mouth daily. 30 tablet 3  . dronabinol  (MARINOL) 2.5 MG capsule TAKE 1 CAPSULE BY MOUTH TWICE DAILY BEFORE MEAL(S) 60 capsule 0  . esomeprazole (NEXIUM) 20 MG capsule Take 20 mg by mouth daily as needed (acid reflux).     . folic acid (FOLVITE) 1 MG tablet Take 1 tablet by mouth once daily 30 tablet 0  . levothyroxine (SYNTHROID) 88 MCG tablet Take 88 mcg by mouth daily before breakfast.    . tamsulosin (FLOMAX) 0.4 MG CAPS capsule Take 0.4 mg by mouth every evening.     Marland Kitchen LORazepam (ATIVAN) 0.5 MG tablet Take 1-2 tablets by mouth 30 minutes before procedures, PRN anxiety. (Patient not taking: Reported on 02/03/2021) 8 tablet 0  . prochlorperazine (COMPAZINE) 10 MG tablet TAKE 1 TABLET BY MOUTH EVERY 6 HOURS AS NEEDED FOR NAUSEA OR VOMITING (Patient not taking: Reported on 02/03/2021) 30 tablet 0   No current facility-administered medications for this visit.    SURGICAL HISTORY:  Past Surgical History:  Procedure Laterality Date  . BRONCHIAL NEEDLE ASPIRATION BIOPSY  03/24/2020   Procedure: BRONCHIAL NEEDLE ASPIRATION BIOPSIES;  Surgeon: Candee Furbish, MD;  Location[facility] END[facility]COPY;  Service: Pulmonary;;  . HEMORRHOID SURGERY    . VIDEO BRONCHOSCOPY WITH ENDOBRONCHIAL ULTRASOUND N/A 03/24/2020   Procedure: VIDEO BRONCHOSCOPY WITH ENDOBRONCHIAL ULTRASOUND;  Surgeon: Candee Furbish, MD;  Location: [facility] ENDOSCOPY;  Service: Pulmonary;  Laterality: N/A;    REVIEW OF SYSTEMS:   Review of Systems  Constitutional:Positive for fatigue and weight loss.Negative for appetite change, chills, and fever. HENT: Negative for mouth sores, nosebleeds, sore throat and trouble swallowing.  Eyes: Negative for eye problems and icterus.  Respiratory:Positive for stable baseline dyspnea on exertion.Negative for cough, hemoptysis, and wheezing.  Cardiovascular: Negative for chest pain and leg swelling.  Gastrointestinal: Negative for abdominal pain, constipation, diarrhea, nausea and vomiting.  Genitourinary: Negative for bladder incontinence,  difficulty urinating, dysuria, frequency and hematuria.  Musculoskeletal: Negative for back pain, gait problem, neck pain and neck stiffness.  Skin: Negative for itching and rash.  Neurological: Negative for dizziness, extremity weakness, gait problem, headaches, light-headedness and seizures.  Hematological: Negative for adenopathy. Does not bruise/bleed easily.  Psychiatric/Behavioral: Positive for depression. Negative for confusion, and sleep disturbance. The patient is not nervous/anxious.    PHYSICAL EXAMINATION:  Blood pressure (!) 103/38, pulse 80, temperature 98.3 F (36.8 C), temperature source Oral, resp. rate 17, weight 138 lb 1.6 oz (62.6 kg), SpO2 99 %.  ECOG PERFORMANCE STATUS: 2 - Symptomatic, <50% confined to bed  Physical Exam  Constitutional: Oriented to person, place, and time and thin appearing male and in no distress.  HENT:  Head: Normocephalic and atraumatic.  Mouth/Throat: Oropharynx is clear and moist. No oropharyngeal exudate.  Eyes: Conjunctivae are normal. Right eye exhibits no discharge. Left eye exhibits no discharge. No scleral icterus.  Neck: Normal range of motion. Neck supple.  Cardiovascular: Normal rate, regularrhythm, systolic murmur noted and intact distal pulses.  Pulmonary/Chest: Effort normal and breath sounds normal. No respiratory distress. No wheezes. No rales.  Abdominal: Soft. Bowel sounds are normal. Exhibits no distension and no mass. There is no  tenderness.  Musculoskeletal: Normal range of motion. Exhibits no edema.  Lymphadenopathy:  No cervical adenopathy. Neurological: Alert and oriented to person, place, and time. Exhibits muscle wasting. Examined in the wheelchair. Skin: Skin is warm and dry. No rash noted. Not diaphoretic. No erythema. No pallor.  Psychiatric: Mood, memory and judgment normal.  Vitals reviewed.  LABORATORY DATA: Lab Results  Component Value Date   WBC 6.1 02/03/2021   HGB 10.9 (L) 02/03/2021   HCT 34.0  (L) 02/03/2021   MCV 92.6 02/03/2021   PLT 151 02/03/2021      Chemistry      Component Value Date/Time   NA 141 02/03/2021 1116   K 3.5 02/03/2021 1116   CL 103 02/03/2021 1116   CO2 28 02/03/2021 1116   BUN 9 02/03/2021 1116   CREATININE 1.03 02/03/2021 1116      Component Value Date/Time   CALCIUM 9.8 02/03/2021 1116   ALKPHOS 127 (H) 02/03/2021 1116   AST 25 02/03/2021 1116   ALT 10 02/03/2021 1116   BILITOT 0.4 02/03/2021 1116       RADIOGRAPHIC STUDIES:  No results found.   ASSESSMENT/PLAN:  This is a very pleasant 80 year old male recently diagnosed with stage IV (T4,N3,M1C) non-small cell lung cancer, adenocarcinoma. He presented with a left upper lobe lung mass in addition to bilateral pulmonary nodules as well as bilateral hilar and mediastinal lymphadenopathy. He also has metastatic disease to the bones and brain. He was diagnosed in July 2021. His molecular studies performed by [employer] was negative for any actionable mutations. The initial tissue biopsy was insufficient for molecular studies by foundation 1. The patient had repeat biopsy for the molecular studieswhich were negative for any actionable mutations.   He completed SRS to the brain lesion under the care of Dr. Isidore Moos. This was completed on 05/04/20.  [name] currently undergoing palliative systemic chemotherapy with carboplatin for an AUC of 5, Alimta 500 mg per metered squared, Keytruda 200 mg IV every 3 weeks. [name] status post13cycles. Starting from cycle #5, the patienthas been onmaintenance Keytruda and Alimta. Starting from cycle #14, Dr. Julien Nordmann added back carboplatin due to evidence of disease progression which will be added for the next 4 cycles.   [name] here today for cycle #14. Labs were reviewed. Recommend he proceed with cycle #14 today which will include carboplatin, alimta, and keytruda.   We will monitor his labs closely weekly. I have included on my LOS to make weekly lab  appointments.   [name] a little hypotensive today. He drinks plenty of fluid. [name] not on any-antihypertensives. Possibly flomax contributing? He took this this morning. We will arrange for him to receive 1 L over 2 hours of normal saline today.   He will continue to take marinol for his appetite. He also was encouraged to continue to eat foods high in protein.   We will see him back for a follow up visit in 3 weeks for evaluation before starting cycle #15.   The patient was advised to call immediately if he has any concerning symptoms in the interval. The patient voices understanding of current disease status and treatment options and is in agreement with the current care plan. All questions were answered. The patient knows to call the clinic with any problems, questions or concerns. We can certainly see the patient much sooner if necessary    Orders Placed This Encounter  Procedures  . CBC with Differential ([facility])  Standing Status:   Standing    Number of Occurrences:   12    Standing Expiration Date:   02/03/2022  . CMP ([facility] only)    Standing Status:   Standing    Number of Occurrences:   12    Standing Expiration Date:   02/03/2022     I spent 20-29 minutes in this encounter.   Noriko Macari L Sayaka Hoeppner, PA-C 02/03/21

## 2021-02-02 DIAGNOSIS — N189 Chronic kidney disease, unspecified: Secondary | ICD-10-CM | POA: Diagnosis not present

## 2021-02-02 DIAGNOSIS — G3184 Mild cognitive impairment, so stated: Secondary | ICD-10-CM | POA: Diagnosis not present

## 2021-02-02 DIAGNOSIS — K219 Gastro-esophageal reflux disease without esophagitis: Secondary | ICD-10-CM | POA: Diagnosis not present

## 2021-02-02 DIAGNOSIS — C3412 Malignant neoplasm of upper lobe, left bronchus or lung: Secondary | ICD-10-CM | POA: Diagnosis not present

## 2021-02-02 DIAGNOSIS — F32A Depression, unspecified: Secondary | ICD-10-CM | POA: Diagnosis not present

## 2021-02-02 DIAGNOSIS — C3492 Malignant neoplasm of unspecified part of left bronchus or lung: Secondary | ICD-10-CM | POA: Diagnosis not present

## 2021-02-02 DIAGNOSIS — C7951 Secondary malignant neoplasm of bone: Secondary | ICD-10-CM | POA: Diagnosis not present

## 2021-02-02 DIAGNOSIS — Z9181 History of falling: Secondary | ICD-10-CM | POA: Diagnosis not present

## 2021-02-02 DIAGNOSIS — I10 Essential (primary) hypertension: Secondary | ICD-10-CM | POA: Diagnosis not present

## 2021-02-02 DIAGNOSIS — C7931 Secondary malignant neoplasm of brain: Secondary | ICD-10-CM | POA: Diagnosis not present

## 2021-02-02 DIAGNOSIS — R54 Age-related physical debility: Secondary | ICD-10-CM | POA: Diagnosis not present

## 2021-02-02 DIAGNOSIS — E7800 Pure hypercholesterolemia, unspecified: Secondary | ICD-10-CM | POA: Diagnosis not present

## 2021-02-02 DIAGNOSIS — E039 Hypothyroidism, unspecified: Secondary | ICD-10-CM | POA: Diagnosis not present

## 2021-02-03 ENCOUNTER — Ambulatory Visit: Payer: Medicare PPO

## 2021-02-03 ENCOUNTER — Inpatient Hospital Stay: Payer: Medicare PPO

## 2021-02-03 ENCOUNTER — Other Ambulatory Visit: Payer: Self-pay | Admitting: Internal Medicine

## 2021-02-03 ENCOUNTER — Other Ambulatory Visit: Payer: Medicare PPO

## 2021-02-03 ENCOUNTER — Inpatient Hospital Stay: Payer: Medicare PPO | Admitting: Physician Assistant

## 2021-02-03 ENCOUNTER — Ambulatory Visit: Payer: Medicare PPO | Admitting: Physician Assistant

## 2021-02-03 ENCOUNTER — Other Ambulatory Visit: Payer: Self-pay

## 2021-02-03 ENCOUNTER — Inpatient Hospital Stay: Payer: Medicare PPO | Attending: Internal Medicine

## 2021-02-03 VITALS — BP 103/38 | HR 80 | Temp 98.3°F | Resp 17 | Wt 138.1 lb

## 2021-02-03 DIAGNOSIS — I959 Hypotension, unspecified: Secondary | ICD-10-CM | POA: Diagnosis not present

## 2021-02-03 DIAGNOSIS — C7951 Secondary malignant neoplasm of bone: Secondary | ICD-10-CM | POA: Insufficient documentation

## 2021-02-03 DIAGNOSIS — C3492 Malignant neoplasm of unspecified part of left bronchus or lung: Secondary | ICD-10-CM

## 2021-02-03 DIAGNOSIS — Z5112 Encounter for antineoplastic immunotherapy: Secondary | ICD-10-CM | POA: Diagnosis not present

## 2021-02-03 DIAGNOSIS — F32A Depression, unspecified: Secondary | ICD-10-CM | POA: Diagnosis not present

## 2021-02-03 DIAGNOSIS — Z5111 Encounter for antineoplastic chemotherapy: Secondary | ICD-10-CM | POA: Insufficient documentation

## 2021-02-03 DIAGNOSIS — R0982 Postnasal drip: Secondary | ICD-10-CM | POA: Insufficient documentation

## 2021-02-03 DIAGNOSIS — Z87891 Personal history of nicotine dependence: Secondary | ICD-10-CM | POA: Diagnosis not present

## 2021-02-03 DIAGNOSIS — C7931 Secondary malignant neoplasm of brain: Secondary | ICD-10-CM | POA: Diagnosis not present

## 2021-02-03 DIAGNOSIS — R5383 Other fatigue: Secondary | ICD-10-CM | POA: Insufficient documentation

## 2021-02-03 DIAGNOSIS — Z79899 Other long term (current) drug therapy: Secondary | ICD-10-CM | POA: Insufficient documentation

## 2021-02-03 DIAGNOSIS — Z95828 Presence of other vascular implants and grafts: Secondary | ICD-10-CM

## 2021-02-03 LAB — CBC WITH DIFFERENTIAL (CANCER CENTER ONLY)
Abs Immature Granulocytes: 0.01 10*3/uL (ref 0.00–0.07)
Basophils Absolute: 0 10*3/uL (ref 0.0–0.1)
Basophils Relative: 1 %
Eosinophils Absolute: 0.3 10*3/uL (ref 0.0–0.5)
Eosinophils Relative: 5 %
HCT: 34 % — ABNORMAL LOW (ref 39.0–52.0)
Hemoglobin: 10.9 g/dL — ABNORMAL LOW (ref 13.0–17.0)
Immature Granulocytes: 0 %
Lymphocytes Relative: 19 %
Lymphs Abs: 1.2 10*3/uL (ref 0.7–4.0)
MCH: 29.7 pg (ref 26.0–34.0)
MCHC: 32.1 g/dL (ref 30.0–36.0)
MCV: 92.6 fL (ref 80.0–100.0)
Monocytes Absolute: 0.6 10*3/uL (ref 0.1–1.0)
Monocytes Relative: 10 %
Neutro Abs: 4 10*3/uL (ref 1.7–7.7)
Neutrophils Relative %: 65 %
Platelet Count: 151 10*3/uL (ref 150–400)
RBC: 3.67 MIL/uL — ABNORMAL LOW (ref 4.22–5.81)
RDW: 14.6 % (ref 11.5–15.5)
WBC Count: 6.1 10*3/uL (ref 4.0–10.5)
nRBC: 0 % (ref 0.0–0.2)

## 2021-02-03 LAB — CMP (CANCER CENTER ONLY)
ALT: 10 U/L (ref 0–44)
AST: 25 U/L (ref 15–41)
Albumin: 2.8 g/dL — ABNORMAL LOW (ref 3.5–5.0)
Alkaline Phosphatase: 127 U/L — ABNORMAL HIGH (ref 38–126)
Anion gap: 10 (ref 5–15)
BUN: 9 mg/dL (ref 8–23)
CO2: 28 mmol/L (ref 22–32)
Calcium: 9.8 mg/dL (ref 8.9–10.3)
Chloride: 103 mmol/L (ref 98–111)
Creatinine: 1.03 mg/dL (ref 0.61–1.24)
GFR, Estimated: >60 mL/min (ref >60)
Glucose, Bld: 138 mg/dL — ABNORMAL HIGH (ref 70–99)
Potassium: 3.5 mmol/L (ref 3.5–5.1)
Sodium: 141 mmol/L (ref 135–145)
Total Bilirubin: 0.4 mg/dL (ref 0.3–1.2)
Total Protein: 7 g/dL (ref 6.5–8.1)

## 2021-02-03 LAB — TSH: TSH: 0.574 u[IU]/mL (ref 0.320–4.118)

## 2021-02-03 MED ORDER — SODIUM CHLORIDE 0.9 % IV SOLN
Freq: Once | INTRAVENOUS | Status: AC
  Filled 2021-02-03: qty 250

## 2021-02-03 MED ORDER — PALONOSETRON HCL INJECTION 0.25 MG/5ML
INTRAVENOUS | Status: AC
  Filled 2021-02-03: qty 5

## 2021-02-03 MED ORDER — SODIUM CHLORIDE 0.9 % IV SOLN
412.5000 mg | Freq: Once | INTRAVENOUS | Status: AC
  Administered 2021-02-03: 410 mg via INTRAVENOUS
  Filled 2021-02-03: qty 41

## 2021-02-03 MED ORDER — SODIUM CHLORIDE 0.9 % IV SOLN
10.0000 mg | Freq: Once | INTRAVENOUS | Status: AC
  Administered 2021-02-03: 10 mg via INTRAVENOUS
  Filled 2021-02-03: qty 10

## 2021-02-03 MED ORDER — SODIUM CHLORIDE 0.9 % IV SOLN
500.0000 mg/m2 | Freq: Once | INTRAVENOUS | Status: AC
  Administered 2021-02-03: 900 mg via INTRAVENOUS
  Filled 2021-02-03: qty 20

## 2021-02-03 MED ORDER — SODIUM CHLORIDE 0.9 % IV SOLN
150.0000 mg | Freq: Once | INTRAVENOUS | Status: AC
  Administered 2021-02-03: 150 mg via INTRAVENOUS
  Filled 2021-02-03: qty 150

## 2021-02-03 MED ORDER — SODIUM CHLORIDE 0.9% FLUSH
10.0000 mL | INTRAVENOUS | Status: DC | PRN
  Filled 2021-02-03: qty 10

## 2021-02-03 MED ORDER — CYANOCOBALAMIN 1000 MCG/ML IJ SOLN
INTRAMUSCULAR | Status: AC
  Filled 2021-02-03: qty 1

## 2021-02-03 MED ORDER — SODIUM CHLORIDE 0.9 % IV SOLN
200.0000 mg | Freq: Once | INTRAVENOUS | Status: AC
  Administered 2021-02-03: 200 mg via INTRAVENOUS
  Filled 2021-02-03: qty 8

## 2021-02-03 MED ORDER — PALONOSETRON HCL INJECTION 0.25 MG/5ML
0.2500 mg | Freq: Once | INTRAVENOUS | Status: AC
  Administered 2021-02-03: 0.25 mg via INTRAVENOUS

## 2021-02-03 MED ORDER — CYANOCOBALAMIN 1000 MCG/ML IJ SOLN
1000.0000 ug | Freq: Once | INTRAMUSCULAR | Status: AC
  Administered 2021-02-03: 1000 ug via INTRAMUSCULAR

## 2021-02-03 NOTE — Patient Instructions (Signed)
[facility] ONCOLOGY  Discharge Instructions: Thank you for choosing [facility] to provide your oncology and hematology care.   If you have a lab appointment with the [facility], please go directly to the [facility] and check in at the registration area.   Wear comfortable clothing and clothing appropriate for easy access to any Portacath or PICC line.   We strive to give you quality time with your provider. You may need to reschedule your appointment if you arrive late (15 or more minutes).  Arriving late affects you and other patients whose appointments are after yours.  Also, if you miss three or more appointments without notifying the office, you may be dismissed from the clinic at the provider's discretion.      For prescription refill requests, have your pharmacy contact our office and allow 72 hours for refills to be completed.    Today you received the following chemotherapy and/or immunotherapy agents: Keytruda, Alimta, Carboplatin.   To help prevent nausea and vomiting after your treatment, we encourage you to take your nausea medication as directed.  BELOW ARE SYMPTOMS THAT SHOULD BE REPORTED IMMEDIATELY: . *FEVER GREATER THAN 100.4 F (38 C) OR HIGHER . *CHILLS OR SWEATING . *NAUSEA AND VOMITING THAT IS NOT CONTROLLED WITH YOUR NAUSEA MEDICATION . *UNUSUAL SHORTNESS OF BREATH . *UNUSUAL BRUISING OR BLEEDING . *URINARY PROBLEMS (pain or burning when urinating, or frequent urination) . *BOWEL PROBLEMS (unusual diarrhea, constipation, pain near the anus) . TENDERNESS IN MOUTH AND THROAT WITH OR WITHOUT PRESENCE OF ULCERS (sore throat, sores in mouth, or a toothache) . UNUSUAL RASH, SWELLING OR PAIN  . UNUSUAL VAGINAL DISCHARGE OR ITCHING   Items with * indicate a potential emergency and should be followed up as soon as possible or go to the Emergency Department if any problems should occur.  Please show the CHEMOTHERAPY ALERT CARD or  IMMUNOTHERAPY ALERT CARD at check-in to the Emergency Department and triage nurse.  Should you have questions after your visit or need to cancel or reschedule your appointment, please contact [facility]  Dept: [phone]  and follow the prompts.  Office hours are 8:00 a.m. to 4:30 p.m. Monday - Friday. Please note that voicemails left after 4:00 p.m. may not be returned until the following business day.  We are closed weekends and major holidays. You have access to a nurse at all times for urgent questions. Please call the main number to the clinic Dept: [phone] and follow the prompts.   For any non-urgent questions, you may also contact your provider using MyChart. We now offer e-Visits for anyone 5 and older to request care online for non-urgent symptoms. For details visit [email or web address].   Also download the MyChart app! Go to the app store, search "MyChart", open the app, select [facility], and log in with your MyChart username and password.  Due to Covid, a mask is required upon entering the hospital/clinic. If you do not have a mask, one will be given to you upon arrival. For doctor visits, patients may have 1 support person aged 46 or older with them. For treatment visits, patients cannot have anyone with them due to current Covid guidelines and our immunocompromised population.

## 2021-02-08 ENCOUNTER — Telehealth: Payer: Self-pay | Admitting: Physician Assistant

## 2021-02-08 DIAGNOSIS — E039 Hypothyroidism, unspecified: Secondary | ICD-10-CM | POA: Diagnosis not present

## 2021-02-08 DIAGNOSIS — C7951 Secondary malignant neoplasm of bone: Secondary | ICD-10-CM | POA: Diagnosis not present

## 2021-02-08 DIAGNOSIS — K219 Gastro-esophageal reflux disease without esophagitis: Secondary | ICD-10-CM | POA: Diagnosis not present

## 2021-02-08 DIAGNOSIS — C7931 Secondary malignant neoplasm of brain: Secondary | ICD-10-CM | POA: Diagnosis not present

## 2021-02-08 DIAGNOSIS — G3184 Mild cognitive impairment, so stated: Secondary | ICD-10-CM | POA: Diagnosis not present

## 2021-02-08 DIAGNOSIS — F32A Depression, unspecified: Secondary | ICD-10-CM | POA: Diagnosis not present

## 2021-02-08 DIAGNOSIS — I10 Essential (primary) hypertension: Secondary | ICD-10-CM | POA: Diagnosis not present

## 2021-02-08 DIAGNOSIS — E7800 Pure hypercholesterolemia, unspecified: Secondary | ICD-10-CM | POA: Diagnosis not present

## 2021-02-08 DIAGNOSIS — C3412 Malignant neoplasm of upper lobe, left bronchus or lung: Secondary | ICD-10-CM | POA: Diagnosis not present

## 2021-02-08 NOTE — Telephone Encounter (Signed)
Scheduled per los. Called and spoke with patients wife. Confirmed added appts

## 2021-02-09 ENCOUNTER — Telehealth: Payer: Self-pay | Admitting: Medical Oncology

## 2021-02-09 DIAGNOSIS — I10 Essential (primary) hypertension: Secondary | ICD-10-CM | POA: Diagnosis not present

## 2021-02-09 DIAGNOSIS — K219 Gastro-esophageal reflux disease without esophagitis: Secondary | ICD-10-CM | POA: Diagnosis not present

## 2021-02-09 DIAGNOSIS — E7800 Pure hypercholesterolemia, unspecified: Secondary | ICD-10-CM | POA: Diagnosis not present

## 2021-02-09 DIAGNOSIS — G3184 Mild cognitive impairment, so stated: Secondary | ICD-10-CM | POA: Diagnosis not present

## 2021-02-09 DIAGNOSIS — E039 Hypothyroidism, unspecified: Secondary | ICD-10-CM | POA: Diagnosis not present

## 2021-02-09 DIAGNOSIS — C7951 Secondary malignant neoplasm of bone: Secondary | ICD-10-CM | POA: Diagnosis not present

## 2021-02-09 DIAGNOSIS — F32A Depression, unspecified: Secondary | ICD-10-CM | POA: Diagnosis not present

## 2021-02-09 DIAGNOSIS — C3412 Malignant neoplasm of upper lobe, left bronchus or lung: Secondary | ICD-10-CM | POA: Diagnosis not present

## 2021-02-09 DIAGNOSIS — C7931 Secondary malignant neoplasm of brain: Secondary | ICD-10-CM | POA: Diagnosis not present

## 2021-02-09 NOTE — Telephone Encounter (Signed)
Wife said pt is cancelling his lab appt for tomorrow. Appt cancelled.

## 2021-02-10 ENCOUNTER — Other Ambulatory Visit: Payer: Medicare PPO

## 2021-02-16 DIAGNOSIS — F32A Depression, unspecified: Secondary | ICD-10-CM | POA: Diagnosis not present

## 2021-02-16 DIAGNOSIS — E039 Hypothyroidism, unspecified: Secondary | ICD-10-CM | POA: Diagnosis not present

## 2021-02-16 DIAGNOSIS — C7951 Secondary malignant neoplasm of bone: Secondary | ICD-10-CM | POA: Diagnosis not present

## 2021-02-16 DIAGNOSIS — C3412 Malignant neoplasm of upper lobe, left bronchus or lung: Secondary | ICD-10-CM | POA: Diagnosis not present

## 2021-02-16 DIAGNOSIS — K219 Gastro-esophageal reflux disease without esophagitis: Secondary | ICD-10-CM | POA: Diagnosis not present

## 2021-02-16 DIAGNOSIS — G3184 Mild cognitive impairment, so stated: Secondary | ICD-10-CM | POA: Diagnosis not present

## 2021-02-16 DIAGNOSIS — E7800 Pure hypercholesterolemia, unspecified: Secondary | ICD-10-CM | POA: Diagnosis not present

## 2021-02-16 DIAGNOSIS — I10 Essential (primary) hypertension: Secondary | ICD-10-CM | POA: Diagnosis not present

## 2021-02-16 DIAGNOSIS — C7931 Secondary malignant neoplasm of brain: Secondary | ICD-10-CM | POA: Diagnosis not present

## 2021-02-17 ENCOUNTER — Inpatient Hospital Stay: Payer: Medicare PPO

## 2021-02-17 ENCOUNTER — Telehealth: Payer: Self-pay | Admitting: Medical Oncology

## 2021-02-17 ENCOUNTER — Other Ambulatory Visit: Payer: Self-pay | Admitting: Internal Medicine

## 2021-02-17 NOTE — Telephone Encounter (Signed)
Pt canceled lab appt for today.

## 2021-02-22 DIAGNOSIS — E7800 Pure hypercholesterolemia, unspecified: Secondary | ICD-10-CM | POA: Diagnosis not present

## 2021-02-22 DIAGNOSIS — C3492 Malignant neoplasm of unspecified part of left bronchus or lung: Secondary | ICD-10-CM | POA: Diagnosis not present

## 2021-02-22 DIAGNOSIS — H6122 Impacted cerumen, left ear: Secondary | ICD-10-CM | POA: Diagnosis not present

## 2021-02-22 DIAGNOSIS — Z1389 Encounter for screening for other disorder: Secondary | ICD-10-CM | POA: Diagnosis not present

## 2021-02-22 DIAGNOSIS — Z0000 Encounter for general adult medical examination without abnormal findings: Secondary | ICD-10-CM | POA: Diagnosis not present

## 2021-02-22 DIAGNOSIS — I350 Nonrheumatic aortic (valve) stenosis: Secondary | ICD-10-CM | POA: Diagnosis not present

## 2021-02-23 ENCOUNTER — Other Ambulatory Visit: Payer: Self-pay | Admitting: Physician Assistant

## 2021-02-23 DIAGNOSIS — F32A Depression, unspecified: Secondary | ICD-10-CM | POA: Diagnosis not present

## 2021-02-23 DIAGNOSIS — K219 Gastro-esophageal reflux disease without esophagitis: Secondary | ICD-10-CM | POA: Diagnosis not present

## 2021-02-23 DIAGNOSIS — C7951 Secondary malignant neoplasm of bone: Secondary | ICD-10-CM | POA: Diagnosis not present

## 2021-02-23 DIAGNOSIS — E039 Hypothyroidism, unspecified: Secondary | ICD-10-CM | POA: Diagnosis not present

## 2021-02-23 DIAGNOSIS — C3412 Malignant neoplasm of upper lobe, left bronchus or lung: Secondary | ICD-10-CM | POA: Diagnosis not present

## 2021-02-23 DIAGNOSIS — C7931 Secondary malignant neoplasm of brain: Secondary | ICD-10-CM | POA: Diagnosis not present

## 2021-02-23 DIAGNOSIS — I10 Essential (primary) hypertension: Secondary | ICD-10-CM | POA: Diagnosis not present

## 2021-02-23 DIAGNOSIS — G3184 Mild cognitive impairment, so stated: Secondary | ICD-10-CM | POA: Diagnosis not present

## 2021-02-23 DIAGNOSIS — E7800 Pure hypercholesterolemia, unspecified: Secondary | ICD-10-CM | POA: Diagnosis not present

## 2021-02-24 ENCOUNTER — Other Ambulatory Visit: Payer: Medicare PPO

## 2021-02-24 ENCOUNTER — Other Ambulatory Visit: Payer: Self-pay | Admitting: Physician Assistant

## 2021-02-24 ENCOUNTER — Inpatient Hospital Stay: Payer: Medicare PPO | Attending: Internal Medicine

## 2021-02-24 ENCOUNTER — Inpatient Hospital Stay (HOSPITAL_BASED_OUTPATIENT_CLINIC_OR_DEPARTMENT_OTHER): Payer: Medicare PPO | Admitting: Internal Medicine

## 2021-02-24 ENCOUNTER — Other Ambulatory Visit: Payer: Self-pay

## 2021-02-24 ENCOUNTER — Encounter: Payer: Self-pay | Admitting: Internal Medicine

## 2021-02-24 ENCOUNTER — Inpatient Hospital Stay: Payer: Medicare PPO

## 2021-02-24 VITALS — BP 131/57 | HR 79 | Temp 97.7°F | Resp 18 | Ht 69.0 in | Wt 136.0 lb

## 2021-02-24 DIAGNOSIS — C7931 Secondary malignant neoplasm of brain: Secondary | ICD-10-CM | POA: Diagnosis not present

## 2021-02-24 DIAGNOSIS — Z79899 Other long term (current) drug therapy: Secondary | ICD-10-CM

## 2021-02-24 DIAGNOSIS — Z5111 Encounter for antineoplastic chemotherapy: Secondary | ICD-10-CM

## 2021-02-24 DIAGNOSIS — C3492 Malignant neoplasm of unspecified part of left bronchus or lung: Secondary | ICD-10-CM

## 2021-02-24 DIAGNOSIS — C7951 Secondary malignant neoplasm of bone: Secondary | ICD-10-CM | POA: Diagnosis not present

## 2021-02-24 DIAGNOSIS — Z87891 Personal history of nicotine dependence: Secondary | ICD-10-CM | POA: Diagnosis not present

## 2021-02-24 DIAGNOSIS — D702 Other drug-induced agranulocytosis: Secondary | ICD-10-CM | POA: Diagnosis not present

## 2021-02-24 DIAGNOSIS — F32A Depression, unspecified: Secondary | ICD-10-CM | POA: Diagnosis not present

## 2021-02-24 DIAGNOSIS — R5383 Other fatigue: Secondary | ICD-10-CM | POA: Diagnosis not present

## 2021-02-24 DIAGNOSIS — Z5112 Encounter for antineoplastic immunotherapy: Secondary | ICD-10-CM | POA: Diagnosis not present

## 2021-02-24 DIAGNOSIS — R059 Cough, unspecified: Secondary | ICD-10-CM | POA: Diagnosis not present

## 2021-02-24 DIAGNOSIS — Z5189 Encounter for other specified aftercare: Secondary | ICD-10-CM | POA: Diagnosis not present

## 2021-02-24 DIAGNOSIS — E876 Hypokalemia: Secondary | ICD-10-CM

## 2021-02-24 LAB — CBC WITH DIFFERENTIAL (CANCER CENTER ONLY)
Abs Immature Granulocytes: 0.01 10*3/uL (ref 0.00–0.07)
Basophils Absolute: 0 10*3/uL (ref 0.0–0.1)
Basophils Relative: 1 %
Eosinophils Absolute: 0.1 10*3/uL (ref 0.0–0.5)
Eosinophils Relative: 3 %
HCT: 33.4 % — ABNORMAL LOW (ref 39.0–52.0)
Hemoglobin: 10.6 g/dL — ABNORMAL LOW (ref 13.0–17.0)
Immature Granulocytes: 0 %
Lymphocytes Relative: 35 %
Lymphs Abs: 1 10*3/uL (ref 0.7–4.0)
MCH: 29 pg (ref 26.0–34.0)
MCHC: 31.7 g/dL (ref 30.0–36.0)
MCV: 91.5 fL (ref 80.0–100.0)
Monocytes Absolute: 0.6 10*3/uL (ref 0.1–1.0)
Monocytes Relative: 19 %
Neutro Abs: 1.3 10*3/uL — ABNORMAL LOW (ref 1.7–7.7)
Neutrophils Relative %: 42 %
Platelet Count: 250 10*3/uL (ref 150–400)
RBC: 3.65 MIL/uL — ABNORMAL LOW (ref 4.22–5.81)
RDW: 14.8 % (ref 11.5–15.5)
WBC Count: 3 10*3/uL — ABNORMAL LOW (ref 4.0–10.5)
nRBC: 0 % (ref 0.0–0.2)

## 2021-02-24 LAB — CMP (CANCER CENTER ONLY)
ALT: 12 U/L (ref 0–44)
AST: 21 U/L (ref 15–41)
Albumin: 2.9 g/dL — ABNORMAL LOW (ref 3.5–5.0)
Alkaline Phosphatase: 139 U/L — ABNORMAL HIGH (ref 38–126)
Anion gap: 13 (ref 5–15)
BUN: 7 mg/dL — ABNORMAL LOW (ref 8–23)
CO2: 24 mmol/L (ref 22–32)
Calcium: 9.7 mg/dL (ref 8.9–10.3)
Chloride: 104 mmol/L (ref 98–111)
Creatinine: 1.01 mg/dL (ref 0.61–1.24)
GFR, Estimated: >60 mL/min (ref >60)
Glucose, Bld: 160 mg/dL — ABNORMAL HIGH (ref 70–99)
Potassium: 3.2 mmol/L — ABNORMAL LOW (ref 3.5–5.1)
Sodium: 141 mmol/L (ref 135–145)
Total Bilirubin: 0.3 mg/dL (ref 0.3–1.2)
Total Protein: 7.2 g/dL (ref 6.5–8.1)

## 2021-02-24 LAB — TSH: TSH: 0.889 u[IU]/mL (ref 0.320–4.118)

## 2021-02-24 MED ORDER — SODIUM CHLORIDE 0.9 % IV SOLN
410.0000 mg | Freq: Once | INTRAVENOUS | Status: AC
  Administered 2021-02-24: 410 mg via INTRAVENOUS
  Filled 2021-02-24: qty 41

## 2021-02-24 MED ORDER — PALONOSETRON HCL INJECTION 0.25 MG/5ML
INTRAVENOUS | Status: AC
  Filled 2021-02-24: qty 5

## 2021-02-24 MED ORDER — SODIUM CHLORIDE 0.9 % IV SOLN
200.0000 mg | Freq: Once | INTRAVENOUS | Status: AC
  Administered 2021-02-24: 200 mg via INTRAVENOUS
  Filled 2021-02-24: qty 8

## 2021-02-24 MED ORDER — CYANOCOBALAMIN 1000 MCG/ML IJ SOLN
INTRAMUSCULAR | Status: AC
  Filled 2021-02-24: qty 1

## 2021-02-24 MED ORDER — PALONOSETRON HCL INJECTION 0.25 MG/5ML
0.2500 mg | Freq: Once | INTRAVENOUS | Status: AC
  Administered 2021-02-24: 0.25 mg via INTRAVENOUS

## 2021-02-24 MED ORDER — SODIUM CHLORIDE 0.9 % IV SOLN
Freq: Once | INTRAVENOUS | Status: AC
  Filled 2021-02-24: qty 250

## 2021-02-24 MED ORDER — SODIUM CHLORIDE 0.9 % IV SOLN
150.0000 mg | Freq: Once | INTRAVENOUS | Status: AC
  Administered 2021-02-24: 150 mg via INTRAVENOUS
  Filled 2021-02-24: qty 150

## 2021-02-24 MED ORDER — SODIUM CHLORIDE 0.9 % IV SOLN
500.0000 mg/m2 | Freq: Once | INTRAVENOUS | Status: AC
  Administered 2021-02-24: 900 mg via INTRAVENOUS
  Filled 2021-02-24: qty 16

## 2021-02-24 MED ORDER — CYANOCOBALAMIN 1000 MCG/ML IJ SOLN: 1000.0000 ug | Freq: Once | INTRAMUSCULAR | Status: DC

## 2021-02-24 MED ORDER — POTASSIUM CHLORIDE CRYS ER 20 MEQ PO TBCR: 20.0000 meq | EXTENDED_RELEASE_TABLET | Freq: Every day | ORAL | 0 refills | Status: DC

## 2021-02-24 MED ORDER — DEXAMETHASONE SODIUM PHOSPHATE 100 MG/10ML IJ SOLN
10.0000 mg | Freq: Once | INTRAMUSCULAR | Status: AC
  Administered 2021-02-24: 10 mg via INTRAVENOUS
  Filled 2021-02-24: qty 10

## 2021-02-24 NOTE — Progress Notes (Signed)
[facility] Telephone:([phone]) [phone]   Fax:(336) [phone]  OFFICE PROGRESS NOTE  Seward Carol, MD [address] [place] [address]  DIAGNOSIS: Stage IV (T4, N3, M1c) non-small cell lung cancer, adenocarcinoma presented with left upper lobe lung mass in addition to bilateral mediastinal lymphadenopathy as well as bilateral pulmonary nodules and metastatic disease to the bones and brain diagnosed in July 2021.  Molecular studies by [employer] showed no actionable mutations.  Biomarker Findings by foundation 1. Microsatellite status - MS-Stable Tumor Mutational Burden - 5 Muts/Mb Genomic Findings For a complete list of the genes assayed, please refer to the Appendix. KEAP1 V452f*25 STK11 E256* CDKN2A/B p15INK4b R82* 8 Disease relevant genes with no repor  PDL1 expression: 1%  PRIOR THERAPY: None  CURRENT THERAPY: Systemic chemotherapy with carboplatin for AUC of 5, Alimta 500 mg/M2 and Keytruda 200 mg IV every 3 weeks.  First dose 04/06/2020.  Status post 14 cycles.  Starting from cycle #5 he is on maintenance treatment with Alimta and Keytruda every 3 weeks.  Starting cycle #14, I will add carboplatin again for AUC of 5 for the next 4 cycles.  INTERVAL HISTORY: Phillip SEVILLANO80y.o. male returns to the clinic today for follow-up visit accompanied by his wife.  The patient is feeling fine today with no concerning complaints except for the fatigue.  He also has some weakness in the lower extremities.  He tolerated the last cycle of his treatment well.  He has no current nausea, vomiting, diarrhea or constipation.  He denied having any headache or visual changes.  He has no fever or chills.  He has no chest pain but has shortness of breath with exertion with mild cough and no hemoptysis.  [name] here today for evaluation before starting cycle #15.  MEDICAL HISTORY: Past Medical History:  Diagnosis Date  . Depression   . GERD (gastroesophageal  reflux disease)    occasional, diet controlled  . High cholesterol   . Hypertension    no longer taking medication - states it's undercontrol  . Hypothyroid     ALLERGIES:  is allergic to codeine, hydrocodone, oxycodone, and penicillins.  MEDICATIONS:  Current Outpatient Medications  Medication Sig Dispense Refill  . acetaminophen (TYLENOL) 325 MG tablet Take 325-650 mg by mouth every 6 (six) hours as needed for moderate pain or headache.    . benzonatate (TESSALON) 200 MG capsule Take 1 capsule (200 mg total) by mouth 4 (four) times daily as needed for cough. 60 capsule 1  . chlorhexidine (PERIDEX) 0.12 % solution RINSE WITH 15ML THREE TIMES DAILY FOR MOUTH RINSE FOR 1 MINUTE THEN EXPECTORATE    . citalopram (CELEXA) 20 MG tablet Take 1 tablet (20 mg total) by mouth daily. 30 tablet 3  . dronabinol (MARINOL) 2.5 MG capsule TAKE 1 CAPSULE BY MOUTH TWICE DAILY BEFORE MEAL(S) 60 capsule 0  . esomeprazole (NEXIUM) 20 MG capsule Take 20 mg by mouth daily as needed (acid reflux).     . folic acid (FOLVITE) 1 MG tablet Take 1 tablet by mouth once daily 30 tablet 0  . levothyroxine (SYNTHROID) 88 MCG tablet Take 88 mcg by mouth daily before breakfast.    . LORazepam (ATIVAN) 0.5 MG tablet Take 1-2 tablets by mouth 30 minutes before procedures, PRN anxiety. (Patient not taking: Reported on 02/03/2021) 8 tablet 0  . prochlorperazine (COMPAZINE) 10 MG tablet TAKE 1 TABLET BY MOUTH EVERY 6 HOURS AS NEEDED FOR  NAUSEA OR VOMITING (Patient not taking: Reported on 02/03/2021) 30 tablet 0  . tamsulosin (FLOMAX) 0.4 MG CAPS capsule Take 0.4 mg by mouth every evening.      No current facility-administered medications for this visit.    SURGICAL HISTORY:  Past Surgical History:  Procedure Laterality Date  . BRONCHIAL NEEDLE ASPIRATION BIOPSY  03/24/2020   Procedure: BRONCHIAL NEEDLE ASPIRATION BIOPSIES;  Surgeon: Candee Furbish, MD;  Location: [facility] ENDOSCOPY;  Service: Pulmonary;;  . HEMORRHOID SURGERY     . VIDEO BRONCHOSCOPY WITH ENDOBRONCHIAL ULTRASOUND N/A 03/24/2020   Procedure: VIDEO BRONCHOSCOPY WITH ENDOBRONCHIAL ULTRASOUND;  Surgeon: Candee Furbish, MD;  Location: [facility] ENDOSCOPY;  Service: Pulmonary;  Laterality: N/A;    REVIEW OF SYSTEMS:  A comprehensive review of systems was negative except for: Constitutional: positive for fatigue Respiratory: positive for dyspnea on exertion Musculoskeletal: positive for muscle weakness   PHYSICAL EXAMINATION: General appearance: alert, cooperative, fatigued and no distress Head: Normocephalic, without obvious abnormality, atraumatic Neck: no adenopathy, no JVD, supple, symmetrical, trachea midline and thyroid not enlarged, symmetric, no tenderness/mass/nodules Lymph nodes: Cervical, supraclavicular, and axillary nodes normal. Resp: clear to auscultation bilaterally Back: symmetric, no curvature. ROM normal. No CVA tenderness. Cardio: regular rate and rhythm, S1, S2 normal, no murmur, click, rub or gallop GI: soft, non-tender; bowel sounds normal; no masses,  no organomegaly Extremities: extremities normal, atraumatic, no cyanosis or edema  ECOG PERFORMANCE STATUS: 1 - Symptomatic but completely ambulatory  Blood pressure (!) 131/57, pulse 79, temperature 97.7 F (36.5 C), temperature source Tympanic, resp. rate 18, height '5\' 9"'  (1.753 m), weight 136 lb (61.7 kg), SpO2 99 %.  LABORATORY DATA: Lab Results  Component Value Date   WBC 3.0 (L) 02/24/2021   HGB 10.6 (L) 02/24/2021   HCT 33.4 (L) 02/24/2021   MCV 91.5 02/24/2021   PLT 250 02/24/2021      Chemistry      Component Value Date/Time   NA 141 02/24/2021 1006   K 3.2 (L) 02/24/2021 1006   CL 104 02/24/2021 1006   CO2 24 02/24/2021 1006   BUN 7 (L) 02/24/2021 1006   CREATININE 1.01 02/24/2021 1006      Component Value Date/Time   CALCIUM 9.7 02/24/2021 1006   ALKPHOS 139 (H) 02/24/2021 1006   AST 21 02/24/2021 1006   ALT 12 02/24/2021 1006   BILITOT 0.3 02/24/2021 1006        RADIOGRAPHIC STUDIES: No results found.  ASSESSMENT AND PLAN: This is a very pleasant 80 years old white male recently diagnosed with a stage IV (T4, N3, M1C) non-small cell lung cancer, adenocarcinoma presented with left upper lobe lung mass in addition to bilateral pulmonary nodules as well as bilateral hilar and mediastinal lymphadenopathy and metastatic disease to the bones and brain diagnosed in July 2021. He underwent stereotactic radiotherapy to the metastatic brain lesion under the care of Dr. Isidore Moos. Unfortunately the molecular studies by [employer] was negative for actionable mutations.  The initial tissue biopsy was insufficient for molecular studies by foundation 1. The patient had repeat biopsy for the molecular studies and the expectation is to have the result on 05/05/2020. He started systemic chemotherapy with carboplatin for AUC of 5, Alimta 500 mg/M2 and Keytruda 200 mg IV every 3 weeks status post 13 cycles.  Starting from cycle #5 he is on maintenance treatment with Alimta and Keytruda.  Unfortunately his scan after cycle #13 showed interval enlargement of the left upper lobe lung mass with extension into  the apical chest wall and some increase on the small pulmonary nodules suggestive of disease progression.  After discussion with the patient we changed his systemic chemotherapy to carboplatin for AUC of 5 in addition to his current maintenance treatment with Alimta and Keytruda starting from cycle #14 and this will be done for total of 4 cycles then maintenance treatment again if no evidence for disease progression. The patient tolerated cycle #14 fairly well with no concerning complaints except for the fatigue. I recommended for him to proceed with cycle #15 today as planned. I will see him back for follow-up visit in 3 weeks for evaluation before starting cycle #16 and we will repeat imaging studies after the next cycle of his treatment. For the depression he is currently  on Cymbalta. The patient was advised to call immediately if he has any concerning symptoms in the interval.   Disclaimer: This note was dictated with voice recognition software. Similar sounding words can inadvertently be transcribed and may not be corrected upon review.

## 2021-02-24 NOTE — Patient Instructions (Signed)
[facility] ONCOLOGY  Discharge Instructions: Thank you for choosing [facility] to provide your oncology and hematology care.   If you have a lab appointment with the [facility], please go directly to the [facility] and check in at the registration area.   Wear comfortable clothing and clothing appropriate for easy access to any Portacath or PICC line.   We strive to give you quality time with your provider. You may need to reschedule your appointment if you arrive late (15 or more minutes).  Arriving late affects you and other patients whose appointments are after yours.  Also, if you miss three or more appointments without notifying the office, you may be dismissed from the clinic at the provider's discretion.      For prescription refill requests, have your pharmacy contact our office and allow 72 hours for refills to be completed.    Today you received the following chemotherapy and/or immunotherapy agents keytruda; alimta; carboplatin   To help prevent nausea and vomiting after your treatment, we encourage you to take your nausea medication as directed.  BELOW ARE SYMPTOMS THAT SHOULD BE REPORTED IMMEDIATELY: . *FEVER GREATER THAN 100.4 F (38 C) OR HIGHER . *CHILLS OR SWEATING . *NAUSEA AND VOMITING THAT IS NOT CONTROLLED WITH YOUR NAUSEA MEDICATION . *UNUSUAL SHORTNESS OF BREATH . *UNUSUAL BRUISING OR BLEEDING . *URINARY PROBLEMS (pain or burning when urinating, or frequent urination) . *BOWEL PROBLEMS (unusual diarrhea, constipation, pain near the anus) . TENDERNESS IN MOUTH AND THROAT WITH OR WITHOUT PRESENCE OF ULCERS (sore throat, sores in mouth, or a toothache) . UNUSUAL RASH, SWELLING OR PAIN  . UNUSUAL VAGINAL DISCHARGE OR ITCHING   Items with * indicate a potential emergency and should be followed up as soon as possible or go to the Emergency Department if any problems should occur.  Please show the CHEMOTHERAPY ALERT CARD or  IMMUNOTHERAPY ALERT CARD at check-in to the Emergency Department and triage nurse.  Should you have questions after your visit or need to cancel or reschedule your appointment, please contact [facility]  Dept: [phone]  and follow the prompts.  Office hours are 8:00 a.m. to 4:30 p.m. Monday - Friday. Please note that voicemails left after 4:00 p.m. may not be returned until the following business day.  We are closed weekends and major holidays. You have access to a nurse at all times for urgent questions. Please call the main number to the clinic Dept: [phone] and follow the prompts.   For any non-urgent questions, you may also contact your provider using MyChart. We now offer e-Visits for anyone 47 and older to request care online for non-urgent symptoms. For details visit [email or web address].   Also download the MyChart app! Go to the app store, search "MyChart", open the app, select [facility], and log in with your MyChart username and password.  Due to Covid, a mask is required upon entering the hospital/clinic. If you do not have a mask, one will be given to you upon arrival. For doctor visits, patients may have 1 support person aged 45 or older with them. For treatment visits, patients cannot have anyone with them due to current Covid guidelines and our immunocompromised population.

## 2021-02-24 NOTE — Progress Notes (Signed)
Per Dr. [name] OK to treat with today's labs 

## 2021-03-01 ENCOUNTER — Inpatient Hospital Stay: Payer: Medicare PPO | Admitting: Licensed Clinical Social Worker

## 2021-03-01 ENCOUNTER — Other Ambulatory Visit: Payer: Self-pay

## 2021-03-01 DIAGNOSIS — K219 Gastro-esophageal reflux disease without esophagitis: Secondary | ICD-10-CM | POA: Diagnosis not present

## 2021-03-01 DIAGNOSIS — F32A Depression, unspecified: Secondary | ICD-10-CM | POA: Diagnosis not present

## 2021-03-01 DIAGNOSIS — I10 Essential (primary) hypertension: Secondary | ICD-10-CM | POA: Diagnosis not present

## 2021-03-01 DIAGNOSIS — C7951 Secondary malignant neoplasm of bone: Secondary | ICD-10-CM | POA: Diagnosis not present

## 2021-03-01 DIAGNOSIS — E7800 Pure hypercholesterolemia, unspecified: Secondary | ICD-10-CM | POA: Diagnosis not present

## 2021-03-01 DIAGNOSIS — C7931 Secondary malignant neoplasm of brain: Secondary | ICD-10-CM | POA: Diagnosis not present

## 2021-03-01 DIAGNOSIS — G3184 Mild cognitive impairment, so stated: Secondary | ICD-10-CM | POA: Diagnosis not present

## 2021-03-01 DIAGNOSIS — C3492 Malignant neoplasm of unspecified part of left bronchus or lung: Secondary | ICD-10-CM

## 2021-03-01 DIAGNOSIS — E039 Hypothyroidism, unspecified: Secondary | ICD-10-CM | POA: Diagnosis not present

## 2021-03-01 DIAGNOSIS — C3412 Malignant neoplasm of upper lobe, left bronchus or lung: Secondary | ICD-10-CM | POA: Diagnosis not present

## 2021-03-01 NOTE — Progress Notes (Signed)
[facility]  Clinical Social Worker and chaplain met with patient's wife in support services.  Wife brought completed Advanced directives documents and MOST forms, but had questions related to specific medical procedures that are detailed on the forms. CSW and chaplain explained need to speak with medical providers regarding those questions and how to clarify what each concept means.  Wife expressed understanding and will set up an appointment with pt's PCP who helped with MOST form originally.   Clinical Social Worker encouraged patient/family to contact with any additional questions or concerns.    [place] [phone]

## 2021-03-03 ENCOUNTER — Inpatient Hospital Stay: Payer: Medicare PPO

## 2021-03-03 ENCOUNTER — Other Ambulatory Visit: Payer: Self-pay | Admitting: Physician Assistant

## 2021-03-03 ENCOUNTER — Other Ambulatory Visit: Payer: Self-pay

## 2021-03-03 DIAGNOSIS — Z79899 Other long term (current) drug therapy: Secondary | ICD-10-CM | POA: Diagnosis not present

## 2021-03-03 DIAGNOSIS — Z5112 Encounter for antineoplastic immunotherapy: Secondary | ICD-10-CM | POA: Diagnosis not present

## 2021-03-03 DIAGNOSIS — Z5111 Encounter for antineoplastic chemotherapy: Secondary | ICD-10-CM | POA: Diagnosis not present

## 2021-03-03 DIAGNOSIS — C7951 Secondary malignant neoplasm of bone: Secondary | ICD-10-CM | POA: Diagnosis not present

## 2021-03-03 DIAGNOSIS — C3492 Malignant neoplasm of unspecified part of left bronchus or lung: Secondary | ICD-10-CM

## 2021-03-03 DIAGNOSIS — Z5189 Encounter for other specified aftercare: Secondary | ICD-10-CM | POA: Diagnosis not present

## 2021-03-03 DIAGNOSIS — R5383 Other fatigue: Secondary | ICD-10-CM | POA: Diagnosis not present

## 2021-03-03 DIAGNOSIS — R059 Cough, unspecified: Secondary | ICD-10-CM | POA: Diagnosis not present

## 2021-03-03 DIAGNOSIS — C7931 Secondary malignant neoplasm of brain: Secondary | ICD-10-CM | POA: Diagnosis not present

## 2021-03-03 LAB — CMP (CANCER CENTER ONLY)
ALT: 15 U/L (ref 0–44)
AST: 24 U/L (ref 15–41)
Albumin: 3 g/dL — ABNORMAL LOW (ref 3.5–5.0)
Alkaline Phosphatase: 133 U/L — ABNORMAL HIGH (ref 38–126)
Anion gap: 12 (ref 5–15)
BUN: 11 mg/dL (ref 8–23)
CO2: 25 mmol/L (ref 22–32)
Calcium: 9.6 mg/dL (ref 8.9–10.3)
Chloride: 101 mmol/L (ref 98–111)
Creatinine: 0.91 mg/dL (ref 0.61–1.24)
GFR, Estimated: >60 mL/min (ref >60)
Glucose, Bld: 135 mg/dL — ABNORMAL HIGH (ref 70–99)
Potassium: 3.7 mmol/L (ref 3.5–5.1)
Sodium: 138 mmol/L (ref 135–145)
Total Bilirubin: 0.5 mg/dL (ref 0.3–1.2)
Total Protein: 7.2 g/dL (ref 6.5–8.1)

## 2021-03-03 LAB — CBC WITH DIFFERENTIAL (CANCER CENTER ONLY)
Abs Immature Granulocytes: 0.01 10*3/uL (ref 0.00–0.07)
Basophils Absolute: 0 10*3/uL (ref 0.0–0.1)
Basophils Relative: 1 %
Eosinophils Absolute: 0 10*3/uL (ref 0.0–0.5)
Eosinophils Relative: 1 %
HCT: 32.6 % — ABNORMAL LOW (ref 39.0–52.0)
Hemoglobin: 10.5 g/dL — ABNORMAL LOW (ref 13.0–17.0)
Immature Granulocytes: 1 %
Lymphocytes Relative: 56 %
Lymphs Abs: 0.7 10*3/uL (ref 0.7–4.0)
MCH: 28.9 pg (ref 26.0–34.0)
MCHC: 32.2 g/dL (ref 30.0–36.0)
MCV: 89.8 fL (ref 80.0–100.0)
Monocytes Absolute: 0.1 10*3/uL (ref 0.1–1.0)
Monocytes Relative: 7 %
Neutro Abs: 0.4 10*3/uL — CL (ref 1.7–7.7)
Neutrophils Relative %: 34 %
Platelet Count: 133 10*3/uL — ABNORMAL LOW (ref 150–400)
RBC: 3.63 MIL/uL — ABNORMAL LOW (ref 4.22–5.81)
RDW: 14.6 % (ref 11.5–15.5)
WBC Count: 1.2 10*3/uL — ABNORMAL LOW (ref 4.0–10.5)
nRBC: 0 % (ref 0.0–0.2)

## 2021-03-03 NOTE — Progress Notes (Signed)
Orders placed for Zarxio. Plan on 3 daily injections due to chemo induced neutropenia. RN will call and review neutropenic precautions. Will schedule once PA authorization has been obtained.

## 2021-03-04 ENCOUNTER — Other Ambulatory Visit: Payer: Self-pay | Admitting: Internal Medicine

## 2021-03-04 ENCOUNTER — Telehealth: Payer: Self-pay | Admitting: Medical Oncology

## 2021-03-04 ENCOUNTER — Other Ambulatory Visit: Payer: Self-pay

## 2021-03-04 ENCOUNTER — Inpatient Hospital Stay: Payer: Medicare PPO

## 2021-03-04 VITALS — BP 118/50 | HR 85 | Temp 98.5°F | Resp 18

## 2021-03-04 DIAGNOSIS — Z5112 Encounter for antineoplastic immunotherapy: Secondary | ICD-10-CM | POA: Diagnosis not present

## 2021-03-04 DIAGNOSIS — C7951 Secondary malignant neoplasm of bone: Secondary | ICD-10-CM | POA: Diagnosis not present

## 2021-03-04 DIAGNOSIS — R059 Cough, unspecified: Secondary | ICD-10-CM | POA: Diagnosis not present

## 2021-03-04 DIAGNOSIS — C3492 Malignant neoplasm of unspecified part of left bronchus or lung: Secondary | ICD-10-CM | POA: Diagnosis not present

## 2021-03-04 DIAGNOSIS — Z79899 Other long term (current) drug therapy: Secondary | ICD-10-CM | POA: Diagnosis not present

## 2021-03-04 DIAGNOSIS — C7931 Secondary malignant neoplasm of brain: Secondary | ICD-10-CM | POA: Diagnosis not present

## 2021-03-04 DIAGNOSIS — D702 Other drug-induced agranulocytosis: Secondary | ICD-10-CM

## 2021-03-04 DIAGNOSIS — R5383 Other fatigue: Secondary | ICD-10-CM | POA: Diagnosis not present

## 2021-03-04 DIAGNOSIS — Z5111 Encounter for antineoplastic chemotherapy: Secondary | ICD-10-CM | POA: Diagnosis not present

## 2021-03-04 DIAGNOSIS — Z5189 Encounter for other specified aftercare: Secondary | ICD-10-CM | POA: Diagnosis not present

## 2021-03-04 MED ORDER — FILGRASTIM-SNDZ 300 MCG/0.5ML IJ SOSY
PREFILLED_SYRINGE | INTRAMUSCULAR | Status: AC
  Filled 2021-03-04: qty 0.5

## 2021-03-04 MED ORDER — FILGRASTIM-SNDZ 300 MCG/0.5ML IJ SOSY
300.0000 ug | PREFILLED_SYRINGE | Freq: Once | INTRAMUSCULAR | Status: AC
  Administered 2021-03-04: 300 ug via SUBCUTANEOUS

## 2021-03-04 NOTE — Telephone Encounter (Signed)
Appts given to [facility] for pt Filgrastin G-CSF. She stated " I"ll do my best to get him there. What is latest he can come today ?" I told her 1530.

## 2021-03-04 NOTE — Progress Notes (Signed)
Pt says he has been experiencing constipation, this nurse asked if he would like me to contact a doctor to see if they can prescribe anything and pt stated no.

## 2021-03-05 ENCOUNTER — Inpatient Hospital Stay: Payer: Medicare PPO

## 2021-03-05 VITALS — BP 104/50 | HR 73 | Temp 98.4°F | Resp 16

## 2021-03-05 DIAGNOSIS — Z5189 Encounter for other specified aftercare: Secondary | ICD-10-CM | POA: Diagnosis not present

## 2021-03-05 DIAGNOSIS — D702 Other drug-induced agranulocytosis: Secondary | ICD-10-CM

## 2021-03-05 DIAGNOSIS — R059 Cough, unspecified: Secondary | ICD-10-CM | POA: Diagnosis not present

## 2021-03-05 DIAGNOSIS — R5383 Other fatigue: Secondary | ICD-10-CM | POA: Diagnosis not present

## 2021-03-05 DIAGNOSIS — Z5112 Encounter for antineoplastic immunotherapy: Secondary | ICD-10-CM | POA: Diagnosis not present

## 2021-03-05 DIAGNOSIS — C7951 Secondary malignant neoplasm of bone: Secondary | ICD-10-CM | POA: Diagnosis not present

## 2021-03-05 DIAGNOSIS — C3492 Malignant neoplasm of unspecified part of left bronchus or lung: Secondary | ICD-10-CM | POA: Diagnosis not present

## 2021-03-05 DIAGNOSIS — Z79899 Other long term (current) drug therapy: Secondary | ICD-10-CM | POA: Diagnosis not present

## 2021-03-05 DIAGNOSIS — C7931 Secondary malignant neoplasm of brain: Secondary | ICD-10-CM | POA: Diagnosis not present

## 2021-03-05 DIAGNOSIS — Z5111 Encounter for antineoplastic chemotherapy: Secondary | ICD-10-CM | POA: Diagnosis not present

## 2021-03-05 MED ORDER — FILGRASTIM-SNDZ 300 MCG/0.5ML IJ SOSY
300.0000 ug | PREFILLED_SYRINGE | Freq: Once | INTRAMUSCULAR | Status: AC
  Administered 2021-03-05: 300 ug via SUBCUTANEOUS

## 2021-03-05 MED ORDER — FILGRASTIM-SNDZ 300 MCG/0.5ML IJ SOSY
PREFILLED_SYRINGE | INTRAMUSCULAR | Status: AC
  Filled 2021-03-05: qty 0.5

## 2021-03-06 ENCOUNTER — Other Ambulatory Visit: Payer: Self-pay

## 2021-03-06 ENCOUNTER — Inpatient Hospital Stay: Payer: Medicare PPO

## 2021-03-06 VITALS — BP 128/59 | HR 89 | Temp 97.7°F | Resp 19

## 2021-03-06 DIAGNOSIS — C7951 Secondary malignant neoplasm of bone: Secondary | ICD-10-CM | POA: Diagnosis not present

## 2021-03-06 DIAGNOSIS — Z5112 Encounter for antineoplastic immunotherapy: Secondary | ICD-10-CM | POA: Diagnosis not present

## 2021-03-06 DIAGNOSIS — R059 Cough, unspecified: Secondary | ICD-10-CM | POA: Diagnosis not present

## 2021-03-06 DIAGNOSIS — Z79899 Other long term (current) drug therapy: Secondary | ICD-10-CM | POA: Diagnosis not present

## 2021-03-06 DIAGNOSIS — C7931 Secondary malignant neoplasm of brain: Secondary | ICD-10-CM | POA: Diagnosis not present

## 2021-03-06 DIAGNOSIS — R5383 Other fatigue: Secondary | ICD-10-CM | POA: Diagnosis not present

## 2021-03-06 DIAGNOSIS — Z5111 Encounter for antineoplastic chemotherapy: Secondary | ICD-10-CM | POA: Diagnosis not present

## 2021-03-06 DIAGNOSIS — Z5189 Encounter for other specified aftercare: Secondary | ICD-10-CM | POA: Diagnosis not present

## 2021-03-06 DIAGNOSIS — D702 Other drug-induced agranulocytosis: Secondary | ICD-10-CM

## 2021-03-06 DIAGNOSIS — C3492 Malignant neoplasm of unspecified part of left bronchus or lung: Secondary | ICD-10-CM | POA: Diagnosis not present

## 2021-03-06 MED ORDER — FILGRASTIM-SNDZ 300 MCG/0.5ML IJ SOSY
300.0000 ug | PREFILLED_SYRINGE | Freq: Once | INTRAMUSCULAR | Status: AC
  Administered 2021-03-06: 300 ug via SUBCUTANEOUS

## 2021-03-10 ENCOUNTER — Inpatient Hospital Stay: Payer: Medicare PPO

## 2021-03-10 ENCOUNTER — Other Ambulatory Visit: Payer: Self-pay

## 2021-03-10 DIAGNOSIS — Z5111 Encounter for antineoplastic chemotherapy: Secondary | ICD-10-CM | POA: Diagnosis not present

## 2021-03-10 DIAGNOSIS — C3492 Malignant neoplasm of unspecified part of left bronchus or lung: Secondary | ICD-10-CM

## 2021-03-10 DIAGNOSIS — C7931 Secondary malignant neoplasm of brain: Secondary | ICD-10-CM | POA: Diagnosis not present

## 2021-03-10 DIAGNOSIS — R059 Cough, unspecified: Secondary | ICD-10-CM | POA: Diagnosis not present

## 2021-03-10 DIAGNOSIS — C7951 Secondary malignant neoplasm of bone: Secondary | ICD-10-CM | POA: Diagnosis not present

## 2021-03-10 DIAGNOSIS — R5383 Other fatigue: Secondary | ICD-10-CM | POA: Diagnosis not present

## 2021-03-10 DIAGNOSIS — Z5112 Encounter for antineoplastic immunotherapy: Secondary | ICD-10-CM | POA: Diagnosis not present

## 2021-03-10 DIAGNOSIS — Z5189 Encounter for other specified aftercare: Secondary | ICD-10-CM | POA: Diagnosis not present

## 2021-03-10 DIAGNOSIS — Z79899 Other long term (current) drug therapy: Secondary | ICD-10-CM | POA: Diagnosis not present

## 2021-03-10 LAB — CBC WITH DIFFERENTIAL (CANCER CENTER ONLY)
Abs Immature Granulocytes: 0.52 10*3/uL — ABNORMAL HIGH (ref 0.00–0.07)
Basophils Absolute: 0 10*3/uL (ref 0.0–0.1)
Basophils Relative: 1 %
Eosinophils Absolute: 0.1 10*3/uL (ref 0.0–0.5)
Eosinophils Relative: 1 %
HCT: 29.8 % — ABNORMAL LOW (ref 39.0–52.0)
Hemoglobin: 9.7 g/dL — ABNORMAL LOW (ref 13.0–17.0)
Immature Granulocytes: 10 %
Lymphocytes Relative: 24 %
Lymphs Abs: 1.3 10*3/uL (ref 0.7–4.0)
MCH: 29.3 pg (ref 26.0–34.0)
MCHC: 32.6 g/dL (ref 30.0–36.0)
MCV: 90 fL (ref 80.0–100.0)
Monocytes Absolute: 1 10*3/uL (ref 0.1–1.0)
Monocytes Relative: 19 %
Neutro Abs: 2.4 10*3/uL (ref 1.7–7.7)
Neutrophils Relative %: 45 %
Platelet Count: 52 10*3/uL — ABNORMAL LOW (ref 150–400)
RBC: 3.31 MIL/uL — ABNORMAL LOW (ref 4.22–5.81)
RDW: 15.2 % (ref 11.5–15.5)
WBC Count: 5.3 10*3/uL (ref 4.0–10.5)
nRBC: 0 % (ref 0.0–0.2)

## 2021-03-10 LAB — CMP (CANCER CENTER ONLY)
ALT: 19 U/L (ref 0–44)
AST: 21 U/L (ref 15–41)
Albumin: 2.8 g/dL — ABNORMAL LOW (ref 3.5–5.0)
Alkaline Phosphatase: 139 U/L — ABNORMAL HIGH (ref 38–126)
Anion gap: 11 (ref 5–15)
BUN: 4 mg/dL — ABNORMAL LOW (ref 8–23)
CO2: 27 mmol/L (ref 22–32)
Calcium: 9.3 mg/dL (ref 8.9–10.3)
Chloride: 103 mmol/L (ref 98–111)
Creatinine: 0.92 mg/dL (ref 0.61–1.24)
GFR, Estimated: >60 mL/min (ref >60)
Glucose, Bld: 104 mg/dL — ABNORMAL HIGH (ref 70–99)
Potassium: 3.7 mmol/L (ref 3.5–5.1)
Sodium: 141 mmol/L (ref 135–145)
Total Bilirubin: 0.3 mg/dL (ref 0.3–1.2)
Total Protein: 6.7 g/dL (ref 6.5–8.1)

## 2021-03-16 ENCOUNTER — Other Ambulatory Visit: Payer: Self-pay | Admitting: Physician Assistant

## 2021-03-16 DIAGNOSIS — R634 Abnormal weight loss: Secondary | ICD-10-CM

## 2021-03-17 ENCOUNTER — Telehealth: Payer: Self-pay | Admitting: Internal Medicine

## 2021-03-17 ENCOUNTER — Inpatient Hospital Stay (HOSPITAL_BASED_OUTPATIENT_CLINIC_OR_DEPARTMENT_OTHER): Payer: Medicare PPO | Admitting: Internal Medicine

## 2021-03-17 ENCOUNTER — Inpatient Hospital Stay: Payer: Medicare PPO

## 2021-03-17 ENCOUNTER — Other Ambulatory Visit: Payer: Self-pay

## 2021-03-17 ENCOUNTER — Inpatient Hospital Stay: Payer: Medicare PPO | Admitting: Nutrition

## 2021-03-17 ENCOUNTER — Encounter: Payer: Self-pay | Admitting: Internal Medicine

## 2021-03-17 VITALS — BP 117/53 | HR 78 | Temp 97.8°F | Resp 18 | Ht 69.0 in | Wt 131.6 lb

## 2021-03-17 DIAGNOSIS — Z87891 Personal history of nicotine dependence: Secondary | ICD-10-CM

## 2021-03-17 DIAGNOSIS — C7931 Secondary malignant neoplasm of brain: Secondary | ICD-10-CM | POA: Diagnosis not present

## 2021-03-17 DIAGNOSIS — Z79899 Other long term (current) drug therapy: Secondary | ICD-10-CM

## 2021-03-17 DIAGNOSIS — Z5112 Encounter for antineoplastic immunotherapy: Secondary | ICD-10-CM | POA: Diagnosis not present

## 2021-03-17 DIAGNOSIS — C3492 Malignant neoplasm of unspecified part of left bronchus or lung: Secondary | ICD-10-CM | POA: Diagnosis not present

## 2021-03-17 DIAGNOSIS — F32A Depression, unspecified: Secondary | ICD-10-CM

## 2021-03-17 DIAGNOSIS — C3490 Malignant neoplasm of unspecified part of unspecified bronchus or lung: Secondary | ICD-10-CM

## 2021-03-17 DIAGNOSIS — K5900 Constipation, unspecified: Secondary | ICD-10-CM | POA: Diagnosis not present

## 2021-03-17 DIAGNOSIS — Z5111 Encounter for antineoplastic chemotherapy: Secondary | ICD-10-CM | POA: Diagnosis not present

## 2021-03-17 DIAGNOSIS — C7951 Secondary malignant neoplasm of bone: Secondary | ICD-10-CM

## 2021-03-17 DIAGNOSIS — Z5189 Encounter for other specified aftercare: Secondary | ICD-10-CM | POA: Diagnosis not present

## 2021-03-17 DIAGNOSIS — R5383 Other fatigue: Secondary | ICD-10-CM | POA: Diagnosis not present

## 2021-03-17 DIAGNOSIS — R059 Cough, unspecified: Secondary | ICD-10-CM | POA: Diagnosis not present

## 2021-03-17 LAB — CMP (CANCER CENTER ONLY)
ALT: 10 U/L (ref 0–44)
AST: 19 U/L (ref 15–41)
Albumin: 2.7 g/dL — ABNORMAL LOW (ref 3.5–5.0)
Alkaline Phosphatase: 138 U/L — ABNORMAL HIGH (ref 38–126)
Anion gap: 9 (ref 5–15)
BUN: 7 mg/dL — ABNORMAL LOW (ref 8–23)
CO2: 26 mmol/L (ref 22–32)
Calcium: 9.4 mg/dL (ref 8.9–10.3)
Chloride: 102 mmol/L (ref 98–111)
Creatinine: 0.96 mg/dL (ref 0.61–1.24)
GFR, Estimated: >60 mL/min (ref >60)
Glucose, Bld: 152 mg/dL — ABNORMAL HIGH (ref 70–99)
Potassium: 3.9 mmol/L (ref 3.5–5.1)
Sodium: 137 mmol/L (ref 135–145)
Total Bilirubin: 0.3 mg/dL (ref 0.3–1.2)
Total Protein: 7.2 g/dL (ref 6.5–8.1)

## 2021-03-17 LAB — CBC WITH DIFFERENTIAL (CANCER CENTER ONLY)
Abs Immature Granulocytes: 0.03 10*3/uL (ref 0.00–0.07)
Basophils Absolute: 0.1 10*3/uL (ref 0.0–0.1)
Basophils Relative: 1 %
Eosinophils Absolute: 0.1 10*3/uL (ref 0.0–0.5)
Eosinophils Relative: 2 %
HCT: 32.3 % — ABNORMAL LOW (ref 39.0–52.0)
Hemoglobin: 10.2 g/dL — ABNORMAL LOW (ref 13.0–17.0)
Immature Granulocytes: 1 %
Lymphocytes Relative: 24 %
Lymphs Abs: 1.3 10*3/uL (ref 0.7–4.0)
MCH: 28.9 pg (ref 26.0–34.0)
MCHC: 31.6 g/dL (ref 30.0–36.0)
MCV: 91.5 fL (ref 80.0–100.0)
Monocytes Absolute: 0.5 10*3/uL (ref 0.1–1.0)
Monocytes Relative: 10 %
Neutro Abs: 3.4 10*3/uL (ref 1.7–7.7)
Neutrophils Relative %: 62 %
Platelet Count: 205 10*3/uL (ref 150–400)
RBC: 3.53 MIL/uL — ABNORMAL LOW (ref 4.22–5.81)
RDW: 16.1 % — ABNORMAL HIGH (ref 11.5–15.5)
WBC Count: 5.4 10*3/uL (ref 4.0–10.5)
nRBC: 0 % (ref 0.0–0.2)

## 2021-03-17 LAB — TSH: TSH: 0.471 u[IU]/mL (ref 0.320–4.118)

## 2021-03-17 MED ORDER — PALONOSETRON HCL INJECTION 0.25 MG/5ML
INTRAVENOUS | Status: AC
  Filled 2021-03-17: qty 5

## 2021-03-17 MED ORDER — FAMOTIDINE 20 MG IN NS 100 ML IVPB
INTRAVENOUS | Status: AC
  Filled 2021-03-17: qty 100

## 2021-03-17 MED ORDER — DIPHENHYDRAMINE HCL 50 MG/ML IJ SOLN
25.0000 mg | Freq: Once | INTRAMUSCULAR | Status: AC
Start: 2021-03-17 — End: 2021-03-17
  Administered 2021-03-17: 25 mg via INTRAVENOUS

## 2021-03-17 MED ORDER — DIPHENHYDRAMINE HCL 50 MG/ML IJ SOLN
INTRAMUSCULAR | Status: AC
  Filled 2021-03-17: qty 1

## 2021-03-17 MED ORDER — SODIUM CHLORIDE 0.9 % IV SOLN
Freq: Once | INTRAVENOUS | Status: AC
  Filled 2021-03-17: qty 250

## 2021-03-17 MED ORDER — SODIUM CHLORIDE 0.9 % IV SOLN
370.0000 mg | Freq: Once | INTRAVENOUS | Status: AC
  Administered 2021-03-17: 370 mg via INTRAVENOUS
  Filled 2021-03-17: qty 37

## 2021-03-17 MED ORDER — SODIUM CHLORIDE 0.9 % IV SOLN
500.0000 mg/m2 | Freq: Once | INTRAVENOUS | Status: AC
  Administered 2021-03-17: 900 mg via INTRAVENOUS
  Filled 2021-03-17: qty 20

## 2021-03-17 MED ORDER — FAMOTIDINE 20 MG IN NS 100 ML IVPB
20.0000 mg | Freq: Once | INTRAVENOUS | Status: AC
Start: 2021-03-17 — End: 2021-03-17
  Administered 2021-03-17: 20 mg via INTRAVENOUS

## 2021-03-17 MED ORDER — PALONOSETRON HCL INJECTION 0.25 MG/5ML
0.2500 mg | Freq: Once | INTRAVENOUS | Status: AC
  Administered 2021-03-17: 0.25 mg via INTRAVENOUS

## 2021-03-17 MED ORDER — SODIUM CHLORIDE 0.9 % IV SOLN
200.0000 mg | Freq: Once | INTRAVENOUS | Status: AC
  Administered 2021-03-17: 200 mg via INTRAVENOUS
  Filled 2021-03-17: qty 8

## 2021-03-17 MED ORDER — SODIUM CHLORIDE 0.9 % IV SOLN
10.0000 mg | Freq: Once | INTRAVENOUS | Status: AC
  Administered 2021-03-17: 10 mg via INTRAVENOUS
  Filled 2021-03-17: qty 10

## 2021-03-17 MED ORDER — SODIUM CHLORIDE 0.9 % IV SOLN
150.0000 mg | Freq: Once | INTRAVENOUS | Status: AC
  Administered 2021-03-17: 150 mg via INTRAVENOUS
  Filled 2021-03-17: qty 150

## 2021-03-17 NOTE — Progress Notes (Signed)
[name] Telephone:([phone]   Fax:([phone]) [phone]  OFFICE PROGRESS NOTE  Seward Carol, MD [address] [place] [address]  DIAGNOSIS: Stage IV (T4, N3, M1c) non-small cell lung cancer, adenocarcinoma presented with left upper lobe lung mass in addition to bilateral mediastinal lymphadenopathy as well as bilateral pulmonary nodules and metastatic disease to the bones and brain diagnosed in July 2021.  Molecular studies by [employer] showed no actionable mutations.  Biomarker Findings by foundation 1. Microsatellite status - MS-Stable Tumor Mutational Burden - 5 Muts/Mb Genomic Findings For a complete list of the genes assayed, please refer to the Appendix. KEAP1 V426f*25 STK11 E256* CDKN2A/B p15INK4b R82* 8 Disease relevant genes with no repor  PDL1 expression: 1%  PRIOR THERAPY: None  CURRENT THERAPY: Systemic chemotherapy with carboplatin for AUC of 5, Alimta 500 mg/M2 and Keytruda 200 mg IV every 3 weeks.  First dose 04/06/2020.  Status post 15 cycles.  Starting from cycle #5 he is on maintenance treatment with Alimta and Keytruda every 3 weeks.  Starting cycle #14, I will add carboplatin again for AUC of 5 for the next 4 cycles.  INTERVAL HISTORY: Phillip KEISLER80y.o. male returns to the clinic today for follow-up visit accompanied by his wife [name]  The patient is doing fine with no concerning complaints except for fatigue.  He also has few episodes of constipation and he tried milk of magnesia and MiraLAX and finally had a bowel movement after few days.  He denied having any current chest pain, shortness of breath, cough or hemoptysis.  He denied having any fever or chills.  He has no nausea, vomiting, abdominal pain or diarrhea.  He tolerated the last cycle of his systemic chemotherapy fairly well.   MEDICAL HISTORY: Past Medical History:  Diagnosis Date   Depression    GERD (gastroesophageal reflux disease)     occasional, diet controlled   High cholesterol    Hypertension    no longer taking medication - states it's undercontrol   Hypothyroid     ALLERGIES:  is allergic to codeine, hydrocodone, oxycodone, and penicillins.  MEDICATIONS:  Current Outpatient Medications  Medication Sig Dispense Refill   acetaminophen (TYLENOL) 325 MG tablet Take 325-650 mg by mouth every 6 (six) hours as needed for moderate pain or headache.     benzonatate (TESSALON) 200 MG capsule Take 1 capsule (200 mg total) by mouth 4 (four) times daily as needed for cough. 60 capsule 1   chlorhexidine (PERIDEX) 0.12 % solution RINSE WITH 15ML THREE TIMES DAILY FOR MOUTH RINSE FOR 1 MINUTE THEN EXPECTORATE     citalopram (CELEXA) 20 MG tablet Take 1 tablet (20 mg total) by mouth daily. 30 tablet 3   dronabinol (MARINOL) 2.5 MG capsule TAKE 1 CAPSULE BY MOUTH TWICE DAILY BEFORE MEAL(S) 60 capsule 0   esomeprazole (NEXIUM) 20 MG capsule Take 20 mg by mouth daily as needed (acid reflux).      folic acid (FOLVITE) 1 MG tablet Take 1 tablet by mouth once daily 30 tablet 0   levothyroxine (SYNTHROID) 88 MCG tablet Take 88 mcg by mouth daily before breakfast.     LORazepam (ATIVAN) 0.5 MG tablet Take 1-2 tablets by mouth 30 minutes before procedures, PRN anxiety. (Patient not taking: Reported on 02/03/2021) 8 tablet 0   potassium chloride SA (KLOR-CON) 20 MEQ tablet Take 1 tablet (20 mEq total) by mouth daily. 7 tablet 0   prochlorperazine (COMPAZINE)  10 MG tablet TAKE 1 TABLET BY MOUTH EVERY 6 HOURS AS NEEDED FOR NAUSEA OR VOMITING 30 tablet 0   tamsulosin (FLOMAX) 0.4 MG CAPS capsule Take 0.4 mg by mouth every evening.      No current facility-administered medications for this visit.    SURGICAL HISTORY:  Past Surgical History:  Procedure Laterality Date   BRONCHIAL NEEDLE ASPIRATION BIOPSY  03/24/2020   Procedure: BRONCHIAL NEEDLE ASPIRATION BIOPSIES;  Surgeon: Candee Furbish, MD;  Location: [facility] ENDOSCOPY;  Service: Pulmonary;;    HEMORRHOID SURGERY     VIDEO BRONCHOSCOPY WITH ENDOBRONCHIAL ULTRASOUND N/A 03/24/2020   Procedure: VIDEO BRONCHOSCOPY WITH ENDOBRONCHIAL ULTRASOUND;  Surgeon: Candee Furbish, MD;  Location: [facility] ENDOSCOPY;  Service: Pulmonary;  Laterality: N/A;    REVIEW OF SYSTEMS:  A comprehensive review of systems was negative except for: Constitutional: positive for fatigue Gastrointestinal: positive for constipation Musculoskeletal: positive for muscle weakness   PHYSICAL EXAMINATION: General appearance: alert, cooperative, fatigued, and no distress Head: Normocephalic, without obvious abnormality, atraumatic Neck: no adenopathy, no JVD, supple, symmetrical, trachea midline, and thyroid not enlarged, symmetric, no tenderness/mass/nodules Lymph nodes: Cervical, supraclavicular, and axillary nodes normal. Resp: clear to auscultation bilaterally Back: symmetric, no curvature. ROM normal. No CVA tenderness. Cardio: regular rate and rhythm, S1, S2 normal, no murmur, click, rub or gallop GI: soft, non-tender; bowel sounds normal; no masses,  no organomegaly Extremities: extremities normal, atraumatic, no cyanosis or edema  ECOG PERFORMANCE STATUS: 1 - Symptomatic but completely ambulatory  Blood pressure (!) 117/53, pulse 78, temperature 97.8 F (36.6 C), temperature source Tympanic, resp. rate 18, height '5\' 9"'  (1.753 m), weight 131 lb 9.6 oz (59.7 kg), SpO2 99 %.  LABORATORY DATA: Lab Results  Component Value Date   WBC 5.4 03/17/2021   HGB 10.2 (L) 03/17/2021   HCT 32.3 (L) 03/17/2021   MCV 91.5 03/17/2021   PLT 205 03/17/2021      Chemistry      Component Value Date/Time   NA 141 03/10/2021 1517   K 3.7 03/10/2021 1517   CL 103 03/10/2021 1517   CO2 27 03/10/2021 1517   BUN 4 (L) 03/10/2021 1517   CREATININE 0.92 03/10/2021 1517      Component Value Date/Time   CALCIUM 9.3 03/10/2021 1517   ALKPHOS 139 (H) 03/10/2021 1517   AST 21 03/10/2021 1517   ALT 19 03/10/2021 1517   BILITOT  0.3 03/10/2021 1517       RADIOGRAPHIC STUDIES: No results found.  ASSESSMENT AND PLAN: This is a very pleasant 80 years old white male recently diagnosed with a stage IV (T4, N3, M1C) non-small cell lung cancer, adenocarcinoma presented with left upper lobe lung mass in addition to bilateral pulmonary nodules as well as bilateral hilar and mediastinal lymphadenopathy and metastatic disease to the bones and brain diagnosed in July 2021. He underwent stereotactic radiotherapy to the metastatic brain lesion under the care of Dr. Isidore Moos. Unfortunately the molecular studies by [employer] was negative for actionable mutations.  The initial tissue biopsy was insufficient for molecular studies by foundation 1. The patient had repeat biopsy for the molecular studies and the expectation is to have the result on 05/05/2020. He started systemic chemotherapy with carboplatin for AUC of 5, Alimta 500 mg/M2 and Keytruda 200 mg IV every 3 weeks status post 15 cycles.  Starting from cycle #5 he is on maintenance treatment with Alimta and Keytruda.  Unfortunately his scan after cycle #13 showed interval enlargement of the left upper  lobe lung mass with extension into the apical chest wall and some increase on the small pulmonary nodules suggestive of disease progression.  After discussion with the patient we changed his systemic chemotherapy to carboplatin for AUC of 5 in addition to his current maintenance treatment with Alimta and Keytruda starting from cycle #14 and this will be done for total of 4 cycles then maintenance treatment again if no evidence for disease progression. The patient tolerated the last cycle of his treatment well with no concerning adverse effects. I recommended for him to proceed with cycle #16 today as planned. I will see him back for follow-up visit in 3 weeks for evaluation with repeat CT scan of the chest, abdomen pelvis for restaging of his disease. For the depression he is currently  on Cymbalta. For the constipation, the patient was advised to continue on MiraLAX and also milk of magnesia as needed. He was advised to call immediately if he has any concerning symptoms in the interval.  Disclaimer: This note was dictated with voice recognition software. Similar sounding words can inadvertently be transcribed and may not be corrected upon review.

## 2021-03-17 NOTE — Progress Notes (Signed)
Nutrition follow-up completed with patient and wife. Weight decreased and documented as 131.6 pounds on June 8.  This is decreased from 160 pounds approximately 1 year ago.  This is an 18% weight loss over 1 year which is significant.  Noted patient has increased fatigue.  He has had some nausea after chemotherapy but this is improved with Compazine.  Wife reports long episode of constipation.  She was told today to use milk of magnesia by MD.  Patient's wife states patient will only eat what he wants to eat and is resistant to suggestions.  He continues to drink lemonade and Tang.  He will eat cereal with milk.  He refuses all nutrition supplements. Nutrition focused physical exam was deferred.  Nutrition diagnosis: Unintended weight loss continues.  Intervention: Provided support and encouragement. Provided nutrition fact sheet on fiber and bowel management. Continue increased fluids. Provided samples of protein powder and a juice based nutrition drink which wife will take with her and present to patient.  Monitoring, evaluation, goals: Patient will work to increase calories and protein to minimize weight loss to tolerate treatment.  No follow-up scheduled.  They know to contact me with further questions or concerns.  **Disclaimer: This note was dictated with voice recognition software. Similar sounding words can inadvertently be transcribed and this note may contain transcription errors which may not have been corrected upon publication of note.**

## 2021-03-17 NOTE — Progress Notes (Signed)
Ok to adj Carbo to 370 mg today per [facility].  Kennith Center, Pharm.D., CPP 03/17/2021@2 :58 PM

## 2021-03-17 NOTE — Patient Instructions (Signed)
[facility] ONCOLOGY  Discharge Instructions: Thank you for choosing [facility] to provide your oncology and hematology care.   If you have a lab appointment with the [facility], please go directly to the [facility] and check in at the registration area.   Wear comfortable clothing and clothing appropriate for easy access to any Portacath or PICC line.   We strive to give you quality time with your provider. You may need to reschedule your appointment if you arrive late (15 or more minutes).  Arriving late affects you and other patients whose appointments are after yours.  Also, if you miss three or more appointments without notifying the office, you may be dismissed from the clinic at the provider's discretion.      For prescription refill requests, have your pharmacy contact our office and allow 72 hours for refills to be completed.    Today you received the following chemotherapy and/or immunotherapy agents : Keytruda, Alimta, Carboplatin     To help prevent nausea and vomiting after your treatment, we encourage you to take your nausea medication as directed.  BELOW ARE SYMPTOMS THAT SHOULD BE REPORTED IMMEDIATELY: *FEVER GREATER THAN 100.4 F (38 C) OR HIGHER *CHILLS OR SWEATING *NAUSEA AND VOMITING THAT IS NOT CONTROLLED WITH YOUR NAUSEA MEDICATION *UNUSUAL SHORTNESS OF BREATH *UNUSUAL BRUISING OR BLEEDING *URINARY PROBLEMS (pain or burning when urinating, or frequent urination) *BOWEL PROBLEMS (unusual diarrhea, constipation, pain near the anus) TENDERNESS IN MOUTH AND THROAT WITH OR WITHOUT PRESENCE OF ULCERS (sore throat, sores in mouth, or a toothache) UNUSUAL RASH, SWELLING OR PAIN  UNUSUAL VAGINAL DISCHARGE OR ITCHING   Items with * indicate a potential emergency and should be followed up as soon as possible or go to the Emergency Department if any problems should occur.  Please show the CHEMOTHERAPY ALERT CARD or IMMUNOTHERAPY ALERT  CARD at check-in to the Emergency Department and triage nurse.  Should you have questions after your visit or need to cancel or reschedule your appointment, please contact [facility]  Dept: [phone]  and follow the prompts.  Office hours are 8:00 a.m. to 4:30 p.m. Monday - Friday. Please note that voicemails left after 4:00 p.m. may not be returned until the following business day.  We are closed weekends and major holidays. You have access to a nurse at all times for urgent questions. Please call the main number to the clinic Dept: [phone] and follow the prompts.   For any non-urgent questions, you may also contact your provider using MyChart. We now offer e-Visits for anyone 27 and older to request care online for non-urgent symptoms. For details visit [email or web address].   Also download the MyChart app! Go to the app store, search "MyChart", open the app, select [facility], and log in with your MyChart username and password.  Due to Covid, a mask is required upon entering the hospital/clinic. If you do not have a mask, one will be given to you upon arrival. For doctor visits, patients may have 1 support person aged 65 or older with them. For treatment visits, patients cannot have anyone with them due to current Covid guidelines and our immunocompromised population.

## 2021-03-17 NOTE — Telephone Encounter (Signed)
Scheduled per [facility]. Gave avs and calendar  

## 2021-03-18 ENCOUNTER — Other Ambulatory Visit: Payer: Self-pay | Admitting: Physician Assistant

## 2021-03-24 ENCOUNTER — Other Ambulatory Visit: Payer: Self-pay

## 2021-03-24 ENCOUNTER — Inpatient Hospital Stay: Payer: Medicare PPO | Attending: Internal Medicine

## 2021-03-24 DIAGNOSIS — R059 Cough, unspecified: Secondary | ICD-10-CM | POA: Insufficient documentation

## 2021-03-24 DIAGNOSIS — F32A Depression, unspecified: Secondary | ICD-10-CM | POA: Insufficient documentation

## 2021-03-24 DIAGNOSIS — R5383 Other fatigue: Secondary | ICD-10-CM | POA: Diagnosis not present

## 2021-03-24 DIAGNOSIS — Z87891 Personal history of nicotine dependence: Secondary | ICD-10-CM | POA: Diagnosis not present

## 2021-03-24 DIAGNOSIS — C3492 Malignant neoplasm of unspecified part of left bronchus or lung: Secondary | ICD-10-CM | POA: Insufficient documentation

## 2021-03-24 DIAGNOSIS — Z79899 Other long term (current) drug therapy: Secondary | ICD-10-CM | POA: Diagnosis not present

## 2021-03-24 DIAGNOSIS — C7951 Secondary malignant neoplasm of bone: Secondary | ICD-10-CM | POA: Insufficient documentation

## 2021-03-24 DIAGNOSIS — C7931 Secondary malignant neoplasm of brain: Secondary | ICD-10-CM | POA: Diagnosis not present

## 2021-03-24 LAB — CBC WITH DIFFERENTIAL (CANCER CENTER ONLY)
Abs Immature Granulocytes: 0.01 10*3/uL (ref 0.00–0.07)
Basophils Absolute: 0 10*3/uL (ref 0.0–0.1)
Basophils Relative: 1 %
Eosinophils Absolute: 0 10*3/uL (ref 0.0–0.5)
Eosinophils Relative: 1 %
HCT: 28.8 % — ABNORMAL LOW (ref 39.0–52.0)
Hemoglobin: 9.3 g/dL — ABNORMAL LOW (ref 13.0–17.0)
Immature Granulocytes: 1 %
Lymphocytes Relative: 45 %
Lymphs Abs: 0.9 10*3/uL (ref 0.7–4.0)
MCH: 29.1 pg (ref 26.0–34.0)
MCHC: 32.3 g/dL (ref 30.0–36.0)
MCV: 90 fL (ref 80.0–100.0)
Monocytes Absolute: 0.1 10*3/uL (ref 0.1–1.0)
Monocytes Relative: 4 %
Neutro Abs: 1 10*3/uL — ABNORMAL LOW (ref 1.7–7.7)
Neutrophils Relative %: 48 %
Platelet Count: 138 10*3/uL — ABNORMAL LOW (ref 150–400)
RBC: 3.2 MIL/uL — ABNORMAL LOW (ref 4.22–5.81)
RDW: 15.8 % — ABNORMAL HIGH (ref 11.5–15.5)
WBC Count: 2.1 10*3/uL — ABNORMAL LOW (ref 4.0–10.5)
nRBC: 0 % (ref 0.0–0.2)

## 2021-03-24 LAB — CMP (CANCER CENTER ONLY)
ALT: 13 U/L (ref 0–44)
AST: 20 U/L (ref 15–41)
Albumin: 2.8 g/dL — ABNORMAL LOW (ref 3.5–5.0)
Alkaline Phosphatase: 124 U/L (ref 38–126)
Anion gap: 7 (ref 5–15)
BUN: 11 mg/dL (ref 8–23)
CO2: 29 mmol/L (ref 22–32)
Calcium: 9.6 mg/dL (ref 8.9–10.3)
Chloride: 103 mmol/L (ref 98–111)
Creatinine: 0.86 mg/dL (ref 0.61–1.24)
GFR, Estimated: >60 mL/min (ref >60)
Glucose, Bld: 111 mg/dL — ABNORMAL HIGH (ref 70–99)
Potassium: 3.5 mmol/L (ref 3.5–5.1)
Sodium: 139 mmol/L (ref 135–145)
Total Bilirubin: 0.4 mg/dL (ref 0.3–1.2)
Total Protein: 7 g/dL (ref 6.5–8.1)

## 2021-03-31 ENCOUNTER — Other Ambulatory Visit: Payer: Self-pay

## 2021-03-31 ENCOUNTER — Inpatient Hospital Stay: Payer: Medicare PPO

## 2021-03-31 DIAGNOSIS — R5383 Other fatigue: Secondary | ICD-10-CM | POA: Diagnosis not present

## 2021-03-31 DIAGNOSIS — Z87891 Personal history of nicotine dependence: Secondary | ICD-10-CM | POA: Diagnosis not present

## 2021-03-31 DIAGNOSIS — R059 Cough, unspecified: Secondary | ICD-10-CM | POA: Diagnosis not present

## 2021-03-31 DIAGNOSIS — C3492 Malignant neoplasm of unspecified part of left bronchus or lung: Secondary | ICD-10-CM

## 2021-03-31 DIAGNOSIS — F32A Depression, unspecified: Secondary | ICD-10-CM | POA: Diagnosis not present

## 2021-03-31 DIAGNOSIS — Z79899 Other long term (current) drug therapy: Secondary | ICD-10-CM | POA: Diagnosis not present

## 2021-03-31 DIAGNOSIS — C7951 Secondary malignant neoplasm of bone: Secondary | ICD-10-CM | POA: Diagnosis not present

## 2021-03-31 DIAGNOSIS — C7931 Secondary malignant neoplasm of brain: Secondary | ICD-10-CM | POA: Diagnosis not present

## 2021-03-31 LAB — CBC WITH DIFFERENTIAL (CANCER CENTER ONLY)
Abs Immature Granulocytes: 0 10*3/uL (ref 0.00–0.07)
Basophils Absolute: 0 10*3/uL (ref 0.0–0.1)
Basophils Relative: 0 %
Eosinophils Absolute: 0.1 10*3/uL (ref 0.0–0.5)
Eosinophils Relative: 2 %
HCT: 28.8 % — ABNORMAL LOW (ref 39.0–52.0)
Hemoglobin: 9 g/dL — ABNORMAL LOW (ref 13.0–17.0)
Immature Granulocytes: 0 %
Lymphocytes Relative: 31 %
Lymphs Abs: 0.9 10*3/uL (ref 0.7–4.0)
MCH: 28.8 pg (ref 26.0–34.0)
MCHC: 31.3 g/dL (ref 30.0–36.0)
MCV: 92 fL (ref 80.0–100.0)
Monocytes Absolute: 0.6 10*3/uL (ref 0.1–1.0)
Monocytes Relative: 19 %
Neutro Abs: 1.5 10*3/uL — ABNORMAL LOW (ref 1.7–7.7)
Neutrophils Relative %: 48 %
Platelet Count: 58 10*3/uL — ABNORMAL LOW (ref 150–400)
RBC: 3.13 MIL/uL — ABNORMAL LOW (ref 4.22–5.81)
RDW: 17.5 % — ABNORMAL HIGH (ref 11.5–15.5)
WBC Count: 3 10*3/uL — ABNORMAL LOW (ref 4.0–10.5)
nRBC: 0 % (ref 0.0–0.2)

## 2021-03-31 LAB — CMP (CANCER CENTER ONLY)
ALT: 13 U/L (ref 0–44)
AST: 19 U/L (ref 15–41)
Albumin: 2.7 g/dL — ABNORMAL LOW (ref 3.5–5.0)
Alkaline Phosphatase: 134 U/L — ABNORMAL HIGH (ref 38–126)
Anion gap: 7 (ref 5–15)
BUN: 5 mg/dL — ABNORMAL LOW (ref 8–23)
CO2: 29 mmol/L (ref 22–32)
Calcium: 9.4 mg/dL (ref 8.9–10.3)
Chloride: 104 mmol/L (ref 98–111)
Creatinine: 0.9 mg/dL (ref 0.61–1.24)
GFR, Estimated: >60 mL/min (ref >60)
Glucose, Bld: 120 mg/dL — ABNORMAL HIGH (ref 70–99)
Potassium: 3.6 mmol/L (ref 3.5–5.1)
Sodium: 140 mmol/L (ref 135–145)
Total Bilirubin: 0.4 mg/dL (ref 0.3–1.2)
Total Protein: 7 g/dL (ref 6.5–8.1)

## 2021-04-05 ENCOUNTER — Encounter (HOSPITAL_COMMUNITY): Payer: Self-pay

## 2021-04-05 ENCOUNTER — Telehealth: Payer: Self-pay | Admitting: Medical Oncology

## 2021-04-05 ENCOUNTER — Ambulatory Visit (HOSPITAL_COMMUNITY): Payer: Medicare PPO

## 2021-04-05 NOTE — Telephone Encounter (Signed)
Phillip Franklin is adamant that he is not going to take a CT scan or r/s it.  "It is torture to me and I want one good night sleep"  He will keep appt on wed to discuss with Dr Julien Nordmann.

## 2021-04-07 ENCOUNTER — Other Ambulatory Visit: Payer: Self-pay

## 2021-04-07 ENCOUNTER — Inpatient Hospital Stay: Payer: Medicare PPO | Admitting: Internal Medicine

## 2021-04-07 ENCOUNTER — Inpatient Hospital Stay: Payer: Medicare PPO

## 2021-04-07 ENCOUNTER — Telehealth: Payer: Self-pay | Admitting: Internal Medicine

## 2021-04-07 VITALS — BP 128/57 | HR 78 | Temp 97.8°F | Resp 18 | Ht 69.0 in | Wt 131.4 lb

## 2021-04-07 DIAGNOSIS — C7951 Secondary malignant neoplasm of bone: Secondary | ICD-10-CM | POA: Diagnosis not present

## 2021-04-07 DIAGNOSIS — C3492 Malignant neoplasm of unspecified part of left bronchus or lung: Secondary | ICD-10-CM | POA: Diagnosis not present

## 2021-04-07 DIAGNOSIS — C7931 Secondary malignant neoplasm of brain: Secondary | ICD-10-CM

## 2021-04-07 DIAGNOSIS — Z87891 Personal history of nicotine dependence: Secondary | ICD-10-CM | POA: Diagnosis not present

## 2021-04-07 DIAGNOSIS — Z79899 Other long term (current) drug therapy: Secondary | ICD-10-CM | POA: Diagnosis not present

## 2021-04-07 DIAGNOSIS — Z5112 Encounter for antineoplastic immunotherapy: Secondary | ICD-10-CM

## 2021-04-07 DIAGNOSIS — F32A Depression, unspecified: Secondary | ICD-10-CM | POA: Diagnosis not present

## 2021-04-07 DIAGNOSIS — R059 Cough, unspecified: Secondary | ICD-10-CM | POA: Diagnosis not present

## 2021-04-07 DIAGNOSIS — R5383 Other fatigue: Secondary | ICD-10-CM | POA: Diagnosis not present

## 2021-04-07 DIAGNOSIS — Z5111 Encounter for antineoplastic chemotherapy: Secondary | ICD-10-CM

## 2021-04-07 LAB — CBC WITH DIFFERENTIAL (CANCER CENTER ONLY)
Abs Immature Granulocytes: 0.01 10*3/uL (ref 0.00–0.07)
Basophils Absolute: 0 10*3/uL (ref 0.0–0.1)
Basophils Relative: 1 %
Eosinophils Absolute: 0.1 10*3/uL (ref 0.0–0.5)
Eosinophils Relative: 2 %
HCT: 30.1 % — ABNORMAL LOW (ref 39.0–52.0)
Hemoglobin: 9.6 g/dL — ABNORMAL LOW (ref 13.0–17.0)
Immature Granulocytes: 0 %
Lymphocytes Relative: 30 %
Lymphs Abs: 1.1 10*3/uL (ref 0.7–4.0)
MCH: 29.1 pg (ref 26.0–34.0)
MCHC: 31.9 g/dL (ref 30.0–36.0)
MCV: 91.2 fL (ref 80.0–100.0)
Monocytes Absolute: 0.8 10*3/uL (ref 0.1–1.0)
Monocytes Relative: 20 %
Neutro Abs: 1.8 10*3/uL (ref 1.7–7.7)
Neutrophils Relative %: 47 %
Platelet Count: 181 10*3/uL (ref 150–400)
RBC: 3.3 MIL/uL — ABNORMAL LOW (ref 4.22–5.81)
RDW: 18.5 % — ABNORMAL HIGH (ref 11.5–15.5)
WBC Count: 3.8 10*3/uL — ABNORMAL LOW (ref 4.0–10.5)
nRBC: 0 % (ref 0.0–0.2)

## 2021-04-07 LAB — CMP (CANCER CENTER ONLY)
ALT: 14 U/L (ref 0–44)
AST: 24 U/L (ref 15–41)
Albumin: 3.1 g/dL — ABNORMAL LOW (ref 3.5–5.0)
Alkaline Phosphatase: 119 U/L (ref 38–126)
Anion gap: 9 (ref 5–15)
BUN: 5 mg/dL — ABNORMAL LOW (ref 8–23)
CO2: 27 mmol/L (ref 22–32)
Calcium: 9.4 mg/dL (ref 8.9–10.3)
Chloride: 104 mmol/L (ref 98–111)
Creatinine: 0.83 mg/dL (ref 0.61–1.24)
GFR, Estimated: >60 mL/min (ref >60)
Glucose, Bld: 126 mg/dL — ABNORMAL HIGH (ref 70–99)
Potassium: 3.2 mmol/L — ABNORMAL LOW (ref 3.5–5.1)
Sodium: 140 mmol/L (ref 135–145)
Total Bilirubin: 0.5 mg/dL (ref 0.3–1.2)
Total Protein: 6.8 g/dL (ref 6.5–8.1)

## 2021-04-07 LAB — TSH: TSH: 0.658 u[IU]/mL (ref 0.320–4.118)

## 2021-04-07 NOTE — Telephone Encounter (Signed)
Scheduled appointment per 07/20 sch msg. Left message.

## 2021-04-07 NOTE — Progress Notes (Signed)
[place] [phone]   [phone]  OFFICE PROGRESS NOTE  Seward Carol, MD [address] [place] [address]  DIAGNOSIS: Stage IV (T4, N3, M1c) non-small cell lung cancer, adenocarcinoma presented with left upper lobe lung mass in addition to bilateral mediastinal lymphadenopathy as well as bilateral pulmonary nodules and metastatic disease to the bones and brain diagnosed in July 2021.  Molecular studies by [employer] showed no actionable mutations.  Biomarker Findings by foundation 1. Microsatellite status - MS-Stable Tumor Mutational Burden - 5 Muts/Mb Genomic Findings For a complete list of the genes assayed, please refer to the Appendix. KEAP1 V419f*25 STK11 E256* CDKN2A/B [id number] R82* 8 Disease relevant genes with no repor  PDL1 expression: 1%  PRIOR THERAPY: None  CURRENT THERAPY: Systemic chemotherapy with carboplatin for AUC of 5, Alimta 500 mg/M2 and Keytruda 200 mg IV every 3 weeks.  First dose 04/06/2020.  Status post 16 cycles.  Starting from cycle #5 he is on maintenance treatment with Alimta and Keytruda every 3 weeks.  Starting cycle #14, I will add carboplatin again for AUC of 5 for the next 4 cycles.  INTERVAL HISTORY: Phillip WARREN80y.o. male returns to the clinic today for follow-up visit accompanied by his wife.  The patient continues to complain of generalized fatigue and weakness.  He also has lack of sleep and constipation.  He has a bowel movement every 2-3 days.  [name] using stool softener on as-needed basis.  He denied having any current chest pain, shortness of breath, cough or hemoptysis.  He denied having any fever or chills.  He has no nausea, vomiting, diarrhea or constipation.  He has no headache or visual changes.  He was supposed to have repeat CT scan of the chest, abdomen and pelvis before this visit but unfortunately the patient did not show up for his scan appointment.  [name] here  today for evaluation before starting cycle number 17 of his treatment.  MEDICAL HISTORY: Past Medical History:  Diagnosis Date   Depression    GERD (gastroesophageal reflux disease)    occasional, diet controlled   High cholesterol    Hypertension    no longer taking medication - states it's undercontrol   Hypothyroid     ALLERGIES:  is allergic to codeine, hydrocodone, oxycodone, and penicillins.  MEDICATIONS:  Current Outpatient Medications  Medication Sig Dispense Refill   acetaminophen (TYLENOL) 325 MG tablet Take 325-650 mg by mouth every 6 (six) hours as needed for moderate pain or headache.     benzonatate (TESSALON) 200 MG capsule Take 1 capsule (200 mg total) by mouth 4 (four) times daily as needed for cough. (Patient not taking: Reported on 03/17/2021) 60 capsule 1   chlorhexidine (PERIDEX) 0.12 % solution RINSE WITH 15ML THREE TIMES DAILY FOR MOUTH RINSE FOR 1 MINUTE THEN EXPECTORATE (Patient not taking: Reported on 03/17/2021)     citalopram (CELEXA) 20 MG tablet Take 1 tablet (20 mg total) by mouth daily. 30 tablet 3   dronabinol (MARINOL) 2.5 MG capsule TAKE 1 CAPSULE BY MOUTH TWICE DAILY BEFORE MEAL(S) 60 capsule 0   esomeprazole (NEXIUM) 20 MG capsule Take 20 mg by mouth daily as needed (acid reflux).  (Patient not taking: Reported on 67/16/9678     folic acid (FOLVITE) 1 MG tablet Take 1 tablet by mouth once daily 30 tablet 0   levothyroxine (SYNTHROID) 88 MCG tablet Take 88 mcg by mouth daily  before breakfast.     LORazepam (ATIVAN) 0.5 MG tablet Take 1-2 tablets by mouth 30 minutes before procedures, PRN anxiety. (Patient not taking: No sig reported) 8 tablet 0   prochlorperazine (COMPAZINE) 10 MG tablet TAKE 1 TABLET BY MOUTH EVERY 6 HOURS AS NEEDED FOR NAUSEA OR VOMITING (Patient not taking: Reported on 03/17/2021) 30 tablet 0   tamsulosin (FLOMAX) 0.4 MG CAPS capsule Take 0.4 mg by mouth every evening.      No current facility-administered medications for this visit.     SURGICAL HISTORY:  Past Surgical History:  Procedure Laterality Date   BRONCHIAL NEEDLE ASPIRATION BIOPSY  03/24/2020   Procedure: BRONCHIAL NEEDLE ASPIRATION BIOPSIES;  Surgeon: Candee Furbish, MD;  Location: [facility] ENDOSCOPY;  Service: Pulmonary;;   HEMORRHOID SURGERY     VIDEO BRONCHOSCOPY WITH ENDOBRONCHIAL ULTRASOUND N/A 03/24/2020   Procedure: VIDEO BRONCHOSCOPY WITH ENDOBRONCHIAL ULTRASOUND;  Surgeon: Candee Furbish, MD;  Location: [facility] ENDOSCOPY;  Service: Pulmonary;  Laterality: N/A;    REVIEW OF SYSTEMS:  A comprehensive review of systems was negative except for: Constitutional: positive for fatigue Gastrointestinal: positive for constipation Musculoskeletal: positive for muscle weakness Behavioral/Psych: positive for sleep disturbance   PHYSICAL EXAMINATION: General appearance: alert, cooperative, fatigued, and no distress Head: Normocephalic, without obvious abnormality, atraumatic Neck: no adenopathy, no JVD, supple, symmetrical, trachea midline, and thyroid not enlarged, symmetric, no tenderness/mass/nodules Lymph nodes: Cervical, supraclavicular, and axillary nodes normal. Resp: clear to auscultation bilaterally Back: symmetric, no curvature. ROM normal. No CVA tenderness. Cardio: regular rate and rhythm, S1, S2 normal, no murmur, click, rub or gallop GI: soft, non-tender; bowel sounds normal; no masses,  no organomegaly Extremities: extremities normal, atraumatic, no cyanosis or edema  ECOG PERFORMANCE STATUS: 1 - Symptomatic but completely ambulatory  Blood pressure (!) 128/57, pulse 78, temperature 97.8 F (36.6 C), temperature source Tympanic, resp. rate 18, height '5\' 9"'  (1.753 m), weight 131 lb 6.4 oz (59.6 kg), SpO2 100 %.  LABORATORY DATA: Lab Results  Component Value Date   WBC 3.8 (L) 04/07/2021   HGB 9.6 (L) 04/07/2021   HCT 30.1 (L) 04/07/2021   MCV 91.2 04/07/2021   PLT 181 04/07/2021      Chemistry      Component Value Date/Time   NA 140 03/31/2021  1522   K 3.6 03/31/2021 1522   CL 104 03/31/2021 1522   CO2 29 03/31/2021 1522   BUN 5 (L) 03/31/2021 1522   CREATININE 0.90 03/31/2021 1522      Component Value Date/Time   CALCIUM 9.4 03/31/2021 1522   ALKPHOS 134 (H) 03/31/2021 1522   AST 19 03/31/2021 1522   ALT 13 03/31/2021 1522   BILITOT 0.4 03/31/2021 1522       RADIOGRAPHIC STUDIES: No results found.  ASSESSMENT AND PLAN: This is a very pleasant 80 years old white male recently diagnosed with a stage IV (T4, N3, M1C) non-small cell lung cancer, adenocarcinoma presented with left upper lobe lung mass in addition to bilateral pulmonary nodules as well as bilateral hilar and mediastinal lymphadenopathy and metastatic disease to the bones and brain diagnosed in July 2021. He underwent stereotactic radiotherapy to the metastatic brain lesion under the care of Dr. Isidore Moos. Unfortunately the molecular studies by [employer] was negative for actionable mutations.  The initial tissue biopsy was insufficient for molecular studies by foundation 1. The patient had repeat biopsy for the molecular studies and the expectation is to have the result on 05/05/2020. He started systemic chemotherapy with carboplatin  for AUC of 5, Alimta 500 mg/M2 and Keytruda 200 mg IV every 3 weeks status post 16 cycles.  Starting from cycle #5 he is on maintenance treatment with Alimta and Keytruda.  Unfortunately his scan after cycle #13 showed interval enlargement of the left upper lobe lung mass with extension into the apical chest wall and some increase on the small pulmonary nodules suggestive of disease progression.  After discussion with the patient we changed his systemic chemotherapy to carboplatin for AUC of 5 in addition to his current maintenance treatment with Alimta and Keytruda starting from cycle #14 and this will be done for total of 4 cycles then maintenance treatment again if no evidence for disease progression. The patient has been tolerating this  treatment well with no concerning adverse effect except for fatigue. He was supposed to have repeat CT scan of the chest, abdomen and pelvis before this visit but he did not show up for his appointment because he wanted to sleep.  I had a lengthy discussion with the patient and his wife today and I strongly recommend for him to have the imaging studies done before we can proceed with any further treatment to see if the patient has any benefit from his current systemic chemotherapy. The patient agreed to the plan and will delay his chemo to start next week if he has no evidence for disease progression on the imaging studies. For the constipation he will continue with the stool softener and he will use milk of magnesia on as-needed basis. For the lack of sleep, he will try melatonin as well as Tylenol PM as needed for sleep. For the depression he is currently on Cymbalta. For the constipation, the patient was advised to continue on MiraLAX and also milk of magnesia as needed. The patient was advised to call immediately if he has any other concerning symptoms in the interval.  Disclaimer: This note was dictated with voice recognition software. Similar sounding words can inadvertently be transcribed and may not be corrected upon review.

## 2021-04-08 ENCOUNTER — Encounter: Payer: Self-pay | Admitting: Internal Medicine

## 2021-04-08 ENCOUNTER — Other Ambulatory Visit: Payer: Self-pay | Admitting: Physician Assistant

## 2021-04-08 DIAGNOSIS — E876 Hypokalemia: Secondary | ICD-10-CM

## 2021-04-08 MED ORDER — POTASSIUM CHLORIDE CRYS ER 20 MEQ PO TBCR: 20.0000 meq | EXTENDED_RELEASE_TABLET | Freq: Two times a day (BID) | ORAL | 0 refills | Status: DC

## 2021-04-09 ENCOUNTER — Telehealth: Payer: Self-pay | Admitting: Internal Medicine

## 2021-04-09 NOTE — Telephone Encounter (Signed)
Scheduled appointment per 07/22 sch msg. Left message.

## 2021-04-12 ENCOUNTER — Ambulatory Visit (HOSPITAL_COMMUNITY)
Admission: RE | Admit: 2021-04-12 | Discharge: 2021-04-12 | Disposition: A | Discharge status: Home / Self Care | Payer: Medicare PPO | Source: Ambulatory Visit | Attending: Internal Medicine | Admitting: Internal Medicine

## 2021-04-12 ENCOUNTER — Other Ambulatory Visit: Payer: Self-pay

## 2021-04-12 DIAGNOSIS — N281 Cyst of kidney, acquired: Secondary | ICD-10-CM | POA: Diagnosis not present

## 2021-04-12 DIAGNOSIS — C3490 Malignant neoplasm of unspecified part of unspecified bronchus or lung: Secondary | ICD-10-CM

## 2021-04-12 DIAGNOSIS — R911 Solitary pulmonary nodule: Secondary | ICD-10-CM | POA: Diagnosis not present

## 2021-04-12 DIAGNOSIS — C7951 Secondary malignant neoplasm of bone: Secondary | ICD-10-CM | POA: Diagnosis not present

## 2021-04-12 DIAGNOSIS — K7689 Other specified diseases of liver: Secondary | ICD-10-CM | POA: Diagnosis not present

## 2021-04-12 DIAGNOSIS — R918 Other nonspecific abnormal finding of lung field: Secondary | ICD-10-CM | POA: Diagnosis not present

## 2021-04-12 DIAGNOSIS — R590 Localized enlarged lymph nodes: Secondary | ICD-10-CM | POA: Diagnosis not present

## 2021-04-12 DIAGNOSIS — I700 Atherosclerosis of aorta: Secondary | ICD-10-CM | POA: Diagnosis not present

## 2021-04-12 IMAGING — CT CT CHEST W/ CM
2 of 5 series · 11 of 36 positions shown, 13 images · IV contrast (APPLIED)
Comparison: [DATE].  And [DATE].

CLINICAL DATA: Non-small cell lung cancer staging, history of stage
IV non-small cell lung cancer presenting with LEFT upper lobe mass
initially, associated with mediastinal adenopathy. Found to have
metastatic disease to bone and lung as well as brain.

EXAM:
CT CHEST, ABDOMEN, AND PELVIS WITH CONTRAST
TECHNIQUE: Multidetector CT imaging of the chest, abdomen and pelvis was
performed following the standard protocol during bolus
administration of intravenous contrast.
CONTRAST:  80mL OMNIPAQUE IOHEXOL 350 MG/ML SOLN

[Series 2: cap with · axial · 0.66mm/px · z∈[-391,+99]mm · 8 of 124 slices shown, 10 images]
[im 13/124  mediastinal]
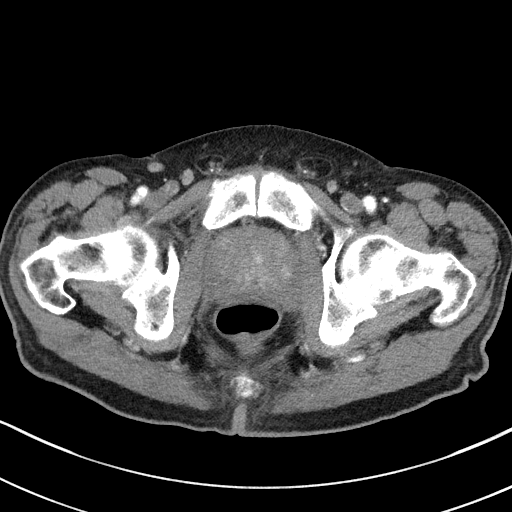
[im 13/124  lung]
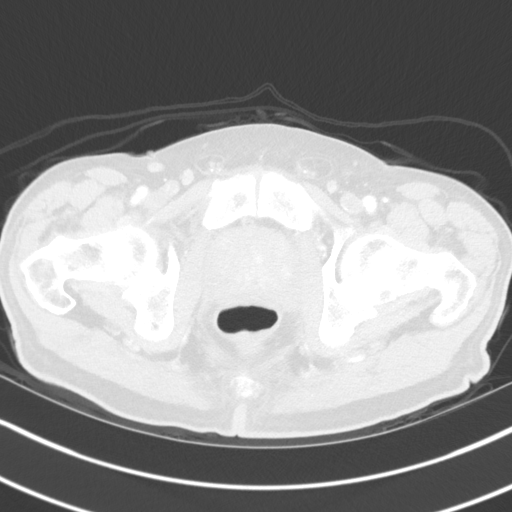
[im 25/124  lung]
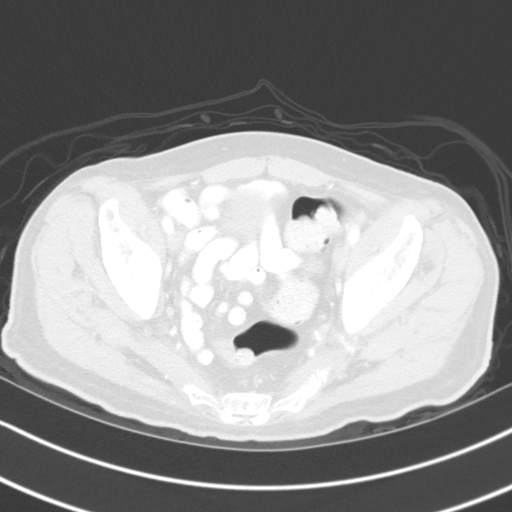
[im 37/124  lung]
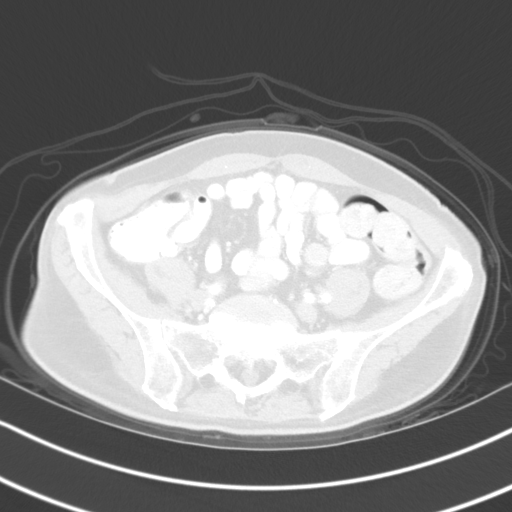
[im 50/124  lung]
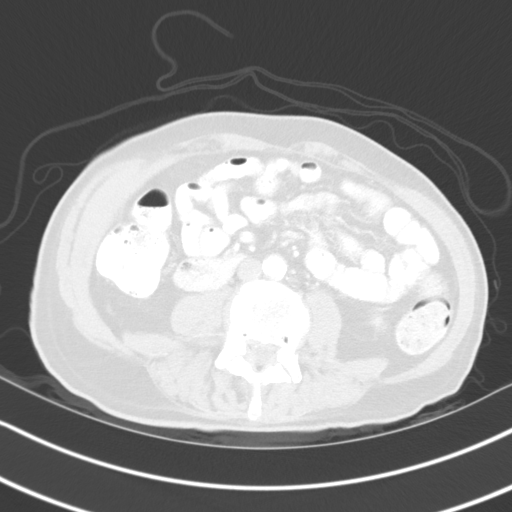
[im 74/124  mediastinal]
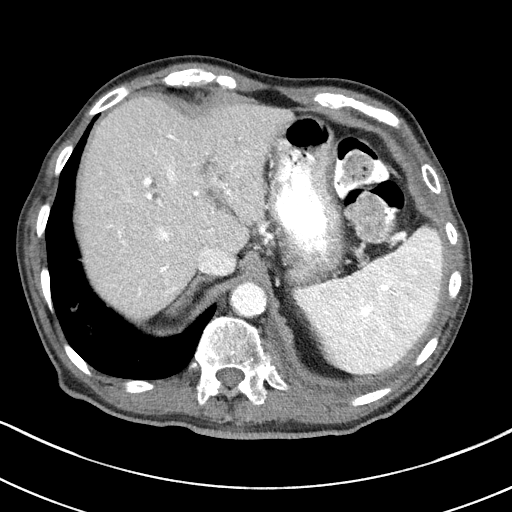
[im 74/124  lung]
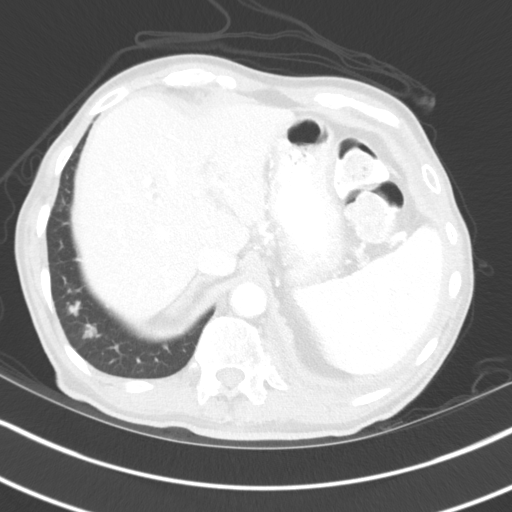
[im 87/124  lung]
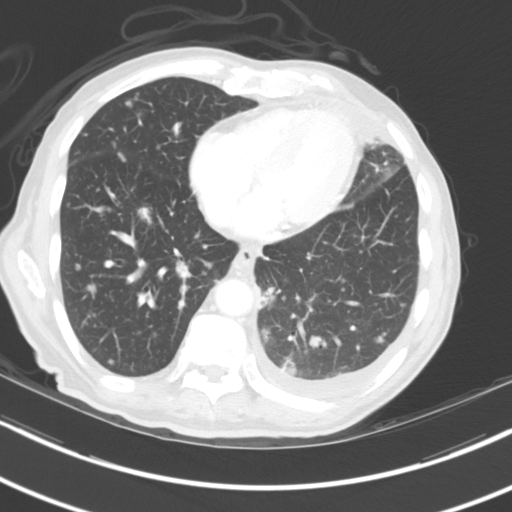
[im 99/124  lung]
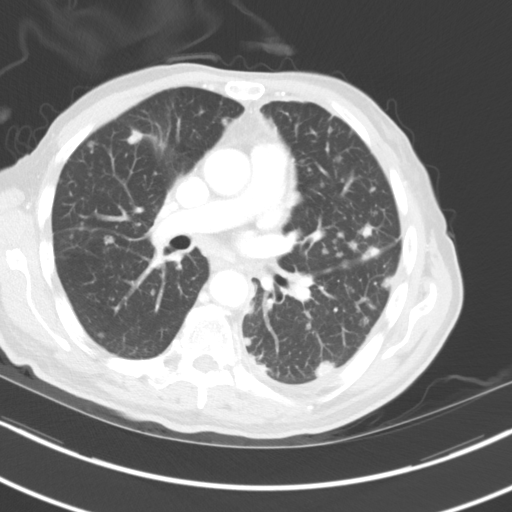
[im 111/124  lung]
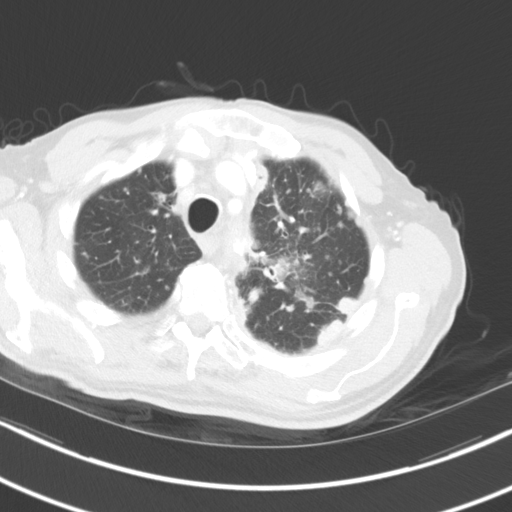

[Series 5: coronals · coronal · 0.72mm/px · 3 of 133 slices shown]
[im 27/133  lung]
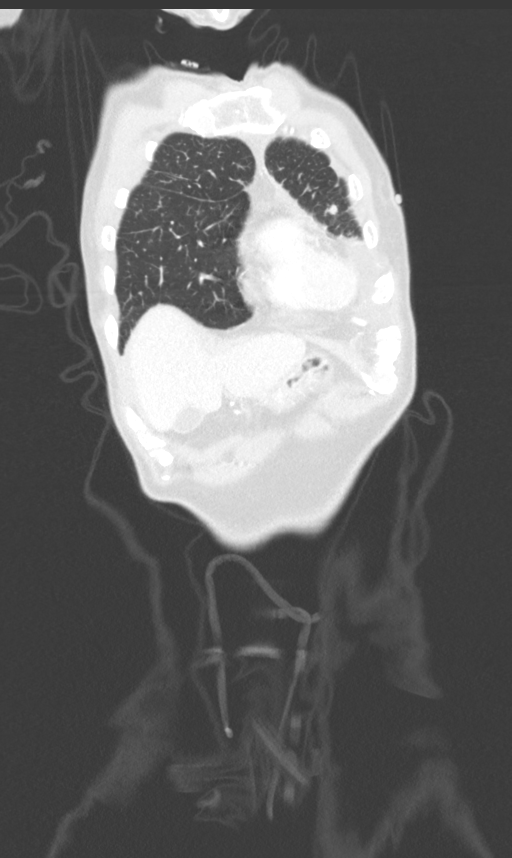
[im 53/133  lung]
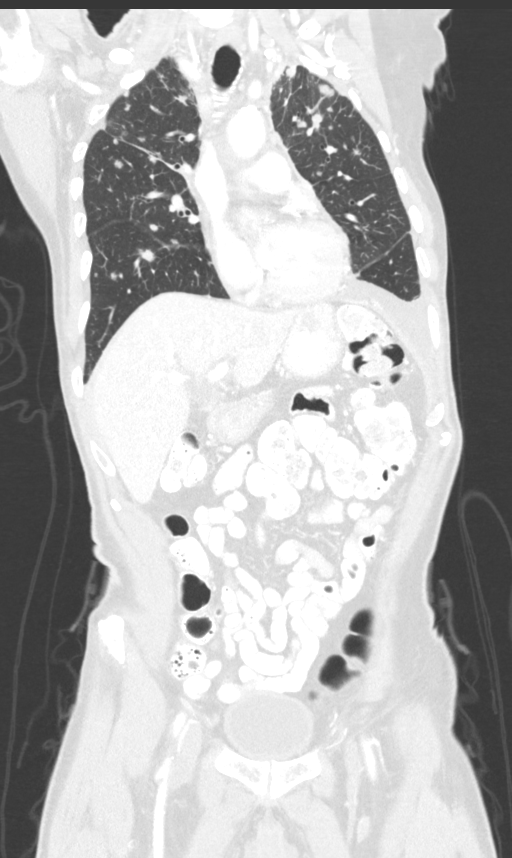
[im 80/133  lung]
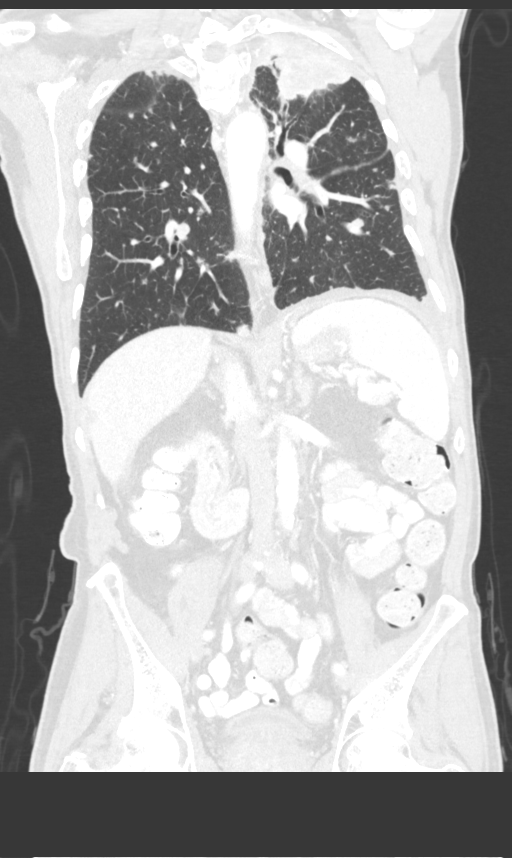

[11 of 36 positions shown; findings below may reference images not displayed]

FINDINGS: CT CHEST FINDINGSw

Cardiovascular: Calcified atheromatous plaque in the thoracic aorta.
No aneurysmal dilation. Three-vessel coronary artery disease. Normal
caliber of the central pulmonary vasculature.

Pericardial thickening, subtle suggested along the LEFT heart
border.

Mediastinum/Nodes: Scattered mediastinal lymph nodes with similar
appearance. RIGHT paratracheal lymph node approximately 9 mm is
unchanged (image [DATE])

Subcarinal nodal tissue 11 mm short axis within 1 mm of previous
measurement (image [DATE])

No hilar lymphadenopathy. Mild fullness of bilateral hilar nodal
tissue is a stable finding is. Small juxta esophageal lymph nodes in
the posterior mediastinum, for example on image 39 of series 2 at 8
mm, also stable in the short interval.

LEFT thoracic inlet lymph node (image [DATE]) not definitely seen
previously measuring approximately 7 mm.

Lungs/Pleura: LEFT upper lobe mass (image [DATE]) 6.9 x 6.0 cm as
compared to 4.9 x 3.9 cm on the prior study. Now clearly contacting
the medial pleural surface and extending into the extrapleural fat
of the LEFT lung apex. This represents a change compared to previous
imaging.

Enlarging pleural based lesion along the LEFT posterior chest (image
36/4) 4.6 x 1.2 cm previously at most as a single lesion
approximately 11 mm, confluent pleural disease along the LEFT
posterior chest where there were previously discrete cm and slightly
smaller nodules.

Anterior LEFT upper lobe (image [DATE]) 13 mm nodule at a site where
there were previously nodules in the 5-6 mm range.

Increase in septal thickening and nodularity.

LEFT lower lobe nodule (image 77/4) 13 mm within 1 mm of previous
measurement.

Most dramatic change in the LEFT lower chest is along the pleural
surface on image 59 of series 4 where pleural thickness is increased
to approximately 12 mm previously in the range of 3-4 mm tracking
along approximately 4 cm of the medial pleural surface in the LEFT
lower lobe on image 59 of series 4.

RIGHT lower lobe pulmonary nodule (image 81/4) 11 mm previously 5-6
mm.

RIGHT lower lobe pulmonary nodule (image 89/4) 15 mm previously
approximately 7-8 mm.

Resolution of RIGHT-sided pleural fluid. Stable appearance of
LEFT-sided pleural fluid.

CT ABDOMEN PELVIS FINDINGS

Hepatobiliary: Focal low-attenuation lesion in the LEFT hepatic lobe
(image 49/2) new compared to previous imaging measuring
approximately 17 mm.

Benign-appearing low-density lesion along periphery of the RIGHT
hepatic lobe is stable. Prominence of central biliary tree is
unchanged. No pericholecystic stranding. Patent portal vein.

Pancreas: Normal, without mass, inflammation or ductal dilatation.

Spleen: Spleen normal size and contour without focal lesion.

Adrenals/Urinary Tract: Adrenal glands are normal.

Symmetric renal enhancement. Cyst in the upper pole the LEFT kidney.
No hydronephrosis. Urinary bladder collapsed with impression upon
the bladder base from enlarged prostate.

Stomach/Bowel: No acute gastrointestinal process. Small bowel is
normal caliber. The appendix is normal.

Vascular/Lymphatic: Aortic atherosclerosis both calcified and
noncalcified, not associated with aneurysmal dilation. There is no
gastrohepatic or hepatoduodenal ligament lymphadenopathy. No
retroperitoneal or mesenteric lymphadenopathy.

No pelvic sidewall lymphadenopathy.

Reproductive: Prostatomegaly with heterogeneity as before.

Other: No ascites or peritoneal nodularity.

Musculoskeletal: Spinal degenerative changes. Signs of bony
metastatic disease with similar appearance. Destructive lesion at
the T9 level along the LEFT lateral aspect of the vertebral body
measures 13 mm. Numerous bones with subtle sclerotic foci, for
instance in the RIGHT scapula on image 7 of series 2 measuring
approximately 10 mm, unchanged. Scattered sclerotic foci throughout
the ribs and thoracic spine.

Sclerosis in the RIGHT ischium with subcentimeter area of sclerosis
on image 113 of series 2 also serves as an example of bony
metastatic disease.
IMPRESSION: 1. Increase in the size of the LEFT upper lobe mass with signs of
chest wall extension now extending across the upper chest contacting
the medial pleural surface.
2. Increasing size of adjacent nodules in the LEFT upper lobe in
particular and worsening of pleural disease in the LEFT chest when
compared to the prior study.
3. Interval enlargement of some of the other pulmonary nodules still
with numerous pulmonary nodules and signs of suspected lymphangitic
carcinomatosis.
4. LEFT supraclavicular lymph node borderline size with stable
appearance of mediastinal lymph nodes, attention on follow-up.
5. New hepatic metastatic lesion.
6. Similar appearance of bony metastatic disease.
7. Aortic atherosclerosis.

## 2021-04-12 MED ORDER — IOHEXOL 350 MG/ML SOLN
80.0000 mL | Freq: Once | INTRAVENOUS | Status: AC | PRN
  Administered 2021-04-12: 80 mL via INTRAVENOUS

## 2021-04-13 ENCOUNTER — Encounter: Payer: Self-pay | Admitting: Internal Medicine

## 2021-04-13 ENCOUNTER — Inpatient Hospital Stay: Payer: Medicare PPO | Admitting: Internal Medicine

## 2021-04-13 ENCOUNTER — Other Ambulatory Visit: Payer: Self-pay

## 2021-04-13 ENCOUNTER — Inpatient Hospital Stay: Payer: Medicare PPO

## 2021-04-13 VITALS — BP 127/53 | HR 78 | Temp 98.1°F | Resp 18 | Wt 131.3 lb

## 2021-04-13 DIAGNOSIS — C3492 Malignant neoplasm of unspecified part of left bronchus or lung: Secondary | ICD-10-CM

## 2021-04-13 DIAGNOSIS — R059 Cough, unspecified: Secondary | ICD-10-CM | POA: Diagnosis not present

## 2021-04-13 DIAGNOSIS — D702 Other drug-induced agranulocytosis: Secondary | ICD-10-CM | POA: Diagnosis not present

## 2021-04-13 DIAGNOSIS — Z87891 Personal history of nicotine dependence: Secondary | ICD-10-CM | POA: Diagnosis not present

## 2021-04-13 DIAGNOSIS — C7951 Secondary malignant neoplasm of bone: Secondary | ICD-10-CM | POA: Diagnosis not present

## 2021-04-13 DIAGNOSIS — F32A Depression, unspecified: Secondary | ICD-10-CM | POA: Diagnosis not present

## 2021-04-13 DIAGNOSIS — C7931 Secondary malignant neoplasm of brain: Secondary | ICD-10-CM | POA: Diagnosis not present

## 2021-04-13 DIAGNOSIS — R5383 Other fatigue: Secondary | ICD-10-CM | POA: Diagnosis not present

## 2021-04-13 DIAGNOSIS — Z5111 Encounter for antineoplastic chemotherapy: Secondary | ICD-10-CM

## 2021-04-13 DIAGNOSIS — Z79899 Other long term (current) drug therapy: Secondary | ICD-10-CM | POA: Diagnosis not present

## 2021-04-13 LAB — CMP (CANCER CENTER ONLY)
ALT: 7 U/L (ref 0–44)
AST: 17 U/L (ref 15–41)
Albumin: 2.7 g/dL — ABNORMAL LOW (ref 3.5–5.0)
Alkaline Phosphatase: 128 U/L — ABNORMAL HIGH (ref 38–126)
Anion gap: 8 (ref 5–15)
BUN: 5 mg/dL — ABNORMAL LOW (ref 8–23)
CO2: 29 mmol/L (ref 22–32)
Calcium: 9.6 mg/dL (ref 8.9–10.3)
Chloride: 103 mmol/L (ref 98–111)
Creatinine: 0.84 mg/dL (ref 0.61–1.24)
GFR, Estimated: >60 mL/min (ref >60)
Glucose, Bld: 97 mg/dL (ref 70–99)
Potassium: 3.6 mmol/L (ref 3.5–5.1)
Sodium: 140 mmol/L (ref 135–145)
Total Bilirubin: 0.3 mg/dL (ref 0.3–1.2)
Total Protein: 7 g/dL (ref 6.5–8.1)

## 2021-04-13 LAB — CBC WITH DIFFERENTIAL (CANCER CENTER ONLY)
Abs Immature Granulocytes: 0.01 10*3/uL (ref 0.00–0.07)
Basophils Absolute: 0.1 10*3/uL (ref 0.0–0.1)
Basophils Relative: 1 %
Eosinophils Absolute: 0.2 10*3/uL (ref 0.0–0.5)
Eosinophils Relative: 3 %
HCT: 32.1 % — ABNORMAL LOW (ref 39.0–52.0)
Hemoglobin: 9.9 g/dL — ABNORMAL LOW (ref 13.0–17.0)
Immature Granulocytes: 0 %
Lymphocytes Relative: 21 %
Lymphs Abs: 1.3 10*3/uL (ref 0.7–4.0)
MCH: 28.3 pg (ref 26.0–34.0)
MCHC: 30.8 g/dL (ref 30.0–36.0)
MCV: 91.7 fL (ref 80.0–100.0)
Monocytes Absolute: 1 10*3/uL (ref 0.1–1.0)
Monocytes Relative: 16 %
Neutro Abs: 3.7 10*3/uL (ref 1.7–7.7)
Neutrophils Relative %: 59 %
Platelet Count: 158 10*3/uL (ref 150–400)
RBC: 3.5 MIL/uL — ABNORMAL LOW (ref 4.22–5.81)
RDW: 18.6 % — ABNORMAL HIGH (ref 11.5–15.5)
WBC Count: 6.2 10*3/uL (ref 4.0–10.5)
nRBC: 0 % (ref 0.0–0.2)

## 2021-04-13 MED FILL — Dexamethasone Sodium Phosphate Inj 100 MG/10ML: INTRAMUSCULAR | Qty: 1 | Status: AC

## 2021-04-13 MED FILL — Fosaprepitant Dimeglumine For IV Infusion 150 MG (Base Eq): INTRAVENOUS | Qty: 5 | Status: AC

## 2021-04-13 NOTE — Progress Notes (Signed)
[place] Telephone:([phone]) [phone]   Fax:(336) [phone]  OFFICE PROGRESS NOTE  Seward Carol, MD [address] [place] [address]  DIAGNOSIS: Stage IV (T4, N3, M1c) non-small cell lung cancer, adenocarcinoma presented with left upper lobe lung mass in addition to bilateral mediastinal lymphadenopathy as well as bilateral pulmonary nodules and metastatic disease to the bones and brain diagnosed in July 2021.  Molecular studies by [employer] showed no actionable mutations.  Biomarker Findings by foundation 1. Microsatellite status - MS-Stable Tumor Mutational Burden - 5 Muts/Mb Genomic Findings For a complete list of the genes assayed, please refer to the Appendix. KEAP1 V457f*25 STK11 E256* CDKN2A/B [id number] R82* 8 Disease relevant genes with no repor  PDL1 expression: 1%  PRIOR THERAPY: Systemic chemotherapy with carboplatin for AUC of 5, Alimta 500 mg/M2 and Keytruda 200 mg IV every 3 weeks.  First dose 04/06/2020.  Status post 16 cycles.  Starting from cycle #5 he is on maintenance treatment with Alimta and Keytruda every 3 weeks.  Starting cycle #14, I will add carboplatin again for AUC of 5 for the next 4 cycles.  CURRENT THERAPY: Second line systemic chemotherapy with gemcitabine 1000 Mg/M2 on days 1 and 8 every 3 weeks.  First dose April 21, 2021.   INTERVAL HISTORY: Phillip [id number].o. male returns to the clinic today for follow-up visit accompanied by his wife [name]  The patient continues to complain of the generalized fatigue and weakness.  His weight has been stable and he started eating a little bit more.  He denied having any current chest pain, shortness of breath, cough or hemoptysis.  He denied having any fever or chills.  He has no nausea, vomiting, diarrhea or constipation.  He has no headache or visual changes.  He has been tolerating his systemic chemotherapy fairly well.  The patient had repeat CT scan of the chest,  abdomen pelvis performed yesterday and he is here today for evaluation and discussion of his scan results and treatment options.  MEDICAL HISTORY: Past Medical History:  Diagnosis Date   Depression    GERD (gastroesophageal reflux disease)    occasional, diet controlled   High cholesterol    Hypertension    no longer taking medication - states it's undercontrol   Hypothyroid     ALLERGIES:  is allergic to codeine, hydrocodone, oxycodone, and penicillins.  MEDICATIONS:  Current Outpatient Medications  Medication Sig Dispense Refill   acetaminophen (TYLENOL) 325 MG tablet Take 325-650 mg by mouth every 6 (six) hours as needed for moderate pain or headache.     citalopram (CELEXA) 20 MG tablet Take 1 tablet (20 mg total) by mouth daily. 30 tablet 3   dronabinol (MARINOL) 2.5 MG capsule TAKE 1 CAPSULE BY MOUTH TWICE DAILY BEFORE MEAL(S) 60 capsule 0   folic acid (FOLVITE) 1 MG tablet Take 1 tablet by mouth once daily 30 tablet 0   levothyroxine (SYNTHROID) 88 MCG tablet Take 88 mcg by mouth daily before breakfast.     potassium chloride SA (KLOR-CON) 20 MEQ tablet Take 1 tablet (20 mEq total) by mouth 2 (two) times daily. 6 tablet 0   tamsulosin (FLOMAX) 0.4 MG CAPS capsule Take 0.4 mg by mouth every evening.      benzonatate (TESSALON) 200 MG capsule Take 1 capsule (200 mg total) by mouth 4 (four) times daily as needed for cough. (Patient not taking: No sig reported) 60 capsule 1   chlorhexidine (  PERIDEX) 0.12 % solution RINSE WITH 15ML THREE TIMES DAILY FOR MOUTH RINSE FOR 1 MINUTE THEN EXPECTORATE (Patient not taking: No sig reported)     esomeprazole (NEXIUM) 20 MG capsule Take 20 mg by mouth daily as needed (acid reflux).  (Patient not taking: No sig reported)     LORazepam (ATIVAN) 0.5 MG tablet Take 1-2 tablets by mouth 30 minutes before procedures, PRN anxiety. (Patient not taking: No sig reported) 8 tablet 0   prochlorperazine (COMPAZINE) 10 MG tablet TAKE 1 TABLET BY MOUTH EVERY 6  HOURS AS NEEDED FOR NAUSEA OR VOMITING (Patient not taking: No sig reported) 30 tablet 0   No current facility-administered medications for this visit.    SURGICAL HISTORY:  Past Surgical History:  Procedure Laterality Date   BRONCHIAL NEEDLE ASPIRATION BIOPSY  03/24/2020   Procedure: BRONCHIAL NEEDLE ASPIRATION BIOPSIES;  Surgeon: Candee Furbish, MD;  Location: [facility] ENDOSCOPY;  Service: Pulmonary;;   HEMORRHOID SURGERY     VIDEO BRONCHOSCOPY WITH ENDOBRONCHIAL ULTRASOUND N/A 03/24/2020   Procedure: VIDEO BRONCHOSCOPY WITH ENDOBRONCHIAL ULTRASOUND;  Surgeon: Candee Furbish, MD;  Location: [facility] ENDOSCOPY;  Service: Pulmonary;  Laterality: N/A;    REVIEW OF SYSTEMS:  Constitutional: positive for anorexia and fatigue Eyes: negative Ears, nose, mouth, throat, and face: negative Respiratory: negative Cardiovascular: negative Gastrointestinal: negative Genitourinary:negative Integument/breast: negative Hematologic/lymphatic: negative Musculoskeletal:positive for muscle weakness Neurological: negative Behavioral/Psych: negative Endocrine: negative Allergic/Immunologic: negative   PHYSICAL EXAMINATION: General appearance: alert, cooperative, fatigued, and no distress Head: Normocephalic, without obvious abnormality, atraumatic Neck: no adenopathy, no JVD, supple, symmetrical, trachea midline, and thyroid not enlarged, symmetric, no tenderness/mass/nodules Lymph nodes: Cervical, supraclavicular, and axillary nodes normal. Resp: clear to auscultation bilaterally Back: symmetric, no curvature. ROM normal. No CVA tenderness. Cardio: regular rate and rhythm, S1, S2 normal, no murmur, click, rub or gallop GI: soft, non-tender; bowel sounds normal; no masses,  no organomegaly Extremities: extremities normal, atraumatic, no cyanosis or edema Neurologic: Alert and oriented X 3, normal strength and tone. Normal symmetric reflexes. Normal coordination and gait  ECOG PERFORMANCE STATUS: 1 -  Symptomatic but completely ambulatory  Blood pressure (!) 127/53, pulse 78, temperature 98.1 F (36.7 C), temperature source Tympanic, resp. rate 18, weight 131 lb 5 oz (59.6 kg), SpO2 98 %.  LABORATORY DATA: Lab Results  Component Value Date   WBC 6.2 04/13/2021   HGB 9.9 (L) 04/13/2021   HCT 32.1 (L) 04/13/2021   MCV 91.7 04/13/2021   PLT 158 04/13/2021      Chemistry      Component Value Date/Time   NA 140 04/13/2021 1308   K 3.6 04/13/2021 1308   CL 103 04/13/2021 1308   CO2 29 04/13/2021 1308   BUN 5 (L) 04/13/2021 1308   CREATININE 0.84 04/13/2021 1308      Component Value Date/Time   CALCIUM 9.6 04/13/2021 1308   ALKPHOS 128 (H) 04/13/2021 1308   AST 17 04/13/2021 1308   ALT 7 04/13/2021 1308   BILITOT 0.3 04/13/2021 1308       RADIOGRAPHIC STUDIES: No results found.  ASSESSMENT AND PLAN: This is a very pleasant 80 years old white male recently diagnosed with a stage IV (T4, N3, M1C) non-small cell lung cancer, adenocarcinoma presented with left upper lobe lung mass in addition to bilateral pulmonary nodules as well as bilateral hilar and mediastinal lymphadenopathy and metastatic disease to the bones and brain diagnosed in July 2021. He underwent stereotactic radiotherapy to the metastatic brain lesion under the care of  Dr. Isidore Moos. Unfortunately the molecular studies by [employer] was negative for actionable mutations.  The initial tissue biopsy was insufficient for molecular studies by foundation 1. The patient had repeat biopsy for the molecular studies and the expectation is to have the result on 05/05/2020. He started systemic chemotherapy with carboplatin for AUC of 5, Alimta 500 mg/M2 and Keytruda 200 mg IV every 3 weeks status post 16 cycles.  Starting from cycle #5 he is on maintenance treatment with Alimta and Keytruda.  Unfortunately his scan after cycle #13 showed interval enlargement of the left upper lobe lung mass with extension into the apical chest  wall and some increase on the small pulmonary nodules suggestive of disease progression.  After discussion with the patient we changed his systemic chemotherapy to carboplatin for AUC of 5 in addition to his current maintenance treatment with Alimta and Keytruda starting from cycle #14 and this will be done for total of 4 cycles then maintenance treatment again if no evidence for disease progression. He has been tolerating this treatment fairly well except for the fatigue. He had repeat CT scan of the chest, abdomen pelvis performed yesterday. I personally and independently reviewed the scan images and discussed the result and showed the images to the patient and his wife today. Unfortunately his scan showed significant disease progression with increase in the size of multiple pulmonary nodules in addition to new liver lesions. I discussed with the patient and his wife several options for management of his condition including palliative care and hospice referral versus consideration of second line treatment with single agent gemcitabine versus consideration of treatment with docetaxel and Cyramza which has more expected side effects and single agent gemcitabine. The patient is interested in treatment and he would like to proceed with the treatment with gemcitabine. He will be treated with gemcitabine 1000 Mg/M2 on days 1 and 8 every 3 weeks.  [name] expected to start the first cycle of this treatment next week. I discussed with the patient the adverse effect of this treatment including but not limited to mild alopecia, myelosuppression, nausea and vomiting, peripheral neuropathy, liver or renal dysfunction. The patient's wife is currently under a lot of stress taking care of him with his current condition and I will consult the social worker to evaluate their situation and help as needed. He will come back for follow-up visit in around 4 weeks with the start of day 1 of cycle #2. For the constipation he  will continue with the stool softener and he will use milk of magnesia on as-needed basis. For the lack of sleep, he will try melatonin as well as Tylenol PM as needed for sleep. For the depression he is currently on Cymbalta. The patient was advised to call immediately if he has any other concerning symptoms in the interval.  Disclaimer: This note was dictated with voice recognition software. Similar sounding words can inadvertently be transcribed and may not be corrected upon review.

## 2021-04-13 NOTE — Progress Notes (Signed)
START OFF PATHWAY REGIMEN - Non-Small Cell Lung   [id number]:Gemcitabine 1,000 mg/m2 D1, 8  q21 Days:   A cycle is every 21 days:     Gemcitabine   **Always confirm dose/schedule in your pharmacy ordering system**  Patient Characteristics: Stage IV Metastatic, Nonsquamous, Molecular Analysis Completed, Molecular Alteration Present and Targeted Therapy Exhausted OR EGFR Exon 20+ or KRAS G12C+ Present and No Prior Chemo/Immunotherapy OR No Alteration Present, Second Line -  Chemotherapy/Immunotherapy, PS = 0, 1, No Prior PD-1/PD-L1  Inhibitor or Prior PD-1/PD-L1 Inhibitor + Chemotherapy, and Not a Candidate for Immunotherapy Therapeutic Status: Stage IV Metastatic Histology: Nonsquamous Cell Broad Molecular Profiling Status: Engineer, manufacturing Analysis Results: No Alteration Present ECOG Performance Status: 1 Chemotherapy/Immunotherapy Line of Therapy: Second Line Chemotherapy/Immunotherapy Immunotherapy Candidate Status: Not a Candidate for Immunotherapy Prior Immunotherapy Status: Prior PD-1/PD-L1 Inhibitor + Chemotherapy Intent of Therapy: Non-Curative / Palliative Intent, Discussed with Patient

## 2021-04-14 ENCOUNTER — Inpatient Hospital Stay: Payer: Medicare PPO

## 2021-04-14 ENCOUNTER — Encounter: Payer: Self-pay | Admitting: General Practice

## 2021-04-14 NOTE — Progress Notes (Signed)
..  The following Assist/Replace Program for [facility] from [place] has been terminated due to Per Dr. Julien Nordmann treatment discontinued due to disease progression on 04/13/2021.  Last DOS: 03/17/2021. [place] KitchenJuan Quam, CPhT IV Drug Replacement Specialist [place] Phone: [phone]

## 2021-04-14 NOTE — Progress Notes (Signed)
[place] CSW Progress Notes  Call to wife at request of medical oncologist.  She is experiencing caregiver stress, had "difficult" visit w "bad news" yesterday.  They have upcoming appointments w neuro oncologist to discuss scan results.  Wife has other family related difficulties as well as dealing with husband's issues.  Children have come to help "but they have their lives", no other family nearby.  Wife also had TIA and vertigo in May 2022.  She is supposed to conserve her energy due to her own health needs.  Wife is from [place], her family does not live in [place].    Per wife, patient "gets cranky" about the "normal things", argues over eating, drinking, does not want to go to sleep.  Sleeps during day and stays up at night.  Does not wake up in the mornings.  Needs help w bathing patient.  Balance is difficult.    Discussed option of hiring help w patient care - they do have some resources. She identifies son and another medically trained individual who can come intermittently to help.  Wife has given up activities of interest to her (painting for example).  She does get out in the morning when patient is sleeping - a neighbor or son are informed when she leaves the home and can check on him.  She has difficulty tending to her own needs, feels guilty when she leaves patient on his own.    Offered resources available through [employer], will mail her information on these.  Offered Caregiver support Group - unfortunately she has tried to participate in Zoom groups and has not had a good experience due to connectivity issues.  Encouraged her to contact [facility] [employer] at [employer] for resources for caregiver support and nonskilled in home care.  Affirmed need for care partners to take regular breaks and have time to pursue their own interests.  She will have my contact information and will contact us as needed for support/resources.  Edwyna Shell, LCSW Clinical Social  Worker Phone:  [phone]

## 2021-04-14 NOTE — Progress Notes (Signed)
..  The following Assist/Replace Program for Alimta from [facility] has been terminated due to Per Dr. Julien Nordmann treatment discontinued due to disease progression on 04/13/2021.  Last DOS: 03/17/2021. Marland KitchenJuan Quam, CPhT IV Drug Replacement Specialist [place] Phone: [phone]

## 2021-04-16 ENCOUNTER — Ambulatory Visit
Admission: RE | Admit: 2021-04-16 | Discharge: 2021-04-16 | Disposition: A | Discharge status: Home / Self Care | Payer: Medicare PPO | Source: Ambulatory Visit | Attending: Internal Medicine | Admitting: Internal Medicine

## 2021-04-16 ENCOUNTER — Other Ambulatory Visit: Payer: Self-pay

## 2021-04-16 DIAGNOSIS — C3490 Malignant neoplasm of unspecified part of unspecified bronchus or lung: Secondary | ICD-10-CM | POA: Diagnosis not present

## 2021-04-16 DIAGNOSIS — I619 Nontraumatic intracerebral hemorrhage, unspecified: Secondary | ICD-10-CM | POA: Diagnosis not present

## 2021-04-16 DIAGNOSIS — C719 Malignant neoplasm of brain, unspecified: Secondary | ICD-10-CM | POA: Diagnosis not present

## 2021-04-16 DIAGNOSIS — G9389 Other specified disorders of brain: Secondary | ICD-10-CM | POA: Diagnosis not present

## 2021-04-16 DIAGNOSIS — C7931 Secondary malignant neoplasm of brain: Secondary | ICD-10-CM

## 2021-04-16 IMAGING — MR MR HEAD WO/W CM
12 series · 48 of 48 positions shown · IV contrast (multihance)
Comparison: MR head without and with contrast [DATE] and
[DATE]

CLINICAL DATA: Brain CNS neoplasm. Assess treatment response.
Metastatic non-small cell lung cancer.

EXAM:
MRI HEAD WITHOUT AND WITH CONTRAST
TECHNIQUE: Multiplanar, multiecho pulse sequences of the brain and surrounding
structures were obtained without and with intravenous contrast.
CONTRAST:  13mL MULTIHANCE GADOBENATE DIMEGLUMINE 529 MG/ML IV SOLN

[Series 2: FLAIR · sagittal · 3.0mm · 0.75mm/px · 3 of 39 slices shown (1 of 2)]
[im 1/39]
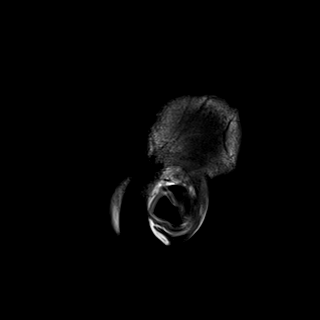
[im 20/39]
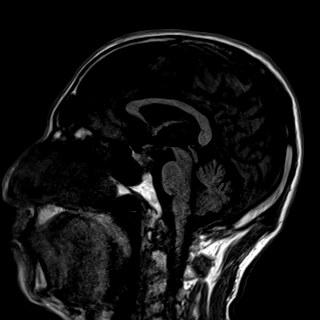
[im 39/39]
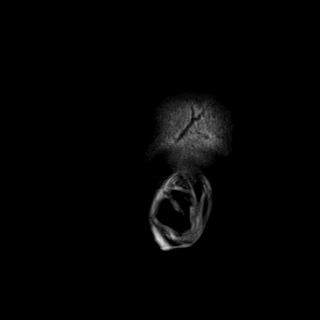

[Series 3: DWI · axial · 3.0mm · 1.50mm/px · z∈[-64,+81]mm · 4 of 78 slices shown (1 of 2)]
[im 1/78]
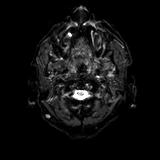
[im 26/78]
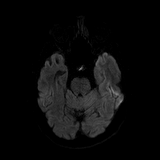
[im 52/78]
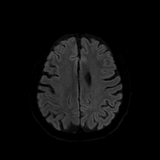
[im 78/78]
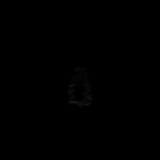

[Series 4: DWI · axial · 3.0mm · 1.50mm/px · z∈[-64,+81]mm · 2 of 39 slices shown (2 of 2)]
[im 1/39]
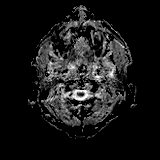
[im 39/39]
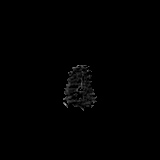

[Series 5: T2 · axial · 5.0mm · 0.57mm/px · 1 of 27 slices shown (1 of 2)]
[im 1/27]
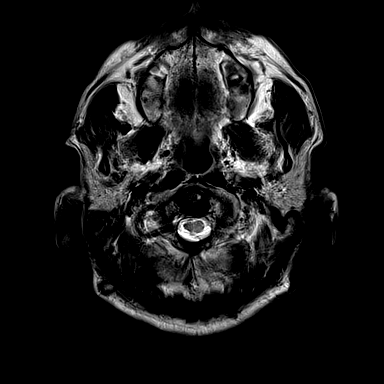

[Series 6: swi_images · axial · 1.5mm · 0.90mm/px · z∈[-60,+80]mm · 5 of 96 slices shown]
[im 1/96]
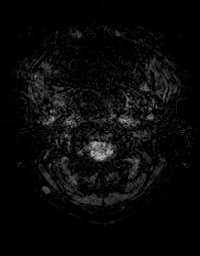
[im 24/96]
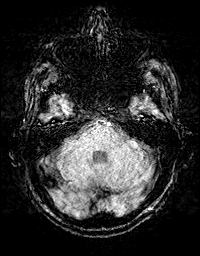
[im 48/96]
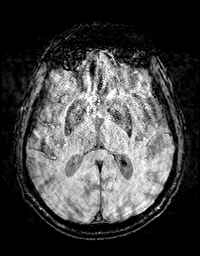
[im 72/96]
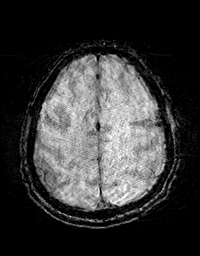
[im 96/96]
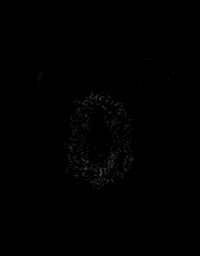

[Series 8: FLAIR · axial · 3.0mm · 0.86mm/px · z∈[-106,+83]mm · 3 of 63 slices shown (2 of 2)]
[im 1/63]
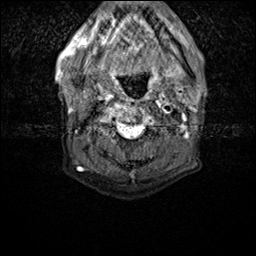
[im 32/63]
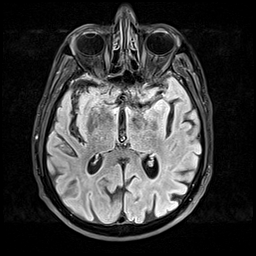
[im 63/63]
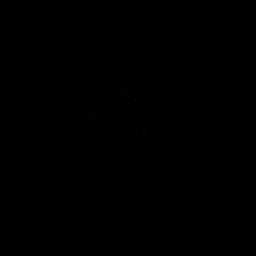

[Series 9: T2 · axial · non-contrast · 1.0mm · 0.86mm/px · z∈[-80,+75]mm · 8 of 160 slices shown (2 of 2)]
[im 1/160]
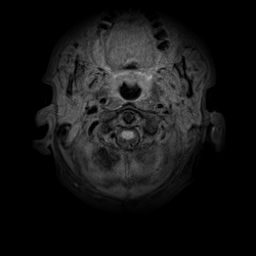
[im 23/160]
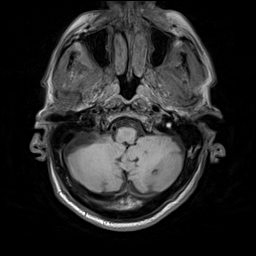
[im 46/160]
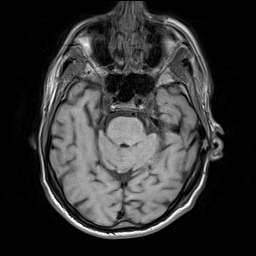
[im 69/160]
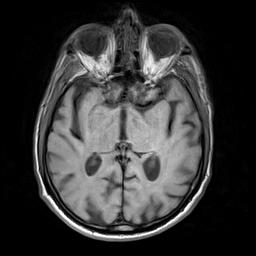
[im 91/160]
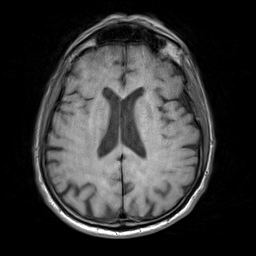
[im 114/160]
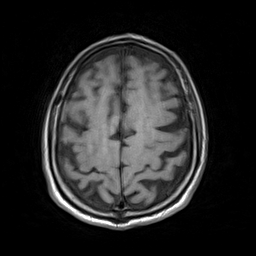
[im 137/160]
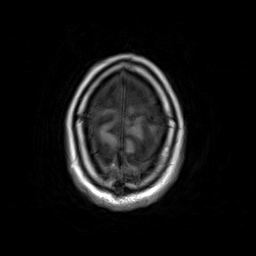
[im 160/160]
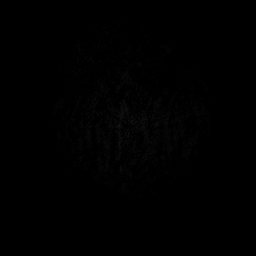

[Series 10: T2 post-contrast · coronal · 3.0mm · 0.57mm/px · 2 of 47 slices shown (1 of 2)]
[im 1/47]
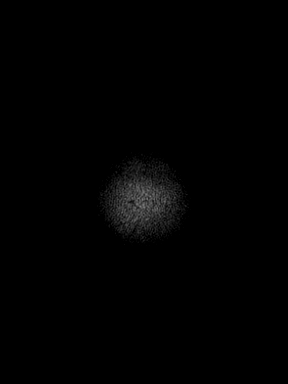
[im 47/47]
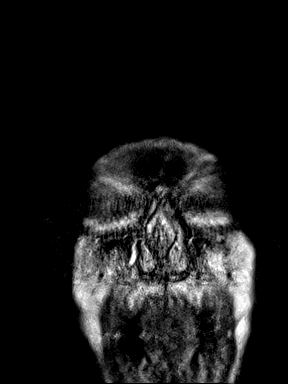

[Series 11: T2 post-contrast · axial · 1.0mm · 0.86mm/px · z∈[-80,+75]mm · 8 of 160 slices shown (2 of 2)]
[im 1/160]
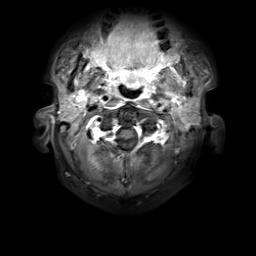
[im 23/160]
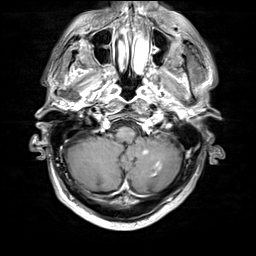
[im 46/160]
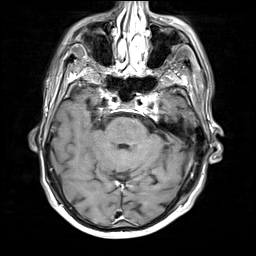
[im 69/160]
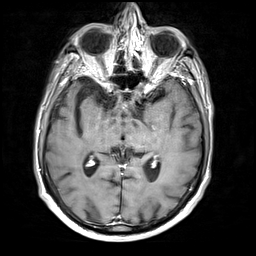
[im 91/160]
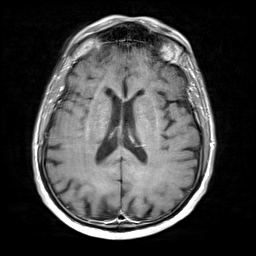
[im 114/160]
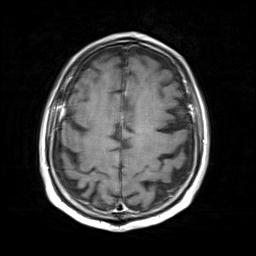
[im 137/160]
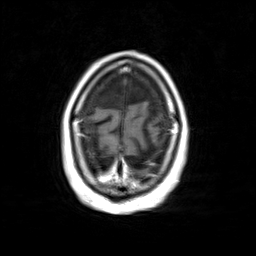
[im 160/160]
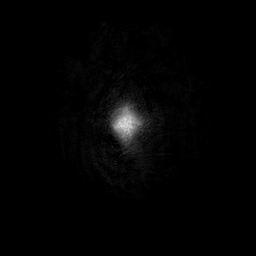

[Series 12: T1 post-contrast · axial · 1.0mm · 0.75mm/px · z∈[-82,+77]mm · 8 of 160 slices shown (1 of 2)]
[im 1/160]
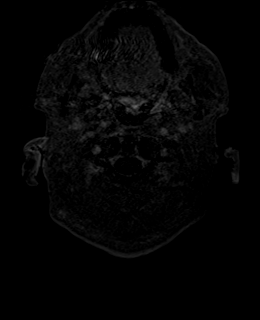
[im 23/160]
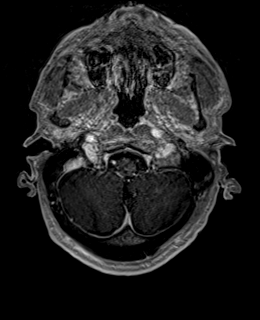
[im 46/160]
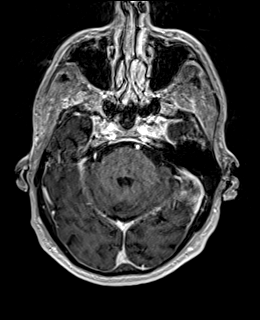
[im 69/160]
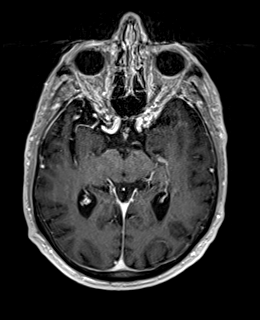
[im 91/160]
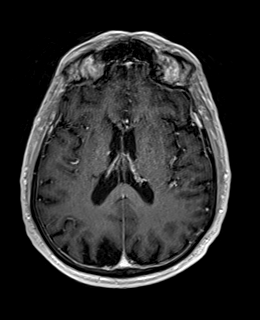
[im 114/160]
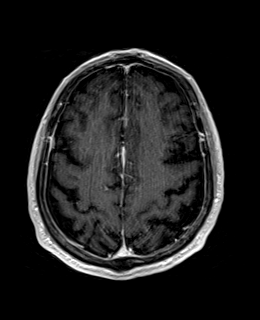
[im 137/160]
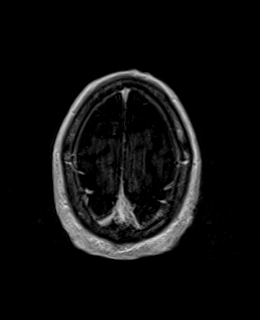
[im 160/160]
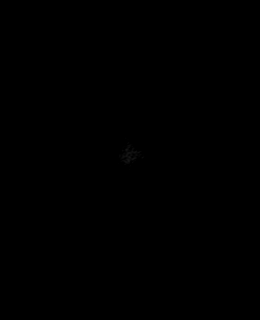

[Series 13: T1 post-contrast · coronal · 3.0mm · 0.57mm/px · 2 of 47 slices shown (2 of 2)]
[im 1/47]
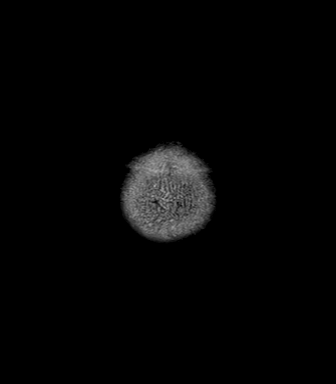
[im 47/47]
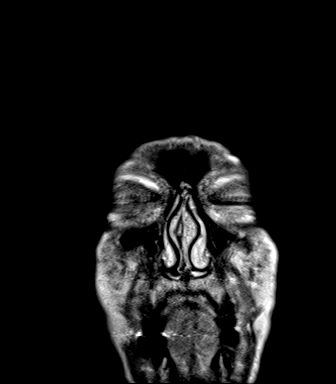

[Series 14: FLAIR post-contrast · sagittal · 3.0mm · 0.75mm/px · 2 of 39 slices shown]
[im 1/39]
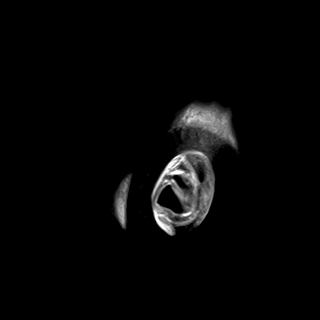
[im 39/39]
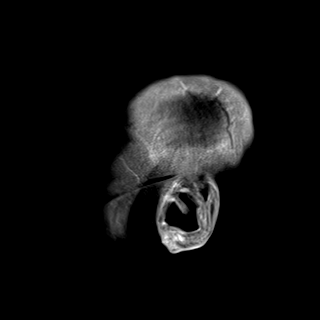

[48 of 48 positions shown; findings below may reference images not displayed]

FINDINGS: Brain: Previously noted enhancing lesions in the cerebellum again
seen. The most posterior lesion on the left is increased in size,
now measuring 8 x 8 x 6 mm. More anterior left cerebellar lesion is
also increased in size, now measuring up to 4 mm. Is linear
enhancement anterior and lateral to the largest lesion.

The previously noted right-sided lesion is increased in size, now
measuring up to 3 mm. Linear enhancement is noted just below the
tentorium on the right. Progressive T2 changes are noted in the left
cerebellum.

No other new lesions are present.

Dural-based lesion of the right frontal convexity is stable,
measuring 23 x 6 mm. A 4 mm dural-based lesion on the left is stable
at the same level. A slightly more posterior lesion on right is
stable. There is restricted diffusion associated with the largest
dural-based lesion.

Periventricular white matter changes are otherwise within for age.

Acute hemorrhage present. No acute infarct is present. The
ventricles are of normal size. No significant extraaxial fluid
collection is present.

Brainstem is unremarkable.

Vascular: Normal flow voids.

Skull and upper cervical spine: Progressive signal changes are
present in the C2 vertebral body compatible metastatic disease. No
other definite osseous lesions are present.

Sinuses/Orbits: The globes and orbits are within normal limits. Mild
mucosal thickening is present inferior maxillary sinuses. The
paranasal sinuses and mastoid air cells are otherwise clear.
IMPRESSION: 1. Interval increase in size of bilateral cerebellar lesions,
concerning for progression of metastatic disease.
2. No other new lesions.
3. Stable dural-based lesions of the right frontal convexity and
left cerebellum consistent with meningiomas.
4. Progressive signal changes in the C2 vertebral body compatible
with metastatic disease.

## 2021-04-16 MED ORDER — GADOBENATE DIMEGLUMINE 529 MG/ML IV SOLN
13.0000 mL | Freq: Once | INTRAVENOUS | Status: AC | PRN
  Administered 2021-04-16: 13 mL via INTRAVENOUS

## 2021-04-19 ENCOUNTER — Telehealth: Payer: Self-pay | Admitting: *Deleted

## 2021-04-19 ENCOUNTER — Other Ambulatory Visit: Payer: Self-pay | Admitting: Internal Medicine

## 2021-04-19 ENCOUNTER — Other Ambulatory Visit: Payer: Self-pay | Admitting: Physician Assistant

## 2021-04-19 ENCOUNTER — Inpatient Hospital Stay: Payer: Medicare PPO | Admitting: Internal Medicine

## 2021-04-19 ENCOUNTER — Inpatient Hospital Stay: Payer: Medicare PPO | Attending: Internal Medicine

## 2021-04-19 ENCOUNTER — Ambulatory Visit: Payer: Medicare PPO | Admitting: Internal Medicine

## 2021-04-19 DIAGNOSIS — Z79899 Other long term (current) drug therapy: Secondary | ICD-10-CM | POA: Insufficient documentation

## 2021-04-19 DIAGNOSIS — C7931 Secondary malignant neoplasm of brain: Secondary | ICD-10-CM | POA: Insufficient documentation

## 2021-04-19 DIAGNOSIS — R5383 Other fatigue: Secondary | ICD-10-CM | POA: Insufficient documentation

## 2021-04-19 DIAGNOSIS — R059 Cough, unspecified: Secondary | ICD-10-CM | POA: Insufficient documentation

## 2021-04-19 DIAGNOSIS — Z87891 Personal history of nicotine dependence: Secondary | ICD-10-CM | POA: Insufficient documentation

## 2021-04-19 DIAGNOSIS — Z5111 Encounter for antineoplastic chemotherapy: Secondary | ICD-10-CM | POA: Insufficient documentation

## 2021-04-19 DIAGNOSIS — F32A Depression, unspecified: Secondary | ICD-10-CM | POA: Insufficient documentation

## 2021-04-19 DIAGNOSIS — C3492 Malignant neoplasm of unspecified part of left bronchus or lung: Secondary | ICD-10-CM | POA: Insufficient documentation

## 2021-04-19 DIAGNOSIS — C7951 Secondary malignant neoplasm of bone: Secondary | ICD-10-CM | POA: Insufficient documentation

## 2021-04-19 NOTE — Telephone Encounter (Signed)
Patients wife called to report that since he had MRI he has had headache and has been sleeping almost for 24 hours.  She states that he is unable to get him to come for his appointments.  [name] very agitated with her and will not come to the visit.  She has tried tylenol without success.  Patient thinks the headache is a result of the MRI.  She also stated that he would probably not make his appointment with Dr Julien Nordmann on 04/28/2021.    She is asking if something other than Tylenol would be helpful.  Pharmacy [facility].

## 2021-04-20 ENCOUNTER — Other Ambulatory Visit: Payer: Self-pay | Admitting: Internal Medicine

## 2021-04-20 ENCOUNTER — Telehealth: Payer: Self-pay | Admitting: Medical Oncology

## 2021-04-20 ENCOUNTER — Other Ambulatory Visit: Payer: Self-pay

## 2021-04-20 ENCOUNTER — Other Ambulatory Visit: Payer: Self-pay | Admitting: Physician Assistant

## 2021-04-20 MED ORDER — DEXAMETHASONE 4 MG PO TABS: 4.0000 mg | ORAL_TABLET | Freq: Two times a day (BID) | ORAL | 0 refills | Status: AC

## 2021-04-20 NOTE — Telephone Encounter (Signed)
Per Dr Julien Nordmann ,I told Phillip Franklin to stop the folic acid. Emotional support given as she was crying about his prognosis . She did get the Decadron for his cerebellar mets and will give it to him when he wakes up today .

## 2021-04-20 NOTE — Telephone Encounter (Signed)
Updated Renee Ramus at [facility] . She has seen pt and wife and she will schedule a visit soon.

## 2021-04-20 NOTE — Addendum Note (Signed)
Addended by: Ardeen Garland on: 04/20/2021 04:02 PM   Modules accepted: Orders

## 2021-04-28 ENCOUNTER — Inpatient Hospital Stay: Payer: Medicare PPO

## 2021-04-28 ENCOUNTER — Other Ambulatory Visit: Payer: Self-pay

## 2021-04-28 ENCOUNTER — Other Ambulatory Visit: Payer: Medicare PPO

## 2021-04-28 ENCOUNTER — Ambulatory Visit: Payer: Medicare PPO | Admitting: Internal Medicine

## 2021-04-28 ENCOUNTER — Other Ambulatory Visit: Payer: Self-pay | Admitting: Physician Assistant

## 2021-04-28 VITALS — BP 128/53 | HR 85 | Temp 98.8°F | Resp 18 | Wt 134.5 lb

## 2021-04-28 DIAGNOSIS — C3492 Malignant neoplasm of unspecified part of left bronchus or lung: Secondary | ICD-10-CM | POA: Diagnosis not present

## 2021-04-28 DIAGNOSIS — R059 Cough, unspecified: Secondary | ICD-10-CM | POA: Diagnosis not present

## 2021-04-28 DIAGNOSIS — Z79899 Other long term (current) drug therapy: Secondary | ICD-10-CM | POA: Diagnosis not present

## 2021-04-28 DIAGNOSIS — C7951 Secondary malignant neoplasm of bone: Secondary | ICD-10-CM | POA: Diagnosis not present

## 2021-04-28 DIAGNOSIS — R5383 Other fatigue: Secondary | ICD-10-CM | POA: Diagnosis not present

## 2021-04-28 DIAGNOSIS — Z5111 Encounter for antineoplastic chemotherapy: Secondary | ICD-10-CM | POA: Diagnosis not present

## 2021-04-28 DIAGNOSIS — F32A Depression, unspecified: Secondary | ICD-10-CM | POA: Diagnosis not present

## 2021-04-28 DIAGNOSIS — Z87891 Personal history of nicotine dependence: Secondary | ICD-10-CM | POA: Diagnosis not present

## 2021-04-28 DIAGNOSIS — C7931 Secondary malignant neoplasm of brain: Secondary | ICD-10-CM | POA: Diagnosis not present

## 2021-04-28 DIAGNOSIS — E876 Hypokalemia: Secondary | ICD-10-CM

## 2021-04-28 LAB — CBC WITH DIFFERENTIAL (CANCER CENTER ONLY)
Abs Immature Granulocytes: 0.12 10*3/uL — ABNORMAL HIGH (ref 0.00–0.07)
Basophils Absolute: 0 10*3/uL (ref 0.0–0.1)
Basophils Relative: 0 %
Eosinophils Absolute: 0.3 10*3/uL (ref 0.0–0.5)
Eosinophils Relative: 3 %
HCT: 34 % — ABNORMAL LOW (ref 39.0–52.0)
Hemoglobin: 10.6 g/dL — ABNORMAL LOW (ref 13.0–17.0)
Immature Granulocytes: 1 %
Lymphocytes Relative: 18 %
Lymphs Abs: 1.7 10*3/uL (ref 0.7–4.0)
MCH: 29.2 pg (ref 26.0–34.0)
MCHC: 31.2 g/dL (ref 30.0–36.0)
MCV: 93.7 fL (ref 80.0–100.0)
Monocytes Absolute: 1 10*3/uL (ref 0.1–1.0)
Monocytes Relative: 11 %
Neutro Abs: 5.9 10*3/uL (ref 1.7–7.7)
Neutrophils Relative %: 67 %
Platelet Count: 156 10*3/uL (ref 150–400)
RBC: 3.63 MIL/uL — ABNORMAL LOW (ref 4.22–5.81)
RDW: 19.3 % — ABNORMAL HIGH (ref 11.5–15.5)
WBC Count: 9 10*3/uL (ref 4.0–10.5)
nRBC: 0 % (ref 0.0–0.2)

## 2021-04-28 LAB — CMP (CANCER CENTER ONLY)
ALT: 45 U/L — ABNORMAL HIGH (ref 0–44)
AST: 31 U/L (ref 15–41)
Albumin: 2.6 g/dL — ABNORMAL LOW (ref 3.5–5.0)
Alkaline Phosphatase: 129 U/L — ABNORMAL HIGH (ref 38–126)
Anion gap: 14 (ref 5–15)
BUN: 7 mg/dL — ABNORMAL LOW (ref 8–23)
CO2: 24 mmol/L (ref 22–32)
Calcium: 9.1 mg/dL (ref 8.9–10.3)
Chloride: 104 mmol/L (ref 98–111)
Creatinine: 0.84 mg/dL (ref 0.61–1.24)
GFR, Estimated: >60 mL/min (ref >60)
Glucose, Bld: 135 mg/dL — ABNORMAL HIGH (ref 70–99)
Potassium: 2.8 mmol/L — ABNORMAL LOW (ref 3.5–5.1)
Sodium: 142 mmol/L (ref 135–145)
Total Bilirubin: 0.4 mg/dL (ref 0.3–1.2)
Total Protein: 6.3 g/dL — ABNORMAL LOW (ref 6.5–8.1)

## 2021-04-28 LAB — TSH: TSH: 0.475 u[IU]/mL (ref 0.320–4.118)

## 2021-04-28 MED ORDER — SODIUM CHLORIDE 0.9 % IV SOLN
1000.0000 mg/m2 | Freq: Once | INTRAVENOUS | Status: AC
  Administered 2021-04-28: 1710 mg via INTRAVENOUS
  Filled 2021-04-28: qty 44.97

## 2021-04-28 MED ORDER — POTASSIUM CHLORIDE CRYS ER 20 MEQ PO TBCR: 20.0000 meq | EXTENDED_RELEASE_TABLET | Freq: Two times a day (BID) | ORAL | 0 refills | Status: AC

## 2021-04-28 MED ORDER — SODIUM CHLORIDE 0.9 % IV SOLN
Freq: Once | INTRAVENOUS | Status: AC
  Filled 2021-04-28: qty 250

## 2021-04-28 MED ORDER — PROCHLORPERAZINE MALEATE 10 MG PO TABS
10.0000 mg | ORAL_TABLET | Freq: Once | ORAL | Status: AC
  Administered 2021-04-28: 10 mg via ORAL
  Filled 2021-04-28: qty 1

## 2021-04-28 MED ORDER — SODIUM CHLORIDE 0.9 % IV SOLN
Freq: Once | INTRAVENOUS | Status: DC
  Filled 2021-04-28: qty 250

## 2021-04-28 NOTE — Patient Instructions (Signed)
[facility]  Discharge Instructions: Thank you for choosing [facility] to provide your oncology and hematology care.   If you have a lab appointment with the [facility], please go directly to the [facility] and check in at the registration area.   Wear comfortable clothing and clothing appropriate for easy access to any Portacath or PICC line.   We strive to give you quality time with your provider. You may need to reschedule your appointment if you arrive late (15 or more minutes).  Arriving late affects you and other patients whose appointments are after yours.  Also, if you miss three or more appointments without notifying the office, you may be dismissed from the clinic at the provider's discretion.      For prescription refill requests, have your pharmacy contact our office and allow 72 hours for refills to be completed.    Today you received the following chemotherapy and/or immunotherapy agents Gemzar    To help prevent nausea and vomiting after your treatment, we encourage you to take your nausea medication as directed.  BELOW ARE SYMPTOMS THAT SHOULD BE REPORTED IMMEDIATELY: *FEVER GREATER THAN 100.4 F (38 C) OR HIGHER *CHILLS OR SWEATING *NAUSEA AND VOMITING THAT IS NOT CONTROLLED WITH YOUR NAUSEA MEDICATION *UNUSUAL SHORTNESS OF BREATH *UNUSUAL BRUISING OR BLEEDING *URINARY PROBLEMS (pain or burning when urinating, or frequent urination) *BOWEL PROBLEMS (unusual diarrhea, constipation, pain near the anus) TENDERNESS IN MOUTH AND THROAT WITH OR WITHOUT PRESENCE OF ULCERS (sore throat, sores in mouth, or a toothache) UNUSUAL RASH, SWELLING OR PAIN  UNUSUAL VAGINAL DISCHARGE OR ITCHING   Items with * indicate a potential emergency and should be followed up as soon as possible or go to the Emergency Department if any problems should occur.  Please show the CHEMOTHERAPY ALERT CARD or IMMUNOTHERAPY ALERT CARD at check-in to the  Emergency Department and triage nurse.  Should you have questions after your visit or need to cancel or reschedule your appointment, please contact [facility]  Dept: [phone]  and follow the prompts.  Office hours are 8:00 a.m. to 4:30 p.m. Monday - Friday. Please note that voicemails left after 4:00 p.m. may not be returned until the following business day.  We are closed weekends and major holidays. You have access to a nurse at all times for urgent questions. Please call the main number to the clinic Dept: [phone] and follow the prompts.   For any non-urgent questions, you may also contact your provider using MyChart. We now offer e-Visits for anyone 48 and older to request care online for non-urgent symptoms. For details visit [email or web address].   Also download the MyChart app! Go to the app store, search "MyChart", open the app, select [facility], and log in with your MyChart username and password.  Due to Covid, a mask is required upon entering the hospital/clinic. If you do not have a mask, one will be given to you upon arrival. For doctor visits, patients may have 1 support person aged 34 or older with them. For treatment visits, patients cannot have anyone with them due to current Covid guidelines and our immunocompromised population.   Gemcitabine injection What is this medication? GEMCITABINE (jem SYE ta been) is a chemotherapy drug. This medicine is used to treat many types of cancer like breast cancer, lung cancer, pancreatic cancer,and ovarian cancer. This medicine may be used for other purposes; ask your health care provider orpharmacist if you have  questions. COMMON BRAND NAME(S): Gemzar, Infugem What should I tell my care team before I take this medication? They need to know if you have any of these conditions: blood disorders infection kidney disease liver disease lung or breathing disease, like asthma recent or ongoing radiation  therapy an unusual or allergic reaction to gemcitabine, other chemotherapy, other medicines, foods, dyes, or preservatives pregnant or trying to get pregnant breast-feeding How should I use this medication? This drug is given as an infusion into a vein. It is administered in a hospitalor clinic by a specially trained health care professional. Talk to your pediatrician regarding the use of this medicine in children.Special care may be needed. Overdosage: If you think you have taken too much of this medicine contact apoison control center or emergency room at once. NOTE: This medicine is only for you. Do not share this medicine with others. What if I miss a dose? It is important not to miss your dose. Call your doctor or health careprofessional if you are unable to keep an appointment. What may interact with this medication? medicines to increase blood counts like filgrastim, pegfilgrastim, sargramostim some other chemotherapy drugs like cisplatin vaccines Talk to your doctor or health care professional before taking any of thesemedicines: acetaminophen aspirin ibuprofen ketoprofen naproxen This list may not describe all possible interactions. Give your health care provider a list of all the medicines, herbs, non-prescription drugs, or dietary supplements you use. Also tell them if you smoke, drink alcohol, or use illegaldrugs. Some items may interact with your medicine. What should I watch for while using this medication? Visit your doctor for checks on your progress. This drug may make you feel generally unwell. This is not uncommon, as chemotherapy can affect healthy cells as well as cancer cells. Report any side effects. Continue your course oftreatment even though you feel ill unless your doctor tells you to stop. In some cases, you may be given additional medicines to help with side effects.Follow all directions for their use. Call your doctor or health care professional for advice if  you get a fever, chills or sore throat, or other symptoms of a cold or flu. Do not treat yourself. This drug decreases your body's ability to fight infections. Try toavoid being around people who are sick. This medicine may increase your risk to bruise or bleed. Call your doctor orhealth care professional if you notice any unusual bleeding. Be careful brushing and flossing your teeth or using a toothpick because you may get an infection or bleed more easily. If you have any dental work done,tell your dentist you are receiving this medicine. Avoid taking products that contain aspirin, acetaminophen, ibuprofen, naproxen, or ketoprofen unless instructed by your doctor. These medicines may hide afever. Do not become pregnant while taking this medicine or for 6 months after stopping it. Women should inform their doctor if they wish to become pregnant or think they might be pregnant. Men should not father a child while taking this medicine and for 3 months after stopping it. There is a potential for serious side effects to an unborn child. Talk to your health care professional or pharmacist for more information. Do not breast-feed an infant while takingthis medicine or for at least 1 week after stopping it. Men should inform their doctors if they wish to father a child. This medicine may lower sperm counts. Talk with your doctor or health care professional ifyou are concerned about your fertility. What side effects may I notice from receiving this medication?  Side effects that you should report to your doctor or health care professionalas soon as possible: allergic reactions like skin rash, itching or hives, swelling of the face, lips, or tongue breathing problems pain, redness, or irritation at site where injected signs and symptoms of a dangerous change in heartbeat or heart rhythm like chest pain; dizziness; fast or irregular heartbeat; palpitations; feeling faint or lightheaded, falls; breathing  problems signs of decreased platelets or bleeding - bruising, pinpoint red spots on the skin, black, tarry stools, blood in the urine signs of decreased red blood cells - unusually weak or tired, feeling faint or lightheaded, falls signs of infection - fever or chills, cough, sore throat, pain or difficulty passing urine signs and symptoms of kidney injury like trouble passing urine or change in the amount of urine signs and symptoms of liver injury like dark yellow or brown urine; general ill feeling or flu-like symptoms; light-colored stools; loss of appetite; nausea; right upper belly pain; unusually weak or tired; yellowing of the eyes or skin swelling of ankles, feet, hands Side effects that usually do not require medical attention (report to yourdoctor or health care professional if they continue or are bothersome): constipation diarrhea hair loss loss of appetite nausea rash vomiting This list may not describe all possible side effects. Call your doctor for medical advice about side effects. You may report side effects to FDA at1-800-FDA-1088. Where should I keep my medication? This drug is given in a hospital or clinic and will not be stored at home. NOTE: This sheet is a summary. It may not cover all possible information. If you have questions about this medicine, talk to your doctor, pharmacist, orhealth care provider.  2022 [employer] Standard (2017-11-29 18:06:11)

## 2021-04-29 ENCOUNTER — Telehealth: Payer: Self-pay

## 2021-04-29 ENCOUNTER — Other Ambulatory Visit: Payer: Self-pay | Admitting: Physician Assistant

## 2021-04-29 NOTE — Telephone Encounter (Signed)
-----   Message from [employer], PA-C sent at 04/28/2021  1:59 PM EDT ----- Can you call his wife and let her know I sent him a prescription for potassium to his pharmacy. ----- Message ----- From: [place]: 04/28/2021   1:28 PM EDT To: Tobe Sos Heilingoetter, PA-C

## 2021-04-29 NOTE — Telephone Encounter (Signed)
Pt and his wife was present in the office 04/28/21 and was advised of this by Arnoldo Lenis, RN.

## 2021-04-30 ENCOUNTER — Telehealth: Payer: Self-pay | Admitting: Medical Oncology

## 2021-04-30 NOTE — Telephone Encounter (Signed)
Gonzella Lex NP is at pt house.  Pt temp is 100.4 temporal.  Resp 28/min  BP 106/62 Pulse -108/reg. Sats 96%.  Wife said he wants to sleep and she will take him to ED in 30 mins.

## 2021-04-30 NOTE — Telephone Encounter (Signed)
[facility] called and said wife told her pt had temp of 101.4 today .  I called [name] and she gave him tylenol 500 mg. Temp now 98 f.  [name]  "restful and halfway sleep now".   Per Dr Julien Nordmann I told [name] that if he has a fever of 100. 5 or higher , shaking chills with/without fever , confusion , unable to arouse or any other concerns then he needs to be evaluated at  ED.

## 2021-05-03 ENCOUNTER — Telehealth: Payer: Self-pay | Admitting: Medical Oncology

## 2021-05-03 NOTE — Telephone Encounter (Signed)
Pt update-  " this is just an update . Phillip Franklin did not go to ED last Friday.  He elected to stay home".   His temp went down on Saturday . He has periodic short nose bleeds since Thursday through Sunday. [name] not eating , he is tired  and needs assistance to get off the bed and go to bathroom.

## 2021-05-03 NOTE — Telephone Encounter (Signed)
I spoke with pts wife. She shared that Phillip Franklin was very tired, had a lot of "bathroom accidents", and felt shaky requiring assistance to get out of the bed and to the bathroom after his last tx. She wanted to know if the pt chooses not to have the next tx, does it mean he cannot do anymore tx's. I advised they always have a choice to accept tx or decline if that day or continuously but this is something to discuss in greater detail during his appts. She states she will let us know if he wants to come to his next appt on 05/12/21 a little closer to that date. She had no further complaints of nose bleeds and his eating is at his baseline.

## 2021-05-08 ENCOUNTER — Encounter: Payer: Self-pay | Admitting: Internal Medicine

## 2021-05-10 ENCOUNTER — Telehealth: Payer: Self-pay | Admitting: Medical Oncology

## 2021-05-10 NOTE — Telephone Encounter (Signed)
Wife called and said [facility] is going [facility] .   Jasmine @ [facility] called and asked if  Dr. Julien Nordmann will be attending?   Jasmine @ [facility] notified that Julien Nordmann will be attending.

## 2021-05-12 ENCOUNTER — Inpatient Hospital Stay: Payer: Medicare PPO

## 2021-05-12 ENCOUNTER — Inpatient Hospital Stay: Payer: Medicare PPO | Admitting: Physician Assistant

## 2021-05-19 ENCOUNTER — Other Ambulatory Visit: Payer: Medicare PPO

## 2021-05-19 ENCOUNTER — Ambulatory Visit: Payer: Medicare PPO | Admitting: Internal Medicine

## 2021-05-19 ENCOUNTER — Ambulatory Visit: Payer: Medicare PPO

## 2021-06-02 ENCOUNTER — Ambulatory Visit: Payer: Medicare PPO | Admitting: Internal Medicine

## 2021-06-02 ENCOUNTER — Ambulatory Visit: Payer: Medicare PPO

## 2021-06-02 ENCOUNTER — Other Ambulatory Visit: Payer: Medicare PPO

## 2021-06-09 ENCOUNTER — Ambulatory Visit: Payer: Medicare PPO

## 2021-06-09 ENCOUNTER — Other Ambulatory Visit: Payer: Medicare PPO

## 2021-06-18 ENCOUNTER — Telehealth: Payer: Self-pay

## 2021-06-18 NOTE — Telephone Encounter (Signed)
Fax received advising pt passed away 07-02-21 at 4:12am

## 2021-06-19 DEATH — deceased

## 2021-06-24 ENCOUNTER — Telehealth: Payer: Self-pay | Admitting: Medical Oncology

## 2021-06-24 NOTE — Telephone Encounter (Signed)
Question re Death certification.
